# Patient Record
Sex: Male | Born: 1935 | Race: White | Hispanic: No | Marital: Married | Smoking: Former smoker
Health system: Southern US, Community
[De-identification: ages and names within clinical notes are randomized; demographics above are authoritative.]

## PROBLEM LIST (undated history)

## (undated) DIAGNOSIS — K219 Gastro-esophageal reflux disease without esophagitis: Secondary | ICD-10-CM

## (undated) DIAGNOSIS — H3530 Unspecified macular degeneration: Secondary | ICD-10-CM

## (undated) DIAGNOSIS — C801 Malignant (primary) neoplasm, unspecified: Secondary | ICD-10-CM

## (undated) DIAGNOSIS — G9389 Other specified disorders of brain: Secondary | ICD-10-CM

## (undated) DIAGNOSIS — J189 Pneumonia, unspecified organism: Secondary | ICD-10-CM

## (undated) DIAGNOSIS — I2510 Atherosclerotic heart disease of native coronary artery without angina pectoris: Secondary | ICD-10-CM

## (undated) DIAGNOSIS — C3492 Malignant neoplasm of unspecified part of left bronchus or lung: Secondary | ICD-10-CM

## (undated) DIAGNOSIS — I10 Essential (primary) hypertension: Secondary | ICD-10-CM

## (undated) DIAGNOSIS — S069X9A Unspecified intracranial injury with loss of consciousness of unspecified duration, initial encounter: Secondary | ICD-10-CM

## (undated) DIAGNOSIS — I219 Acute myocardial infarction, unspecified: Secondary | ICD-10-CM

## (undated) DIAGNOSIS — E119 Type 2 diabetes mellitus without complications: Secondary | ICD-10-CM

## (undated) DIAGNOSIS — N400 Enlarged prostate without lower urinary tract symptoms: Secondary | ICD-10-CM

## (undated) DIAGNOSIS — R222 Localized swelling, mass and lump, trunk: Secondary | ICD-10-CM

## (undated) DIAGNOSIS — E785 Hyperlipidemia, unspecified: Secondary | ICD-10-CM

## (undated) DIAGNOSIS — M1990 Unspecified osteoarthritis, unspecified site: Secondary | ICD-10-CM

## (undated) HISTORY — DX: Malignant neoplasm of unspecified part of left bronchus or lung: C34.92

## (undated) HISTORY — PX: CORONARY ARTERY BYPASS GRAFT: SHX141

## (undated) HISTORY — DX: Gastro-esophageal reflux disease without esophagitis: K21.9

## (undated) HISTORY — DX: Other specified disorders of brain: G93.89

## (undated) HISTORY — PX: COLONOSCOPY W/ POLYPECTOMY: SHX1380

## (undated) HISTORY — DX: Benign prostatic hyperplasia without lower urinary tract symptoms: N40.0

## (undated) HISTORY — DX: Type 2 diabetes mellitus without complications: E11.9

## (undated) HISTORY — DX: Acute myocardial infarction, unspecified: I21.9

## (undated) HISTORY — DX: Essential (primary) hypertension: I10

## (undated) HISTORY — DX: Hyperlipidemia, unspecified: E78.5

## (undated) HISTORY — PX: CARDIAC CATHETERIZATION: SHX172

## (undated) HISTORY — DX: Localized swelling, mass and lump, trunk: R22.2

## (undated) HISTORY — DX: Atherosclerotic heart disease of native coronary artery without angina pectoris: I25.10

---

## 2007-07-23 ENCOUNTER — Ambulatory Visit: Payer: Self-pay | Admitting: Cardiothoracic Surgery

## 2007-08-06 ENCOUNTER — Ambulatory Visit: Payer: Self-pay | Admitting: Thoracic Surgery (Cardiothoracic Vascular Surgery)

## 2007-09-05 ENCOUNTER — Ambulatory Visit: Payer: Self-pay | Admitting: Thoracic Surgery (Cardiothoracic Vascular Surgery)

## 2015-05-31 DIAGNOSIS — E1142 Type 2 diabetes mellitus with diabetic polyneuropathy: Secondary | ICD-10-CM | POA: Insufficient documentation

## 2015-05-31 DIAGNOSIS — K219 Gastro-esophageal reflux disease without esophagitis: Secondary | ICD-10-CM | POA: Insufficient documentation

## 2015-05-31 DIAGNOSIS — I2510 Atherosclerotic heart disease of native coronary artery without angina pectoris: Secondary | ICD-10-CM | POA: Insufficient documentation

## 2015-05-31 DIAGNOSIS — J439 Emphysema, unspecified: Secondary | ICD-10-CM | POA: Insufficient documentation

## 2016-08-03 DIAGNOSIS — R278 Other lack of coordination: Secondary | ICD-10-CM | POA: Insufficient documentation

## 2016-08-04 DIAGNOSIS — E119 Type 2 diabetes mellitus without complications: Secondary | ICD-10-CM | POA: Insufficient documentation

## 2016-08-04 DIAGNOSIS — C7931 Secondary malignant neoplasm of brain: Secondary | ICD-10-CM | POA: Insufficient documentation

## 2016-08-04 DIAGNOSIS — I10 Essential (primary) hypertension: Secondary | ICD-10-CM | POA: Insufficient documentation

## 2016-08-04 DIAGNOSIS — E785 Hyperlipidemia, unspecified: Secondary | ICD-10-CM | POA: Insufficient documentation

## 2016-08-05 HISTORY — PX: CRANIECTOMY FOR EXCISION OF BRAIN TUMOR INFRATENTORIAL / POSTERIOR FOSSA: SUR325

## 2016-08-07 HISTORY — PX: FLEXIBLE BRONCHOSCOPY: SUR164

## 2016-08-08 DIAGNOSIS — R1312 Dysphagia, oropharyngeal phase: Secondary | ICD-10-CM | POA: Insufficient documentation

## 2016-08-08 DIAGNOSIS — R918 Other nonspecific abnormal finding of lung field: Secondary | ICD-10-CM | POA: Insufficient documentation

## 2016-09-04 DIAGNOSIS — N400 Enlarged prostate without lower urinary tract symptoms: Secondary | ICD-10-CM | POA: Insufficient documentation

## 2016-09-05 DIAGNOSIS — G9340 Encephalopathy, unspecified: Secondary | ICD-10-CM | POA: Insufficient documentation

## 2016-09-09 DIAGNOSIS — R531 Weakness: Secondary | ICD-10-CM | POA: Insufficient documentation

## 2016-09-09 DIAGNOSIS — E871 Hypo-osmolality and hyponatremia: Secondary | ICD-10-CM | POA: Insufficient documentation

## 2016-10-12 ENCOUNTER — Institutional Professional Consult (permissible substitution) (INDEPENDENT_AMBULATORY_CARE_PROVIDER_SITE_OTHER): Payer: Medicare Other | Admitting: Cardiothoracic Surgery

## 2016-10-12 ENCOUNTER — Other Ambulatory Visit: Payer: Self-pay

## 2016-10-12 ENCOUNTER — Other Ambulatory Visit: Payer: Self-pay | Admitting: *Deleted

## 2016-10-12 VITALS — BP 117/66 | HR 48 | Resp 20 | Ht 71.0 in | Wt 190.0 lb

## 2016-10-12 DIAGNOSIS — R918 Other nonspecific abnormal finding of lung field: Secondary | ICD-10-CM | POA: Diagnosis not present

## 2016-10-12 DIAGNOSIS — Z951 Presence of aortocoronary bypass graft: Secondary | ICD-10-CM

## 2016-10-12 NOTE — Progress Notes (Signed)
[address]       [place] [address]             [id number]                    Ever Aziz Kosse Medical Record #[id number] Date of Birth: 1931/04/28  Referring: Vincente Liberty, MD Primary Care: [name], MD  Chief Complaint:    Chief Complaint  Patient presents with  . Lung Mass    Surgical eval PET Scan 09/20/16 @ [facility], Chest/Abd/Pelvis CT 08/04/16 @ [facility], MRI Head 08/06/16 @ [place]    History of Present Illness:    Steve Frank 83 y.o. male is seen in the office  today for Abnormal brain scan and chest scan and PET scan done over the last several months. The patient originally presented to the emergency room in [facility] with ataxia, was found to have a mass in the left cerebellar area. Apparently this was partially resected but no definitive diagnosis was made. The patient continues to have neurologic problems with ataxia difficulty walking. Follow-up CT scan PET scan suggests mediastinal adenopathy. Attempted a needle biopsy of a left supraclavicular node was unsuccessful as there was no ultrasound correlate to biopsy. The patient is referred for consideration of bronchoscopy with ebus. With the patient's poor medical condition he has definite not it candidate for video-assisted thoracoscopy. Previous biopsy done in [facility] did not reveal a tissue diagnosis of what is suspected to be stage IV lung cancer.      Current Activity/ Functional Status:  Patient is not independent with mobility/ambulation, transfers, ADL's, IADL's.   Zubrod Score: At the time of surgery this patient's most appropriate activity status/level should be described as: _0     0    Normal activity, no symptoms _1     1    Restricted in physical strenuous activity but ambulatory, able to do out light work _2     2    Ambulatory and capable of self care, unable to do work activities, up and about               >50 % of waking hours                              _3     3     Only limited self care, in bed greater than 50% of waking hours _4     4    Completely disabled, no self care, confined to bed or chair _5     5    Moribund   Past Medical History:  Diagnosis Date  . BPH (benign prostatic hyperplasia)   . CAD (coronary artery disease)    s/p 2 cabg's  . Cerebellar mass   . Chest mass   . Diabetes ([place])   . GERD (gastroesophageal reflux disease)   . Heart attack ([place])   . HLD (hyperlipidemia)   . Hypertension     Past Surgical History:  Procedure Laterality Date  . CORONARY ARTERY BYPASS GRAFT     Dr Prescott [facility] 07/2007  . CORONARY ARTERY BYPASS GRAFT     1st one was @ [facility]years ago 12/1991  . CRANIECTOMY FOR EXCISION OF BRAIN TUMOR INFRATENTORIAL / POSTERIOR FOSSA  08/05/2016   Dr Rebecka Apley  @ [facility]/Neurosurgery  . FLEXIBLE BRONCHOSCOPY  08/07/2016   Dr Gwenevere Ghazi    No family history on  file.  Social History   Social History  . Marital status: Married    Spouse name: N/A  . Number of children: 4  . Years of education: N/A   Occupational History  . retired    Social History Main Topics  . Smoking status: Former Research scientist (life sciences)  . Smokeless tobacco: Former Systems developer     Comment: Quit 31 years ago  . Alcohol use No  . Drug use: No  . Sexual activity: Not Currently   Other Topics Concern  . Not on file   Social History Narrative  . No narrative on file    History  Smoking Status  . Former Smoker  Smokeless Tobacco  . Former Systems developer    Comment: Quit 31 years ago    History  Alcohol Use No     Allergies  Allergen Reactions  . Ciprofloxacin Nausea And Vomiting    Current Outpatient Prescriptions  Medication Sig Dispense Refill  . acetaminophen (TYLENOL) 325 MG tablet Take 650 mg by mouth every 6 (six) hours as needed (for pain/headaches.).     Marland Kitchen albuterol (PROVENTIL HFA;VENTOLIN HFA) 108 (90 Base) MCG/ACT inhaler Inhale 2 puffs into the lungs every 6 (six) hours as needed (for wheezing/shortness of breath).       Marland Kitchen amLODipine (NORVASC) 5 MG tablet Take 5 mg by mouth daily.     . carvedilol (COREG) 25 MG tablet Take 25 mg by mouth 2 (two) times daily with a meal.     . Cholecalciferol (VITAMIN D-1000 MAX ST) 1000 units tablet Take 1,000 Units by mouth daily.     . furosemide (LASIX) 20 MG tablet Take 20 mg by mouth daily.  0  . HUMALOG 100 UNIT/ML injection INJECT 2 TO 0.12MLS (2-8 UNITS) UNDER THE SKIN 3 TIMES A DAY BEFORE MEALS, AS PER SLIDING SCALE  0  . LANTUS 100 UNIT/ML injection INJECT 0.15ML (15 UNITS TOTAL) UNDER THE SKIN DAILY AT BEDTIME.  0  . mirtazapine (REMERON) 15 MG tablet Take 15 mg by mouth at bedtime.  0  . omeprazole (PRILOSEC) 20 MG capsule Take 20 mg by mouth at bedtime.     . polyethylene glycol (MIRALAX / GLYCOLAX) packet Take 17 g by mouth daily as needed (for constipation.).     Marland Kitchen atorvastatin (LIPITOR) 10 MG tablet take 1 tablet by mouth every evening    . clotrimazole-betamethasone (LOTRISONE) cream Apply 1 application topically 2 (two) times daily.    Marland Kitchen docusate sodium (COLACE) 100 MG capsule Take 100 mg by mouth 2 (two) times daily as needed (for constipation.).    Marland Kitchen gabapentin (NEURONTIN) 400 MG capsule Take 400 mg by mouth at bedtime.    . magnesium hydroxide (MILK OF MAGNESIA) 400 MG/5ML suspension Take 30 mLs by mouth daily as needed (for constipation.).    Marland Kitchen Multiple Vitamin (MULTIVITAMIN WITH MINERALS) TABS tablet Take 1 tablet by mouth daily.    Marland Kitchen neomycin-bacitracin-polymyxin (NEOSPORIN) [id number] ointment Apply 1 application topically at bedtime.    . tamsulosin (FLOMAX) 0.4 MG CAPS capsule Take 0.4 mg by mouth at bedtime.     No current facility-administered medications for this visit.     Pertinent items are noted in HPI.   Review of Systems:     Cardiac Review of Systems: Y or N  Chest Pain [    ]  Resting SOB [   ] Exertional SOB  [  ]  Orthopnea [  ]   Pedal Edema [   ]  Palpitations [  ] Syncope  [  ]   Presyncope [   ]  General Review of Systems:  [Y] = yes [  ]=no Constitional: recent weight change [  ];  Wt loss over the last 3 months [   ] anorexia [  ]; fatigue [  ]; nausea [  ]; night sweats [  ]; fever [  ]; or chills [  ];          Dental: poor dentition[  ]; Last Dentist visit:   Eye : blurred vision [  ]; diplopia [   ]; vision changes [  ];  Amaurosis fugax[  ]; Resp: cough [  ];  wheezing[  ];  hemoptysis[  ]; shortness of breath[  ]; paroxysmal nocturnal dyspnea[  ]; dyspnea on exertion[  ]; or orthopnea[  ];  GI:  gallstones[  ], vomiting[  ];  dysphagia[  ]; melena[  ];  hematochezia [  ]; heartburn[  ];   Hx of  Colonoscopy[  ]; GU: kidney stones [  ]; hematuria[  ];   dysuria [  ];  nocturia[  ];  history of     obstruction [  ]; urinary frequency [  ]             Skin: rash, swelling[  ];, hair loss[  ];  peripheral edema[  ];  or itching[  ]; Musculosketetal: myalgias[  ];  joint swelling[  ];  joint erythema[  ];  joint pain[  ];  back pain[  ];  Heme/Lymph: bruising[  ];  bleeding[  ];  anemia[  ];  Neuro: TIA[  ];  headaches[  ];  stroke[  ];  vertigo[  ];  seizures[  ];   paresthesias[  ];  difficulty walking[  ];  Psych:depression[  ]; anxiety[  ];  Endocrine: diabetes[  ];  thyroid dysfunction[  ];  Immunizations: Flu up to date [  ]; Pneumococcal up to date [  ];  Other:  Physical Exam: BP 117/66   Pulse (!) 48   Resp 20   Ht _0  (1.803 m)   Wt 190 lb (86.2 kg)   SpO2 96% Comment: RA  BMI 26.50 kg/m   PHYSICAL EXAMINATION: General appearance: alert, cooperative, appears older than stated age and slowed mentation Head: Normocephalic, without obvious abnormality, atraumatic Neck: no adenopathy, no carotid bruit, no JVD, supple, symmetrical, trachea midline and thyroid not enlarged, symmetric, no tenderness/mass/nodules Lymph nodes: Cervical, supraclavicular, and axillary nodes normal. Resp: diminished breath sounds bibasilar Back: symmetric, no curvature. ROM normal. No CVA tenderness. Cardio: regular  rate and rhythm, S1, S2 normal, no murmur, click, rub or gallop GI: soft, non-tender; bowel sounds normal; no masses,  no organomegaly Extremities: edema Bilateral lower extremity edema left greater than right patient notes this is been chronic Neurologic: Mental status: Alert, oriented, thought content appropriate Motor: Patient has some ataxia is able to walk but only with existence, came into the office in a wheelchair Coordination: Patient is able to stand but with help  Diagnostic Studies & Laboratory data:     Recent Radiology Findings:   CLINICAL DATA: Initial treatment strategy for lung carcinoma.  EXAM: NUCLEAR MEDICINE PET SKULL BASE TO THIGH  TECHNIQUE: 12.6 mCi F-18 FDG was injected intravenously. Full-ring PET imaging was performed from the skull base to thigh after the radiotracer. CT data was obtained and used for attenuation correction and anatomic localization.  FASTING BLOOD GLUCOSE: Value: 114  mg/dl  COMPARISON: PET-CT 09/08/2016  FINDINGS: NECK  Hypermetabolic lesion in the LEFT cerebellum with intense metabolic activity above physiologic brain activity. Craniotomy defect in the LEFT occipital bone  No hypermetabolic lymph nodes in the neck.  CHEST  Hypermetabolic LEFT infrahilar mass measures 2.2 cm with SUV max equal 4.0.  Hypermetabolic subcarinal and RIGHT lower paratracheal adenopathy. RIGHT lower paratracheal lymph node measures 20 mm short axis with SUV max equal 4.9.  Small hypermetabolic LEFT supraclavicular node measures 8 mm (image 88, series 3) with SUV max equal 2.3.  RIGHT upper lobe pulmonary nodule measuring 8 mm and does not have associated metabolic activity  Small bilateral pleural effusions.  ABDOMEN/PELVIS  No abnormal hypermetabolic activity within the liver, pancreas, adrenal glands, or spleen. No hypermetabolic lymph nodes in the abdomen or pelvis. Prostate grossly normal.  SKELETON  No focal hypermetabolic  activity to suggest skeletal metastasis.  IMPRESSION: 1. Hypermetabolic LEFT infrahilar mass. 2. Hypermetabolic mediastinal lymph nodes including contralateral RIGHT lower paratracheal lymph node. 3. Small hypermetabolic LEFT supraclavicular lymph node. 4. Hypermetabolic LEFT cerebellar brain metastasis.   Electronically Signed By: Suzy Bouchard M.D. On: 09/20/2016 17:01  Principal Interpreter Name: Suzy Bouchard Provider ID: [id number]  Final Report  CLINICAL DATA: Hypermetabolic LEFT infrahilar mass. Hypermetabolic mediastinal lymph nodes including contralateral RIGHT lower paratracheal lymph node. Small hypermetabolic LEFT supraclavicular lymph node. Hypermetabolic LEFT cerebellar brain metastasis.  EXAM: ULTRASOUND OF HEAD/NECK SOFT TISSUES  TECHNIQUE: Ultrasound examination of the head and neck soft tissues was performed in the area of clinical concern in anticipation of percutaneous biopsy.  COMPARISON: PET-CT 09/20/2016  FINDINGS: No definite correlate on ultrasound for the 0.8 cm left supraclavicular hypermetabolic focus seen on PET-CT. No definite pathologic adenopathy identified. Percutaneous biopsy was therefore deferred.  IMPRESSION: 1. No ultrasound correlate for left supraclavicular hypermetabolic focus on PET-CT; biopsy deferred. The findings were discussed with the patient and his spouse.   Electronically Signed By: Lucrezia Europe M.D. On: 09/30/2016 09:03  Principal Interpreter Name: Lucrezia Europe Provider ID: [email or web address] [id number] CLINICAL DATA: Followup cerebellar tumor resection  EXAM: MRI HEAD WITHOUT AND WITH CONTRAST  TECHNIQUE: Multiplanar, multiecho pulse sequences of the brain and surrounding structures were obtained without and with intravenous contrast.  CONTRAST: 18 cc MultiHance  COMPARISON: No comparison imaging available.  FINDINGS: Brain: Recent left occipital craniotomy. Resection defect present in the posterior periphery of the left  cerebellar hemisphere. Blood products along the margin of the resection but no evidence of enhancement at the margin. Round enhancing mass measuring 2.2 cm in diameter anterior to the resection in the superior left cerebellum. Vasogenic edema within the left cerebellum that could be a combination of postsurgical edema and edema related to the residual mass. Slight mass-effect upon the fourth ventricle but no ventricular obstruction. No significant extra-axial collection or unexpected extra-axial collection. Cerebral hemispheres show chronic small-vessel ischemic changes of the deep white matter but note tumor.  Vascular: Major vessels at the base of the brain show flow.  Skull and upper cervical spine: Negative  Sinuses/Orbits: Clear/normal  Other: None significant  IMPRESSION: Left occipital craniotomy with resection defect in the posterior left cerebellum. Blood products at the resection site but no marginal enhancement at the site of resection.  2.2 cm in diameter enhancing intra-axial mass lesion anterior to the resection.  Vasogenic edema in the left cerebellum probably secondary to both the surgery in the residual tumor. Mass-effect upon the fourth ventricle but no ventricular obstruction at this time.   Electronically Signed By: Nelson Chimes  M.D. On: 08/06/2016 12:29  Principal Interpreter Name: Nelson Chimes Provider ID: [id number] CLINICAL DATA: Altered mental status. Nausea and vomiting. Recent cerebellar tumor resection.  EXAM: CT HEAD WITHOUT CONTRAST  TECHNIQUE: Contiguous axial images were obtained from the base of the skull through the vertex without intravenous contrast.  COMPARISON: Brain MR dated 08/06/2016.  FINDINGS: Brain: 2.0 x 1.9 cm ring-like area of increased density in the left cerebellum on image number 10 of series 2, with central low density. This is in a region of previous residual enhancement. Low density in the adjacent portions of  the cerebellum. Diffusely enlarged ventricles and subarachnoid spaces. Patchy white matter low density in both cerebral hemispheres. No intracranial hemorrhage or mass effect. No evidence of acute infarction.  Vascular: No hyperdense vessel or unexpected calcification.  Skull: Left occipital post craniectomy changes.  Sinuses/Orbits: Unremarkable.  Other: Tiny linear anterior frontal interhemispheric lipoma.  IMPRESSION: 1. No acute abnormality. 2. Stable residual tumor in the left cerebellum with associated vasogenic and postoperative edema. 3. Mild diffuse cerebral and cerebellar atrophy. 4. Moderate chronic small vessel white matter ischemic changes in both cerebral hemispheres.   Electronically Signed By: Claudie Revering M.D. On: 09/08/2016 22:27  A. LEFT CEREBELLAR MASS, BIOPSY:  CEREBELLAR TISSUE WITH GLIOSIS, PER OUTSIDE CONSULTANT.  NEGATIVE FOR MALIGNANCY.  B. LEFT CEREBELLAR MASS, BIOPSY:  CEREBELLAR TISSUE WITH GLIOSIS, PER OUTSIDE CONSULTANT.  SPECIMEN ALSO INCLUDES ABUNDANT BLOOD AND FRAGMENTS OF BONE.  NEGATIVE FOR MALIGNANCY.  C. LEFT CEREBELLAR MASS, BIOPSY:  CEREBELLAR TISSUE WITH GLIOSIS, PER OUTSIDE CONSULTANT.  NEGATIVE FOR MALIGNANCY.      P342, 307 X2, 189    Comment The frozen section slide on specimen A revealed numerous small blue cells with focal necrosis, such that a lymphoid lesion could not be ruled out. Flow cytometry was performed on specimen B (see [facility] report) and it showed too few lymphocytes for adequate analysis indicating that the small blue cells represent cerebellar granule cells. The slides on specimens A, B and C were submitted for consultation to Dr. Letta Kocher a neuropathologist at [facility]. He interpreted the findings as cerebellar tissue with gliosis and no evidence of malignancy (see consultation report). The submitted brain biopsies may not be representative of the  cerebellar mass and further evaluation is recommended if clinically indicated.   Clinical History Pre-op diagnosis: Cerebellar tumor   OR Consultation Diagnosis LABELED "LEFT CEREBELLAR MASS", BIOPSY FOR FROZEN SECTION:  NUMEROUS SMALL BLUE CELLS WITH FOCAL NECROSIS.   LYMPHOID LESION CANNOT BE RULED OUT.   ADDITIONAL FRESH TISSUE FOR FLOW CYTOMETRY REQUESTED.   Frozen section report called to Dr. Katherine Roan in the [place] by Dr. Birdena Crandall on 08/05/16 at 6:45 pm.    Gross Description A. Received fresh for intraoperative frozen section labeled with the patient's name on the container and labeled " left cerebellar mass" consists of a 0.4 x 0.3 x 0.1 cm red-tan soft tissue fragment. 2 touch imprints are prepared. The fragment is submitted as received in A1, initially for frozen section. 1-1  B. Received fresh for flow cytometry labeled with the patient's name on the container and labeled "left cerebellar mass" consists of red-tan soft tissue fragments ranging from 0.3-0.5 cm in greatest dimension. Two of the fragments are submitted in RPMI media and sent for flow cytometry studies. The remaining fragments are submitted in B1-B2.  Also submitted labeled with the patient's name on the container and labeled "left cerebellar mass" is a 700 mL of  bloody watery fluid containing clotted material and sediment. The fluid is filtered and representative fragments are submitted in B3-B6. 6-none given  C. Received in formalin labeled with the patient's name on the container and labeled "left cerebellar mass" are 2 black irregular soft tissue fragments measuring 1.0 x 0.5 x 0.2 cm and 1.5 x 0.7 x 0.3 cm. Fragments are submitted as received in C1. 1-2  DG    A. LUNG, LEFT, BRONCHIAL WASHING:  RESPIRATORY EPITHELIAL CELLS WITH BACKGROUND CHRONIC INFLAMMATORY CELLS  AND RED BLOOD CELLS.  B. LUNG, LEFT, BRONCHIAL BRUSHING:  BRONCHIAL EPITHELIAL CELLS WITH SCATTERED INFLAMMATORY CELLS.  P305, 112, 104    Gross  Description A. Received labeled "left bronchial lavage " consists of 40 mL of red cloudy watery fluid.  ThinPrep slides and cell block are prepared.    B. Received labeled "left bronchial brush " consists of 2 previously prepared alcohol fixed smears which will be stained for evaluation.   DG       I have independently reviewed the above radiologic studies.    Recent Lab Findings: No results found for: WBC, HGB, HCT, PLT, GLUCOSE, CHOL, TRIG, HDL, LDLDIRECT, LDLCALC, ALT, AST, NA, K, CL, CREATININE, BUN, CO2, TSH, INR, GLUF, HGBA1C    Assessment / Plan:   Patient is an 83 year old male with known previous history of coronary artery disease having had bypass surgery twice in the past most recently 2008. He [facility] to [facility] and June with ataxia symptoms. Further evaluation including partial resection of a left cerebellar tumor leaves [place] without any definitive tissue diagnosis, but CT scan and PET scan highly suggestive of stage IV lung carcinoma, possibly small cell. I discussed this with the patient and his wife. He is interested in pursuing further treatment if appropriate and for this reason we will proceed with bronchoscopy with ebus and try to obtain a tissue diagnosis from his mediastinal lymph nodes. I discussed bronchoscopy with ebus with the patient including risks involved and he is willing to proceed.      I  spent 40 minutes counseling the patient face to face and 50% or more the  time was spent in counseling and coordination of care. The total time spent in the appointment was 60 minutes.  Grace Isaac MD      [address] [place] [address] Office [phone]   Beeper [phone]  10/13/2016 9:54 AM

## 2016-10-13 ENCOUNTER — Telehealth: Payer: Self-pay | Admitting: *Deleted

## 2016-10-13 ENCOUNTER — Encounter (HOSPITAL_COMMUNITY): Payer: Self-pay | Admitting: *Deleted

## 2016-10-13 DIAGNOSIS — R918 Other nonspecific abnormal finding of lung field: Secondary | ICD-10-CM

## 2016-10-13 NOTE — Anesthesia Preprocedure Evaluation (Addendum)
Anesthesia Evaluation  Patient identified by MRN, date of birth, ID band Patient awake    Reviewed: Allergy & Precautions, NPO status , Patient's Chart, lab work & pertinent test results  Airway Mallampati: III  TM Distance: <3 FB Neck ROM: Full    Dental  (+) Teeth Intact, Dental Advisory Given   Pulmonary COPD, former smoker,     + decreased breath sounds      Cardiovascular hypertension, + CAD, + Past MI and + CABG   Rhythm:Regular Rate:Normal     Neuro/Psych  Neuromuscular disease    GI/Hepatic Neg liver ROS, GERD  ,  Endo/Other  diabetes, Type 2  Renal/GU negative Renal ROS     Musculoskeletal  (+) Arthritis ,   Abdominal   Peds  Hematology negative hematology ROS (+)   Anesthesia Other Findings Day of surgery medications reviewed with the patient.  Reproductive/Obstetrics                            Anesthesia Physical Anesthesia Plan  ASA: III  Anesthesia Plan: General   Post-op Pain Management:    Induction: Intravenous  PONV Risk Score and Plan: 2 and Ondansetron and Dexamethasone  Airway Management Planned: Oral ETT  Additional Equipment:   Intra-op Plan:   Post-operative Plan: Extubation in OR  Informed Consent: I have reviewed the patients History and Physical, chart, labs and discussed the procedure including the risks, benefits and alternatives for the proposed anesthesia with the patient or authorized representative who has indicated his/her understanding and acceptance.   Dental advisory given  Plan Discussed with: CRNA  Anesthesia Plan Comments:         Anesthesia Quick Evaluation

## 2016-10-13 NOTE — Telephone Encounter (Signed)
Oncology Nurse Navigator Documentation  Oncology Nurse Navigator Flowsheets 10/13/2016  Navigator Location [facility]  Referral date to RadOnc/MedOnc 10/12/2016  Navigator Encounter Type Telephone/I received referral on Mr. Zietz.  I called to schedule for clinic. I spoke to him and he requested I speak with wife. I updated on appt.   Telephone Outgoing Call  Treatment Phase Abnormal Scans  Barriers/Navigation Needs Coordination of Care;Education  Interventions Coordination of Care;Education  Coordination of Care Appts  Education Method Verbal  Acuity Level 1  Acuity Level 1 Initial guidance, education and coordination as needed

## 2016-10-13 NOTE — Progress Notes (Signed)
Mr shamarion coots me to speak to his wife for [facility] call.  Mrs Lafond reports that patient denies chest pain, he does get short of breath with much ambulation.  Per Mrs Hands, patients CBGs run 90- 200.  Patient has labs drawn 10/12/16 at Dr Pollock's office, results are not in the computer st this time, I faxed a request to Dr Pollock's office.  I instructed Mrs Casebeer for patient to only take 7 units of Lantus at bedtime and to eat a snack with protein before bedtime. I instructed patient to check CBG to check CBG and if it is less than 70 to treat it with 1/2 cup of clear juice like apple juice or cranberry juice. I instructed patient to recheck CBG in 15 minutes and if CBG is not greater than 70, to  Call [phone] (pre- op). If it is before pre-op opens to retreat as before and recheck CBG in 15 minutes. I told patient to make note of time that liquid is taken and amount, that surgical time may have to be adjusted.

## 2016-10-14 ENCOUNTER — Ambulatory Visit (HOSPITAL_COMMUNITY): Payer: Medicare Other | Admitting: Anesthesiology

## 2016-10-14 ENCOUNTER — Encounter (HOSPITAL_COMMUNITY)
Admission: RE | Disposition: A | Discharge status: Home / Self Care | Payer: Self-pay | Source: Ambulatory Visit | Attending: Cardiothoracic Surgery

## 2016-10-14 ENCOUNTER — Ambulatory Visit (HOSPITAL_COMMUNITY)
Admission: RE | Admit: 2016-10-14 | Discharge: 2016-10-14 | Disposition: A | Discharge status: Home / Self Care | Payer: Medicare Other | Source: Ambulatory Visit | Attending: Cardiothoracic Surgery | Admitting: Cardiothoracic Surgery

## 2016-10-14 ENCOUNTER — Encounter (HOSPITAL_COMMUNITY): Payer: Self-pay | Admitting: *Deleted

## 2016-10-14 ENCOUNTER — Ambulatory Visit (HOSPITAL_COMMUNITY): Payer: Medicare Other

## 2016-10-14 DIAGNOSIS — Z87891 Personal history of nicotine dependence: Secondary | ICD-10-CM | POA: Insufficient documentation

## 2016-10-14 DIAGNOSIS — C3432 Malignant neoplasm of lower lobe, left bronchus or lung: Secondary | ICD-10-CM | POA: Diagnosis not present

## 2016-10-14 DIAGNOSIS — Z881 Allergy status to other antibiotic agents status: Secondary | ICD-10-CM | POA: Diagnosis not present

## 2016-10-14 DIAGNOSIS — I252 Old myocardial infarction: Secondary | ICD-10-CM | POA: Diagnosis not present

## 2016-10-14 DIAGNOSIS — E785 Hyperlipidemia, unspecified: Secondary | ICD-10-CM | POA: Insufficient documentation

## 2016-10-14 DIAGNOSIS — M1990 Unspecified osteoarthritis, unspecified site: Secondary | ICD-10-CM | POA: Insufficient documentation

## 2016-10-14 DIAGNOSIS — Z951 Presence of aortocoronary bypass graft: Secondary | ICD-10-CM | POA: Insufficient documentation

## 2016-10-14 DIAGNOSIS — I2510 Atherosclerotic heart disease of native coronary artery without angina pectoris: Secondary | ICD-10-CM | POA: Insufficient documentation

## 2016-10-14 DIAGNOSIS — C7931 Secondary malignant neoplasm of brain: Secondary | ICD-10-CM | POA: Insufficient documentation

## 2016-10-14 DIAGNOSIS — Z794 Long term (current) use of insulin: Secondary | ICD-10-CM | POA: Diagnosis not present

## 2016-10-14 DIAGNOSIS — Z79899 Other long term (current) drug therapy: Secondary | ICD-10-CM | POA: Diagnosis not present

## 2016-10-14 DIAGNOSIS — K219 Gastro-esophageal reflux disease without esophagitis: Secondary | ICD-10-CM | POA: Diagnosis not present

## 2016-10-14 DIAGNOSIS — E119 Type 2 diabetes mellitus without complications: Secondary | ICD-10-CM | POA: Insufficient documentation

## 2016-10-14 DIAGNOSIS — Z85828 Personal history of other malignant neoplasm of skin: Secondary | ICD-10-CM | POA: Diagnosis not present

## 2016-10-14 DIAGNOSIS — I10 Essential (primary) hypertension: Secondary | ICD-10-CM | POA: Insufficient documentation

## 2016-10-14 DIAGNOSIS — R918 Other nonspecific abnormal finding of lung field: Secondary | ICD-10-CM | POA: Diagnosis present

## 2016-10-14 DIAGNOSIS — J449 Chronic obstructive pulmonary disease, unspecified: Secondary | ICD-10-CM | POA: Insufficient documentation

## 2016-10-14 DIAGNOSIS — R590 Localized enlarged lymph nodes: Secondary | ICD-10-CM

## 2016-10-14 HISTORY — DX: Pneumonia, unspecified organism: J18.9

## 2016-10-14 HISTORY — DX: Unspecified intracranial injury with loss of consciousness of unspecified duration, initial encounter: S06.9X9A

## 2016-10-14 HISTORY — PX: VIDEO BRONCHOSCOPY WITH ENDOBRONCHIAL ULTRASOUND: SHX6177

## 2016-10-14 HISTORY — DX: Malignant (primary) neoplasm, unspecified: C80.1

## 2016-10-14 HISTORY — PX: LUNG BIOPSY: SHX5088

## 2016-10-14 HISTORY — DX: Unspecified osteoarthritis, unspecified site: M19.90

## 2016-10-14 LAB — GLUCOSE, CAPILLARY
Glucose-Capillary: 220 mg/dL — ABNORMAL HIGH (ref 65–99)
Glucose-Capillary: 178 mg/dL — ABNORMAL HIGH (ref 65–99)

## 2016-10-14 LAB — COMPREHENSIVE METABOLIC PANEL
ALT: 17 U/L (ref 17–63)
AST: 23 U/L (ref 15–41)
Albumin: 2.7 g/dL — ABNORMAL LOW (ref 3.5–5.0)
Alkaline Phosphatase: 65 U/L (ref 38–126)
Anion gap: 13 (ref 5–15)
BUN: 14 mg/dL (ref 6–20)
CO2: 20 mmol/L — ABNORMAL LOW (ref 22–32)
Calcium: 8.7 mg/dL — ABNORMAL LOW (ref 8.9–10.3)
Chloride: 105 mmol/L (ref 101–111)
Creatinine, Ser: 0.92 mg/dL (ref 0.61–1.24)
GFR calc Af Amer: >60 mL/min (ref >60)
GFR calc non Af Amer: >60 mL/min (ref >60)
Glucose, Bld: 235 mg/dL — ABNORMAL HIGH (ref 65–99)
Potassium: 4.2 mmol/L (ref 3.5–5.1)
Sodium: 138 mmol/L (ref 135–145)
Total Bilirubin: 0.7 mg/dL (ref 0.3–1.2)
Total Protein: 5.9 g/dL — ABNORMAL LOW (ref 6.5–8.1)

## 2016-10-14 LAB — PROTIME-INR
INR: 1.03
Prothrombin Time: 13.4 seconds (ref 11.4–15.2)

## 2016-10-14 LAB — CBC
HCT: 34.3 % — ABNORMAL LOW (ref 39.0–52.0)
Hemoglobin: 10.9 g/dL — ABNORMAL LOW (ref 13.0–17.0)
MCH: 30.3 pg (ref 26.0–34.0)
MCHC: 31.8 g/dL (ref 30.0–36.0)
MCV: 95.3 fL (ref 78.0–100.0)
Platelets: 205 10*3/uL (ref 150–400)
RBC: 3.6 MIL/uL — ABNORMAL LOW (ref 4.22–5.81)
RDW: 18.1 % — ABNORMAL HIGH (ref 11.5–15.5)
WBC: 8.3 10*3/uL (ref 4.0–10.5)

## 2016-10-14 LAB — APTT: aPTT: 28 seconds (ref 24–36)

## 2016-10-14 LAB — HEMOGLOBIN A1C
Hgb A1c MFr Bld: 6.8 % — ABNORMAL HIGH (ref 4.8–5.6)
Mean Plasma Glucose: 148.46 mg/dL

## 2016-10-14 SURGERY — BRONCHOSCOPY, WITH EBUS
Anesthesia: General | Site: Chest

## 2016-10-14 MED ORDER — EPINEPHRINE PF 1 MG/ML IJ SOLN
INTRAMUSCULAR | Status: AC
  Filled 2016-10-14: qty 1

## 2016-10-14 MED ORDER — ROCURONIUM BROMIDE 100 MG/10ML IV SOLN
INTRAVENOUS | Status: DC | PRN
  Administered 2016-10-14: 40 mg via INTRAVENOUS
  Administered 2016-10-14: 10 mg via INTRAVENOUS

## 2016-10-14 MED ORDER — PROPOFOL 10 MG/ML IV BOLUS
INTRAVENOUS | Status: AC
  Filled 2016-10-14: qty 20

## 2016-10-14 MED ORDER — PHENYLEPHRINE 40 MCG/ML (10ML) SYRINGE FOR IV PUSH (FOR BLOOD PRESSURE SUPPORT)
PREFILLED_SYRINGE | INTRAVENOUS | Status: DC | PRN
  Administered 2016-10-14 (×3): 80 ug via INTRAVENOUS

## 2016-10-14 MED ORDER — LIDOCAINE HCL (CARDIAC) 20 MG/ML IV SOLN
INTRAVENOUS | Status: DC | PRN
  Administered 2016-10-14: 100 mg via INTRAVENOUS

## 2016-10-14 MED ORDER — GLYCOPYRROLATE 0.2 MG/ML IJ SOLN
INTRAMUSCULAR | Status: DC | PRN
  Administered 2016-10-14 (×2): .2 mg via INTRAVENOUS

## 2016-10-14 MED ORDER — FENTANYL CITRATE (PF) 100 MCG/2ML IJ SOLN: 25.0000 ug | INTRAMUSCULAR | Status: DC | PRN

## 2016-10-14 MED ORDER — LACTATED RINGERS IV SOLN
INTRAVENOUS | Status: DC | PRN
  Administered 2016-10-14: 07:00:00 via INTRAVENOUS

## 2016-10-14 MED ORDER — FENTANYL CITRATE (PF) 100 MCG/2ML IJ SOLN
INTRAMUSCULAR | Status: DC | PRN
  Administered 2016-10-14: 100 ug via INTRAVENOUS

## 2016-10-14 MED ORDER — FENTANYL CITRATE (PF) 250 MCG/5ML IJ SOLN
INTRAMUSCULAR | Status: AC
  Filled 2016-10-14: qty 5

## 2016-10-14 MED ORDER — ONDANSETRON HCL 4 MG/2ML IJ SOLN
INTRAMUSCULAR | Status: DC | PRN
  Administered 2016-10-14: 4 mg via INTRAVENOUS

## 2016-10-14 MED ORDER — PROPOFOL 10 MG/ML IV BOLUS
INTRAVENOUS | Status: DC | PRN
  Administered 2016-10-14: 100 mg via INTRAVENOUS

## 2016-10-14 MED ORDER — 0.9 % SODIUM CHLORIDE (POUR BTL) OPTIME
TOPICAL | Status: DC | PRN
  Administered 2016-10-14: 1000 mL

## 2016-10-14 MED ORDER — DEXAMETHASONE SODIUM PHOSPHATE 10 MG/ML IJ SOLN
INTRAMUSCULAR | Status: DC | PRN
  Administered 2016-10-14: 5 mg via INTRAVENOUS

## 2016-10-14 MED ORDER — SODIUM CHLORIDE 0.9 % IR SOLN
Status: DC | PRN
  Administered 2016-10-14: 07:00:00

## 2016-10-14 MED ORDER — EPHEDRINE SULFATE 50 MG/ML IJ SOLN
INTRAMUSCULAR | Status: DC | PRN
  Administered 2016-10-14 (×2): 5 mg via INTRAVENOUS
  Administered 2016-10-14 (×3): 10 mg via INTRAVENOUS

## 2016-10-14 MED ORDER — SUGAMMADEX SODIUM 200 MG/2ML IV SOLN
INTRAVENOUS | Status: DC | PRN
  Administered 2016-10-14: 160 mg via INTRAVENOUS

## 2016-10-14 SURGICAL SUPPLY — 24 items
BRUSH CYTOL CELLEBRITY 1.5X140 (MISCELLANEOUS) ×2 IMPLANT
CANISTER SUCT 3000ML PPV (MISCELLANEOUS) ×2 IMPLANT
CONT SPEC 4OZ CLIKSEAL STRL BL (MISCELLANEOUS) ×2 IMPLANT
COVER BACK TABLE 60X90IN (DRAPES) ×2 IMPLANT
COVER DOME SNAP 22 D (MISCELLANEOUS) ×2 IMPLANT
FORCEPS BIOP RJ4 1.8 (CUTTING FORCEPS) IMPLANT
GAUZE SPONGE 4X4 12PLY STRL (GAUZE/BANDAGES/DRESSINGS) ×2 IMPLANT
GLOVE BIO SURGEON STRL SZ 6.5 (GLOVE) ×2 IMPLANT
KIT CLEAN ENDO COMPLIANCE (KITS) ×4 IMPLANT
KIT ROOM TURNOVER OR (KITS) ×2 IMPLANT
MARKER SKIN DUAL TIP RULER LAB (MISCELLANEOUS) ×2 IMPLANT
NEEDLE EBUS SONO TIP PENTAX (NEEDLE) ×4 IMPLANT
NEEDLE FILTER BLUNT 18X 1/2SAF (NEEDLE) ×1
NEEDLE FILTER BLUNT 18X1 1/2 (NEEDLE) ×1 IMPLANT
NS IRRIG 1000ML POUR BTL (IV SOLUTION) ×2 IMPLANT
OIL SILICONE PENTAX (PARTS (SERVICE/REPAIRS)) ×2 IMPLANT
PAD ARMBOARD 7.5X6 YLW CONV (MISCELLANEOUS) ×4 IMPLANT
SYR 20CC LL (SYRINGE) ×2 IMPLANT
SYR 20ML ECCENTRIC (SYRINGE) ×2 IMPLANT
SYR 3ML LL SCALE MARK (SYRINGE) ×2 IMPLANT
TOWEL OR 17X24 6PK STRL BLUE (TOWEL DISPOSABLE) ×2 IMPLANT
TRAP SPECIMEN MUCOUS 40CC (MISCELLANEOUS) ×4 IMPLANT
TUBE CONNECTING 20X1/4 (TUBING) ×2 IMPLANT
WATER STERILE IRR 1000ML POUR (IV SOLUTION) ×2 IMPLANT

## 2016-10-14 NOTE — Discharge Instructions (Signed)
Flexible Bronchoscopy, Care After °This sheet gives you information about how to care for yourself after your procedure. Your health care provider may also give you more specific instructions. If you have problems or questions, contact your health care provider. °What can I expect after the procedure? °After the procedure, it is common to have the following symptoms for 24-48 hours: °· A cough that is worse than it was before the procedure. °· A low-grade fever. °· A sore throat or hoarse voice. °· Small streaks of blood in the mucus from your lungs (sputum), if tissue samples were removed (biopsy). ° °Follow these instructions at home: °Eating and drinking °· Do not eat or drink anything (including water) for 2 hours after your procedure, or until your numbing medicine (local anesthetic) has worn off. Having a numb throat increases your risk of burning yourself or choking. °· After your numbness is gone and your cough and gag reflexes have returned, you may start eating only soft foods and slowly drinking liquids. °· The day after the procedure, return to your normal diet. °Driving °· Do not drive for 24 hours if you were given a medicine to help you relax (sedative). °· Do not drive or use heavy machinery while taking prescription pain medicine. °General instructions °· Take over-the-counter and prescription medicines only as told by your health care provider. °· Return to your normal activities as told by your health care provider. Ask your health care provider what activities are safe for you. °· Do not use any products that contain nicotine or tobacco, such as cigarettes and e-cigarettes. If you need help quitting, ask your health care provider. °· Keep all follow-up visits as told by your health care provider. This is important, especially if you had a biopsy taken. °Get help right away if: °· You have shortness of breath that gets worse. °· You become light-headed or feel like you might faint. °· You have  chest pain. °· You cough up more than a small amount of blood. °· The amount of blood you cough up increases. °Summary °· Common symptoms in the 24-48 hours following a flexible bronchoscopy include cough, low-grade fever, sore throat or hoarse voice, and blood-streaked mucus from the lungs (if you had a biopsy). °· Do not eat or drink anything (including water) for 2 hours after your procedure, or until your local anesthetic has worn off. You can return to your normal diet the day after the procedure. °· Get help right away if you develop worsening shortness of breath, have chest pain, become light-headed, or cough up more than a small amount of blood. °This information is not intended to replace advice given to you by your health care provider. Make sure you discuss any questions you have with your health care provider. °Document Released: 08/13/2004 Document Revised: 02/12/2016 Document Reviewed: 02/12/2016 °[employer]vier Interactive Patient Education © 2017 [employer] ° °

## 2016-10-14 NOTE — Anesthesia Procedure Notes (Signed)
Procedure Name: Intubation Date/Time: 10/14/2016 7:45 AM Performed by: Salli Quarry Brennin Durfee Pre-anesthesia Checklist: Patient identified, Emergency Drugs available, Suction available and Patient being monitored Patient Re-evaluated:Patient Re-evaluated prior to induction Oxygen Delivery Method: Circle System Utilized Preoxygenation: Pre-oxygenation with 100% oxygen Induction Type: IV induction Ventilation: Mask ventilation without difficulty Laryngoscope Size: Glidescope and 4 Grade View: Grade I Tube type: Oral Tube size: 9.0 mm Number of attempts: 2 Airway Equipment and Method: Stylet and Video-laryngoscopy Placement Confirmation: ETT inserted through vocal cords under direct vision,  positive ETCO2 and breath sounds checked- equal and bilateral Secured at: 23 cm Tube secured with: Tape Dental Injury: Teeth and Oropharynx as per pre-operative assessment  Difficulty Due To: Difficulty was anticipated, Difficult Airway- due to anterior larynx, Difficult Airway- due to reduced neck mobility and Difficult Airway- due to dentition Future Recommendations: Recommend- induction with short-acting agent, and alternative techniques readily available Comments: DLx1 with MAC 4, grade IV view; VLx1 with glidescope 4, grade I view, atraumatic intubation of 9.0 oral ETT

## 2016-10-14 NOTE — Transfer of Care (Signed)
Immediate Anesthesia Transfer of Care Note  Patient: Steve Frank  Procedure(s) Performed: Procedure(s): VIDEO BRONCHOSCOPY WITH ENDOBRONCHIAL ULTRASOUND (N/A) LUNG BIOPSY (N/A)  Patient Location: PACU  Anesthesia Type:General  Level of Consciousness: awake, alert  and patient cooperative  Airway & Oxygen Therapy: Patient Spontanous Breathing and Patient connected to nasal cannula oxygen  Post-op Assessment: Report given to RN and Post -op Vital signs reviewed and stable  Post vital signs: Reviewed and stable  Last Vitals:  Vitals:   10/14/16 0629  BP: (!) 142/47  Resp: 18  Temp: (!) 36.4 C  SpO2: 99%    Last Pain:  Vitals:   10/14/16 0629  TempSrc: Oral         Complications: No apparent anesthesia complications

## 2016-10-14 NOTE — Anesthesia Postprocedure Evaluation (Signed)
Anesthesia Post Note  Patient: Steve Frank  Procedure(s) Performed: Procedure(s) (LRB): VIDEO BRONCHOSCOPY WITH ENDOBRONCHIAL ULTRASOUND (N/A) LUNG BIOPSY (N/A)     Patient location during evaluation: PACU Anesthesia Type: General Level of consciousness: awake and alert Pain management: pain level controlled Vital Signs Assessment: post-procedure vital signs reviewed and stable Respiratory status: spontaneous breathing, nonlabored ventilation, respiratory function stable and patient connected to nasal cannula oxygen Cardiovascular status: blood pressure returned to baseline and stable Postop Assessment: no signs of nausea or vomiting Anesthetic complications: no    Last Vitals:  Vitals:   10/14/16 0945 10/14/16 1000  BP: (!) 116/52 (!) 118/52  Pulse: (!) 52 (!) 50  Resp: 15 20  Temp:  36.7 C  SpO2: 93% 94%    Last Pain:  Vitals:   10/14/16 0629  TempSrc: Oral                 Effie Berkshire

## 2016-10-14 NOTE — Brief Op Note (Signed)
      [address]       [place] [address]             [phone]     10/14/2016  9:01 AM  PATIENT:  Steve Frank  83 y.o. male  PRE-OPERATIVE DIAGNOSIS:  LUNG MASS  POST-OPERATIVE DIAGNOSIS:  LUNG MASS  PROCEDURE:  Procedure(s): VIDEO BRONCHOSCOPY WITH ENDOBRONCHIAL ULTRASOUND (N/A) LUNG BIOPSY (N/A)  SURGEON:  Surgeon(s) and Role:    * Grace Isaac, MD - Primary   ANESTHESIA:   general  EBL:  Total I/O In: 800 [I.V.:800] Out: 5 [Blood:5]  BLOOD ADMINISTERED:none  DRAINS: none   LOCAL MEDICATIONS USED:  NONE  SPECIMEN:  Source of Specimen:  10L and 4R nodes   DISPOSITION OF SPECIMEN:  PATHOLOGY  COUNTS:  YES  DICTATION: .Dragon Dictation  PLAN OF CARE: Discharge to home after PACU  PATIENT DISPOSITION:  PACU - hemodynamically stable.   Delay start of Pharmacological VTE agent (>24hrs) due to surgical blood loss or risk of bleeding: yes

## 2016-10-14 NOTE — H&P (Signed)
[address]       [place] [address]             [phone]                    Joron J Essner [place] Medical Record #[id number] Date of Birth: [birth year] Referring: Vincente Liberty, MD Primary Care: [name], MD  Chief Complaint:    Chief Complaint  Patient presents with  . Lung Mass    Surgical eval PET Scan 09/20/16 @ [facility], Chest/Abd/Pelvis CT 08/04/16 @ [facility], MRI Head 08/06/16 @ [place]    History of Present Illness:    JUEL BELLEROSE 83 y.o. male is seen in the office for Abnormal brain scan and chest scan and PET scan done over the last several months. The patient originally presented to the emergency room in [facility] with ataxia, was found to have a mass in the left cerebellar area. Apparently this was partially resected but no definitive diagnosis was made. The patient continues to have neurologic problems with ataxia difficulty walking. Follow-up CT scan PET scan suggests mediastinal adenopathy. Attempted a needle biopsy of a left supraclavicular node was unsuccessful as there was no ultrasound correlate to biopsy. The patient is referred for consideration of bronchoscopy with ebus. With the patient's poor medical condition he has definite not it candidate for video-assisted thoracoscopy. Previous bronchoscopy  done in [facility] did not reveal a tissue diagnosis of what is suspected to be stage IV lung cancer.      Current Activity/ Functional Status:  Patient is not independent with mobility/ambulation, transfers, ADL's, IADL's.   Zubrod Score: At the time of surgery this patient's most appropriate activity status/level should be described as: _0     0    Normal activity, no symptoms _1     1    Restricted in physical strenuous activity but ambulatory, able to do out light work _2     2    Ambulatory and capable of self care, unable to do work activities, up and about               >50 % of waking hours                              _3     3     Only limited self care, in bed greater than 50% of waking hours _4     4    Completely disabled, no self care, confined to bed or chair _5     5    Moribund   Past Medical History:  Diagnosis Date  . Arthritis   . BPH (benign prostatic hyperplasia)   . CAD (coronary artery disease)    s/p 2 cabg's  . Cancer (HCC)    Basal and squamous cell  skin cancer  . Cerebellar mass   . Chest mass   . Diabetes ([place])    Type II  . GERD (gastroesophageal reflux disease)   . Head injury with loss of consciousness [facility])    age 38 or 30-   . Heart attack ([place])   . HLD (hyperlipidemia)   . Hypertension   . Pneumonia     x 2 last 1071    Past Surgical History:  Procedure Laterality Date  . CARDIAC CATHETERIZATION    . COLONOSCOPY W/ POLYPECTOMY    . CORONARY ARTERY BYPASS GRAFT  Dr Prescott Gum [facility] 07/2007  . CORONARY ARTERY BYPASS GRAFT     1st one was @ [facility]years ago 12/1991  . CRANIECTOMY FOR EXCISION OF BRAIN TUMOR INFRATENTORIAL / POSTERIOR FOSSA  08/05/2016   Dr Rebecka Apley  @ [facility]/Neurosurgery  . FLEXIBLE BRONCHOSCOPY  08/07/2016   Dr Gwenevere Ghazi    History reviewed. No pertinent family history.  Social History   Social History  . Marital status: Married    Spouse name: N/A  . Number of children: 4  . Years of education: N/A   Occupational History  . retired    Social History Main Topics  . Smoking status: Former Smoker    Years: 35.00  . Smokeless tobacco: Former Systems developer     Comment: 10/13/16- Quit 31 years ago  . Alcohol use No  . Drug use: No  . Sexual activity: Not Currently   Other Topics Concern  . Not on file   Social History Narrative  . No narrative on file    History  Smoking Status  . Former Smoker  . Years: 35.00  Smokeless Tobacco  . Former Systems developer    Comment: 10/13/16- Quit 31 years ago    History  Alcohol Use No     Allergies  Allergen Reactions  . Ciprofloxacin Nausea And Vomiting    No current facility-administered  medications for this encounter.     Pertinent items are noted in HPI.   Review of Systems:     Cardiac Review of Systems: Y or N  Chest Pain [  n  ]  Resting SOB [  n ] Exertional SOB  Blue.Reese  ]  Orthopnea Florencio.Farrier  ]   Pedal Edema [  y ]    Palpitations Florencio.Farrier  ] Syncope  [ n ]   Presyncope [ n  ]  General Review of Systems: [Y] = yes [  ]=no Constitional: recent weight change [  ];  Wt loss over the last 3 months [   ] anorexia [  ]; fatigue Blue.Reese  ]; nausea [  ]; night sweats [  ]; fever [  ]; or chills [  ];          Dental: poor dentition[  ]; Last Dentist visit:   Eye : blurred vision [  ]; diplopia [   ]; vision changes [  ];  Amaurosis fugax[  ]; Resp: cough Blue.Reese ];  wheezing[  ];  hemoptysis[ n ]; shortness of breath[ n ]; paroxysmal nocturnal dyspnea[  ]; dyspnea on exertion[n  ]; or orthopnea[  ];  GI:  gallstones[  ], vomiting[  ];  dysphagia[  ]; melena[  ];  hematochezia [  ]; heartburn[  ];   Hx of  Colonoscopy[  ]; GU: kidney stones [  ]; hematuria[n  ];   dysuria [  ];  nocturia[ n ];  history of     obstruction [  ]; urinary frequency [ n ]             Skin: rash, swelling[  ];, hair loss[  ];  peripheral edema[  ];  or itching[  ]; Musculosketetal: myalgias[  ];  joint swelling[  ];  joint erythema[  ];  joint pain[  ];  back pain[  ];  Heme/Lymph: bruising[  ];  bleeding[  ];  anemia[  ];  Neuro: TIA[  ];  headaches[ y ];  stroke[ n ];  vertigo[  ];  seizures[  ];  paresthesias[  ];  difficulty walking[ y ];  Psych:depression[  ]; anxiety[  ];  Endocrine: diabetes[ n ];  thyroid dysfunction[ n ];  Immunizations: Flu up to date [n  ]; Pneumococcal up to date Florencio.Farrier  ];  Other:  Physical Exam: BP (!) 142/47   Temp (!) 97.5 F (36.4 C) (Oral)   Resp 18   Wt 190 lb (86.2 kg)   SpO2 99%   BMI 26.50 kg/m   PHYSICAL EXAMINATION: General appearance: alert, cooperative, appears older than stated age and slowed mentation Head: Normocephalic, without obvious abnormality, atraumatic Neck: no  adenopathy, no carotid bruit, no JVD, supple, symmetrical, trachea midline and thyroid not enlarged, symmetric, no tenderness/mass/nodules Lymph nodes: Cervical, supraclavicular, and axillary nodes normal. Resp: diminished breath sounds bibasilar Back: symmetric, no curvature. ROM normal. No CVA tenderness. Cardio: regular rate and rhythm, S1, S2 normal, no murmur, click, rub or gallop GI: soft, non-tender; bowel sounds normal; no masses,  no organomegaly Extremities: edema Bilateral lower extremity edema left greater than right patient notes this is been chronic Neurologic: Mental status: Alert, oriented, thought content appropriate Motor: Patient has some ataxia is able to walk but only with existence, came into the office in a wheelchair Coordination: Patient is able to stand but with help  Diagnostic Studies & Laboratory data:   Lab Results  Component Value Date   WBC 8.3 10/14/2016   HGB 10.9 (L) 10/14/2016   HCT 34.3 (L) 10/14/2016   PLT 205 10/14/2016       Recent Radiology Findings:   CLINICAL DATA: Initial treatment strategy for lung carcinoma.  EXAM: NUCLEAR MEDICINE PET SKULL BASE TO THIGH  TECHNIQUE: 12.6 mCi F-18 FDG was injected intravenously. Full-ring PET imaging was performed from the skull base to thigh after the radiotracer. CT data was obtained and used for attenuation correction and anatomic localization.  FASTING BLOOD GLUCOSE: Value: 114 mg/dl  COMPARISON: PET-CT 09/08/2016  FINDINGS: NECK  Hypermetabolic lesion in the LEFT cerebellum with intense metabolic activity above physiologic brain activity. Craniotomy defect in the LEFT occipital bone  No hypermetabolic lymph nodes in the neck.  CHEST  Hypermetabolic LEFT infrahilar mass measures 2.2 cm with SUV max equal 4.0.  Hypermetabolic subcarinal and RIGHT lower paratracheal adenopathy. RIGHT lower paratracheal lymph node measures 20 mm short axis with SUV max equal 4.9.  Small  hypermetabolic LEFT supraclavicular node measures 8 mm (image 88, series 3) with SUV max equal 2.3.  RIGHT upper lobe pulmonary nodule measuring 8 mm and does not have associated metabolic activity  Small bilateral pleural effusions.  ABDOMEN/PELVIS  No abnormal hypermetabolic activity within the liver, pancreas, adrenal glands, or spleen. No hypermetabolic lymph nodes in the abdomen or pelvis. Prostate grossly normal.  SKELETON  No focal hypermetabolic activity to suggest skeletal metastasis.  IMPRESSION: 1. Hypermetabolic LEFT infrahilar mass. 2. Hypermetabolic mediastinal lymph nodes including contralateral RIGHT lower paratracheal lymph node. 3. Small hypermetabolic LEFT supraclavicular lymph node. 4. Hypermetabolic LEFT cerebellar brain metastasis.   Electronically Signed By: Suzy Bouchard M.D. On: 09/20/2016 17:01  Principal Interpreter Name: Suzy Bouchard Provider ID: [id number]  Final Report  CLINICAL DATA: Hypermetabolic LEFT infrahilar mass. Hypermetabolic mediastinal lymph nodes including contralateral RIGHT lower paratracheal lymph node. Small hypermetabolic LEFT supraclavicular lymph node. Hypermetabolic LEFT cerebellar brain metastasis.  EXAM: ULTRASOUND OF HEAD/NECK SOFT TISSUES  TECHNIQUE: Ultrasound examination of the head and neck soft tissues was performed in the area of clinical concern in anticipation of percutaneous biopsy.  COMPARISON: PET-CT 09/20/2016  FINDINGS:  No definite correlate on ultrasound for the 0.8 cm left supraclavicular hypermetabolic focus seen on PET-CT. No definite pathologic adenopathy identified. Percutaneous biopsy was therefore deferred.  IMPRESSION: 1. No ultrasound correlate for left supraclavicular hypermetabolic focus on PET-CT; biopsy deferred. The findings were discussed with the patient and his spouse.   Electronically Signed By: Lucrezia Europe M.D. On: 09/30/2016 09:03  Principal Interpreter Name: Lucrezia Europe Provider ID: Rico [id number] CLINICAL DATA: Followup cerebellar tumor resection  EXAM: MRI HEAD WITHOUT AND WITH CONTRAST  TECHNIQUE: Multiplanar, multiecho pulse sequences of the brain and surrounding structures were obtained without and with intravenous contrast.  CONTRAST: 18 cc MultiHance  COMPARISON: No comparison imaging available.  FINDINGS: Brain: Recent left occipital craniotomy. Resection defect present in the posterior periphery of the left cerebellar hemisphere. Blood products along the margin of the resection but no evidence of enhancement at the margin. Round enhancing mass measuring 2.2 cm in diameter anterior to the resection in the superior left cerebellum. Vasogenic edema within the left cerebellum that could be a combination of postsurgical edema and edema related to the residual mass. Slight mass-effect upon the fourth ventricle but no ventricular obstruction. No significant extra-axial collection or unexpected extra-axial collection. Cerebral hemispheres show chronic small-vessel ischemic changes of the deep white matter but note tumor.  Vascular: Major vessels at the base of the brain show flow.  Skull and upper cervical spine: Negative  Sinuses/Orbits: Clear/normal  Other: None significant  IMPRESSION: Left occipital craniotomy with resection defect in the posterior left cerebellum. Blood products at the resection site but no marginal enhancement at the site of resection.  2.2 cm in diameter enhancing intra-axial mass lesion anterior to the resection.  Vasogenic edema in the left cerebellum probably secondary to both the surgery in the residual tumor. Mass-effect upon the fourth ventricle but no ventricular obstruction at this time.   Electronically Signed By: Nelson Chimes M.D. On: 08/06/2016 12:29  Principal Interpreter Name: Nelson Chimes Provider ID: [id number] CLINICAL DATA: Altered mental status. Nausea and vomiting. Recent cerebellar  tumor resection.  EXAM: CT HEAD WITHOUT CONTRAST  TECHNIQUE: Contiguous axial images were obtained from the base of the skull through the vertex without intravenous contrast.  COMPARISON: Brain MR dated 08/06/2016.  FINDINGS: Brain: 2.0 x 1.9 cm ring-like area of increased density in the left cerebellum on image number 10 of series 2, with central low density. This is in a region of previous residual enhancement. Low density in the adjacent portions of the cerebellum. Diffusely enlarged ventricles and subarachnoid spaces. Patchy white matter low density in both cerebral hemispheres. No intracranial hemorrhage or mass effect. No evidence of acute infarction.  Vascular: No hyperdense vessel or unexpected calcification.  Skull: Left occipital post craniectomy changes.  Sinuses/Orbits: Unremarkable.  Other: Tiny linear anterior frontal interhemispheric lipoma.  IMPRESSION: 1. No acute abnormality. 2. Stable residual tumor in the left cerebellum with associated vasogenic and postoperative edema. 3. Mild diffuse cerebral and cerebellar atrophy. 4. Moderate chronic small vessel white matter ischemic changes in both cerebral hemispheres.   Electronically Signed By: Claudie Revering M.D. On: 09/08/2016 22:27  A. LEFT CEREBELLAR MASS, BIOPSY:  CEREBELLAR TISSUE WITH GLIOSIS, PER OUTSIDE CONSULTANT.  NEGATIVE FOR MALIGNANCY.  B. LEFT CEREBELLAR MASS, BIOPSY:  CEREBELLAR TISSUE WITH GLIOSIS, PER OUTSIDE CONSULTANT.  SPECIMEN ALSO INCLUDES ABUNDANT BLOOD AND FRAGMENTS OF BONE.  NEGATIVE FOR MALIGNANCY.  C. LEFT CEREBELLAR MASS, BIOPSY:  CEREBELLAR TISSUE WITH GLIOSIS, PER OUTSIDE CONSULTANT.  NEGATIVE FOR MALIGNANCY.      P342,  307 X2, 189    Comment The frozen section slide on specimen A revealed numerous small blue cells with focal necrosis, such that a lymphoid lesion could not be ruled out. Flow cytometry was performed on specimen B (see [facility] report) and it showed too few lymphocytes for adequate analysis indicating that the small blue cells represent cerebellar granule cells. The slides on specimens A, B and C were submitted for consultation to Dr. Letta Kocher a neuropathologist at [facility]. He interpreted the findings as cerebellar tissue with gliosis and no evidence of malignancy (see consultation report). The submitted brain biopsies may not be representative of the cerebellar mass and further evaluation is recommended if clinically indicated.   Clinical History Pre-op diagnosis: Cerebellar tumor   OR Consultation Diagnosis LABELED "LEFT CEREBELLAR MASS", BIOPSY FOR FROZEN SECTION:  NUMEROUS SMALL BLUE CELLS WITH FOCAL NECROSIS.   LYMPHOID LESION CANNOT BE RULED OUT.   ADDITIONAL FRESH TISSUE FOR FLOW CYTOMETRY REQUESTED.   Frozen section report called to Dr. Katherine Roan in the [place] by Dr. Birdena Crandall on 08/05/16 at 6:45 pm.    Gross Description A. Received fresh for intraoperative frozen section labeled with the patient's name on the container and labeled " left cerebellar mass" consists of a 0.4 x 0.3 x 0.1 cm red-tan soft tissue fragment. 2 touch imprints are prepared. The fragment is submitted as received in A1, initially for frozen section. 1-1  B. Received fresh for flow cytometry labeled with the patient's name on the container and labeled "left cerebellar mass" consists of red-tan soft tissue fragments ranging from 0.3-0.5 cm in greatest dimension. Two of the fragments are submitted in RPMI media and sent for flow cytometry studies. The remaining fragments are submitted in B1-B2.  Also submitted labeled with the patient's name on the container and labeled "left cerebellar mass" is a 700 mL of bloody watery fluid containing clotted material and sediment. The fluid is filtered and representative fragments are submitted in B3-B6. 6-none given  C. Received in formalin labeled with the patient's name on  the container and labeled "left cerebellar mass" are 2 black irregular soft tissue fragments measuring 1.0 x 0.5 x 0.2 cm and 1.5 x 0.7 x 0.3 cm. Fragments are submitted as received in C1. 1-2  DG    A. LUNG, LEFT, BRONCHIAL WASHING:  RESPIRATORY EPITHELIAL CELLS WITH BACKGROUND CHRONIC INFLAMMATORY CELLS  AND RED BLOOD CELLS.  B. LUNG, LEFT, BRONCHIAL BRUSHING:  BRONCHIAL EPITHELIAL CELLS WITH SCATTERED INFLAMMATORY CELLS.  P305, 112, 104    Gross Description A. Received labeled "left bronchial lavage " consists of 40 mL of red cloudy watery fluid.  ThinPrep slides and cell block are prepared.    B. Received labeled "left bronchial brush " consists of 2 previously prepared alcohol fixed smears which will be stained for evaluation.   DG       I have independently reviewed the above radiologic studies.    Recent Lab Findings: Lab Results  Component Value Date   WBC 8.3 10/14/2016   HGB 10.9 (L) 10/14/2016   HCT 34.3 (L) 10/14/2016   PLT 205 10/14/2016      Assessment / Plan:   Patient is an 83 year old male with known previous history of coronary artery disease having had bypass surgery twice in the past most recently 2008. He presented to [facility] and June with ataxia symptoms. Further evaluation including partial resection of a left cerebellar tumor leaves [place] without any definitive  tissue diagnosis, but CT scan and PET scan highly suggestive of stage IV lung carcinoma, possibly small cell. I discussed this with the patient and his wife. He is interested in pursuing further treatment if appropriate and for this reason we will proceed with bronchoscopy with ebus and try to obtain a tissue diagnosis from  mediastinal lymph nodes. I discussed bronchoscopy with ebus with the patient including risks involved and he is willing to proceed.    The goals risks and alternatives of the planned surgical procedure Procedure(s): VIDEO BRONCHOSCOPY WITH ENDOBRONCHIAL ULTRASOUND  (N/A) LUNG BIOPSY (N/A)  have been discussed with the patient in detail. The risks of the procedure including death, infection, stroke, myocardial infarction, bleeding, blood transfusion have all been discussed specifically.  I have quoted Renetta Chalk a 3 % of perioperative mortality and a complication rate as high as 30 %. The patient's questions have been answered.KAHLIN MARK is willing  to proceed with the planned procedure. Grace Isaac MD      [address] [place] [address] Office [phone]   Beeper [id number]  10/14/2016 7:03 AM

## 2016-10-15 ENCOUNTER — Encounter (HOSPITAL_COMMUNITY): Payer: Self-pay | Admitting: Cardiothoracic Surgery

## 2016-10-17 ENCOUNTER — Encounter: Payer: Self-pay | Admitting: Internal Medicine

## 2016-10-17 NOTE — Op Note (Signed)
NAME:  Steve Frank, Steve Frank NO.:  MEDICAL RECORD NO.:  [id number]  LOCATION:                                 FACILITY:  PHYSICIAN:  Lanelle Bal, MD         DATE OF BIRTH:  DATE OF PROCEDURE:  10/14/2016 DATE OF DISCHARGE:                              OPERATIVE REPORT   PREOPERATIVE DIAGNOSES:  Mediastinal adenopathy and brain lesion, suspicious of primary carcinoma of the lung.  POSTOPERATIVE DIAGNOSIS:  Mediastinal adenopathy and brain lesion, suspicious of primary carcinoma of the lung, with preliminary smear consistent with non-small cell lung cancer.  PROCEDURE PERFORMED:  Bronchoscopy with brushings, endobronchial ultrasound with transbronchial biopsy of 4R and 10L nodes.  SURGEON:  Lanelle Bal, MD  BRIEF HISTORY:  The patient is an 83 year old male, who presented to [facility] with ataxia in June of this year.  The patient was evaluated including surgery on the cerebellar lesion, but without any definitive tissue diagnosis.  Bronchoscopy was performed without a tissue diagnosis.  Because of the patient's PET scan with evidence of mediastinal and left hilar adenopathy, he was referred to Thoracic Surgery.  CT scans, MRI of the brain, and PET scan were all reviewed and it was recommended to the patient that we proceed as quickly as possible with bronchoscopy with EBUS to attempt to obtain a tissue diagnosis. The patient agreed and signed informed consent.  DESCRIPTION OF PROCEDURE:  The patient underwent general endotracheal anesthesia without incident.  Appropriate time-out was performed, and we proceeded with bronchoscopy with a 2.8-mm fiberoptic bronchoscope.  The bronchus was examined to the subsegmental level, both the left and right tracheobronchial tree without obvious endobronchial lesions.  Brushings of the left lower lobe, which were somewhat suspicious on the PET scan, were performed.  The bronchoscope was then removed, and  EBUS scope was placed.  We initially placed this scope toward the left mainstem bronchus and through the division in the upper and lower lobes of the left lung.  In this area, enlarged nodes were noted.  Multiple passes with the transbronchial needle aspirations were obtained.  The scope was then also moved to the carina and along the right tracheal area and additional enlarged node was noted here corresponding to the hypermetabolic areas on the PET scan, labeled 4R node.  Multiple passes were made in this node and material sent for cytology and also in satellite for cell block.  The initial pass of the 10L node had no diagnostic material and subsequent passes on pulmonary smear confirmed malignancy, probably non-small cell lung cancer.  The remainder of the specimens were then submitted for permanent.  The tracheobronchial tree was suctioned of all secretions. Blood loss was very trivial.  The scope was removed.  The patient was extubated and transferred to the recovery room having tolerated the procedure without obvious complication.     Lanelle Bal, MD     EG/MEDQ  D:  10/17/2016  T:  10/17/2016  Job:  [id number]

## 2016-10-17 NOTE — Progress Notes (Signed)
Letter sent to patient reference upcoming [facility]

## 2016-10-20 ENCOUNTER — Telehealth: Payer: Self-pay | Admitting: Internal Medicine

## 2016-10-20 ENCOUNTER — Ambulatory Visit (HOSPITAL_BASED_OUTPATIENT_CLINIC_OR_DEPARTMENT_OTHER): Payer: Medicare Other | Admitting: Internal Medicine

## 2016-10-20 ENCOUNTER — Ambulatory Visit: Payer: Medicare Other | Attending: Internal Medicine | Admitting: Physical Therapy

## 2016-10-20 ENCOUNTER — Encounter: Payer: Self-pay | Admitting: Internal Medicine

## 2016-10-20 ENCOUNTER — Encounter: Payer: Self-pay | Admitting: *Deleted

## 2016-10-20 ENCOUNTER — Other Ambulatory Visit: Payer: Self-pay | Admitting: Cardiothoracic Surgery

## 2016-10-20 ENCOUNTER — Ambulatory Visit
Admission: RE | Admit: 2016-10-20 | Discharge: 2016-10-20 | Disposition: A | Discharge status: Home / Self Care | Payer: Medicare Other | Source: Ambulatory Visit | Attending: Radiation Oncology | Admitting: Radiation Oncology

## 2016-10-20 ENCOUNTER — Other Ambulatory Visit (HOSPITAL_BASED_OUTPATIENT_CLINIC_OR_DEPARTMENT_OTHER): Payer: Medicare Other

## 2016-10-20 VITALS — BP 130/45 | HR 48 | Temp 97.2°F | Resp 18 | Ht 71.0 in | Wt 185.9 lb

## 2016-10-20 DIAGNOSIS — R634 Abnormal weight loss: Secondary | ICD-10-CM | POA: Diagnosis not present

## 2016-10-20 DIAGNOSIS — Z7189 Other specified counseling: Secondary | ICD-10-CM

## 2016-10-20 DIAGNOSIS — C801 Malignant (primary) neoplasm, unspecified: Secondary | ICD-10-CM | POA: Diagnosis not present

## 2016-10-20 DIAGNOSIS — R262 Difficulty in walking, not elsewhere classified: Secondary | ICD-10-CM | POA: Diagnosis present

## 2016-10-20 DIAGNOSIS — C7931 Secondary malignant neoplasm of brain: Secondary | ICD-10-CM | POA: Diagnosis present

## 2016-10-20 DIAGNOSIS — Z801 Family history of malignant neoplasm of trachea, bronchus and lung: Secondary | ICD-10-CM

## 2016-10-20 DIAGNOSIS — R591 Generalized enlarged lymph nodes: Secondary | ICD-10-CM

## 2016-10-20 DIAGNOSIS — Z510 Encounter for antineoplastic radiation therapy: Secondary | ICD-10-CM | POA: Insufficient documentation

## 2016-10-20 DIAGNOSIS — R918 Other nonspecific abnormal finding of lung field: Secondary | ICD-10-CM | POA: Diagnosis not present

## 2016-10-20 DIAGNOSIS — M6281 Muscle weakness (generalized): Secondary | ICD-10-CM

## 2016-10-20 DIAGNOSIS — R05 Cough: Secondary | ICD-10-CM

## 2016-10-20 DIAGNOSIS — C3492 Malignant neoplasm of unspecified part of left bronchus or lung: Secondary | ICD-10-CM | POA: Insufficient documentation

## 2016-10-20 DIAGNOSIS — Z9181 History of falling: Secondary | ICD-10-CM | POA: Insufficient documentation

## 2016-10-20 DIAGNOSIS — R531 Weakness: Secondary | ICD-10-CM

## 2016-10-20 DIAGNOSIS — Z87891 Personal history of nicotine dependence: Secondary | ICD-10-CM

## 2016-10-20 DIAGNOSIS — R2681 Unsteadiness on feet: Secondary | ICD-10-CM | POA: Insufficient documentation

## 2016-10-20 DIAGNOSIS — R293 Abnormal posture: Secondary | ICD-10-CM

## 2016-10-20 DIAGNOSIS — Z5111 Encounter for antineoplastic chemotherapy: Secondary | ICD-10-CM

## 2016-10-20 HISTORY — DX: Malignant neoplasm of unspecified part of left bronchus or lung: C34.92

## 2016-10-20 LAB — CBC WITH DIFFERENTIAL/PLATELET
BASO%: 1.1 % (ref 0.0–2.0)
BASOS ABS: 0.1 10*3/uL (ref 0.0–0.1)
EOS ABS: 0.7 10*3/uL — AB (ref 0.0–0.5)
EOS%: 7.1 % — ABNORMAL HIGH (ref 0.0–7.0)
HCT: 35.3 % — ABNORMAL LOW (ref 38.4–49.9)
HGB: 11.7 g/dL — ABNORMAL LOW (ref 13.0–17.1)
LYMPH%: 13.1 % — AB (ref 14.0–49.0)
MCH: 31.3 pg (ref 27.2–33.4)
MCHC: 33 g/dL (ref 32.0–36.0)
MCV: 94.9 fL (ref 79.3–98.0)
MONO#: 0.9 10*3/uL (ref 0.1–0.9)
MONO%: 8.7 % (ref 0.0–14.0)
NEUT%: 70 % (ref 39.0–75.0)
NEUTROS ABS: 6.9 10*3/uL — AB (ref 1.5–6.5)
Platelets: 230 10*3/uL (ref 140–400)
RBC: 3.73 10*6/uL — ABNORMAL LOW (ref 4.20–5.82)
RDW: 17.5 % — ABNORMAL HIGH (ref 11.0–14.6)
WBC: 9.9 10*3/uL (ref 4.0–10.3)
lymph#: 1.3 10*3/uL (ref 0.9–3.3)

## 2016-10-20 LAB — COMPREHENSIVE METABOLIC PANEL
ALT: 14 U/L (ref 0–55)
AST: 17 U/L (ref 5–34)
Albumin: 2.9 g/dL — ABNORMAL LOW (ref 3.5–5.0)
Alkaline Phosphatase: 83 U/L (ref 40–150)
Anion Gap: 10 mEq/L (ref 3–11)
BUN: 18.3 mg/dL (ref 7.0–26.0)
CHLORIDE: 103 meq/L (ref 98–109)
CO2: 24 mEq/L (ref 22–29)
CREATININE: 1 mg/dL (ref 0.7–1.3)
Calcium: 9.2 mg/dL (ref 8.4–10.4)
EGFR: 68 mL/min/{1.73_m2} — ABNORMAL LOW (ref >90)
GLUCOSE: 232 mg/dL — AB (ref 70–140)
POTASSIUM: 4.4 meq/L (ref 3.5–5.1)
SODIUM: 137 meq/L (ref 136–145)
Total Bilirubin: 0.5 mg/dL (ref 0.20–1.20)
Total Protein: 6.5 g/dL (ref 6.4–8.3)

## 2016-10-20 NOTE — Progress Notes (Signed)
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  HISTORY OF PRESENT ILLNESS::Steve Frank is a 83 y.o. male who is seen in the office for Abnormal brain scan and chest scan and PET scan done over the last several months. The patient originally presented to the emergency room in [facility] with ataxia, was found to have a mass in the left cerebellar area. Apparently this was partially resected but no definitive diagnosis was made. The patient continues to have neurologic problems with ataxia difficulty walking. Follow-up CT scan PET scan suggests mediastinal adenopathy. Attempted a needle biopsy of a left supraclavicular node was unsuccessful as there was no ultrasound correlate to biopsy. The patient is referred for consideration of bronchoscopy with ebus. With the patient's poor medical condition he has definite not it candidate for video-assisted thoracoscopy. Previous bronchoscopy  done in [facility] did not reveal a tissue diagnosis of what is suspected to be stage IV lung cancer. On September 7 the patient proceeded to undergo bronchoscopy with brushings endobronchial ultrasound with trans bronchial biopsy of 4R and 10 lymph nodes by Dr. Servando Snare. Pathology from this procedure revealed malignant cells consistent with non-small cell carcinoma favoring squamous cell carcinoma.    No numbness in the tongue. Swelling in the legs. He cannot walk on his due to imbalance. He does report having chest pain, denies coughing blood.     The patient was referred today for presentation in the multidisciplinary conference.  Radiology studies and  pathology slides were presented there for review and discussion of treatment options.  A consensus was discussed regarding potential next steps.  PREVIOUS RADIATION THERAPY: No  PAST MEDICAL HISTORY:  Past Medical History:  Diagnosis Date  . Arthritis   . BPH (benign prostatic hyperplasia)   . CAD (coronary artery disease)    s/p 2 cabg's  . Cancer (HCC)    Basal and squamous cell  skin cancer  . Cerebellar mass   . Chest mass   . Diabetes ([place])    Type II  . GERD (gastroesophageal reflux disease)   . Head injury with loss of consciousness [facility])    age 59 or 51-   . Heart attack ([place])   . HLD (hyperlipidemia)   . Hypertension   . Pneumonia     x 2 last 1071     PAST SURGICAL HISTORY: Past Surgical History:  Procedure Laterality Date  . CARDIAC CATHETERIZATION    . COLONOSCOPY W/ POLYPECTOMY    . CORONARY ARTERY BYPASS GRAFT     Dr Prescott Gum [facility] 07/2007  . CORONARY ARTERY BYPASS GRAFT     1st one was @ [facility] 26years ago 12/1991  . CRANIECTOMY FOR EXCISION OF BRAIN TUMOR INFRATENTORIAL / POSTERIOR FOSSA  08/05/2016   Dr Rebecka Apley  @ [facility]/Neurosurgery  . FLEXIBLE BRONCHOSCOPY  08/07/2016   Dr Gwenevere Ghazi  . LUNG BIOPSY N/A 10/14/2016   Procedure: LUNG BIOPSY;  Surgeon: Grace Isaac, MD;  Location: [facility];  Service: Thoracic;  Laterality: N/A;  . VIDEO BRONCHOSCOPY WITH ENDOBRONCHIAL ULTRASOUND N/A 10/14/2016   Procedure: VIDEO BRONCHOSCOPY WITH ENDOBRONCHIAL ULTRASOUND;  Surgeon: Grace Isaac, MD;  Location: [place];  Service:  Thoracic;  Laterality: N/A;    FAMILY HISTORY: family history is not on file.  SOCIAL HISTORY:  reports that he has quit smoking. He quit after 35.00 years of use. He has quit using smokeless tobacco. He reports that he does not drink alcohol or use drugs. Patient was here today with his daughter, son and granddaughter.  ALLERGIES: Ciprofloxacin  MEDICATIONS:  Current Outpatient Prescriptions  Medication Sig Dispense Refill    . acetaminophen (TYLENOL) 325 MG tablet Take 650 mg by mouth every 6 (six) hours as needed (for pain/headaches.).     Marland Kitchen albuterol (PROVENTIL HFA;VENTOLIN HFA) 108 (90 Base) MCG/ACT inhaler Inhale 2 puffs into the lungs every 6 (six) hours as needed (for wheezing/shortness of breath).     Marland Kitchen amLODipine (NORVASC) 5 MG tablet Take 5 mg by mouth daily.     Marland Kitchen atorvastatin (LIPITOR) 10 MG tablet take 1 tablet by mouth every evening    . carvedilol (COREG) 25 MG tablet Take 25 mg by mouth 2 (two) times daily with a meal.     . Cholecalciferol (VITAMIN D-1000 MAX ST) 1000 units tablet Take 1,000 Units by mouth daily.     . clotrimazole-betamethasone (LOTRISONE) cream Apply 1 application topically 2 (two) times daily.    Marland Kitchen docusate sodium (COLACE) 100 MG capsule Take 100 mg by mouth 2 (two) times daily as needed (for constipation.).    Marland Kitchen furosemide (LASIX) 20 MG tablet Take 20 mg by mouth daily.  0  . gabapentin (NEURONTIN) 400 MG capsule Take 400 mg by mouth at bedtime.    Marland Kitchen HUMALOG 100 UNIT/ML injection INJECT 2 TO 0.12MLS (2-8 UNITS) UNDER THE SKIN 3 TIMES A DAY BEFORE MEALS, AS PER SLIDING SCALE  0  . LANTUS 100 UNIT/ML injection INJECT 0.15ML (15 UNITS TOTAL) UNDER THE SKIN DAILY AT BEDTIME.  0  . magnesium hydroxide (MILK OF MAGNESIA) 400 MG/5ML suspension Take 30 mLs by mouth daily as needed (for constipation.).    Marland Kitchen mirtazapine (REMERON) 15 MG tablet Take 15 mg by mouth at bedtime.  0  . Multiple Vitamin (MULTIVITAMIN WITH MINERALS) TABS tablet Take 1 tablet by mouth daily.    Marland Kitchen neomycin-bacitracin-polymyxin (NEOSPORIN) [phone] ointment Apply 1 application topically at bedtime.    Marland Kitchen omeprazole (PRILOSEC) 20 MG capsule Take 20 mg by mouth at bedtime.     . polyethylene glycol (MIRALAX / GLYCOLAX) packet Take 17 g by mouth daily as needed (for constipation.).     Marland Kitchen tamsulosin (FLOMAX) 0.4 MG CAPS capsule Take 0.4 mg by mouth at bedtime.     No current facility-administered medications for this  encounter.     REVIEW OF SYSTEMS:  REVIEW OF SYSTEMS: A 10+ POINT REVIEW OF SYSTEMS WAS OBTAINED including neurology, dermatology, psychiatry, cardiac, respiratory, lymph, extremities, GI, GU, musculoskeletal, constitutional, reproductive, HEENT. All pertinent positives are noted in the HPI. All others are negative. Family members report that he was very functional early June    PHYSICAL EXAM: Oncology Vitals 10/20/2016  Height 180 cm  Weight 84.324 kg  Weight (lbs) 185 lbs 14 oz  BMI (kg/m2) 25.93 kg/m2  Temp 97.2  Pulse 48  Resp 18  SpO2 96  BSA (m2) 2.05 m2   General: Alert and oriented, in no acute distress, Accompanied by her granddaughter son and wife. The patient is sitting in a wheelchair. HEENT: Head is normocephalic. Extraocular movements are intact. Oropharynx is clear. Patient has a scar in the occipital region is healed well from his neurosurgical procedure  Neck: Neck is supple, no palpable cervical or supraclavicular lymphadenopathy. Heart: Regular in rate and rhythm with no murmurs, rubs, or gallops. Chest: Clear to auscultation bilaterally, with no rhonchi, wheezes, or rales. Abdomen: Soft, nontender, nondistended, with no rigidity or guarding. Extremities: No cyanosis, pitting edema in the ankle and foot areas Lymphatics: see Neck Exam Skin: No concerning lesions. Musculoskeletal: symmetric strength and muscle tone throughout. Neurologic: Cranial nerves II through XII are grossly intact. No obvious focalities. Speech is fluent.  finger nose exam showed significant unsteadiness with his left hand. Psychiatric: Judgment and insight are intact. Affect is appropriate.    KPS = 60    LABORATORY DATA:  Lab Results  Component Value Date   WBC 8.3 10/14/2016   HGB 10.9 (L) 10/14/2016   HCT 34.3 (L) 10/14/2016   MCV 95.3 10/14/2016   PLT 205 10/14/2016   Lab Results  Component Value Date   NA 138 10/14/2016   K 4.2 10/14/2016   CL 105 10/14/2016   CO2 20 (L)  10/14/2016   Lab Results  Component Value Date   ALT 17 10/14/2016   AST 23 10/14/2016   ALKPHOS 65 10/14/2016   BILITOT 0.7 10/14/2016    PULMONARY FUNCTION TEST:   Recent Review Flowsheet Data    There is no flowsheet data to display.      RADIOGRAPHY: Dg Chest 2 View  Result Date: 10/14/2016 CLINICAL DATA:  Lung mass. Preoperative for lung biopsy. History of hypertension. EXAM: CHEST  2 VIEW COMPARISON:  Chest 09/13/2016. PET-CT 09/20/2016. CT chest 08/04/2016 FINDINGS: Postoperative changes in the mediastinum. Normal heart size and pulmonary vascularity. Limited visualization of left hilar mass, better seen on previous CT. Left hilum appears mildly prominent. Infiltrate or atelectasis in the left lung base behind the heart. Small bilateral pleural effusions. No pneumothorax. Calcified and tortuous aorta. IMPRESSION: Left hilar mass with probable postobstructive change in the left lung base is better visualized on previous CT scan. Small bilateral pleural effusions. Electronically Signed   By: Lucienne Capers M.D.   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 Patient did have attempted biopsy/resection of the left cerebellum lesion of the brain in [facility]. His biopsy only showed gliosis. Discussion with family today and their  discussion with the neurosurgeon,   it was felt that attempted recession of lesion was not possible. This would explain the uptake of the cerebellum lesion on the patients recent PET scan. Patient continues to have  Ataxia, and feels this is worsening. Since the lesion was not resected, this would explain his continued symptoms.   PLAN: Patient will be setup for consultation with the [facility] treatment team. He will proceed with a 3T MRI to evaluate the brain further.  He may be potential candidate for SRS . Patient will also have additional testing of his biopsy tissue from his bronchoscopy to see if  he is a candidate for targeted therapy.   ------------------------------------------------  Blair Promise, PhD, MD  This document serves as a record of services personally performed by Gery Pray MD. It was created on his behalf by Delton Coombes, a trained medical scribe. The creation of this record is based on the scribe's personal observations and the provider's statements to them. This document has been checked and approved by the attending provider.

## 2016-10-20 NOTE — Progress Notes (Signed)
Steve Frank Telephone:(336) [phone]   [phone] Multidisciplinary thoracic oncology clinic  CONSULT NOTE  REFERRING PHYSICIAN: Dr. Lanelle Frank  REASON FOR CONSULTATION:  83 years old white male recently diagnosed with lung cancer.  HPI Steve Frank is a 83 y.o. male was past medical history significant for multiple medical problems including coronary artery disease, hypertension, diabetes mellitus, dyslipidemia, hearing deficit, GERD, benign prostatic hypertrophy, osteoarthritis as well as history of heavy smoking but quit 31 years ago. The patient mentioned that she was working at his yard and picking weeds. He fell down and flipped several times. He was taken to the emergency department at [facility] and MRI of the brain performed at that time on 08/04/2016 showed 2.2 x 2.2 x 1.7 cm left posterior fossa just below the tentorium. There was extensive vasogenic edema in the adjacent cerebellum, suspicious for meningioma but metastatic disease was less likely. CT scan of the chest, abdomen and pelvis were performed on 08/04/2016 and it showed mediastinal and left hilar adenopathy. There was 4.0 x 6.0 cm left infrahilar mass could be confluent lymphadenopathy or central lung neoplasm. No evidence of metastatic disease in the abdomen or pelvis. On 08/05/2016 the patient underwent excisional biopsy of the cerebellar lesion but the final pathology showed normal brain tissue. The patient also underwent bronchoscopy on 08/07/2016 and the cytology was remarkable for chronic inflammatory cells. He was followed by observation and PET scan on 09/20/2016 showed hypermetabolic lesion in the left cerebellum with intense metabolic activity. There was also hypermetabolic left infrahilar mass measuring 2.0 cm with SUV max of 4.0. There is hypermetabolic subcarinal and right lower paratracheal adenopathy. There was also right lower paratracheal lymph node measuring  2.0 cm in short axis with SUV max of 4.9 and a small hypermetabolic left supraclavicular node measuring 0.8 cm with SUV max equal 2.3. There was also right upper lobe pulmonary nodule measuring 0.8 cm with no associated hypermetabolic activity. There was no concerning findings in the abdomen or pelvis or skeletal metastasis. The patient was referred to Dr. Servando Snare and on 10/14/2016 he underwent bronchoscopy with brushing, endobronchial ultrasound with transbronchial biopsy of 4R and 10L nodes.  The final cytology ([id number]) of the fine-needle aspiration of the 4R lymph node showed malignant cells consistent with non-small cell carcinoma favoring squamous cell carcinoma. Dr. Servando Snare kindly referred the patient to the multidisciplinary thoracic oncology clinic today for further evaluation and recommendation regarding treatment of his condition. When seen today the patient is feeling fine except for the weakness in the legs and recent forward secondary to balance issues. He also has cough productive of clear sputum and shortness breath with exertion but no significant chest pain or hemoptysis. He lost several pounds in the last few weeks secondary to lack of appetite. He has occasional headache but no visual changes. He denied having any nausea, vomiting, diarrhea or constipation. Family history significant for mother died at age 37 with dementia, father died from heart attack, brother had lung cancer at age 53 and sister had heart disease. The patient is married and has 4 children. He was accompanied today by his wife, Tana Conch, his son Wille Glaser and his granddaughter Maudie Mercury. 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 He has a history of smoking 2 pack per day for around 35 years but quit 31 years ago. He drinks alcohol occasionally and no history of drug abuse.  HPI  Past Medical History:  Diagnosis Date  . Arthritis   . BPH (benign prostatic hyperplasia)   .  CAD (coronary artery disease)    s/p 2 cabg's  . Cancer (HCC)    Basal and squamous cell  skin cancer  . Cerebellar mass   . Chest mass   . Diabetes ([place])    Type II  . GERD (gastroesophageal reflux disease)   . Head injury with loss of consciousness [facility])    age 89 or 46-   . Heart attack ([place])   . HLD (hyperlipidemia)   . Hypertension   . Pneumonia     x 2 last 1071    Past Surgical History:  Procedure Laterality Date  . CARDIAC CATHETERIZATION    . COLONOSCOPY W/ POLYPECTOMY    . CORONARY ARTERY BYPASS GRAFT     Dr Prescott Gum [facility] 07/2007  . CORONARY ARTERY BYPASS GRAFT     1st one was @ [facility] 26years ago 12/1991  . CRANIECTOMY FOR EXCISION OF BRAIN TUMOR INFRATENTORIAL / POSTERIOR FOSSA  08/05/2016   Dr Rebecka Apley  @ [facility]/Neurosurgery  . FLEXIBLE BRONCHOSCOPY  08/07/2016   Dr Gwenevere Ghazi  . LUNG BIOPSY N/A 10/14/2016   Procedure: LUNG BIOPSY;  Surgeon: Grace Isaac, MD;  Location: [place];  Service: Thoracic;  Laterality: N/A;  . VIDEO BRONCHOSCOPY WITH ENDOBRONCHIAL ULTRASOUND N/A 10/14/2016   Procedure: VIDEO BRONCHOSCOPY WITH ENDOBRONCHIAL ULTRASOUND;  Surgeon: Grace Isaac, MD;  Location: [place];  Service: Thoracic;  Laterality: N/A;    No family history on file.  Social History Social History  Substance Use Topics  . Smoking status: Former Smoker    Years: 35.00  . Smokeless tobacco: Former Systems developer     Comment: 10/13/16- Quit 31 years ago  . Alcohol use No    Allergies  Allergen Reactions  . Ciprofloxacin Nausea And Vomiting    Current Outpatient Prescriptions  Medication Sig Dispense Refill  . acetaminophen (TYLENOL) 325 MG tablet Take 650 mg by mouth every 6 (six) hours as needed (for pain/headaches.).     Marland Kitchen albuterol (PROVENTIL HFA;VENTOLIN HFA) 108 (90 Base) MCG/ACT inhaler Inhale 2 puffs into the lungs every 6 (six) hours as needed (for wheezing/shortness of breath).     Marland Kitchen amLODipine (NORVASC) 5 MG tablet Take 5 mg by mouth daily.     Marland Kitchen  atorvastatin (LIPITOR) 10 MG tablet take 1 tablet by mouth every evening    . carvedilol (COREG) 25 MG tablet Take 25 mg by mouth 2 (two) times daily with a meal.     . Cholecalciferol (VITAMIN D-1000 MAX ST) 1000 units tablet Take 1,000 Units by mouth daily.     . clotrimazole-betamethasone (LOTRISONE) cream Apply 1 application topically 2 (two) times daily.    Marland Kitchen docusate sodium (COLACE) 100 MG capsule Take 100 mg by mouth 2 (two) times daily as needed (for constipation.).    Marland Kitchen furosemide (LASIX) 20 MG tablet Take 20 mg by mouth daily.  0  . gabapentin (NEURONTIN) 400 MG capsule Take 400 mg by mouth at bedtime.    Marland Kitchen HUMALOG 100 UNIT/ML injection INJECT 2 TO 0.12MLS (2-8 UNITS) UNDER THE SKIN 3 TIMES A DAY BEFORE MEALS, AS PER SLIDING SCALE  0  . LANTUS 100 UNIT/ML injection INJECT 0.15ML (15 UNITS TOTAL) UNDER THE SKIN DAILY AT BEDTIME.  0  . magnesium hydroxide (MILK OF MAGNESIA) 400 MG/5ML suspension Take 30 mLs by mouth daily as needed (for constipation.).    Marland Kitchen  mirtazapine (REMERON) 15 MG tablet Take 15 mg by mouth at bedtime.  0  . Multiple Vitamin (MULTIVITAMIN WITH MINERALS) TABS tablet Take 1 tablet by mouth daily.    Marland Kitchen neomycin-bacitracin-polymyxin (NEOSPORIN) [phone] ointment Apply 1 application topically at bedtime.    Marland Kitchen omeprazole (PRILOSEC) 20 MG capsule Take 20 mg by mouth at bedtime.     . polyethylene glycol (MIRALAX / GLYCOLAX) packet Take 17 g by mouth daily as needed (for constipation.).     Marland Kitchen tamsulosin (FLOMAX) 0.4 MG CAPS capsule Take 0.4 mg by mouth at bedtime.     No current facility-administered medications for this visit.     Review of Systems  Constitutional: positive for anorexia, fatigue and weight loss Eyes: negative Ears, nose, mouth, throat, and face: negative Respiratory: positive for cough, dyspnea on exertion and sputum Cardiovascular: negative Gastrointestinal: negative Genitourinary:negative Integument/breast: negative Hematologic/lymphatic:  negative Musculoskeletal:positive for arthralgias and muscle weakness Neurological: positive for coordination problems Behavioral/Psych: negative Endocrine: negative Allergic/Immunologic: negative  Physical Exam  YFV:CBSWH, healthy, no distress, well nourished and well developed SKIN: skin color, texture, turgor are normal, no rashes or significant lesions HEAD: Normocephalic, No masses, lesions, tenderness or abnormalities EYES: normal, PERRLA, Conjunctiva are pink and non-injected EARS: External ears normal, Canals clear OROPHARYNX:no exudate, no erythema and lips, buccal mucosa, and tongue normal  NECK: supple, no adenopathy, no JVD LYMPH:  no palpable lymphadenopathy, no hepatosplenomegaly LUNGS: clear to auscultation , and palpation HEART: regular rate & rhythm, no murmurs and no gallops ABDOMEN:abdomen soft, non-tender, normal bowel sounds and no masses or organomegaly BACK: Back symmetric, no curvature., No CVA tenderness EXTREMITIES:no joint deformities, effusion, or inflammation, no edema  NEURO: alert & oriented x 3 with fluent speech  PERFORMANCE STATUS: ECOG 1-2  LABORATORY DATA: Lab Results  Component Value Date   WBC 9.9 10/20/2016   HGB 11.7 (L) 10/20/2016   HCT 35.3 (L) 10/20/2016   MCV 94.9 10/20/2016   PLT 230 10/20/2016      Chemistry      Component Value Date/Time   NA 138 10/14/2016 0623   K 4.2 10/14/2016 0623   CL 105 10/14/2016 0623   CO2 20 (L) 10/14/2016 0623   BUN 14 10/14/2016 0623   CREATININE 0.92 10/14/2016 0623      Component Value Date/Time   CALCIUM 8.7 (L) 10/14/2016 0623   ALKPHOS 65 10/14/2016 0623   AST 23 10/14/2016 0623   ALT 17 10/14/2016 0623   BILITOT 0.7 10/14/2016 0623       RADIOGRAPHIC STUDIES: Dg Chest 2 View  Result Date: 10/14/2016 CLINICAL DATA:  Lung mass. Preoperative for lung biopsy. History of hypertension. EXAM: CHEST  2 VIEW COMPARISON:  Chest 09/13/2016. PET-CT 09/20/2016. CT chest 08/04/2016 FINDINGS:  Postoperative changes in the mediastinum. Normal heart size and pulmonary vascularity. Limited visualization of left hilar mass, better seen on previous CT. Left hilum appears mildly prominent. Infiltrate or atelectasis in the left lung base behind the heart. Small bilateral pleural effusions. No pneumothorax. Calcified and tortuous aorta. IMPRESSION: Left hilar mass with probable postobstructive change in the left lung base is better visualized on previous CT scan. Small bilateral pleural effusions. Electronically Signed   By: Lucienne Capers M.D.   On: 10/14/2016 06:27    ASSESSMENT:This is a very pleasant 83 years old white male with highly suspicious stage IV (t1b, N3, M1c) non-small cell lung cancer, squamous cell carcinoma diagnosed in September 2018 presented with left infrahilar mass in addition to mediastinal and supraclavicular  lymphadenopathy as well as metastatic brain lesion.   PLAN: I had a lengthy discussion with the patient and his family today about his current disease is stage, prognosis and treatment options. I personally and independently reviewed his scan images and discuss the results and showed the images to the patient and his family. I also reviewed his previous record from [facility]. I recommended for the patient to send his tissue block for PDL 1 testing. I also recommended for the patient to see radiation oncology today for evaluation and consideration of stereotactic radiotherapy to the solitary brain metastasis.He will need repeat MRI of the brain to rule out any other metastatic disease. I discussed with the patient several options for treatment of his condition and goals of care. I gave him the option of palliative care and hospice referral versus consideration of palliative systemic chemotherapy with carboplatin and paclitaxel plus/minus Ketruda (pembrolizumab) versus treatment with Nat Math (pembrolizumab) as a single agent if his PDL expression is over 50%. The  patient is interested in treatment. He would like to wait until the PDL 1 expression results are available. During this time he would be receiving stereotactic radiotherapy to the brain lesions. I will see him back for follow-up visit in 3 weeks for reevaluation and more detailed discussion of his treatment options. The patient was advised to call immediately if he has any concerning symptoms in the interval. He was seen during the multidisciplinary thoracic oncology clinic today by medical oncology, radiation oncology, thoracic navigator, social worker and physical therapist. The patient voices understanding of current disease status and treatment options and is in agreement with the current care plan. All questions were answered. The patient knows to call the clinic with any problems, questions or concerns. We can certainly see the patient much sooner if necessary.  Thank you so much for allowing me to participate in the care of Renetta Chalk. I will continue to follow up the patient with you and assist in his care.  I spent 55 minutes counseling the patient face to face. The total time spent in the appointment was 80 minutes.  Disclaimer: This note was dictated with voice recognition software. Similar sounding words can inadvertently be transcribed and may not be corrected upon review.   Eilleen Kempf October 20, 2016, 2:21 PM

## 2016-10-20 NOTE — Therapy (Signed)
[place], [place], [address] Phone: [phone]   Fax:  [phone]  Physical Therapy Evaluation  Patient Details  Name: Steve Frank MRN: [id number] Date of Birth: [birth year] Referring Provider: Dr. Curt Bears  Encounter Date: 10/20/2016      PT End of Session - 10/20/16 1653    Visit Number 1   Number of Visits 9   Date for PT Re-Evaluation 12/07/16   PT Start Time 5009   PT Stop Time 3818  and 1630-1648 for 45 minute total   PT Time Calculation (min) 27 min   Activity Tolerance Patient tolerated treatment well   Behavior During Therapy [facility] for tasks assessed/performed      Past Medical History:  Diagnosis Date  . Arthritis   . BPH (benign prostatic hyperplasia)   . CAD (coronary artery disease)    s/p 2 cabg's  . Cancer (HCC)    Basal and squamous cell  skin cancer  . Cerebellar mass   . Chest mass   . Diabetes ([place])    Type II  . GERD (gastroesophageal reflux disease)   . Head injury with loss of consciousness [facility])    age 53 or 9-   . Heart attack ([place])   . HLD (hyperlipidemia)   . Hypertension   . Pneumonia     x 2 last 1071  . Stage IV squamous cell carcinoma of left lung ([place]) 10/20/2016    Past Surgical History:  Procedure Laterality Date  . CARDIAC CATHETERIZATION    . COLONOSCOPY W/ POLYPECTOMY    . CORONARY ARTERY BYPASS GRAFT     Dr Prescott Gum [facility] 07/2007  . CORONARY ARTERY BYPASS GRAFT     1st one was @ [facility] 26years ago 12/1991  . CRANIECTOMY FOR EXCISION OF BRAIN TUMOR INFRATENTORIAL / POSTERIOR FOSSA  08/05/2016   Dr Rebecka Apley  @ [facility]/Neurosurgery  . FLEXIBLE BRONCHOSCOPY  08/07/2016   Dr Gwenevere Ghazi  . LUNG BIOPSY N/A 10/14/2016   Procedure: LUNG BIOPSY;  Surgeon: Grace Isaac, MD;  Location: [place];  Service: Thoracic;  Laterality: N/A;  . VIDEO BRONCHOSCOPY WITH ENDOBRONCHIAL ULTRASOUND N/A 10/14/2016   Procedure: VIDEO BRONCHOSCOPY  WITH ENDOBRONCHIAL ULTRASOUND;  Surgeon: Grace Isaac, MD;  Location: [place];  Service: Thoracic;  Laterality: N/A;    There were no vitals filed for this visit.       Subjective Assessment - 10/20/16 1616    Subjective "I'm getting worse, going downhill."   Patient is accompained by: Family member  wife, son, granddaughter   Pertinent History Pt. presented to ED with ataxia.  Workup showed brain and lung mass.  Pt. had some type of neurosurgery at [facility] where a biopsy was benign for the brain mass, but patient's current medical oncologist thinks this is most likely metastatic lung cancer.  Lung cancer favors squamous cell carcinoma. Pt. expects to have chemo or immunotherapy and SRS to the brain lesion. CABG in 1993 and 2009. DM, HTN, arthritis.   Patient Stated Goals get info from all lung clinic providers   Currently in Pain? 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 Curt Bears   Onset Date/Surgical Date 08/04/16   Hand Dominance Right   Prior Therapy none     Precautions   Precautions Fall;Other (comment)   Precaution Comments metastatic cancer precautions     Restrictions   Weight Bearing Restrictions No     Balance Screen   Has the patient fallen in the past 6 months Yes   How many times? 4  or more   Has the patient had a decrease in activity level because of a fear of falling?  Yes   Is the patient reluctant to leave their home because of a fear of falling?  Yes     [place] Private residence   Living Arrangements  Spouse/significant other   Type of [place] to enter   Entrance Stairs-Number of Steps 2   [place] --  yes   Home Layout One level   [place] - 2 wheels;Grab bars - toilet;Grab bars - tub/shower;Wheelchair - manual     Prior Function   Level of Independence Independent   Vocation Retired   Leisure until this, stayed active with yardwork, car and RV care, tree work     Charity fundraiser Status Within Functional Limits for tasks assessed     Observation/Other Assessments   Observations older gentleman sitting in a wheelchair     Functional Tests   Functional tests Sit to Stand     Sit to Stand   Comments unable to stand without using hands to push off     Posture/Postural Control   Posture/Postural Control Postural limitations   Postural Limitations Forward head;Rounded Shoulders;Decreased lumbar lordosis     ROM / Strength   AROM / PROM / Strength AROM;Strength     AROM   Overall AROM Comments not assessed due to patient's poor balance; does lean forward in sitting to stretch his back     Strength   Overall Strength Comments strength not tested, but pt. c/o lower body weakness     Ambulation/Gait   Ambulation/Gait Yes   Ambulation/Gait Assistance 4: Min assist   Assistive device 1 person hand held assist  says he has a wheeled walker at home; wheelchair today   Gait Comments walks with hand hold assist today with a 2nd standby; has a walker at home but should be supervised     Balance   Balance Assessed Yes     Static Standing Balance   Static Standing - Balance Support No upper extremity supported   Static Standing - Level of Assistance 5: Stand by assistance            Objective measurements completed on examination: See above findings.                  PT Education - 10/20/16 1652    Education provided Yes   Education Details about physical therapy choice and options; energy  conservation, walking program (when safe), CURE article on staying active, "Why exercise?" flyer, posture, breathing, PT info   Person(s) Educated Patient;Spouse;Child(ren)   Methods Explanation;Handout   Comprehension Verbalized understanding               Lung Clinic Goals - 10/20/16 1703      Patient will  be able to verbalize understanding of the benefit of exercise to decrease fatigue.   Status Achieved     Patient will be able to verbalize the importance of posture.   Status Achieved     Patient will be able to demonstrate diaphragmatic breathing for improved lung function.   Status Achieved     Patient will be able to verbalize understanding of the role of physical therapy to prevent functional decline and who to contact if physical therapy is needed.   Status Achieved         Long Term Clinic Goals - 10/20/16 1703      CC Long Term Goal  #1   Title Other goals to be set if patient chooses to do his follow-up therapy with [place].   Time 4   Period Weeks   Status New             Plan - 10/20/16 1654    Clinical Impression Statement This is a very pleasant gentleman with new diagnosis of lung cancer, probable stage IV with brain metastases.  He presented initially with ataxia and was found to have a brain lesion.  He shows impaired static standing balance and gait.  He reports back pain.  Posture is forward head, rounded shoulders, flat back. He would definitely benefit from therapy to improve his balance and mobility.   History and Personal Factors relevant to plan of care: cerebellar lesion, DM, arthritis, HTN   Clinical Presentation Evolving   Clinical Presentation due to: will start chemoradiation soon   Clinical Decision Making Moderate   Rehab Potential Good   Clinical Impairments Affecting Rehab Potential metastatic lung cancer and will be starting chemotherapy and radiation soon   PT Frequency 2x / week   PT Duration 4 weeks   PT Treatment/Interventions  ADLs/Self Care Home Management;Electrical Stimulation;Moist Heat;Gait training;Stair training;Functional mobility training;Therapeutic exercise;Balance training;Neuromuscular re-education;Patient/family education   PT Next Visit Plan The patient and his family agree that he would benefit from physical therapy for his balance and mobility impairments.  Pt. also c/o lower extremity weakness, which was not assessed today.  They are not yet sure where they would like to do therapy (at home, near their home in an outpatient clinic, or come to [place]).  If he comes here, do [place] or other balance testing and LE manual muscle test.   Consulted and Agree with Plan of Care Patient;Family member/caregiver      Patient will benefit from skilled therapeutic intervention in order to improve the following deficits and impairments:  Decreased balance, Difficulty walking, Pain, Postural dysfunction  Visit Diagnosis: Unsteadiness on feet - Plan: PT plan of care cert/re-cert  History of falling - Plan: PT plan of care cert/re-cert  Difficulty in walking, not elsewhere classified - Plan: PT plan of care cert/re-cert  Abnormal posture - Plan: PT plan of care cert/re-cert      G-Codes - 08/02/92 1704    Functional Assessment Tool Used (Outpatient Only) clinical judgement   Functional Limitation Mobility: Walking and moving around   Mobility: Walking and Moving Around Current Status ([id number]) At least 60 percent but less than 80 percent impaired, limited or restricted   Mobility: Walking and Moving Around Goal Status [phone]) At least 60 percent but less than 80 percent impaired, limited or restricted   Mobility: Walking and Moving Around Discharge Status [phone]) At least 60 percent but less than 80 percent impaired, limited or restricted       Problem List Patient  Active Problem List   Diagnosis Date Noted  . Stage IV squamous cell carcinoma of left lung ([place]) 10/20/2016  . Generalized weakness 09/09/2016  .  Hyponatremia 09/09/2016  . Acute encephalopathy 09/05/2016  . BPH (benign prostatic hyperplasia) 09/04/2016  . Lung mass 08/08/2016  . Oropharyngeal dysphagia 08/08/2016  . Brain tumor ([place]) 08/04/2016  . Diabetes mellitus ([place]) 08/04/2016  . Essential hypertension 08/04/2016  . HLD (hyperlipidemia) 08/04/2016  . Loss of coordination 08/03/2016  . Atherosclerosis of native coronary artery of native heart 05/31/2015  . Diabetic polyneuropathy associated with type 2 diabetes mellitus ([place]) 05/31/2015  . Emphysema lung ([place]) 05/31/2015  . GERD (gastroesophageal reflux disease) 05/31/2015    Mahima Hottle 10/20/2016, 5:07 PM  [place], [place], [address] Phone: [phone]   Fax:  [phone]  Name: Steve Frank MRN: [id number] Date of Birth: 1932/02/07  Serafina Royals, PT 10/20/16 5:08 PM

## 2016-10-20 NOTE — Progress Notes (Signed)
[facility] Clinical Social Work  Clinical Social Work met with patient/family at [employer] appointment to offer support and assess for psychosocial needs.  Patient was accompanied by his spouse, son, and granddaughter.  He reported his only concern is to get started with treatment.  Patient's spouse main concern is support for patient at home- discussed home care services and possible veteran benefits.    Clinical Social Work briefly discussed Clinical Social Work role and [employer] support programs/services.  Clinical Social Work encouraged patient to call with any additional questions or concerns.   Maryjean Morn, MSW, LCSW, OSW-C Clinical Social Worker [facility] [phone]

## 2016-10-20 NOTE — Telephone Encounter (Signed)
Scheduled appt per 9/13 los - Gave patient AVS and calender per los.  

## 2016-10-21 ENCOUNTER — Other Ambulatory Visit: Payer: Self-pay | Admitting: *Deleted

## 2016-10-21 DIAGNOSIS — D4989 Neoplasm of unspecified behavior of other specified sites: Secondary | ICD-10-CM

## 2016-10-22 ENCOUNTER — Encounter: Payer: Self-pay | Admitting: Internal Medicine

## 2016-10-22 DIAGNOSIS — Z5111 Encounter for antineoplastic chemotherapy: Secondary | ICD-10-CM | POA: Insufficient documentation

## 2016-10-22 DIAGNOSIS — Z7189 Other specified counseling: Secondary | ICD-10-CM | POA: Insufficient documentation

## 2016-10-24 ENCOUNTER — Telehealth: Payer: Self-pay

## 2016-10-24 MED ORDER — METHYLPREDNISOLONE 4 MG PO TBPK: ORAL_TABLET | ORAL | 0 refills | Status: DC

## 2016-10-24 NOTE — Telephone Encounter (Signed)
S/w wife that medrol dose pack was prescribed. To start in AM d/t it can create sleep disturbance.

## 2016-10-24 NOTE — Addendum Note (Signed)
Addended by: Janace Hoard on: 10/24/2016 04:30 PM   Modules accepted: Orders

## 2016-10-24 NOTE — Telephone Encounter (Signed)
Wife called that pt's appetite is terrible and she is requesting an appetite stimulant. Explained Dr Julien Nordmann not in office this afternoon and to call early tomorrow afternoon if she has not heard from [place].

## 2016-10-24 NOTE — Telephone Encounter (Signed)
Okay to use Medrol Dosepak

## 2016-10-26 ENCOUNTER — Encounter: Payer: Self-pay | Admitting: Radiation Oncology

## 2016-10-28 ENCOUNTER — Other Ambulatory Visit: Payer: Medicare Other

## 2016-10-31 ENCOUNTER — Encounter (HOSPITAL_COMMUNITY): Payer: Self-pay

## 2016-10-31 ENCOUNTER — Encounter: Payer: Self-pay | Admitting: *Deleted

## 2016-10-31 NOTE — Progress Notes (Signed)
Oncology Nurse Navigator Documentation  Oncology Nurse Navigator Flowsheets 10/31/2016  Navigator Location [facility]  Navigator Encounter Type Other/per Dr. Worthy Flank last progress note, patient would like results of PDL 1 before starting treatment. I contacted [facility] with an update on results   Abnormal Finding Date 08/04/2016  Confirmed Diagnosis Date 10/14/2016  Multidisiplinary Clinic Date 10/20/2016  Treatment Phase Pre-Tx/Tx Discussion  Barriers/Navigation Needs Coordination of Care  Interventions Coordination of Care  Coordination of Care Other  Acuity Level 2  Acuity Level 2 Other  Time Spent with Patient 30

## 2016-10-31 NOTE — Progress Notes (Signed)
Oncology Nurse Navigator Documentation  Oncology Nurse Navigator Flowsheets 10/31/2016  Navigator Location [facility]  Navigator Encounter Type Other/PDL 1 results are completed, Dr. Julien Nordmann updated.   Treatment Phase Pre-Tx/Tx Discussion  Barriers/Navigation Needs Coordination of Care  Interventions Coordination of Care  Coordination of Care Other  Acuity Level 1  Acuity Level 2 Other  Time Spent with Patient 15

## 2016-11-02 ENCOUNTER — Ambulatory Visit
Admission: RE | Admit: 2016-11-02 | Discharge: 2016-11-02 | Disposition: A | Discharge status: Home / Self Care | Payer: Medicare Other | Source: Ambulatory Visit | Attending: Radiation Oncology | Admitting: Radiation Oncology

## 2016-11-02 DIAGNOSIS — D4989 Neoplasm of unspecified behavior of other specified sites: Secondary | ICD-10-CM

## 2016-11-02 MED ORDER — GADOBENATE DIMEGLUMINE 529 MG/ML IV SOLN
17.0000 mL | Freq: Once | INTRAVENOUS | Status: AC | PRN
  Administered 2016-11-02: 17 mL via INTRAVENOUS

## 2016-11-04 ENCOUNTER — Other Ambulatory Visit: Payer: Self-pay | Admitting: Radiation Oncology

## 2016-11-04 ENCOUNTER — Inpatient Hospital Stay
Admission: RE | Admit: 2016-11-04 | Discharge: 2016-11-04 | Disposition: A | Discharge status: Home / Self Care | Payer: Self-pay | Source: Ambulatory Visit | Attending: Radiation Oncology | Admitting: Radiation Oncology

## 2016-11-04 ENCOUNTER — Ambulatory Visit
Admission: RE | Admit: 2016-11-04 | Discharge: 2016-11-04 | Disposition: A | Discharge status: Home / Self Care | Payer: Self-pay | Source: Ambulatory Visit | Attending: Radiation Oncology | Admitting: Radiation Oncology

## 2016-11-04 ENCOUNTER — Encounter: Payer: Self-pay | Admitting: Radiation Oncology

## 2016-11-04 DIAGNOSIS — C801 Malignant (primary) neoplasm, unspecified: Secondary | ICD-10-CM

## 2016-11-04 NOTE — Progress Notes (Addendum)
Location/Histology of Brain Tumor: highly suspicious stage IV (t1b, N3, M1c) non-small cell lung cancer, squamous cell carcinoma diagnosed in September 2018 presented with left infrahilar mass in addition to mediastinal and supraclavicular lymphadenopathy as well as metastatic brain lesion.  Patient presented with symptoms of:  The patient mentioned that he was working at his yard and picking weeds. He fell down and flipped several times. He was taken to the emergency department at [facility] and MRI of the brain performed at that time on 08/04/2016 showed 2.2 x 2.2 x 1.7 cm left posterior fossa just below the tentorium  Past or anticipated interventions, if any, per neurosurgery: no  Past or anticipated interventions, if any, per medical oncology: option of palliative care and hospice referral versus consideration of palliative systemic chemotherapy with carboplatin and paclitaxel plus/minus Ketruda (pembrolizumab) versus treatment with Nat Math (pembrolizumab) as a single agent if his PDL expression is over 50%. The patient is interested in treatment.   Dose of Decadron, if applicable: no  Recent neurologic symptoms, if any:   Seizures: no  Headaches: no  Nausea: yes, has not been prescribed any medication to manage  Dizziness/ataxia: yes  Difficulty with hand coordination: yes, left hand not right  Focal numbness/weakness: lower extremities  Visual deficits/changes: Blurry. Diplopia occasionally when watching television. Has macular degeneration.    Confusion/Memory deficits: yes. "wife reports a decline in his short term memory." She explained that prior to surgery his mind was very sharp and he would work on puzzles, etc during the day but, no longer does this.  Painful bone metastases at present, if any: No. However, patient reports back pain that makes it difficult for him to lay flat on the treatment table.   SAFETY ISSUES:  Prior radiation?  no  Pacemaker/ICD? no  Possible current pregnancy? no  Is the patient on methotrexate? no  Additional Complaints / other details: 82 year old male. Married. Resides in [place]. Patient reports he has experienced several falls. Reports poor appetite with a loss of 35 lb since June. Reports feeling off balance. Wife concerned about "places on patient's ears." Requesting PDL1 result. Scheduled to follow up with [facility] Wednesday.

## 2016-11-07 ENCOUNTER — Encounter: Payer: Self-pay | Admitting: Radiation Oncology

## 2016-11-07 ENCOUNTER — Ambulatory Visit
Admission: RE | Admit: 2016-11-07 | Discharge: 2016-11-07 | Disposition: A | Discharge status: Home / Self Care | Payer: Medicare Other | Source: Ambulatory Visit | Attending: Radiation Oncology | Admitting: Radiation Oncology

## 2016-11-07 VITALS — BP 130/63 | HR 52 | Temp 98.0°F | Resp 18 | Ht 71.0 in | Wt 171.0 lb

## 2016-11-07 DIAGNOSIS — C7931 Secondary malignant neoplasm of brain: Secondary | ICD-10-CM

## 2016-11-07 DIAGNOSIS — C7949 Secondary malignant neoplasm of other parts of nervous system: Principal | ICD-10-CM

## 2016-11-07 DIAGNOSIS — C3492 Malignant neoplasm of unspecified part of left bronchus or lung: Secondary | ICD-10-CM

## 2016-11-07 DIAGNOSIS — Z510 Encounter for antineoplastic radiation therapy: Secondary | ICD-10-CM | POA: Diagnosis not present

## 2016-11-07 HISTORY — DX: Unspecified macular degeneration: H35.30

## 2016-11-07 NOTE — Progress Notes (Signed)
See progress note under physician encounter. 

## 2016-11-07 NOTE — Progress Notes (Signed)
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 Secondary malignant neoplasm of brain and spinal cord Steve Frank) C79.31    C79.49     HISTORY OF PRESENT ILLNESS: Steve Frank is a 83 y.o. male seen at the request of Dr. Sondra Frank for highly suspicious stage IV (t1b, N3, M1c) non-small cell lung cancer, squamous cell carcinoma diagnosed in September 2018, with left infrahilar mass in addition to mediastinal and supraclavicular lymphadenopathy as well as metastatic brain lesion. The patient reports that he fell while doing yard work in June 2018 and was taken to the emergency department at [facility] MRI Brain was performed at that time and showed 2.2 x 2.2 x 1.7 cm mass in the left posterior fossa just below the tentorium. There was extensive vasogenic edema in the adjacent cerebellum, suspicious for meningioma but metastatic disease was less likely. CT scan of the chest, abdomen, and pelvis were performed on 08/04/2016 and showed mediastinal and left hilar adenopathy. There was a 4.0 x 6.0 cm left infrahilar mass  that could be confluent lymphadenopathy or central lung neoplasm. No evidence of metastatic disease in the abdomen or pelvis.   On 08/05/2016, the patient underwent attempted excisional biopsy of the cerebellar lesion, but the final pathology showed normal brain tissue. Post-op imaging shows that the biopsy tract did not reach the tumor location.  Due to the suspicion that the brain lesion might be metastasis from lung cancer, the patient also underwent bronchoscopy on 08/07/2016, and the cytology was remarkable for chronic inflammatory cells. He  was followed by observation, and PET scan on 09/20/2016 showed hypermetabolic lesion in the left cerebellum with intense metabolic activity. There was also hypermetabolic left infrahilar mass measuring 2.0 cm with SUV max of 4.0. There is hypermetabolic subcarinal and right lower paratracheal adenopathy. There was also right lower paratracheal lymph node measuring 2.0 cm in short axis with SUV max of 4.9 and a small hypermetabolic left supraclavicular node measuring 0.8 cm with SUV max equal 2.3. There was also right upper lobe pulmonary nodule measuring 0.8 cm with no associated hypermetabolic activity. There were no concerning findings in the abdomen, pelvis, or skeleton.  The patient was referred to Dr. Servando Frank, and on 10/14/2016, he underwent bronchoscopy with endobronchial ultrasound and transbronchial biopsy of 4R and 10L nodes. The final cytology (Steve Frank) of the fine-needle aspiration of the 4R lymph node showed malignant cells consistent with non-small cell carcinoma favoring squamous cell carcinoma.  Dr. Servando Frank kindly referred the patient to the multidisciplinary thoracic oncology clinic for further evaluation and recommendation regarding treatment of his condition, where he was seen by Dr. Julien Frank and Dr. Sondra Frank. The consensus was to obtain PDL 1 testing, proceed with palliative systemic chemotherapy depending on PDL expression, and discuss stereotactic radiotherapy upon completion of repeat MRI. His PDL 1 proportion score was 0%. 3T MRI Brain done on 11/02/2016 showed that the solitary left anterior superior cerebellar mass has increased since June to now 3.7 cm. No new brain masses or intracranial abnormalities were noted. He presents today to discuss potential radiotherapy options and the role radiotherapy could play in the treatment of his disease.  On review of systems, the patient states he has experienced several falls and feels "off balance." He reports poor appetite with a 35-pound  weight loss over the past 4 months. He also reports nausea and has not been prescribed any medication to manage. Associated symptoms include dizziness/ataxia, difficulty with left hand coordination, numbness/weakness in bilateral lower extremities, blurred vision, diplopia while watching TV, and macular degeneration. In addition, his wife reports a decline in his short-term memory. She explains that prior to surgery, his mind was very sharp and he would often work on puzzles, but he no longer does this. He denies pain but states that he does have back pain that makes it difficult for him to lay flat.   PREVIOUS RADIATION THERAPY: No  PAST MEDICAL HISTORY:  Past Medical History:  Diagnosis Date  . Arthritis   . BPH (benign prostatic hyperplasia)   . CAD (coronary artery disease)    s/p 2 cabg's  . Cancer (HCC)    Basal and squamous cell  skin cancer  . Cerebellar mass   . Chest mass   . Diabetes ([place])    Type II  . GERD (gastroesophageal reflux disease)   . Head injury with loss of consciousness [facility])    age 67 or 81-   . Heart attack ([place])   . HLD (hyperlipidemia)   . Hypertension   . Macular degeneration   . Pneumonia     x 2 last 1071  . Stage IV squamous cell carcinoma of left lung ([place]) 10/20/2016      PAST SURGICAL HISTORY: Past Surgical History:  Procedure Laterality Date  . CARDIAC CATHETERIZATION    . COLONOSCOPY W/ POLYPECTOMY    . CORONARY ARTERY BYPASS GRAFT     Dr Prescott [facility] 07/2007  . CORONARY ARTERY BYPASS GRAFT     1st one was @ [facility] 26years ago 12/1991  . CRANIECTOMY FOR EXCISION OF BRAIN TUMOR INFRATENTORIAL / POSTERIOR FOSSA  08/05/2016   Dr Rebecka Apley  @ [facility]/Neurosurgery  . FLEXIBLE BRONCHOSCOPY  08/07/2016   Dr Gwenevere Ghazi  . LUNG BIOPSY N/A 10/14/2016   Procedure: LUNG BIOPSY;  Surgeon: Grace Isaac, MD;  Location: [place];  Service: Thoracic;  Laterality: N/A;  . VIDEO BRONCHOSCOPY WITH ENDOBRONCHIAL ULTRASOUND N/A 10/14/2016    Procedure: VIDEO BRONCHOSCOPY WITH ENDOBRONCHIAL ULTRASOUND;  Surgeon: Grace Isaac, MD;  Location: [place];  Service: Thoracic;  Laterality: N/A;    FAMILY HISTORY:  Family History  Problem Relation Age of Onset  . Cancer Brother        lung to head and neck involving lymph nodes  . Cancer Paternal Aunt        breast  . Cancer Paternal Uncle        leukemia  . Cancer Paternal Aunt        breast  . Cancer Paternal Uncle        lung    SOCIAL HISTORY:  Social History   Social History  . Marital status: Married    Spouse name: N/A  . Number of children: 4  . Years of education: N/A   Occupational History  . retired    Social History Main Topics  . Smoking status: Former Smoker    Packs/day: 2.00    Years: 35.00    Types: Cigarettes    Quit date: 02/07/1985  . Smokeless tobacco: Former Systems developer     Comment: 10/13/16- Quit 31 years ago  . Alcohol use No  .  Drug use: No  . Sexual activity: Not Currently   Other Topics Concern  . Not on file   Social History Narrative  . No narrative on file    ALLERGIES: Ciprofloxacin  MEDICATIONS:  Current Outpatient Prescriptions  Medication Sig Dispense Refill  . acetaminophen (TYLENOL) 325 MG tablet Take 650 mg by mouth every 6 (six) hours as needed (for pain/headaches.).     Marland Kitchen albuterol (PROVENTIL HFA;VENTOLIN HFA) 108 (90 Base) MCG/ACT inhaler Inhale 2 puffs into the lungs every 6 (six) hours as needed (for wheezing/shortness of breath).     Marland Kitchen amLODipine (NORVASC) 5 MG tablet Take 5 mg by mouth daily.     . B-D INS SYR ULTRAFINE 1CC/31G 31G X 5/16" 1 ML MISC     . carvedilol (COREG) 25 MG tablet Take 25 mg by mouth 2 (two) times daily with a meal.     . Cholecalciferol (VITAMIN D-1000 MAX ST) 1000 units tablet Take 1,000 Units by mouth daily.     . clotrimazole-betamethasone (LOTRISONE) cream Apply 1 application topically 2 (two) times daily.    Marland Kitchen gabapentin (NEURONTIN) 400 MG capsule Take 400 mg by mouth at bedtime.    Marland Kitchen  HUMALOG 100 UNIT/ML injection INJECT 2 TO 0.12MLS (2-8 UNITS) UNDER THE SKIN 3 TIMES A DAY BEFORE MEALS, AS PER SLIDING SCALE  0  . LANTUS 100 UNIT/ML injection INJECT 0.15ML (15 UNITS TOTAL) UNDER THE SKIN DAILY AT BEDTIME.  0  . mirtazapine (REMERON) 15 MG tablet Take 15 mg by mouth at bedtime.  0  . Multiple Vitamin (MULTIVITAMIN WITH MINERALS) TABS tablet Take 1 tablet by mouth daily.    Marland Kitchen omeprazole (PRILOSEC) 20 MG capsule Take 20 mg by mouth at bedtime.     . rosuvastatin (CRESTOR) 20 MG tablet Take 20 mg by mouth daily.    Marland Kitchen docusate sodium (COLACE) 100 MG capsule Take 100 mg by mouth 2 (two) times daily as needed (for constipation.).    Marland Kitchen furosemide (LASIX) 20 MG tablet Take 20 mg by mouth daily.  0  . magnesium hydroxide (MILK OF MAGNESIA) 400 MG/5ML suspension Take 30 mLs by mouth daily as needed (for constipation.).    Marland Kitchen neomycin-bacitracin-polymyxin (NEOSPORIN) [phone] ointment Apply 1 application topically at bedtime.    . polyethylene glycol (MIRALAX / GLYCOLAX) packet Take 17 g by mouth daily as needed (for constipation.).      No current facility-administered medications for this encounter.       PHYSICAL EXAM:  Wt Readings from Last 3 Encounters:  11/07/16 171 lb (77.6 kg)  10/20/16 185 lb 14.4 oz (84.3 kg)  10/14/16 190 lb (86.2 kg)   Temp Readings from Last 3 Encounters:  11/07/16 98 F (36.7 C) (Oral)  10/20/16 (!) 97.2 F (36.2 C) (Oral)  10/14/16 98 F (36.7 C)   BP Readings from Last 3 Encounters:  11/07/16 130/63  10/20/16 (!) 130/45  10/14/16 (!) 118/52   Pulse Readings from Last 3 Encounters:  11/07/16 (!) 52  10/20/16 (!) 48  10/14/16 (!) 50   Pain Assessment Pain Score: 0-No pain (See progress note)/10  In general this is a well appearing caucasian male in no acute distress. He is alert and oriented x4 and appropriate throughout the examination. Neurologically he appears intact. HEENT reveals that the patient is normocephalic, atraumatic.  EOMs are intact. PERRLA. Skin is intact without any evidence of gross lesions.Cardiopulmonary assessment is negative for acute distress and he exhibits normal effort. Lymphatic assessment is performed and  does not reveal any adenopathy in the cervical, supraclavicular, axillary, or inguinal chains. Abdomen has active bowel sounds in all quadrants and is intact. The abdomen is soft, non tender, non distended. Lower extremities are negative for pretibial pitting edema, deep calf tenderness, cyanosis or clubbing.   KPS = 70  100 - Normal; no complaints; no evidence of disease. 90   - Able to carry on normal activity; minor signs or symptoms of disease. 80   - Normal activity with effort; some signs or symptoms of disease. 60   - Cares for self; unable to carry on normal activity or to do active work. 60   - Requires occasional assistance, but is able to care for most of his personal needs. 50   - Requires considerable assistance and frequent medical care. 9   - Disabled; requires special care and assistance. 7   - Severely disabled; hospital admission is indicated although death not imminent. 95   - Very sick; hospital admission necessary; active supportive treatment necessary. 10   - Moribund; fatal processes progressing rapidly. 0     - Dead  Karnofsky DA, Abelmann Amidon, Craver LS and Burchenal JH [phone]) The use of the nitrogen mustards in the palliative treatment of carcinoma: with particular reference to bronchogenic carcinoma Cancer 1 634-56  LABORATORY DATA:  Lab Results  Component Value Date   WBC 9.9 10/20/2016   HGB 11.7 (L) 10/20/2016   HCT 35.3 (L) 10/20/2016   MCV 94.9 10/20/2016   PLT 230 10/20/2016   Lab Results  Component Value Date   NA 137 10/20/2016   K 4.4 10/20/2016   CL 105 10/14/2016   CO2 24 10/20/2016   Lab Results  Component Value Date   ALT 14 10/20/2016   AST 17 10/20/2016   ALKPHOS 83 10/20/2016   BILITOT 0.50 10/20/2016     RADIOGRAPHY: Dg Chest 2  View  Result Date: 10/14/2016 CLINICAL DATA:  Lung mass. Preoperative for lung biopsy. History of hypertension. EXAM: CHEST  2 VIEW COMPARISON:  Chest 09/13/2016. PET-CT 09/20/2016. CT chest 08/04/2016 FINDINGS: Postoperative changes in the mediastinum. Normal heart size and pulmonary vascularity. Limited visualization of left hilar mass, better seen on previous CT. Left hilum appears mildly prominent. Infiltrate or atelectasis in the left lung base behind the heart. Small bilateral pleural effusions. No pneumothorax. Calcified and tortuous aorta. IMPRESSION: Left hilar mass with probable postobstructive change in the left lung base is better visualized on previous CT scan. Small bilateral pleural effusions. Electronically Signed   By: Lucienne Capers M.D.   On: 10/14/2016 06:27   Mr Jeri Cos FY Contrast  Result Date: 11/02/2016 CLINICAL DATA:  83 year old male originally diagnosed with left cerebellar mass in June, status post attempted resection but no definitive diagnosis was made. Recent PET-CT and bronchoscopic findings suggest stage IV non-small cell lung cancer, favoring squamous cell carcinoma. Restaging and treatment planning. Creatinine was obtained on site at [facility] at [address] Results: Creatinine 0.9 mg/dL. EXAM: MRI HEAD WITHOUT AND WITH CONTRAST TECHNIQUE: Multiplanar, multiecho pulse sequences of the brain and surrounding structures were obtained without and with intravenous contrast. CONTRAST:  54m MULTIHANCE GADOBENATE DIMEGLUMINE 529 MG/ML IV SOLN COMPARISON:  PET[facility]CT 09[facility]20/2016. [facility] earlier. FINDINGS: Brain: A 37 mm diameter rounded, intensely and at in the left ventral superior cerebellar sphere has increased since June, approximately 24 mm at that time. See series 10, image 49 today. There is mildly increased  regional edema tracking into the dorsal left brainstem, and regional mass effect including on  the dorsal left pons, the left cerebellar peduncle, and the left lateral aspect of the fourth ventricle (series 2, image 24). Basilar cisterns remain patent. No ventriculomegaly. Trace extra-axial hemorrhage along the undersurface of the left tentorium associated with smooth dural thickening. No other intracranial metastatic disease identified. Left occipital craniotomy site with more irregular appearing dural thickening. No restricted diffusion or evidence of acute infarction. No midline shift. Scattered and patchy nonspecific bilateral cerebral white matter T2 and FLAIR hyperintensity appears stable since June. No acute intracranial hemorrhage. Negative pituitary and cervicomedullary junction. Vascular: Major intracranial vascular flow voids are preserved. The major dural venous sinuses are enhancing and appear to remain patent. Skull and upper cervical spine: Negative visualized cervical spine and spinal cord except for degenerative changes. Visible bone marrow signal is within normal limits. Sinuses/Orbits: Normal orbits soft tissues. Visualized paranasal sinuses and mastoids are stable and well pneumatized. Other: Visible internal auditory structures appear normal. Left suboccipital scalp postoperative soft tissue changes. No acute scalp soft tissue findings. IMPRESSION: 1. Solitary left anterior superior cerebellar mass has increased since June to now 37 mm diameter. Mildly progressed regional mass effect and edema, including involvement of the left dorsal pons and left cerebellar peduncles. 2. No new brain mass/metastasis or new intracranial abnormality identified. 3. Sequelae of left suboccipital craniotomy. Electronically Signed   By: Genevie Ann M.D.   On: 11/02/2016 15:10      IMPRESSION/PLAN: 1. 83 y.o. with Stage IV T1bN3M1c, NSCLC, squamous cell carcinoma appearing in the left hilum with disease to the brain. We discusses the pathology findings and reviews the nature of stage IV lung disease. We  discussed his options for radiotherapy and would recommend a course of stereotactic radiosurgery [facility]) to the solitary brain metastasis. We reviewed the logistics and options of this course of treatment, and would recommend he meet with neurosurgery as well as move forward with simulation. The patient is interested in moving forward with this treatment. We will contact him for neurosurgery appointments as well as simulation. We discussed the delivery and logistics of radiotherapy and one fraction of this. We also discussed the role of radiotherapy to the chest and discusses that we could consider palliative radiotherapy as well to the chest. This can be considered at a later time and up to Dr. Julien Frank if he would like [place] to move forward with treatment to the chest.  We discussed the options of starting dexamethasone following SRS, and sooner if he becomes more symptomatic. He will meet with Dr. Julien Frank as well this week to discuss options systemic therapy.     Carola Rhine, PAC  and   Tyler Pita, MD [facility] Medical Director and Director of Stereotactic Radiosurgery Direct Dial: [phone]  Fax: [phone] [email or web address]  Skype  [email or web address]  This document serves as a record of services personally performed by Tyler Pita, MD and Shona Simpson, PA-C. It was created on their behalf by Rae Lips, a trained medical scribe. The creation of this record is based on the scribe's personal observations and the providers' statements to them. This document has been checked and approved by the attending providers.

## 2016-11-09 ENCOUNTER — Other Ambulatory Visit (HOSPITAL_BASED_OUTPATIENT_CLINIC_OR_DEPARTMENT_OTHER): Payer: Medicare Other

## 2016-11-09 ENCOUNTER — Ambulatory Visit (HOSPITAL_BASED_OUTPATIENT_CLINIC_OR_DEPARTMENT_OTHER): Payer: Medicare Other | Admitting: Internal Medicine

## 2016-11-09 ENCOUNTER — Telehealth: Payer: Self-pay | Admitting: Internal Medicine

## 2016-11-09 ENCOUNTER — Encounter: Payer: Self-pay | Admitting: Internal Medicine

## 2016-11-09 VITALS — BP 99/46 | HR 46 | Temp 97.7°F | Resp 18 | Ht 71.0 in | Wt 180.3 lb

## 2016-11-09 DIAGNOSIS — Z7189 Other specified counseling: Secondary | ICD-10-CM

## 2016-11-09 DIAGNOSIS — R5383 Other fatigue: Secondary | ICD-10-CM

## 2016-11-09 DIAGNOSIS — C3492 Malignant neoplasm of unspecified part of left bronchus or lung: Secondary | ICD-10-CM

## 2016-11-09 DIAGNOSIS — C7931 Secondary malignant neoplasm of brain: Secondary | ICD-10-CM | POA: Diagnosis not present

## 2016-11-09 DIAGNOSIS — R5382 Chronic fatigue, unspecified: Secondary | ICD-10-CM

## 2016-11-09 DIAGNOSIS — Z5111 Encounter for antineoplastic chemotherapy: Secondary | ICD-10-CM

## 2016-11-09 LAB — COMPREHENSIVE METABOLIC PANEL
ALBUMIN: 2.9 g/dL — AB (ref 3.5–5.0)
ALT: 39 U/L (ref 0–55)
ANION GAP: 8 meq/L (ref 3–11)
AST: 45 U/L — AB (ref 5–34)
Alkaline Phosphatase: 113 U/L (ref 40–150)
BILIRUBIN TOTAL: 0.52 mg/dL (ref 0.20–1.20)
BUN: 17.3 mg/dL (ref 7.0–26.0)
CALCIUM: 9 mg/dL (ref 8.4–10.4)
CO2: 23 mEq/L (ref 22–29)
CREATININE: 1 mg/dL (ref 0.7–1.3)
Chloride: 106 mEq/L (ref 98–109)
EGFR: 73 mL/min/{1.73_m2} — AB (ref >90)
Glucose: 197 mg/dl — ABNORMAL HIGH (ref 70–140)
Potassium: 4.8 mEq/L (ref 3.5–5.1)
SODIUM: 137 meq/L (ref 136–145)
TOTAL PROTEIN: 6.2 g/dL — AB (ref 6.4–8.3)

## 2016-11-09 LAB — CBC WITH DIFFERENTIAL/PLATELET
BASO%: 0.6 % (ref 0.0–2.0)
Basophils Absolute: 0.1 10*3/uL (ref 0.0–0.1)
EOS ABS: 0.6 10*3/uL — AB (ref 0.0–0.5)
EOS%: 5.5 % (ref 0.0–7.0)
HCT: 38.6 % (ref 38.4–49.9)
HEMOGLOBIN: 12.6 g/dL — AB (ref 13.0–17.1)
LYMPH%: 10.9 % — ABNORMAL LOW (ref 14.0–49.0)
MCH: 31.2 pg (ref 27.2–33.4)
MCHC: 32.6 g/dL (ref 32.0–36.0)
MCV: 95.5 fL (ref 79.3–98.0)
MONO#: 0.7 10*3/uL (ref 0.1–0.9)
MONO%: 6.2 % (ref 0.0–14.0)
NEUT%: 76.8 % — ABNORMAL HIGH (ref 39.0–75.0)
NEUTROS ABS: 8.4 10*3/uL — AB (ref 1.5–6.5)
Platelets: 175 10*3/uL (ref 140–400)
RBC: 4.04 10*6/uL — AB (ref 4.20–5.82)
RDW: 15.1 % — AB (ref 11.0–14.6)
WBC: 11 10*3/uL — AB (ref 4.0–10.3)
lymph#: 1.2 10*3/uL (ref 0.9–3.3)

## 2016-11-09 MED ORDER — PROCHLORPERAZINE MALEATE 10 MG PO TABS: 10.0000 mg | ORAL_TABLET | Freq: Four times a day (QID) | ORAL | 0 refills | Status: AC | PRN

## 2016-11-09 MED ORDER — LIDOCAINE-PRILOCAINE 2.5-2.5 % EX CREA: 1.0000 "application " | TOPICAL_CREAM | CUTANEOUS | 0 refills | Status: DC | PRN

## 2016-11-09 NOTE — Telephone Encounter (Signed)
Mailed calendar of upcoming appointments to patient.

## 2016-11-09 NOTE — Progress Notes (Signed)
START ON PATHWAY REGIMEN - Non-Small Cell Lung     A cycle is every 21 days:     Paclitaxel      Carboplatin   **Always confirm dose/schedule in your pharmacy ordering system**    Patient Characteristics: Stage IV Metastatic, Squamous, PS = 0, 1, First Line, PD-L1 Expression Positive 1-49% (TPS) / Negative / Not Tested / Not a Candidate for Immunotherapy/Awaiting Test Results AJCC T Category: T1b Current Disease Status: Distant Metastases AJCC N Category: N3 AJCC M Category: M1c AJCC 8 Stage Grouping: IVB Histology: Squamous Cell Line of therapy: First Line PD-L1 Expression Status: PD-L1 Negative Performance Status: PS = 0, 1 Would you be surprised if this patient died  in the next year<= I would NOT be surprised if this patient died in the next year Intent of Therapy: Non-Curative / Palliative Intent, Discussed with Patient

## 2016-11-09 NOTE — Progress Notes (Signed)
[facility] Telephone:(336) [phone]   Fax:(336) [phone]  OFFICE PROGRESS NOTE  [name], MD [address] [place] [address]  DIAGNOSIS: Stage IV (T1b, N3, M1c) non-small cell lung cancer, squamous cell carcinoma diagnosed in September 2018 presented with left infrahilar mass in addition to mediastinal and supraclavicular lymphadenopathy as well as metastatic brain lesion.  PDL1 expression 0%.  PRIOR THERAPY: None  CURRENT THERAPY: Systemic chemotherapy with carboplatin for AUC of 5, paclitaxel 175 MG/M2 and Ketruda (pembrolizumab) 200 MG IV every 3 weeks. First dose 11/21/2016.  INTERVAL HISTORY: Steve Frank 83 y.o. male returns to the clinic today for follow-up visit accompanied by his wife. The patient has no complaints today except for fatigue and balance issues. He had a recent MRI of the brain that showed increase in the size of the brain metastases and he is expected to undergo radiotherapy to the brain lesion under the care of Dr. Tammi Klippel soon. The patient denied having any current chest pain but has shortness breath with exertion with no cough or hemoptysis. He denied having any fever or chills. He has no nausea, vomiting, diarrhea or constipation. PDL 1 test was reported to be negative. The patient is here today for evaluation and discussion of his treatment options.  MEDICAL HISTORY: Past Medical History:  Diagnosis Date  . Arthritis   . BPH (benign prostatic hyperplasia)   . CAD (coronary artery disease)    s/p 2 cabg's  . Cancer (HCC)    Basal and squamous cell  skin cancer  . Cerebellar mass   . Chest mass   . Diabetes ([place])    Type II  . GERD (gastroesophageal reflux disease)   . Head injury with loss of consciousness [facility])    age 76 or 29-   . Heart attack ([place])   . HLD (hyperlipidemia)   . Hypertension   . Macular degeneration   . Pneumonia     x 2 last 1071  . Stage IV squamous cell carcinoma of left lung ([place]) 10/20/2016      ALLERGIES:  is allergic to ciprofloxacin.  MEDICATIONS:  Current Outpatient Prescriptions  Medication Sig Dispense Refill  . acetaminophen (TYLENOL) 325 MG tablet Take 650 mg by mouth every 6 (six) hours as needed (for pain/headaches.).     Marland Kitchen albuterol (PROVENTIL HFA;VENTOLIN HFA) 108 (90 Base) MCG/ACT inhaler Inhale 2 puffs into the lungs every 6 (six) hours as needed (for wheezing/shortness of breath).     Marland Kitchen amLODipine (NORVASC) 5 MG tablet Take 5 mg by mouth daily.     . B-D INS SYR ULTRAFINE 1CC/31G 31G X 5/16" 1 ML MISC     . carvedilol (COREG) 25 MG tablet Take 25 mg by mouth 2 (two) times daily with a meal.     . Cholecalciferol (VITAMIN D-1000 MAX ST) 1000 units tablet Take 1,000 Units by mouth daily.     . clotrimazole-betamethasone (LOTRISONE) cream Apply 1 application topically 2 (two) times daily.    Marland Kitchen docusate sodium (COLACE) 100 MG capsule Take 100 mg by mouth 2 (two) times daily as needed (for constipation.).    Marland Kitchen gabapentin (NEURONTIN) 400 MG capsule Take 400 mg by mouth at bedtime.    Marland Kitchen HUMALOG 100 UNIT/ML injection INJECT 2 TO 0.12MLS (2-8 UNITS) UNDER THE SKIN 3 TIMES A DAY BEFORE MEALS, AS PER SLIDING SCALE  0  . LANTUS 100 UNIT/ML injection INJECT 0.15ML (15 UNITS TOTAL) UNDER THE SKIN DAILY AT  BEDTIME.  0  . magnesium hydroxide (MILK OF MAGNESIA) 400 MG/5ML suspension Take 30 mLs by mouth daily as needed (for constipation.).    Marland Kitchen mirtazapine (REMERON) 15 MG tablet Take 15 mg by mouth at bedtime.  0  . Multiple Vitamin (MULTIVITAMIN WITH MINERALS) TABS tablet Take 1 tablet by mouth daily.    Marland Kitchen neomycin-bacitracin-polymyxin (NEOSPORIN) [phone] ointment Apply 1 application topically at bedtime.    Marland Kitchen omeprazole (PRILOSEC) 20 MG capsule Take 20 mg by mouth at bedtime.     . polyethylene glycol (MIRALAX / GLYCOLAX) packet Take 17 g by mouth daily as needed (for constipation.).     Marland Kitchen rosuvastatin (CRESTOR) 20 MG tablet Take 20 mg by mouth daily.     No current  facility-administered medications for this visit.     SURGICAL HISTORY:  Past Surgical History:  Procedure Laterality Date  . CARDIAC CATHETERIZATION    . COLONOSCOPY W/ POLYPECTOMY    . CORONARY ARTERY BYPASS GRAFT     Dr Prescott G[facility] 07/2007  . CORONARY ARTERY BYPASS GRAFT     1st one was @ [facility] 26years ago 12/1991  . CRANIECTOMY FOR EXCISION OF BRAIN TUMOR INFRATENTORIAL / POSTERIOR FOSSA  08/05/2016   Dr Rebecka Apley  @ [facility]/Neurosurgery  . FLEXIBLE BRONCHOSCOPY  08/07/2016   Dr Gwenevere Ghazi  . LUNG BIOPSY N/A 10/14/2016   Procedure: LUNG BIOPSY;  Surgeon: Grace Isaac, MD;  Location: [facility] [place];  Service: Thoracic;  Laterality: N/A;  . VIDEO BRONCHOSCOPY WITH ENDOBRONCHIAL ULTRASOUND N/A 10/14/2016   Procedure: VIDEO BRONCHOSCOPY WITH ENDOBRONCHIAL ULTRASOUND;  Surgeon: Grace Isaac, MD;  Location: [facility];  Service: Thoracic;  Laterality: N/A;    REVIEW OF SYSTEMS:  Constitutional: positive for fatigue Eyes: negative Ears, nose, mouth, throat, and face: negative Respiratory: positive for dyspnea on exertion Cardiovascular: negative Gastrointestinal: negative Genitourinary:negative Integument/breast: negative Hematologic/lymphatic: negative Musculoskeletal:positive for muscle weakness Neurological: positive for coordination problems Behavioral/Psych: negative Endocrine: negative Allergic/Immunologic: negative   PHYSICAL EXAMINATION: General appearance: alert, cooperative, fatigued and no distress Head: Normocephalic, without obvious abnormality, atraumatic Neck: no adenopathy, no JVD, supple, symmetrical, trachea midline and thyroid not enlarged, symmetric, no tenderness/mass/nodules Lymph nodes: Cervical, supraclavicular, and axillary nodes normal. Resp: clear to auscultation bilaterally Back: symmetric, no curvature. ROM normal. No CVA tenderness. Cardio: regular rate and rhythm, S1, S2 normal, no murmur, click, rub or gallop GI: soft, non-tender; bowel  sounds normal; no masses,  no organomegaly Extremities: extremities normal, atraumatic, no cyanosis or edema Neurologic: Alert and oriented X 3, normal strength and tone. Normal symmetric reflexes. Normal coordination and gait  ECOG PERFORMANCE STATUS: 1 - Symptomatic but completely ambulatory  Blood pressure (!) 99/46, pulse (!) 46, temperature 97.7 F (36.5 C), temperature source Oral, resp. rate 18, height _0  (1.803 m), weight 180 lb 4.8 oz (81.8 kg), SpO2 98 %.  LABORATORY DATA: Lab Results  Component Value Date   WBC 11.0 (H) 11/09/2016   HGB 12.6 (L) 11/09/2016   HCT 38.6 11/09/2016   MCV 95.5 11/09/2016   PLT 175 11/09/2016      Chemistry      Component Value Date/Time   NA 137 11/09/2016 0733   K 4.8 11/09/2016 0733   CL 105 10/14/2016 0623   CO2 23 11/09/2016 0733   BUN 17.3 11/09/2016 0733   CREATININE 1.0 11/09/2016 0733      Component Value Date/Time   CALCIUM 9.0 11/09/2016 0733   ALKPHOS 113 11/09/2016 0733   AST 45 (H)  11/09/2016 0733   ALT 39 11/09/2016 0733   BILITOT 0.52 11/09/2016 0733       RADIOGRAPHIC STUDIES: Dg Chest 2 View  Result Date: 10/14/2016 CLINICAL DATA:  Lung mass. Preoperative for lung biopsy. History of hypertension. EXAM: CHEST  2 VIEW COMPARISON:  Chest 09/13/2016. PET-CT 09/20/2016. CT chest 08/04/2016 FINDINGS: Postoperative changes in the mediastinum. Normal heart size and pulmonary vascularity. Limited visualization of left hilar mass, better seen on previous CT. Left hilum appears mildly prominent. Infiltrate or atelectasis in the left lung base behind the heart. Small bilateral pleural effusions. No pneumothorax. Calcified and tortuous aorta. IMPRESSION: Left hilar mass with probable postobstructive change in the left lung base is better visualized on previous CT scan. Small bilateral pleural effusions. Electronically Signed   By: Lucienne Capers M.D.   On: 10/14/2016 06:27   Mr Jeri Cos [facility]Y Contrast  Result Date:  11/02/2016 CLINICAL DATA:  83 year old male originally diagnosed with left cerebellar mass in June, status post attempted resection but no definitive diagnosis was made. Recent PET-CT and bronchoscopic findings suggest stage IV non-small cell lung cancer, favoring squamous cell carcinoma. Restaging and treatment planning. Creatinine was obtained on site at [facility] at [address] Results: Creatinine 0.9 mg/dL. EXAM: MRI HEAD WITHOUT AND WITH CONTRAST TECHNIQUE: Multiplanar, multiecho pulse sequences of the brain and surrounding structures were obtained without and with intravenous contrast. CONTRAST:  56m MULTIHANCE GADOBENATE DIMEGLUMINE 529 MG/ML IV SOLN COMPARISON:  PET[facility]CT 09/20/2016. [facility]. FINDINGS: Brain: A 37 mm diameter rounded, intensely and at in the left ventral superior cerebellar sphere has increased since June, approximately 24 mm at that time. See series 10, image 49 today. There is mildly increased regional edema tracking into the dorsal left brainstem, and regional mass effect including on the dorsal left pons, the left cerebellar peduncle, and the left lateral aspect of the fourth ventricle (series 2, image 24). Basilar cisterns remain patent. No ventriculomegaly. Trace extra-axial hemorrhage along the undersurface of the left tentorium associated with smooth dural thickening. No other intracranial metastatic disease identified. Left occipital craniotomy site with more irregular appearing dural thickening. No restricted diffusion or evidence of acute infarction. No midline shift. Scattered and patchy nonspecific bilateral cerebral white matter T2 and FLAIR hyperintensity appears stable since June. No acute intracranial hemorrhage. Negative pituitary and cervicomedullary junction. Vascular: Major intracranial vascular flow voids are preserved. The major dural venous sinuses are enhancing and appear to remain  patent. Skull and upper cervical spine: Negative visualized cervical spine and spinal cord except for degenerative changes. Visible bone marrow signal is within normal limits. Sinuses/Orbits: Normal orbits soft tissues. Visualized paranasal sinuses and mastoids are stable and well pneumatized. Other: Visible internal auditory structures appear normal. Left suboccipital scalp postoperative soft tissue changes. No acute scalp soft tissue findings. IMPRESSION: 1. Solitary left anterior superior cerebellar mass has increased since June to now 37 mm diameter. Mildly progressed regional mass effect and edema, including involvement of the left dorsal pons and left cerebellar peduncles. 2. No new brain mass/metastasis or new intracranial abnormality identified. 3. Sequelae of left suboccipital craniotomy. Electronically Signed   By: HGenevie AnnM.D.   On: 11/02/2016 15:10    ASSESSMENT AND PLAN: This is a very pleasant 83years old white male with metastatic non-small cell lung cancer, squamous cell carcinoma with negative PDL 1 expression and enlarging solitary brain metastasis.  The patient is expected to undergo stereotactic radiotherapy under the  care of Dr. Tammi Klippel within the next 1-2 weeks. I had a lengthy discussion with the patient and his wife today about his current disease is stage, prognosis and treatment options. I explained to the patient that he has incurable condition and on the treatment will be of palliative nature. I gave the patient the option of palliative care and hospice referral versus consideration of palliative systemic chemotherapy with carboplatin for AUC of 5, paclitaxel 175 MG/M2 and Ketruda 200 MG IV every 3 weeks. I discussed with the patient adverse effect of this treatment including but not limited to alopecia, myelosuppression, nausea and vomiting, peripheral neuropathy, liver or renal dysfunction as well as the adverse effect of the immunotherapy including but not limited to skin rash,  diarrhea, inflammation of the lung, kidney, liver, thyroid or other endocrine dysfunction including diabetes mellitus type 1. The patient would like to proceed with systemic chemotherapy. He was given handouts about his treatment. He is expected to start the first cycle of this treatment on 11/21/2016. I will arrange for the patient to have a Port-A-Cath placed before his treatment. I will also arrange for the patient to have a chemotherapy education class before the first dose of his treatment. The patient would come back for follow-up visit before the first cycle of his treatment. I will call his pharmacy with prescription for Compazine 10 mg by mouth every 6 hours as needed for nausea in addition to Emla cream to be applied to the Port-A-Cath site before treatment. The patient was advised to call immediately if he has any concerning symptoms in the interval. The patient voices understanding of current disease status and treatment options and is in agreement with the current care plan. All questions were answered. The patient knows to call the clinic with any problems, questions or concerns. We can certainly see the patient much sooner if necessary.  I spent 20 minutes counseling the patient face to face. The total time spent in the appointment was 30 minutes.  Disclaimer: This note was dictated with voice recognition software. Similar sounding words can inadvertently be transcribed and may not be corrected upon review.

## 2016-11-10 ENCOUNTER — Ambulatory Visit
Admission: RE | Admit: 2016-11-10 | Discharge: 2016-11-10 | Disposition: A | Discharge status: Home / Self Care | Payer: Medicare Other | Source: Ambulatory Visit | Attending: Radiation Oncology | Admitting: Radiation Oncology

## 2016-11-10 DIAGNOSIS — C3492 Malignant neoplasm of unspecified part of left bronchus or lung: Secondary | ICD-10-CM

## 2016-11-10 DIAGNOSIS — C7931 Secondary malignant neoplasm of brain: Secondary | ICD-10-CM

## 2016-11-10 DIAGNOSIS — Z510 Encounter for antineoplastic radiation therapy: Secondary | ICD-10-CM | POA: Diagnosis not present

## 2016-11-10 MED ORDER — SODIUM CHLORIDE 0.9% FLUSH
10.0000 mL | Freq: Once | INTRAVENOUS | Status: AC
  Administered 2016-11-10: 10 mL via INTRAVENOUS

## 2016-11-10 NOTE — Progress Notes (Signed)
  Radiation Oncology         [phone] ________________________________  Name: Steve Frank MRN: [id number]  Date: 11/10/2016  DOB: Jan 08, 1932  SIMULATION AND TREATMENT PLANNING NOTE    ICD-10-CM   1. Solitary left cerebellar brain metastasis (HCC) C79.31     DIAGNOSIS:  83 yo man with a solitary 3.2 cm left cerebellar brain metastasis from   NARRATIVE:  The patient was brought to the [facility].  Identity was confirmed.  All relevant records and images related to the planned course of therapy were reviewed.  The patient freely provided informed written consent to proceed with treatment after reviewing the details related to the planned course of therapy. The consent form was witnessed and verified by the simulation staff. Intravenous access was established for contrast administration. Then, the patient was set-up in a stable reproducible supine position for radiation therapy.  A relocatable thermoplastic stereotactic head frame was fabricated for precise immobilization.  CT images were obtained.  Surface markings were placed.  The CT images were loaded into the planning software and fused with the patient's targeting MRI scan.  Then the target and avoidance structures were contoured.  Treatment planning then occurred.  The radiation prescription was entered and confirmed.  I have requested 3D planning  I have requested a DVH of the following structures: Brain stem, brain, left eye, right eye, lenses, optic chiasm, target volumes, uninvolved brain, and normal tissue.    SPECIAL TREATMENT PROCEDURE:  The planned course of therapy using radiation constitutes a special treatment procedure. Special care is required in the management of this patient for the following reasons. This treatment constitutes a Special Treatment Procedure for the following reason: High dose per fraction requiring special monitoring for increased toxicities of treatment including daily imaging.  The special  nature of the planned course of radiotherapy will require increased physician supervision and oversight to ensure patient's safety with optimal treatment outcomes.  PLAN:  The patient will receive 16 Gy in 1 fraction.  ________________________________  Sheral Apley Tammi Klippel, M.D.

## 2016-11-10 NOTE — Progress Notes (Signed)
  Radiation Oncology         [phone] ________________________________  Name: Steve Frank MRN: [id number]  Date: 11/10/2016  DOB: [birth year]  SIMULATION AND TREATMENT PLANNING NOTE    ICD-10-CM   1. Solitary left cerebellar brain metastasis (HCC) C79.31     DIAGNOSIS:  83 y.o. gentleman non-small cell carcinoma of the left lower lung with solitary brain metastasis  NARRATIVE:  The patient was brought to the [facility].  Identity was confirmed.  All relevant records and images related to the planned course of therapy were reviewed.  The patient freely provided informed written consent to proceed with treatment after reviewing the details related to the planned course of therapy. The consent form was witnessed and verified by the simulation staff.  Then, the patient was set-up in a stable reproducible  supine position for radiation therapy.  CT images were obtained.  Surface markings were placed.  The CT images were loaded into the planning software.  Then the target and avoidance structures were contoured.  Treatment planning then occurred.  The radiation prescription was entered and confirmed.  Then, I designed and supervised the construction of a total of 6 medically necessary complex treatment devices, including a BodyFix immobilization mold custom fitted to the patient along with 5 multileaf collimators conformally shaped radiation around the treatment target while shielding critical structures such as the heart and spinal cord maximally.  I have requested : 3D Simulation  I have requested a DVH of the following structures: Left lung, right lung, spinal cord, heart, esophagus, and target.  I have ordered:Nutrition Consult  SPECIAL TREATMENT PROCEDURE:  The planned course of therapy using radiation constitutes a special treatment procedure. Special care is required in the management of this patient for the following reasons.  The patient will be receiving concurrent  chemotherapy requiring careful monitoring for increased toxicities of treatment including periodic laboratory values.  The special nature of the planned course of radiotherapy will require increased physician supervision and oversight to ensure patient's safety with optimal treatment outcomes.  PLAN:  The patient will receive 66 Gy in 33 fractions.  ________________________________  Sheral Apley Tammi Klippel, M.D.  This document serves as a record of services personally performed by Tyler Pita MD. It was created on his behalf by Delton Coombes, a trained medical scribe. The creation of this record is based on the scribe's personal observations and the provider's statements to them. This document has been checked and approved by the attending provider.

## 2016-11-10 NOTE — Progress Notes (Signed)
Has armband been applied?  Yes.    Does patient have an allergy to IV contrast dye?: No.   Has patient ever received premedication for IV contrast dye?: No.   Does patient take metformin?: No.  If patient does take metformin when was the last dose: n/a  Date of lab work:     IV site: antecubital left, condition no redness  Has IV site been added to flowsheet?  Yes.    There were no vitals taken for this visit.

## 2016-11-15 ENCOUNTER — Other Ambulatory Visit: Payer: Self-pay | Admitting: Radiology

## 2016-11-16 ENCOUNTER — Other Ambulatory Visit: Payer: Self-pay | Admitting: Radiology

## 2016-11-16 DIAGNOSIS — Z510 Encounter for antineoplastic radiation therapy: Secondary | ICD-10-CM | POA: Diagnosis not present

## 2016-11-17 ENCOUNTER — Other Ambulatory Visit: Payer: Medicare Other

## 2016-11-17 ENCOUNTER — Other Ambulatory Visit: Payer: Self-pay | Admitting: General Surgery

## 2016-11-17 ENCOUNTER — Encounter: Payer: Self-pay | Admitting: Radiation Oncology

## 2016-11-17 ENCOUNTER — Ambulatory Visit
Admission: RE | Admit: 2016-11-17 | Discharge: 2016-11-17 | Disposition: A | Discharge status: Home / Self Care | Payer: Medicare Other | Source: Ambulatory Visit | Attending: Radiation Oncology | Admitting: Radiation Oncology

## 2016-11-17 ENCOUNTER — Encounter (HOSPITAL_COMMUNITY): Payer: Self-pay

## 2016-11-17 ENCOUNTER — Other Ambulatory Visit: Payer: Self-pay | Admitting: Radiation Therapy

## 2016-11-17 ENCOUNTER — Other Ambulatory Visit: Payer: Self-pay | Admitting: Internal Medicine

## 2016-11-17 ENCOUNTER — Ambulatory Visit (HOSPITAL_COMMUNITY)
Admission: RE | Admit: 2016-11-17 | Discharge: 2016-11-17 | Disposition: A | Discharge status: Home / Self Care | Payer: Medicare Other | Source: Ambulatory Visit | Attending: Internal Medicine | Admitting: Internal Medicine

## 2016-11-17 ENCOUNTER — Encounter: Payer: Self-pay | Admitting: *Deleted

## 2016-11-17 ENCOUNTER — Other Ambulatory Visit: Payer: Self-pay | Admitting: Radiology

## 2016-11-17 VITALS — BP 145/61 | HR 46 | Temp 97.7°F | Resp 18

## 2016-11-17 DIAGNOSIS — E119 Type 2 diabetes mellitus without complications: Secondary | ICD-10-CM | POA: Insufficient documentation

## 2016-11-17 DIAGNOSIS — I252 Old myocardial infarction: Secondary | ICD-10-CM | POA: Diagnosis not present

## 2016-11-17 DIAGNOSIS — C7931 Secondary malignant neoplasm of brain: Secondary | ICD-10-CM

## 2016-11-17 DIAGNOSIS — I2510 Atherosclerotic heart disease of native coronary artery without angina pectoris: Secondary | ICD-10-CM | POA: Diagnosis not present

## 2016-11-17 DIAGNOSIS — H3530 Unspecified macular degeneration: Secondary | ICD-10-CM | POA: Diagnosis not present

## 2016-11-17 DIAGNOSIS — I10 Essential (primary) hypertension: Secondary | ICD-10-CM | POA: Insufficient documentation

## 2016-11-17 DIAGNOSIS — E785 Hyperlipidemia, unspecified: Secondary | ICD-10-CM | POA: Diagnosis not present

## 2016-11-17 DIAGNOSIS — K219 Gastro-esophageal reflux disease without esophagitis: Secondary | ICD-10-CM | POA: Insufficient documentation

## 2016-11-17 DIAGNOSIS — Z951 Presence of aortocoronary bypass graft: Secondary | ICD-10-CM | POA: Insufficient documentation

## 2016-11-17 DIAGNOSIS — C3492 Malignant neoplasm of unspecified part of left bronchus or lung: Secondary | ICD-10-CM

## 2016-11-17 DIAGNOSIS — Z538 Procedure and treatment not carried out for other reasons: Secondary | ICD-10-CM | POA: Diagnosis not present

## 2016-11-17 DIAGNOSIS — C3490 Malignant neoplasm of unspecified part of unspecified bronchus or lung: Secondary | ICD-10-CM | POA: Diagnosis present

## 2016-11-17 DIAGNOSIS — Z87891 Personal history of nicotine dependence: Secondary | ICD-10-CM | POA: Insufficient documentation

## 2016-11-17 DIAGNOSIS — Z510 Encounter for antineoplastic radiation therapy: Secondary | ICD-10-CM | POA: Diagnosis not present

## 2016-11-17 LAB — CBC WITH DIFFERENTIAL/PLATELET
BASOS ABS: 0.1 10*3/uL (ref 0.0–0.1)
BASOS PCT: 1 %
EOS PCT: 9 %
Eosinophils Absolute: 0.7 10*3/uL (ref 0.0–0.7)
HCT: 40.9 % (ref 39.0–52.0)
Hemoglobin: 13.8 g/dL (ref 13.0–17.0)
LYMPHS PCT: 19 %
Lymphs Abs: 1.5 10*3/uL (ref 0.7–4.0)
MCH: 31.2 pg (ref 26.0–34.0)
MCHC: 33.7 g/dL (ref 30.0–36.0)
MCV: 92.5 fL (ref 78.0–100.0)
Monocytes Absolute: 0.7 10*3/uL (ref 0.1–1.0)
Monocytes Relative: 8 %
Neutro Abs: 5.2 10*3/uL (ref 1.7–7.7)
Neutrophils Relative %: 63 %
PLATELETS: 203 10*3/uL (ref 150–400)
RBC: 4.42 MIL/uL (ref 4.22–5.81)
RDW: 14.6 % (ref 11.5–15.5)
WBC: 8.1 10*3/uL (ref 4.0–10.5)

## 2016-11-17 LAB — PROTIME-INR
INR: 0.98
Prothrombin Time: 12.9 seconds (ref 11.4–15.2)

## 2016-11-17 LAB — GLUCOSE, CAPILLARY: GLUCOSE-CAPILLARY: 228 mg/dL — AB (ref 65–99)

## 2016-11-17 MED ORDER — CEFAZOLIN SODIUM-DEXTROSE 2-4 GM/100ML-% IV SOLN: 2.0000 g | INTRAVENOUS | Status: DC

## 2016-11-17 MED ORDER — ACETAMINOPHEN 500 MG PO TABS
1000.0000 mg | ORAL_TABLET | Freq: Once | ORAL | Status: AC
  Administered 2016-11-17: 1000 mg via ORAL
  Filled 2016-11-17: qty 2

## 2016-11-17 MED ORDER — ONDANSETRON HCL 4 MG/2ML IJ SOLN
4.0000 mg | Freq: Once | INTRAMUSCULAR | Status: AC
  Administered 2016-11-17: 4 mg via INTRAVENOUS
  Filled 2016-11-17: qty 2

## 2016-11-17 MED ORDER — SODIUM CHLORIDE 0.9 % IV SOLN
INTRAVENOUS | Status: DC
  Administered 2016-11-17: 13:00:00 via INTRAVENOUS

## 2016-11-17 NOTE — Progress Notes (Signed)
Vitals stable. Denies pain. Patient denies taking decadron at this time and wife confirms. Denies headache, dizziness, nausea, diplopia or tinnitus. Instructed patient to avoid strenuous activity for the next 24 hours and call [phone] with needs. Patient and wife verbalized understanding. Wheeled patient to [employer] for port a cath placement.   BP (!) 145/61 (BP Location: Left Arm, Patient Position: Sitting, Cuff Size: Normal)   Pulse (!) 46   Temp 97.7 F (36.5 C) (Oral)   Resp 18   SpO2 98%  Wt Readings from Last 3 Encounters:  11/09/16 180 lb 4.8 oz (81.8 kg)  11/07/16 171 lb (77.6 kg)  10/20/16 185 lb 14.4 oz (84.3 kg)

## 2016-11-17 NOTE — Progress Notes (Addendum)
Pt had no sedation, procedure not done due to power outage, pt was told to come back tomorrow for port placement at 1400 11/18/16.  Pt had a headache and nausea from his radiation today. Dr. Laurence Ferrari states to given tylenol and zofran and pt may be d/c when he feels better.  Tylenol and zofran were given at 1605

## 2016-11-17 NOTE — Progress Notes (Addendum)
  Radiation Oncology         [phone] ________________________________  Stereotactic Treatment Procedure Note  Name: Steve Frank MRN: [id number]  Date: 11/17/2016  DOB: 10/27/1931  SPECIAL TREATMENT PROCEDURE    ICD-10-CM   1. Solitary left cerebellar brain metastasis (HCC) C79.31     3D TREATMENT PLANNING AND DOSIMETRY:  The patient's radiation plan was reviewed and approved by neurosurgery and radiation oncology prior to treatment.  It showed 3-dimensional radiation distributions overlaid onto the planning CT/MRI image set.  The [facility] for the target structures as well as the organs at risk were reviewed. The documentation of the 3D plan and dosimetry are filed in the radiation oncology EMR.  NARRATIVE:  Steve Frank was brought to the TrueBeam stereotactic radiation treatment machine and placed supine on the CT couch. The head frame was applied, and the patient was set up for stereotactic radiosurgery.  Neurosurgery was present for the set-up and delivery  SIMULATION VERIFICATION:  In the couch zero-angle position, the patient underwent Exactrac imaging using the Brainlab system with orthogonal KV images.  These were carefully aligned and repeated to confirm treatment position for each of the isocenters.  The Exactrac snap film verification was repeated at each couch angle.  PROCEDURE: Steve Frank received stereotactic radiosurgery to the following targets: Left cerebellar target was treated using 3 VMAT RapidArcs to a prescription dose of 16 Gy.  ExacTrac registration was performed for each couch angle.  The 100% isodose line was prescribed.  6 MV X-rays were delivered in the flattening filter free beam mode.  STEREOTACTIC TREATMENT MANAGEMENT:  Following delivery, the patient was transported to nursing in stable condition and monitored for possible acute effects.  Vital signs were recorded BP (!) 145/61 (BP Location: Left Arm, Patient Position: Sitting, Cuff Size: Normal)    Pulse (!) 46   Temp 97.7 F (36.5 C) (Oral)   Resp 18   SpO2 98% . The patient tolerated treatment without significant acute effects, and was discharged to home in stable condition.    PLAN: Follow-up in one month.  ________________________________  Steve Frank. Tammi Klippel, M.D.    This document serves as a record of services personally performed by Tyler Pita MD. It was created on his behalf by Delton Coombes, a trained medical scribe. The creation of this record is based on the scribe's personal observations and the provider's statements to them. This document has been checked and approved by the attending provider.

## 2016-11-17 NOTE — H&P (Signed)
Chief Complaint:Stage IV (T1b, N3, M1c) non-small cell lung cancer   Referring Physician:Dr. Curt Bears  Supervising Physician: Jacqulynn Cadet  Patient Status: [facility] - Out-pt  HPI: Steve Frank is a 83 y.o. male who has been diagnosed with non-smell cell lung cancer who is followed by Dr. Julien Nordmann and Dr. Tammi Klippel.  He has mets to his brian and is currently receiving radiation for this.  He begins radiation to his chest and chemotherapy on Monday.  He presents today for [facility] placement.  He is tired, but has no other acute complaints.  He has some SOB that is chronic for him.  He also has a lose of coordination at times of his LUE secondary to the met in his brain.  Past Medical History:  Past Medical History:  Diagnosis Date  . Arthritis   . BPH (benign prostatic hyperplasia)   . CAD (coronary artery disease)    s/p 2 cabg's  . Cancer (HCC)    Basal and squamous cell  skin cancer  . Cerebellar mass   . Chest mass   . Diabetes ([place])    Type II  . GERD (gastroesophageal reflux disease)   . Head injury with loss of consciousness [facility])    age 67 or 56-   . Heart attack ([place])   . HLD (hyperlipidemia)   . Hypertension   . Macular degeneration   . Pneumonia     x 2 last 1071  . Stage IV squamous cell carcinoma of left lung ([place]) 10/20/2016    Past Surgical History:  Past Surgical History:  Procedure Laterality Date  . CARDIAC CATHETERIZATION    . COLONOSCOPY W/ POLYPECTOMY    . CORONARY ARTERY BYPASS GRAFT     Dr Prescott [facility] 07/2007  . CORONARY ARTERY BYPASS GRAFT     1st one was @ [facility] 26years ago 12/1991  . CRANIECTOMY FOR EXCISION OF BRAIN TUMOR INFRATENTORIAL / POSTERIOR FOSSA  08/05/2016   Dr Rebecka Apley  @ [facility]/Neurosurgery  . FLEXIBLE BRONCHOSCOPY  08/07/2016   Dr Gwenevere Ghazi  . LUNG BIOPSY N/A 10/14/2016   Procedure: LUNG BIOPSY;  Surgeon: Grace Isaac, MD;  Location: [facility] OR;  Service: Thoracic;  Laterality: N/A;  . VIDEO BRONCHOSCOPY WITH  ENDOBRONCHIAL ULTRASOUND N/A 10/14/2016   Procedure: VIDEO BRONCHOSCOPY WITH ENDOBRONCHIAL ULTRASOUND;  Surgeon: Grace Isaac, MD;  Location: [facility] OR;  Service: Thoracic;  Laterality: N/A;    Family History:  Family History  Problem Relation Age of Onset  . Cancer Brother        lung to head and neck involving lymph nodes  . Cancer Paternal Aunt        breast  . Cancer Paternal Uncle        leukemia  . Cancer Paternal Aunt        breast  . Cancer Paternal Uncle        lung    Social History:  reports that he quit smoking about 31 years ago. His smoking use included Cigarettes. He has a 70.00 pack-year smoking history. He has quit using smokeless tobacco. He reports that he does not drink alcohol or use drugs.  Allergies:  Allergies  Allergen Reactions  . Ciprofloxacin Nausea And Vomiting    Medications: Medications reviewed in epic  Please HPI for pertinent positives, otherwise complete 10 system ROS negative.  Mallampati Score: MD Evaluation Airway: WNL Heart: WNL Abdomen: WNL Chest/ Lungs: WNL ASA  Classification: 3 Mallampati/Airway Score: One  Physical Exam: BP (!) 178/61   Pulse (!) 46   Temp 97.7 F (36.5 C) (Oral)   Resp 18   SpO2 100%  There is no height or weight on file to calculate BMI. General: pleasant, WD, WN white male who is laying in bed in NAD HEENT: head is normocephalic, atraumatic.  Sclera are noninjected.  PERRL.  Ears and nose without any masses or lesions.  Mouth is pink and moist.   Heart: regular, rate, and rhythm.  Normal s1,s2. No obvious murmurs, gallops, or rubs noted.  Palpable radial pulses bilaterally Lungs: CTAB, no wheezes, rhonchi, or rales noted.  Respiratory effort nonlabored Abd: soft, NT, ND, +BS, no masses, hernias, or organomegaly Psych: A&Ox3 with an appropriate affect.   Labs: Results for orders placed or performed during the hospital encounter of 11/17/16 (from the past 48 hour(s))  Glucose, capillary     Status:  Abnormal   Collection Time: 11/17/16  1:05 PM  Result Value Ref Range   Glucose-Capillary 228 (H) 65 - 99 mg/dL    Imaging: No results found.  Assessment/Plan 1. Lung cancer  We will plan to place a PAC today.  Vitals reviewed.  Labs are currently pending.  Risks and benefits discussed with the patient including, but not limited to bleeding, infection, pneumothorax, or fibrin sheath development and need for additional procedures. All of the patient's questions were answered, patient is agreeable to proceed. Consent signed and in chart.  Thank you for this interesting consult.  I greatly enjoyed meeting Steve Frank and look forward to participating in their care.  A copy of this report was sent to the requesting provider on this date.  Electronically Signed: Henreitta Cea 11/17/2016, 1:50 PM   I spent a total of  30 Minutes   in face to face in clinical consultation, greater than 50% of which was counseling/coordinating care for lung cancer

## 2016-11-17 NOTE — Op Note (Signed)
  Name: Steve Frank  MRN: [id number]  Date: 11/17/2016   DOB: 07/15/1931  Stereotactic Radiosurgery Operative Note  PRE-OPERATIVE DIAGNOSIS:  Multiple Brain Metastases  POST-OPERATIVE DIAGNOSIS:  Multiple Brain Metastases  PROCEDURE:  Stereotactic Radiosurgery  SURGEON:  Kevan Ny Tyris Eliot, MD  NARRATIVE: The patient underwent a radiation treatment planning session in the radiation oncology simulation suite under the care of the radiation oncology physician and physicist.  I participated closely in the radiation treatment planning afterwards. The patient underwent planning CT which was fused to 3T high resolution MRI with 1 mm axial slices.  These images were fused on the planning system.  We contoured the gross target volumes and subsequently expanded this to yield the Planning Target Volume. I actively participated in the planning process.  I helped to define and review the target contours and also the contours of the optic pathway, eyes, brainstem and selected nearby organs at risk.  All the dose constraints for critical structures were reviewed and compared to AAPM Task Group 101.  The prescription dose conformity was reviewed.  I approved the plan electronically.    Accordingly, Steve Frank was brought to the TrueBeam stereotactic radiation treatment linac and placed in the custom immobilization mask.  The patient was aligned according to the IR fiducial markers with BrainLab Exactrac, then orthogonal x-rays were used in ExacTrac with the 6DOF robotic table and the shifts were made to align the patient  Steve Frank received stereotactic radiosurgery uneventfully.    Lesions treated:  1   Complex lesions treated:  1 (>3.5 cm, <36mm of optic path, or within the brainstem)   The detailed description of the procedure is recorded in the radiation oncology procedure note.  I was present for the duration of the procedure.  DISPOSITION:  Following delivery, the patient was transported  to nursing in stable condition and monitored for possible acute effects to be discharged to home in stable condition with follow-up in one month.  Kevan Ny Ayoub Arey, MD 11/17/2016 11:53 AM

## 2016-11-17 NOTE — Discharge Instructions (Signed)
Moderate Conscious Sedation, Adult, Care After These instructions provide you with information about caring for yourself after your procedure. Your health care provider may also give you more specific instructions. Your treatment has been planned according to current medical practices, but problems sometimes occur. Call your health care provider if you have any problems or questions after your procedure. What can I expect after the procedure? After your procedure, it is common:  To feel sleepy for several hours.  To feel clumsy and have poor balance for several hours.  To have poor judgment for several hours.  To vomit if you eat too soon.  Follow these instructions at home: For at least 24 hours after the procedure:   Do not: ? Participate in activities where you could fall or become injured. ? Drive. ? Use heavy machinery. ? Drink alcohol. ? Take sleeping pills or medicines that cause drowsiness. ? Make important decisions or sign legal documents. ? Take care of children on your own.  Rest. Eating and drinking  Follow the diet recommended by your health care provider.  If you vomit: ? Drink water, juice, or soup when you can drink without vomiting. ? Make sure you have little or no nausea before eating solid foods. General instructions  Have a responsible adult stay with you until you are awake and alert.  Take over-the-counter and prescription medicines only as told by your health care provider.  If you smoke, do not smoke without supervision.  Keep all follow-up visits as told by your health care provider. This is important. Contact a health care provider if:  You keep feeling nauseous or you keep vomiting.  You feel light-headed.  You develop a rash.  You have a fever. Get help right away if:  You have trouble breathing. This information is not intended to replace advice given to you by your health care provider. Make sure you discuss any questions you have  with your health care provider. Document Released: 11/14/2012 Document Revised: 06/29/2015 Document Reviewed: 05/16/2015 Elsevier Interactive Patient Education  2018 [place] Insertion Implanted port insertion is a procedure to put in a port and catheter. The port is a device with an injectable disk that can be accessed by your health care provider. The port is connected to a vein in the chest or neck by a small flexible tube (catheter). There are different types of ports. The implanted port may be used as a long-term IV access for:  Medicines, such as chemotherapy.  Fluids.  Liquid nutrition, such as total parenteral nutrition (TPN).  Blood samples.  Having a port means that your health care provider will not need to use the veins in your arms for these procedures. Tell a health care provider about:  Any allergies you have.  All medicines you are taking, especially blood thinners, as well as any vitamins, herbs, eye drops, creams, over-the-counter medicines, and steroids.  Any problems you or family members have had with anesthetic medicines.  Any blood disorders you have.  Any surgeries you have had.  Any medical conditions you have, including diabetes or kidney problems.  Whether you are pregnant or may be pregnant. What are the risks? Generally, this is a safe procedure. However, problems may occur, including:  Allergic reactions to medicines or dyes.  Damage to other structures or organs.  Infection.  Damage to the blood vessel, bruising, or bleeding at the puncture site.  Blood clot.  Breakdown of the skin over the port.  A collection  of air in the chest that can cause one of the lungs to collapse (pneumothorax). This is rare.  What happens before the procedure? Staying hydrated Follow instructions from your health care provider about hydration, which may include:  Up to 2 hours before the procedure - you may continue to drink clear  liquids, such as water, clear fruit juice, black coffee, and plain tea.  Eating and drinking restrictions  Follow instructions from your health care provider about eating and drinking, which may include: ? 8 hours before the procedure - stop eating heavy meals or foods such as meat, fried foods, or fatty foods. ? 6 hours before the procedure - stop eating light meals or foods, such as toast or cereal. ? 6 hours before the procedure - stop drinking milk or drinks that contain milk. ? 2 hours before the procedure - stop drinking clear liquids. Medicines  Ask your health care provider about: ? Changing or stopping your regular medicines. This is especially important if you are taking diabetes medicines or blood thinners. ? Taking medicines such as aspirin and ibuprofen. These medicines can thin your blood. Do not take these medicines before your procedure if your health care provider instructs you not to.  You may be given antibiotic medicine to help prevent infection. General instructions  Plan to have someone take you home from the hospital or clinic.  If you will be going home right after the procedure, plan to have someone with you for 24 hours.  You may have blood tests.  You may be asked to shower with a germ-killing soap. What happens during the procedure?  To lower your risk of infection: ? Your health care team will wash or sanitize their hands. ? Your skin will be washed with soap. ? Hair may be removed from the surgical area.  An IV tube will be inserted into one of your veins.  You will be given one or more of the following: ? A medicine to help you relax (sedative). ? A medicine to numb the area (local anesthetic).  Two small cuts (incisions) will be made to insert the port. ? One incision will be made in your neck to get access to the vein where the catheter will lie. ? The other incision will be made in the upper chest. This is where the port will lie.  The  procedure may be done using continuous X-ray (fluoroscopy) or other imaging tools for guidance.  The port and catheter will be placed. There may be a small, raised area where the port is.  The port will be flushed with a salt solution (saline), and blood will be drawn to make sure that it is working correctly.  The incisions will be closed.  Bandages (dressings) may be placed over the incisions. The procedure may vary among health care providers and hospitals. What happens after the procedure?  Your blood pressure, heart rate, breathing rate, and blood oxygen level will be monitored until the medicines you were given have worn off.  Do not drive for 24 hours if you were given a sedative.  You will be given a manufacturer's information card for the type of port that you have. Keep this with you.  Your port will need to be flushed and checked as told by your health care provider, usually every few weeks.  A chest X-ray will be done to: ? Check the placement of the port. ? Make sure there is no injury to your lung. Summary  Implanted  port insertion is a procedure to put in a port and catheter.  The implanted port is used as a long-term IV access.  The port will need to be flushed and checked as told by your health care provider, usually every few weeks.  Keep your manufacturer's information card with you at all times. This information is not intended to replace advice given to you by your health care provider. Make sure you discuss any questions you have with your health care provider. Document Released: 11/14/2012 Document Revised: 12/16/2015 Document Reviewed: 12/16/2015 Elsevier Interactive Patient Education  2017 [employer].

## 2016-11-17 NOTE — Progress Notes (Signed)
Pt feels better, he will be d/c at this time.

## 2016-11-18 ENCOUNTER — Other Ambulatory Visit: Payer: Self-pay | Admitting: Internal Medicine

## 2016-11-18 ENCOUNTER — Encounter (HOSPITAL_COMMUNITY): Payer: Self-pay

## 2016-11-18 ENCOUNTER — Ambulatory Visit (HOSPITAL_COMMUNITY)
Admission: RE | Admit: 2016-11-18 | Discharge: 2016-11-18 | Disposition: A | Discharge status: Home / Self Care | Payer: Medicare Other | Source: Ambulatory Visit | Attending: Internal Medicine | Admitting: Internal Medicine

## 2016-11-18 DIAGNOSIS — C3490 Malignant neoplasm of unspecified part of unspecified bronchus or lung: Secondary | ICD-10-CM | POA: Diagnosis not present

## 2016-11-18 DIAGNOSIS — C3492 Malignant neoplasm of unspecified part of left bronchus or lung: Secondary | ICD-10-CM

## 2016-11-18 HISTORY — PX: IR US GUIDE VASC ACCESS RIGHT: IMG2390

## 2016-11-18 HISTORY — PX: IR FLUORO GUIDE PORT INSERTION RIGHT: IMG5741

## 2016-11-18 LAB — GLUCOSE, CAPILLARY: GLUCOSE-CAPILLARY: 105 mg/dL — AB (ref 65–99)

## 2016-11-18 MED ORDER — LIDOCAINE HCL 1 % IJ SOLN
INTRAMUSCULAR | Status: AC | PRN
  Administered 2016-11-18: 15 mL

## 2016-11-18 MED ORDER — HEPARIN SOD (PORK) LOCK FLUSH 100 UNIT/ML IV SOLN
INTRAVENOUS | Status: AC | PRN
  Administered 2016-11-18: 500 [IU] via INTRAVENOUS

## 2016-11-18 MED ORDER — LIDOCAINE HCL 1 % IJ SOLN
INTRAMUSCULAR | Status: AC
  Filled 2016-11-18: qty 20

## 2016-11-18 MED ORDER — MIDAZOLAM HCL 2 MG/2ML IJ SOLN
INTRAMUSCULAR | Status: AC
  Filled 2016-11-18: qty 2

## 2016-11-18 MED ORDER — FENTANYL CITRATE (PF) 100 MCG/2ML IJ SOLN
INTRAMUSCULAR | Status: AC
  Filled 2016-11-18: qty 2

## 2016-11-18 MED ORDER — FENTANYL CITRATE (PF) 100 MCG/2ML IJ SOLN
INTRAMUSCULAR | Status: AC | PRN
  Administered 2016-11-18: 50 ug via INTRAVENOUS

## 2016-11-18 MED ORDER — HEPARIN SOD (PORK) LOCK FLUSH 100 UNIT/ML IV SOLN
INTRAVENOUS | Status: AC
  Filled 2016-11-18: qty 5

## 2016-11-18 MED ORDER — CEFAZOLIN SODIUM-DEXTROSE 2-4 GM/100ML-% IV SOLN
2.0000 g | INTRAVENOUS | Status: AC
  Administered 2016-11-18: 2 g via INTRAVENOUS

## 2016-11-18 MED ORDER — ACETAMINOPHEN 500 MG PO TABS
1000.0000 mg | ORAL_TABLET | Freq: Once | ORAL | Status: AC
  Administered 2016-11-18: 1000 mg via ORAL
  Filled 2016-11-18: qty 2

## 2016-11-18 MED ORDER — SODIUM CHLORIDE 0.9 % IV SOLN
INTRAVENOUS | Status: DC
  Administered 2016-11-18: 15:00:00 via INTRAVENOUS

## 2016-11-18 MED ORDER — MIDAZOLAM HCL 2 MG/2ML IJ SOLN
INTRAMUSCULAR | Status: AC | PRN
  Administered 2016-11-18: 1 mg via INTRAVENOUS

## 2016-11-18 MED ORDER — CEFAZOLIN SODIUM-DEXTROSE 2-4 GM/100ML-% IV SOLN
INTRAVENOUS | Status: AC
  Administered 2016-11-18: 2 g via INTRAVENOUS
  Filled 2016-11-18: qty 100

## 2016-11-18 NOTE — Procedures (Signed)
Interventional Radiology Procedure Note  Procedure: Placement of a right IJ approach single lumen PowerPort.  Tip is positioned at the superior cavoatrial junction and catheter is ready for immediate use.  Complications: None Recommendations:  - Ok to shower tomorrow - Do not submerge for 7 days - Routine line care   Signed,  Nakisha Chai S. Dudley Cooley, DO   

## 2016-11-18 NOTE — Discharge Instructions (Signed)
Implanted Port Home Guide °An implanted port is a type of central line that is placed under the skin. Central lines are used to provide IV access when treatment or nutrition needs to be given through a person’s veins. Implanted ports are used for long-term IV access. An implanted port may be placed because: °· You need IV medicine that would be irritating to the small veins in your hands or arms. °· You need long-term IV medicines, such as antibiotics. °· You need IV nutrition for a long period. °· You need frequent blood draws for lab tests. °· You need dialysis. ° °Implanted ports are usually placed in the chest area, but they can also be placed in the upper arm, the abdomen, or the leg. An implanted port has two main parts: °· Reservoir. The reservoir is round and will appear as a small, raised area under your skin. The reservoir is the part where a needle is inserted to give medicines or draw blood. °· Catheter. The catheter is a thin, flexible tube that extends from the reservoir. The catheter is placed into a large vein. Medicine that is inserted into the reservoir goes into the catheter and then into the vein. ° °How will I care for my incision site? °Do not get the incision site wet. Bathe or shower as directed by your health care provider. °How is my port accessed? °Special steps must be taken to access the port: °· Before the port is accessed, a numbing cream can be placed on the skin. This helps numb the skin over the port site. °· Your health care provider uses a sterile technique to access the port. °? Your health care provider must put on a mask and sterile gloves. °? The skin over your port is cleaned carefully with an antiseptic and allowed to dry. °? The port is gently pinched between sterile gloves, and a needle is inserted into the port. °· Only "non-coring" port needles should be used to access the port. Once the port is accessed, a blood return should be checked. This helps ensure that the port  is in the vein and is not clogged. °· If your port needs to remain accessed for a constant infusion, a clear (transparent) bandage will be placed over the needle site. The bandage and needle will need to be changed every week, or as directed by your health care provider. °· Keep the bandage covering the needle clean and dry. Do not get it wet. Follow your health care provider’s instructions on how to take a shower or bath while the port is accessed. °· If your port does not need to stay accessed, no bandage is needed over the port. ° °What is flushing? °Flushing helps keep the port from getting clogged. Follow your health care provider’s instructions on how and when to flush the port. Ports are usually flushed with saline solution or a medicine called heparin. The need for flushing will depend on how the port is used. °· If the port is used for intermittent medicines or blood draws, the port will need to be flushed: °? After medicines have been given. °? After blood has been drawn. °? As part of routine maintenance. °· If a constant infusion is running, the port may not need to be flushed. ° °How long will my port stay implanted? °The port can stay in for as long as your health care provider thinks it is needed. When it is time for the port to come out, surgery will be   done to remove it. The procedure is similar to the one performed when the port was put in. When should I seek immediate medical care? When you have an implanted port, you should seek immediate medical care if:  You notice a bad smell coming from the incision site.  You have swelling, redness, or drainage at the incision site.  You have more swelling or pain at the port site or the surrounding area.  You have a fever that is not controlled with medicine.  This information is not intended to replace advice given to you by your health care provider. Make sure you discuss any questions you have with your health care provider. Document  Released: 01/24/2005 Document Revised: 07/02/2015 Document Reviewed: 10/01/2012 Elsevier Interactive Patient Education  2017 [place]. Moderate Conscious Sedation, Adult, Care After These instructions provide you with information about caring for yourself after your procedure. Your health care provider may also give you more specific instructions. Your treatment has been planned according to current medical practices, but problems sometimes occur. Call your health care provider if you have any problems or questions after your procedure. What can I expect after the procedure? After your procedure, it is common:  To feel sleepy for several hours.  To feel clumsy and have poor balance for several hours.  To have poor judgment for several hours.  To vomit if you eat too soon.  Follow these instructions at home: For at least 24 hours after the procedure:   Do not: ? Participate in activities where you could fall or become injured. ? Drive. ? Use heavy machinery. ? Drink alcohol. ? Take sleeping pills or medicines that cause drowsiness. ? Make important decisions or sign legal documents. ? Take care of children on your own.  Rest. Eating and drinking  Follow the diet recommended by your health care provider.  If you vomit: ? Drink water, juice, or soup when you can drink without vomiting. ? Make sure you have little or no nausea before eating solid foods. General instructions  Have a responsible adult stay with you until you are awake and alert.  Take over-the-counter and prescription medicines only as told by your health care provider.  If you smoke, do not smoke without supervision.  Keep all follow-up visits as told by your health care provider. This is important. Contact a health care provider if:  You keep feeling nauseous or you keep vomiting.  You feel light-headed.  You develop a rash.  You have a fever. Get help right away if:  You have trouble  breathing. This information is not intended to replace advice given to you by your health care provider. Make sure you discuss any questions you have with your health care provider. Document Released: 11/14/2012 Document Revised: 06/29/2015 Document Reviewed: 05/16/2015 Elsevier Interactive Patient Education  [employer].

## 2016-11-18 NOTE — Progress Notes (Signed)
Patient ID: Steve Frank, male   DOB: 05-Sep-1931, 83 y.o.   MRN: [id number] Patient presents again today for Port-A-Cath placement for chemotherapy. He presented to the hospital yesterday for procedure but due to power outage had to be rescheduled for today. See recent H&P for additional medical history. His clinical status has not changed since exam yesterday. He does report some recurring visual changes/blurriness/double vision. He is awake, answering questions appropriately. Chest with slightly diminished breath sounds left base, right clear. Heart with bradycardic but regular rhythm.Will check EKG.  Abdomen soft, positive bowel sounds, nontender.Risks and benefits discussed with the patient/family  including, but not limited to bleeding, infection, pneumothorax, or fibrin sheath development and need for additional procedures.All of the patient's questions were answered, patient is agreeable to proceed.Consent signed and in chart.

## 2016-11-21 ENCOUNTER — Telehealth: Payer: Self-pay

## 2016-11-21 ENCOUNTER — Ambulatory Visit: Payer: Medicare Other

## 2016-11-21 ENCOUNTER — Ambulatory Visit
Admission: RE | Admit: 2016-11-21 | Discharge: 2016-11-21 | Disposition: A | Discharge status: Home / Self Care | Payer: Medicare Other | Source: Ambulatory Visit | Attending: Radiation Oncology | Admitting: Radiation Oncology

## 2016-11-21 ENCOUNTER — Ambulatory Visit: Payer: Medicare Other | Admitting: Oncology

## 2016-11-21 ENCOUNTER — Other Ambulatory Visit: Payer: Medicare Other

## 2016-11-21 DIAGNOSIS — Z510 Encounter for antineoplastic radiation therapy: Secondary | ICD-10-CM | POA: Diagnosis not present

## 2016-11-21 NOTE — Telephone Encounter (Signed)
Incoming telehealth report. Pt had taken dulcolax and had diarrhea. He then took imodium and it is slowing down. He went 4-5 times. So he is a little weak this AM. Wife is making sure pt drinks fluids today. He gets constipated b/c he cannot move around much. Wife states he will be at [facility] at 330 pm for XRT.

## 2016-11-21 NOTE — Progress Notes (Signed)
  Radiation Oncology         [phone]) [phone] ________________________________  Name: Steve Frank MRN: [id number]  Date: 11/17/2016  DOB: 1931-12-21  End of Treatment Note  Diagnosis:   83 y.o. 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   Beams/energy:   SBRT/SRT-3D // 6X-FFF Photon  Narrative: The patient tolerated radiation treatment relatively well.  He tolerated treatment without significant acute effects and was discharged to home in stable condition.     Plan: The patient has completed radiation treatment. The patient will return to radiation oncology clinic for routine followup in one month. I advised him to call or return sooner if he has any questions or concerns related to his recovery or treatment. ________________________________  Sheral Apley. Tammi Klippel, M.D.  This document serves as a record of services personally performed by Tyler Pita, MD. It was created on his behalf by Rae Lips, a trained medical scribe. The creation of this record is based on the scribe's personal observations and the provider's statements to them. This document has been checked and approved by the attending provider.

## 2016-11-22 ENCOUNTER — Ambulatory Visit
Admission: RE | Admit: 2016-11-22 | Discharge: 2016-11-22 | Disposition: A | Discharge status: Home / Self Care | Payer: Medicare Other | Source: Ambulatory Visit | Attending: Radiation Oncology | Admitting: Radiation Oncology

## 2016-11-22 DIAGNOSIS — Z510 Encounter for antineoplastic radiation therapy: Secondary | ICD-10-CM | POA: Diagnosis not present

## 2016-11-23 ENCOUNTER — Ambulatory Visit
Admission: RE | Admit: 2016-11-23 | Discharge: 2016-11-23 | Disposition: A | Discharge status: Home / Self Care | Payer: Medicare Other | Source: Ambulatory Visit | Attending: Radiation Oncology | Admitting: Radiation Oncology

## 2016-11-23 DIAGNOSIS — Z510 Encounter for antineoplastic radiation therapy: Secondary | ICD-10-CM | POA: Diagnosis not present

## 2016-11-24 ENCOUNTER — Ambulatory Visit (HOSPITAL_BASED_OUTPATIENT_CLINIC_OR_DEPARTMENT_OTHER): Payer: Medicare Other

## 2016-11-24 ENCOUNTER — Ambulatory Visit: Admission: RE | Admit: 2016-11-24 | Payer: Medicare Other | Source: Ambulatory Visit

## 2016-11-24 ENCOUNTER — Telehealth: Payer: Self-pay | Admitting: Medical Oncology

## 2016-11-24 ENCOUNTER — Telehealth: Payer: Self-pay | Admitting: Radiation Oncology

## 2016-11-24 ENCOUNTER — Ambulatory Visit (HOSPITAL_BASED_OUTPATIENT_CLINIC_OR_DEPARTMENT_OTHER): Payer: Medicare Other | Admitting: Medical

## 2016-11-24 ENCOUNTER — Other Ambulatory Visit: Payer: Self-pay | Admitting: Medical Oncology

## 2016-11-24 VITALS — BP 97/49 | HR 57 | Temp 97.8°F | Resp 18

## 2016-11-24 DIAGNOSIS — R251 Tremor, unspecified: Secondary | ICD-10-CM

## 2016-11-24 DIAGNOSIS — R112 Nausea with vomiting, unspecified: Secondary | ICD-10-CM

## 2016-11-24 DIAGNOSIS — R531 Weakness: Secondary | ICD-10-CM

## 2016-11-24 DIAGNOSIS — R197 Diarrhea, unspecified: Secondary | ICD-10-CM

## 2016-11-24 DIAGNOSIS — C3492 Malignant neoplasm of unspecified part of left bronchus or lung: Secondary | ICD-10-CM

## 2016-11-24 DIAGNOSIS — E860 Dehydration: Secondary | ICD-10-CM | POA: Diagnosis present

## 2016-11-24 DIAGNOSIS — H532 Diplopia: Secondary | ICD-10-CM

## 2016-11-24 DIAGNOSIS — R5383 Other fatigue: Secondary | ICD-10-CM

## 2016-11-24 DIAGNOSIS — R110 Nausea: Secondary | ICD-10-CM

## 2016-11-24 DIAGNOSIS — R5382 Chronic fatigue, unspecified: Secondary | ICD-10-CM

## 2016-11-24 DIAGNOSIS — R630 Anorexia: Secondary | ICD-10-CM

## 2016-11-24 DIAGNOSIS — R269 Unspecified abnormalities of gait and mobility: Secondary | ICD-10-CM

## 2016-11-24 DIAGNOSIS — C7931 Secondary malignant neoplasm of brain: Secondary | ICD-10-CM

## 2016-11-24 LAB — CBC WITH DIFFERENTIAL/PLATELET
BASO%: 0.9 % (ref 0.0–2.0)
BASOS ABS: 0.1 10*3/uL (ref 0.0–0.1)
EOS ABS: 0.5 10*3/uL (ref 0.0–0.5)
EOS%: 7.2 % — ABNORMAL HIGH (ref 0.0–7.0)
HEMATOCRIT: 38.3 % — AB (ref 38.4–49.9)
HGB: 12.6 g/dL — ABNORMAL LOW (ref 13.0–17.1)
LYMPH#: 1.1 10*3/uL (ref 0.9–3.3)
LYMPH%: 16.5 % (ref 14.0–49.0)
MCH: 30.6 pg (ref 27.2–33.4)
MCHC: 32.9 g/dL (ref 32.0–36.0)
MCV: 93 fL (ref 79.3–98.0)
MONO#: 0.6 10*3/uL (ref 0.1–0.9)
MONO%: 8.4 % (ref 0.0–14.0)
NEUT#: 4.7 10*3/uL (ref 1.5–6.5)
NEUT%: 67 % (ref 39.0–75.0)
PLATELETS: 186 10*3/uL (ref 140–400)
RBC: 4.12 10*6/uL — AB (ref 4.20–5.82)
RDW: 14.5 % (ref 11.0–14.6)
WBC: 6.9 10*3/uL (ref 4.0–10.3)

## 2016-11-24 LAB — COMPREHENSIVE METABOLIC PANEL
ALT: 22 U/L (ref 0–55)
ANION GAP: 9 meq/L (ref 3–11)
AST: 34 U/L (ref 5–34)
Albumin: 2.8 g/dL — ABNORMAL LOW (ref 3.5–5.0)
Alkaline Phosphatase: 102 U/L (ref 40–150)
BILIRUBIN TOTAL: 0.62 mg/dL (ref 0.20–1.20)
BUN: 16.9 mg/dL (ref 7.0–26.0)
CALCIUM: 9.1 mg/dL (ref 8.4–10.4)
CHLORIDE: 105 meq/L (ref 98–109)
CO2: 23 meq/L (ref 22–29)
Creatinine: 0.9 mg/dL (ref 0.7–1.3)
Glucose: 106 mg/dl (ref 70–140)
Potassium: 4.4 mEq/L (ref 3.5–5.1)
Sodium: 138 mEq/L (ref 136–145)
Total Protein: 6.4 g/dL (ref 6.4–8.3)

## 2016-11-24 LAB — TSH: TSH: 0.574 m[IU]/L (ref 0.320–4.118)

## 2016-11-24 MED ORDER — SODIUM CHLORIDE 0.9% FLUSH
10.0000 mL | Freq: Once | INTRAVENOUS | Status: AC
  Administered 2016-11-24: 10 mL via INTRAVENOUS
  Filled 2016-11-24: qty 10

## 2016-11-24 MED ORDER — PREDNISONE 1 MG PO TABS: ORAL_TABLET | ORAL | 0 refills | Status: DC

## 2016-11-24 MED ORDER — SODIUM CHLORIDE 0.9 % IV SOLN
INTRAVENOUS | Status: DC
  Administered 2016-11-24: 17:00:00 via INTRAVENOUS

## 2016-11-24 MED ORDER — HEPARIN SOD (PORK) LOCK FLUSH 100 UNIT/ML IV SOLN
500.0000 [IU] | Freq: Once | INTRAVENOUS | Status: AC
  Administered 2016-11-24: 500 [IU] via INTRAVENOUS
  Filled 2016-11-24: qty 5

## 2016-11-24 MED ORDER — ONDANSETRON HCL 4 MG/2ML IJ SOLN
8.0000 mg | Freq: Once | INTRAMUSCULAR | Status: AC
  Administered 2016-11-24: 8 mg via INTRAVENOUS

## 2016-11-24 MED ORDER — ONDANSETRON HCL 4 MG/2ML IJ SOLN
INTRAMUSCULAR | Status: AC
  Filled 2016-11-24: qty 4

## 2016-11-24 NOTE — Telephone Encounter (Signed)
Call from [place] , [place] . Per Ebony Hail his double vision is from xrt. [place] is down and they offered to r/s his xrt ,but wife cancelled. Can he see [facility] for "failure to thrive"? Per Julien Nordmann do labs and [facility] today .  I called wife and she said pt missed appt on Monday due to diarrhea.   I gave her appt today for lab and smc.

## 2016-11-24 NOTE — Telephone Encounter (Signed)
Phoned wife back. Explained the following per Ashlyn Bruning, PA-C: Diplopia is most likely related to mild inflammation from [facility] treatment. Decadron is used to treat this but because of the negative impact steroids have on his blood sugar they are not recommended. Reviewed power port care s/p insertion per note written by Corrie Mckusick, DO. Reinforced that the patient was OK to shower on or after 11/19/16 but, was not to submerge the area until after 11/25/16. Explained Ashlyn is uncertain why his left arm waves in a circle when he reaches for something but, doesn't believe this is a result/related to [facility] treatment. Explained his leg weakness and inability to stand/walk independently is multifactorial. Explained the effects of the tumor location coupled with the lack of his oral intake (source of energy) is most likely the cause of his weakness. Explained Ashlyn is uncertain why "everything taste bad" since he hasn't started chemotherapy and radiation doesn't cause taste changes. Explained that it doesn't appear the missed chemotherapy on Monday has been reschedule yet. Explained to the wife that this RN has discussed her concerns with Shauna Hugh, RN for Dr. Julien Nordmann. Expressed that Diane plans to talk with Dr. Julien Nordmann and call Ms. Fariss with next steps. Ms. Geurts verbalized understanding.

## 2016-11-24 NOTE — Patient Instructions (Addendum)
Imodium one after each loose bowel movement for up to 8 doses per day   Dehydration, Adult Dehydration is a condition in which there is not enough fluid or water in the body. This happens when you lose more fluids than you take in. Important organs, such as the kidneys, brain, and heart, cannot function without a proper amount of fluids. Any loss of fluids from the body can lead to dehydration. Dehydration can range from mild to severe. This condition should be treated right away to prevent it from becoming severe. What are the causes? This condition may be caused by:  Vomiting.  Diarrhea.  Excessive sweating, such as from heat exposure or exercise.  Not drinking enough fluid, especially: ? When ill. ? While doing activity that requires a lot of energy.  Excessive urination.  Fever.  Infection.  Certain medicines, such as medicines that cause the body to lose excess fluid (diuretics).  Inability to access safe drinking water.  Reduced physical ability to get adequate water and food.  What increases the risk? This condition is more likely to develop in people:  Who have a poorly controlled long-term (chronic) illness, such as diabetes, heart disease, or kidney disease.  Who are age 67 or older.  Who are disabled.  Who live in a place with high altitude.  Who play endurance sports.  What are the signs or symptoms? Symptoms of mild dehydration may include:  Thirst.  Dry lips.  Slightly dry mouth.  Dry, warm skin.  Dizziness. Symptoms of moderate dehydration may include:  Very dry mouth.  Muscle cramps.  Dark urine. Urine may be the color of tea.  Decreased urine production.  Decreased tear production.  Heartbeat that is irregular or faster than normal (palpitations).  Headache.  Light-headedness, especially when you stand up from a sitting position.  Fainting (syncope). Symptoms of severe dehydration may include:  Changes in skin, such  as: ? Cold and clammy skin. ? Blotchy (mottled) or pale skin. ? Skin that does not quickly return to normal after being lightly pinched and released (poor skin turgor).  Changes in body fluids, such as: ? Extreme thirst. ? No tear production. ? Inability to sweat when body temperature is high, such as in hot weather. ? Very little urine production.  Changes in vital signs, such as: ? Weak pulse. ? Pulse that is more than 100 beats a minute when sitting still. ? Rapid breathing. ? Low blood pressure.  Other changes, such as: ? Sunken eyes. ? Cold hands and feet. ? Confusion. ? Lack of energy (lethargy). ? Difficulty waking up from sleep. ? Short-term weight loss. ? Unconsciousness. How is this diagnosed? This condition is diagnosed based on your symptoms and a physical exam. Blood and urine tests may be done to help confirm the diagnosis. How is this treated? Treatment for this condition depends on the severity. Mild or moderate dehydration can often be treated at home. Treatment should be started right away. Do not wait until dehydration becomes severe. Severe dehydration is an emergency and it needs to be treated in a hospital. Treatment for mild dehydration may include:  Drinking more fluids.  Replacing salts and minerals in your blood (electrolytes) that you may have lost. Treatment for moderate dehydration may include:  Drinking an oral rehydration solution (ORS). This is a drink that helps you replace fluids and electrolytes (rehydrate). It can be found at pharmacies and retail stores. Treatment for severe dehydration may include:  Receiving fluids through an IV  tube.  Receiving an electrolyte solution through a feeding tube that is passed through your nose and into your stomach (nasogastric tube, or NG tube).  Correcting any abnormalities in electrolytes.  Treating the underlying cause of dehydration. Follow these instructions at home:  If directed by your health  care provider, drink an ORS: ? Make an ORS by following instructions on the package. ? Start by drinking small amounts, about  cup (120 mL) every 5-10 minutes. ? Slowly increase how much you drink until you have taken the amount recommended by your health care provider.  Drink enough clear fluid to keep your urine clear or pale yellow. If you were told to drink an ORS, finish the ORS first, then start slowly drinking other clear fluids. Drink fluids such as: ? Water. Do not drink only water. Doing that can lead to having too little salt (sodium) in the body (hyponatremia). ? Ice chips. ? Fruit juice that you have added water to (diluted fruit juice). ? Low-calorie sports drinks.  Avoid: ? Alcohol. ? Drinks that contain a lot of sugar. These include high-calorie sports drinks, fruit juice that is not diluted, and soda. ? Caffeine. ? Foods that are greasy or contain a lot of fat or sugar.  Take over-the-counter and prescription medicines only as told by your health care provider.  Do not take sodium tablets. This can lead to having too much sodium in the body (hypernatremia).  Eat foods that contain a healthy balance of electrolytes, such as bananas, oranges, potatoes, tomatoes, and spinach.  Keep all follow-up visits as told by your health care provider. This is important. Contact a health care provider if:  You have abdominal pain that: ? Gets worse. ? Stays in one area (localizes).  You have a rash.  You have a stiff neck.  You are more irritable than usual.  You are sleepier or more difficult to wake up than usual.  You feel weak or dizzy.  You feel very thirsty.  You have urinated only a small amount of very dark urine over 6-8 hours. Get help right away if:  You have symptoms of severe dehydration.  You cannot drink fluids without vomiting.  Your symptoms get worse with treatment.  You have a fever.  You have a severe headache.  You have vomiting or  diarrhea that: ? Gets worse. ? Does not go away.  You have blood or green matter (bile) in your vomit.  You have blood in your stool. This may cause stool to look black and tarry.  You have not urinated in 6-8 hours.  You faint.  Your heart rate while sitting still is over 100 beats a minute.  You have trouble breathing. This information is not intended to replace advice given to you by your health care provider. Make sure you discuss any questions you have with your health care provider. Document Released: 01/24/2005 Document Revised: 08/21/2015 Document Reviewed: 03/20/2015 Elsevier Interactive Patient Education  [employer].

## 2016-11-24 NOTE — Telephone Encounter (Signed)
"  Double vision , not eating . Left arm waves in a circle when he raises it".XRT today at 1100-call transferred to [facility] and cc note.

## 2016-11-24 NOTE — Telephone Encounter (Signed)
Received voicemail message from patient's wife requesting a return call. Phoned her back. She request to see Dr. Tammi Klippel today to discuss "several questions they have." Explained Dr. Tammi Klippel isn't in the office today and inquire about questions that need answering. She questions why her husband has had diplopia since [facility] treatment. She questions if the dressing from last Thursday when her husband's port was placed can be changed. She questions why his left arm waves in a circle every time he reaches for something. She reports he doesn't want to eat or drink due to "as bad taste in his mouth" and want someone to prescribe him something other than decadron. She questions when his chemotherapy that was cancelled Monday due to diarrhea is rescheduled. She questions why his legs are weak and he is unable to stand or walk independently. She understands this RN will discuss these questions with Steve Caldron, PA-C and Diane, RN for Dr. Julien Nordmann then, phone her back.

## 2016-11-24 NOTE — Progress Notes (Signed)
Tolerated IV fluids well as well as po fluids.  Sandi Mealy, PA made aware of post IV fluid VS. Pt discharged home in the care of his wife. F/u appts with Dr. Julien Nordmann to be made per scheduling

## 2016-11-25 ENCOUNTER — Telehealth: Payer: Self-pay | Admitting: Radiation Oncology

## 2016-11-25 ENCOUNTER — Ambulatory Visit: Payer: Medicare Other

## 2016-11-25 NOTE — Telephone Encounter (Signed)
Patient did not present for radiation therapy. Phoned wife, Steve Frank, to inquire. She states, "Dr. Julien Nordmann told us not to come back for a week." Discussed findings with Dr. Tammi Klippel. Per Dr. Tammi Klippel encouraged lung radiation resume on Monday as previously scheduled. Wife confirms they will be present. Provided wife with contact information for [facility] desk nurse to obtain updated chemo schedule.

## 2016-11-25 NOTE — Progress Notes (Signed)
[facility] Progress Note   Steve Frank [id number] Jul 02, 1931 83 y.o.  Steve Frank is managed by Dr. Eilleen Kempf  Actively treated with chemotherapy: no  Assessment: Plan:    Nausea without vomiting - Plan: ondansetron (ZOFRAN) injection 8 mg  Dehydration - Plan: 0.9 %  sodium chloride infusion, sodium chloride flush (NS) 0.9 % injection 10 mL, heparin lock flush 100 unit/mL  Anorexia - Plan: predniSONE (DELTASONE) 1 MG tablet  Diplopia - Plan: predniSONE (DELTASONE) 1 MG tablet   Nausea without vomiting: The patient is dosed with Zofran 8 mg IV today.  Anorexia and diplopia: patient was given a prescription for prednisone 2 mg by mouth twice a day 7 days followed by 2 mg once daily 7 days.  Dehydration: Patient was given 1 L of IV normal saline today.  Stage IV (T1b, N3, M1c) non-small cell lung cancer, squamous cell carcinoma: Dr. Julien Nordmann has discussed delaying his chemotherapy by one week.  Please see After Visit Summary for patient specific instructions.  Future Appointments Date Time Provider [place]  11/28/2016 11:25 AM [facility] LINAC 1 [facility] None  11/29/2016 11:00 AM [facility] LINAC 1 [facility] None  11/30/2016 11:00 AM [facility] LINAC 1 [facility] None  12/01/2016 11:00 AM [facility] LINAC 1 [facility] None  12/02/2016 11:00 AM [facility] LINAC 1 [facility] None  12/05/2016 10:45 AM [facility] LINAC 1 [facility] None  12/06/2016 10:45 AM [facility] LINAC 1 [facility] None  12/07/2016 10:45 AM [facility] LINAC 1 [facility] None  12/08/2016 10:45 AM [facility] LINAC 1 [facility] None  12/09/2016 10:45 AM [facility] LINAC 1 [facility] None  12/12/2016 10:00 AM [facility] LAB 1 [facility] None  12/12/2016 10:30 AM [facility] FLUSH NURSE [facility] None  12/12/2016 10:45 AM [facility] LINAC 1 [facility] None  12/12/2016 11:45 AM [facility] A3 [facility] None  12/13/2016 10:45 AM [facility] LINAC  1 [facility] None  12/14/2016 10:45 AM [facility] LINAC 1 [facility] None  12/15/2016 10:45 AM [facility] LINAC 1 [facility] None  12/16/2016 10:45 AM [facility] LINAC 1 [facility] None  12/19/2016 10:45 AM [facility] LINAC 1 [facility] None  12/20/2016 10:45 AM [facility] LINAC 1 [facility] None  12/21/2016 10:45 AM [facility] LINAC 1 [facility] None  12/22/2016 10:45 AM [facility] LINAC 1 [facility] None  12/23/2016 10:45 AM [facility] LINAC 1 [facility] None  12/26/2016 10:45 AM [facility] LINAC 1 [facility] None  12/27/2016 10:45 AM [facility] LINAC 1 [facility] None  109/25/202018 10:45 AM [facility] LINAC 1 [facility] None  01/02/2017 9:30 AM [facility] LINAC 1 [facility] None  01/02/2017 10:00 AM [facility] LAB ONLY [facility] None  01/02/2017 10:30 AM [facility] FLUSH NURSE [facility] None  01/02/2017 11:30 AM [facility] B5 [facility] None  01/03/2017 10:45 AM [facility] LINAC 1 [facility] None  01/04/2017 10:45 AM [facility] LINAC 1 [facility] None  01/05/2017 10:45 AM [facility] LINAC 1 [facility] None  01/06/2017 10:45 AM [facility] LINAC 1 [facility] None  01/09/2017 10:45 AM [facility] LINAC 1 [facility] None  01/10/2017 10:45 AM [facility] LINAC 1 [facility] None  01/23/2017 10:00 AM [facility] LAB 4 [facility] None  01/23/2017 10:30 AM [facility] FLUSH NURSE [facility] None  01/23/2017 11:30 AM [facility] A3 [facility] None  01/24/2017 1:00 PM Bruning, Ashlyn, PA-C [facility] None  02/16/2017 11:00 AM [facility] MR 3 GI-315MRI GI[facility] [address]    No orders of the defined types were placed in this encounter.      Subjective:   Patient ID:  Steve Frank is a 83 y.o. (DOB 10/15/1931) male.  Chief Complaint:  Chief Complaint  Patient presents with  .  Fatigue    HPI Steve Frank is an 83 year old male with a history of a stage IV (T1b, N3, M1c) non-small cell lung cancer, squamous cell carcinoma diagnosed in September 2018 who  presented with a left infrahilar mass in addition to mediastinal, supraclavicular lymphadenopathy and a metastatic brain lesion. The patient had 1 fraction of whole brain radiation and has completed 3 fractions of radiation therapy to his chest/far. He presents to the office today with a report of double vision, difficulty walking, instability, involuntary movements of the left hand, diarrhea after using boost, and nausea. His wife reports that he is eating little but does drink around three quarters of a cup of broth, 1-1/2 glasses of buttermilk, and a small glass margins juice daily.  Medications: I have reviewed the patient's current medications.  Allergies:  Allergies  Allergen Reactions  . Ciprofloxacin Nausea And Vomiting    Past Medical History:  Diagnosis Date  . Arthritis   . BPH (benign prostatic hyperplasia)   . CAD (coronary artery disease)    s/p 2 cabg's  . Cancer (HCC)    Basal and squamous cell  skin cancer  . Cerebellar mass   . Chest mass   . Diabetes ([place])    Type II  . GERD (gastroesophageal reflux disease)   . Head injury with loss of consciousness [facility])    age 53 or 14-   . Heart attack ([place])   . HLD (hyperlipidemia)   . Hypertension   . Macular degeneration   . Pneumonia     x 2 last 1071  . Stage IV squamous cell carcinoma of left lung ([place]) 10/20/2016    Past Surgical History:  Procedure Laterality Date  . CARDIAC CATHETERIZATION    . COLONOSCOPY W/ POLYPECTOMY    . CORONARY ARTERY BYPASS GRAFT     Dr Prescott Gum [facility] 07/2007  . CORONARY ARTERY BYPASS GRAFT     1st one was @ [facility] 26years ago 12/1991  . CRANIECTOMY FOR EXCISION OF BRAIN TUMOR INFRATENTORIAL / POSTERIOR FOSSA  08/05/2016   Dr Rebecka Apley  @ [facility]/Neurosurgery  . FLEXIBLE BRONCHOSCOPY  08/07/2016   Dr Gwenevere Ghazi  . IR FLUORO GUIDE PORT INSERTION RIGHT  11/18/2016  . IR US GUIDE VASC ACCESS RIGHT  11/18/2016  . LUNG BIOPSY N/A 10/14/2016   Procedure: LUNG BIOPSY;  Surgeon:  Grace Isaac, MD;  Location: [place];  Service: Thoracic;  Laterality: N/A;  . VIDEO BRONCHOSCOPY WITH ENDOBRONCHIAL ULTRASOUND N/A 10/14/2016   Procedure: VIDEO BRONCHOSCOPY WITH ENDOBRONCHIAL ULTRASOUND;  Surgeon: Grace Isaac, MD;  Location: [facility] OR;  Service: Thoracic;  Laterality: N/A;    Family History  Problem Relation Age of Onset  . Cancer Brother        lung to head and neck involving lymph nodes  . Cancer Paternal Aunt        breast  . Cancer Paternal Uncle        leukemia  . Cancer Paternal Aunt        breast  . Cancer Paternal Uncle        lung    Social History   Social History  . Marital status: Married    Spouse name: N/A  . Number of children: 4  . Years of education: N/A   Occupational History  . retired    Social History Main Topics  . Smoking status: Former Smoker    Packs/day: 2.00    Years: 35.00  Types: Cigarettes    Quit date: 02/07/1985  . Smokeless tobacco: Former Systems developer     Comment: 10/13/16- Quit 31 years ago  . Alcohol use No  . Drug use: No  . Sexual activity: Not Currently   Other Topics Concern  . Not on file   Social History Narrative  . No narrative on file    Past Medical History, Surgical history, Social history, and Family history were reviewed and updated as appropriate.   Please see review of systems for further details on the patient's review from today.   Review of Systems:  Review of Systems  Constitutional: Positive for activity change, appetite change and fatigue. Negative for chills, diaphoresis and fever.  Eyes: Positive for visual disturbance (diplopia).  Respiratory: Negative for cough, chest tightness and shortness of breath.   Cardiovascular: Positive for leg swelling (stable long-standing bilateral arcs and edema). Negative for chest pain and palpitations.  Gastrointestinal: Positive for diarrhea and nausea. Negative for constipation and vomiting.  Neurological: Positive for dizziness and weakness.     Objective:   Physical Exam:  BP (!) 97/49 (BP Location: Right Arm, Patient Position: Standing)   Pulse (!) 57   Temp 97.8 F (36.6 C) (Oral)   Resp 18   SpO2 98%  ECOG: 2 Orthostatic Blood Pressure: Blood pressure:   sitting 134/48, standing 91/48 Pulse:   sitting 45, standing 52    Physical Exam  Constitutional: No distress.  HENT:  Head: Normocephalic and atraumatic.  Mouth/Throat: Oropharynx is clear and moist. No oropharyngeal exudate.  Cardiovascular: Normal rate, regular rhythm and normal heart sounds.  Exam reveals no gallop and no friction rub.   No murmur heard. Pulmonary/Chest: Effort normal and breath sounds normal. No respiratory distress. He has no wheezes. He has no rales.  Abdominal: Soft. Bowel sounds are normal. He exhibits no distension. There is no tenderness. There is no rebound and no guarding.  Musculoskeletal: He exhibits edema (bilateral lower extremity edema to the knees.).  Skin: Skin is warm and dry. He is not diaphoretic.    Lab Review:     Component Value Date/Time   NA 138 11/24/2016 1345   K 4.4 11/24/2016 1345   CL 105 10/14/2016 0623   CO2 23 11/24/2016 1345   GLUCOSE 106 11/24/2016 1345   BUN 16.9 11/24/2016 1345   CREATININE 0.9 11/24/2016 1345   CALCIUM 9.1 11/24/2016 1345   PROT 6.4 11/24/2016 1345   ALBUMIN 2.8 (L) 11/24/2016 1345   AST 34 11/24/2016 1345   ALT 22 11/24/2016 1345   ALKPHOS 102 11/24/2016 1345   BILITOT 0.62 11/24/2016 1345   GFRNONAA >60 10/14/2016 0623   GFRAA >60 10/14/2016 0623       Component Value Date/Time   WBC 6.9 11/24/2016 1345   WBC 8.1 11/17/2016 1306   RBC 4.12 (L) 11/24/2016 1345   RBC 4.42 11/17/2016 1306   HGB 12.6 (L) 11/24/2016 1345   HCT 38.3 (L) 11/24/2016 1345   PLT 186 11/24/2016 1345   MCV 93.0 11/24/2016 1345   MCH 30.6 11/24/2016 1345   MCH 31.2 11/17/2016 1306   MCHC 32.9 11/24/2016 1345   MCHC 33.7 11/17/2016 1306   RDW 14.5 11/24/2016 1345   LYMPHSABS 1.1  11/24/2016 1345   MONOABS 0.6 11/24/2016 1345   EOSABS 0.5 11/24/2016 1345   BASOSABS 0.1 11/24/2016 1345   -------------------------------  Imaging from last 24 hours (if applicable):  Radiology interpretation: Mr Jeri Cos HY Contrast  Result Date: 11/02/2016 CLINICAL  DATA:  83 year old male originally diagnosed with left cerebellar mass in June, status post attempted resection but no definitive diagnosis was made. Recent PET-CT and bronchoscopic findings suggest stage IV non-small cell lung cancer, favoring squamous cell carcinoma. Restaging and treatment planning. Creatinine was obtained on site at [place] at [address] Results: Creatinine 0.9 mg/dL. EXAM: MRI HEAD WITHOUT AND WITH CONTRAST TECHNIQUE: Multiplanar, multiecho pulse sequences of the brain and surrounding structures were obtained without and with intravenous contrast. CONTRAST:  32m MULTIHANCE GADOBENATE DIMEGLUMINE 529 MG/ML IV SOLN COMPARISON:  PET[facility]CT 09/20/2016. [facility] FINDINGS: Brain: A 37 mm diameter rounded, intensely and at in the left ventral superior cerebellar sphere has increased since June, approximately 24 mm at that time. See series 10, image 49 today. There is mildly increased regional edema tracking into the dorsal left brainstem, and regional mass effect including on the dorsal left pons, the left cerebellar peduncle, and the left lateral aspect of the fourth ventricle (series 2, image 24). Basilar cisterns remain patent. No ventriculomegaly. Trace extra-axial hemorrhage along the undersurface of the left tentorium associated with smooth dural thickening. No other intracranial metastatic disease identified. Left occipital craniotomy site with more irregular appearing dural thickening. No restricted diffusion or evidence of acute infarction. No midline shift. Scattered and patchy nonspecific bilateral cerebral white matter T2 and  FLAIR hyperintensity appears stable since June. No acute intracranial hemorrhage. Negative pituitary and cervicomedullary junction. Vascular: Major intracranial vascular flow voids are preserved. The major dural venous sinuses are enhancing and appear to remain patent. Skull and upper cervical spine: Negative visualized cervical spine and spinal cord except for degenerative changes. Visible bone marrow signal is within normal limits. Sinuses/Orbits: Normal orbits soft tissues. Visualized paranasal sinuses and mastoids are stable and well pneumatized. Other: Visible internal auditory structures appear normal. Left suboccipital scalp postoperative soft tissue changes. No acute scalp soft tissue findings. IMPRESSION: 1. Solitary left anterior superior cerebellar mass has increased since June to now 37 mm diameter. Mildly progressed regional mass effect and edema, including involvement of the left dorsal pons and left cerebellar peduncles. 2. No new brain mass/metastasis or new intracranial abnormality identified. 3. Sequelae of left suboccipital craniotomy. Electronically Signed   By: HGenevie AnnM.D.   On: 11/02/2016 15:10   Ir [place] Vasc Access Right  Result Date: 11/18/2016 INDICATION: 83year old male with a history of non-small cell carcinoma EXAM: IMPLANTED PORT A CATH PLACEMENT WITH ULTRASOUND AND FLUOROSCOPIC GUIDANCE MEDICATIONS: 2.0 g Ancef; The antibiotic was administered within an appropriate time interval prior to skin puncture. ANESTHESIA/SEDATION: Moderate (conscious) sedation was employed during this procedure. A total of Versed 1.0 mg and Fentanyl 50 mcg was administered intravenously. Moderate Sedation Time: 20 minutes. The patient's level of consciousness and vital signs were monitored continuously by radiology nursing throughout the procedure under my direct supervision. FLUOROSCOPY TIME:  Zero minutes, 18 seconds COMPLICATIONS: None PROCEDURE: The procedure, risks, benefits, and alternatives  were explained to the patient. Questions regarding the procedure were encouraged and answered. The patient understands and consents to the procedure. Ultrasound survey was performed with images stored and sent to PACs. The right neck and chest was prepped with chlorhexidine, and draped in the usual sterile fashion using maximum barrier technique (cap and mask, sterile gown, sterile gloves, large sterile sheet, hand hygiene and cutaneous antiseptic). Antibiotic prophylaxis was provided with 2.0g Ancef administered IV one hour prior to skin incision. Local anesthesia was attained by  infiltration with 1% lidocaine without epinephrine. Ultrasound demonstrated patency of the right internal jugular vein, and this was documented with an image. Under real-time ultrasound guidance, this vein was accessed with a 21 gauge micropuncture needle and image documentation was performed. A small dermatotomy was made at the access site with an 11 scalpel. A 0.018" wire was advanced into the SVC and used to estimate the length of the internal catheter. The access needle exchanged for a 42F micropuncture vascular sheath. The 0.018" wire was then removed and a 0.035" wire advanced into the IVC. An appropriate location for the subcutaneous reservoir was selected below the clavicle and an incision was made through the skin and underlying soft tissues. The subcutaneous tissues were then dissected using a combination of blunt and sharp surgical technique and a pocket was formed. A single lumen power injectable portacatheter was then tunneled through the subcutaneous tissues from the pocket to the dermatotomy and the port reservoir placed within the subcutaneous pocket. The venous access site was then serially dilated and a peel away vascular sheath placed over the wire. The wire was removed and the port catheter advanced into position under fluoroscopic guidance. The catheter tip is positioned in the cavoatrial junction. This was documented  with a spot image. The portacatheter was then tested and found to flush and aspirate well. The port was flushed with saline followed by 100 units/mL heparinized saline. The pocket was then closed in two layers using first subdermal inverted interrupted absorbable sutures followed by a running subcuticular suture. The epidermis was then sealed with Dermabond. The dermatotomy at the venous access site was also seal with Dermabond. Patient tolerated the procedure well and remained hemodynamically stable throughout. No complications encountered and no significant blood loss encountered IMPRESSION: Status post right IJ port catheter placement. Catheter ready for use. Signed, Dulcy Fanny. Earleen Newport, DO [facility] Radiology Electronically Signed   By: Corrie Mckusick D.O.   On: 11/18/2016 16:31   Ir Fluoro Guide Port Insertion Right  Result Date: 11/18/2016 INDICATION: 83 year old male with a history of non-small cell carcinoma EXAM: IMPLANTED PORT A CATH PLACEMENT WITH ULTRASOUND AND FLUOROSCOPIC GUIDANCE MEDICATIONS: 2.0 g Ancef; The antibiotic was administered within an appropriate time interval prior to skin puncture. ANESTHESIA/SEDATION: Moderate (conscious) sedation was employed during this procedure. A total of Versed 1.0 mg and Fentanyl 50 mcg was administered intravenously. Moderate Sedation Time: 20 minutes. The patient's level of consciousness and vital signs were monitored continuously by radiology nursing throughout the procedure under my direct supervision. FLUOROSCOPY TIME:  Zero minutes, 18 seconds COMPLICATIONS: None PROCEDURE: The procedure, risks, benefits, and alternatives were explained to the patient. Questions regarding the procedure were encouraged and answered. The patient understands and consents to the procedure. Ultrasound survey was performed with images stored and sent to PACs. The right neck and chest was prepped with chlorhexidine, and draped in  the usual sterile fashion using maximum barrier technique (cap and mask, sterile gown, sterile gloves, large sterile sheet, hand hygiene and cutaneous antiseptic). Antibiotic prophylaxis was provided with 2.0g Ancef administered IV one hour prior to skin incision. Local anesthesia was attained by infiltration with 1% lidocaine without epinephrine. Ultrasound demonstrated patency of the right internal jugular vein, and this was documented with an image. Under real-time ultrasound guidance, this vein was accessed with a 21 gauge micropuncture needle and image documentation was performed. A small dermatotomy was made at the access site with an 11 scalpel. A 0.018" wire was advanced into  the SVC and used to estimate the length of the internal catheter. The access needle exchanged for a 48F micropuncture vascular sheath. The 0.018" wire was then removed and a 0.035" wire advanced into the IVC. An appropriate location for the subcutaneous reservoir was selected below the clavicle and an incision was made through the skin and underlying soft tissues. The subcutaneous tissues were then dissected using a combination of blunt and sharp surgical technique and a pocket was formed. A single lumen power injectable portacatheter was then tunneled through the subcutaneous tissues from the pocket to the dermatotomy and the port reservoir placed within the subcutaneous pocket. The venous access site was then serially dilated and a peel away vascular sheath placed over the wire. The wire was removed and the port catheter advanced into position under fluoroscopic guidance. The catheter tip is positioned in the cavoatrial junction. This was documented with a spot image. The portacatheter was then tested and found to flush and aspirate well. The port was flushed with saline followed by 100 units/mL heparinized saline. The pocket was then closed in two layers using first subdermal inverted interrupted absorbable sutures followed by a  running subcuticular suture. The epidermis was then sealed with Dermabond. The dermatotomy at the venous access site was also seal with Dermabond. Patient tolerated the procedure well and remained hemodynamically stable throughout. No complications encountered and no significant blood loss encountered IMPRESSION: Status post right IJ port catheter placement. Catheter ready for use. Signed, Dulcy Fanny. Earleen Newport, DO [facility] Radiology Electronically Signed   By: Corrie Mckusick D.O.   On: 11/18/2016 16:31        This patient was seen with Dr. Julien Nordmann with my treatment plan reviewed with him. He expressed agreement with my medical management of this patient.  ADDENDUM: Hematology/Oncology Attending: I had a face-to-face encounter with the patient. I recommended his care plan. This is a very pleasant 83 years old white male with recently diagnosed metastatic non-small cell lung cancer, squamous cell carcinoma. The patient underwent stereotactic radiotherapy to solitary brain metastasis and he is currently undergoing palliative radiotherapy to the left infrahilar mass. He presented to the clinic today complaining of increasing fatigue and weakness as well as instability of his gait and tremor of his left hand. He also had several episodes of nausea and vomiting as well as diarrhea. He was supposed to start systemic chemotherapy with carboplatin, paclitaxel and Ketruda (pembrolizumab) earlier this week but this is currently on hold because of his radiotherapy. We will arrange for the patient to receive IV hydration with Zofran for the nausea today. I also encouraged the patient to continue his treatment with a tapered dose of Decadron. He will start Decadron 2 mg by mouth twice a day for 1 week followed by 2 mg by mouth daily the following week. We will delay the start of his chemotherapy until next week. For the diarrhea, he will continue on Imodium on as-needed  basis. I will arrange for the patient to come back for follow-up visit in 2 weeks for reevaluation and management any adverse effect of his treatment. He was advised to call immediately if he has any concerning symptoms in the interval.  Disclaimer: This note was dictated with voice recognition software. Similar sounding words can inadvertently be transcribed and may be missed upon review. Eilleen Kempf, MD 11/26/16

## 2016-11-26 ENCOUNTER — Encounter: Payer: Self-pay | Admitting: Medical

## 2016-11-28 ENCOUNTER — Ambulatory Visit
Admission: RE | Admit: 2016-11-28 | Discharge: 2016-11-28 | Disposition: A | Discharge status: Home / Self Care | Payer: Medicare Other | Source: Ambulatory Visit | Attending: Radiation Oncology | Admitting: Radiation Oncology

## 2016-11-28 DIAGNOSIS — Z510 Encounter for antineoplastic radiation therapy: Secondary | ICD-10-CM | POA: Diagnosis not present

## 2016-11-28 NOTE — Telephone Encounter (Signed)
Noted. Will see the patient in the Symptom Management Clinic as scheduled.

## 2016-11-29 ENCOUNTER — Ambulatory Visit
Admission: RE | Admit: 2016-11-29 | Discharge: 2016-11-29 | Disposition: A | Discharge status: Home / Self Care | Payer: Medicare Other | Source: Ambulatory Visit | Attending: Radiation Oncology | Admitting: Radiation Oncology

## 2016-11-29 ENCOUNTER — Encounter: Payer: Self-pay | Admitting: *Deleted

## 2016-11-29 ENCOUNTER — Telehealth: Payer: Self-pay

## 2016-11-29 DIAGNOSIS — Z510 Encounter for antineoplastic radiation therapy: Secondary | ICD-10-CM | POA: Diagnosis not present

## 2016-11-29 NOTE — Telephone Encounter (Signed)
Spoke with patient concerning upcoming appointment in Nov. Per MD. Requested date. Per 10/23 sch message.

## 2016-11-29 NOTE — Progress Notes (Signed)
Oncology Nurse Navigator Documentation  Oncology Nurse Navigator Flowsheets 11/29/2016  Navigator Location [facility]  Navigator Encounter Type Other/I followed up on Mr. Macphail's schedule.  He needs a follow up appt with Dr. Julien Nordmann on 12/08/16.  I notified scheduling to call and schedule patient to be seen with Dr. Julien Nordmann or Mikey Bussing   Treatment Phase Treatment  Barriers/Navigation Needs Coordination of Care  Interventions Coordination of Care  Coordination of Care Other  Acuity Level 2  Acuity Level 2 Other  Time Spent with Patient 30

## 2016-11-30 ENCOUNTER — Ambulatory Visit
Admission: RE | Admit: 2016-11-30 | Discharge: 2016-11-30 | Disposition: A | Discharge status: Home / Self Care | Payer: Medicare Other | Source: Ambulatory Visit | Attending: Radiation Oncology | Admitting: Radiation Oncology

## 2016-11-30 DIAGNOSIS — Z510 Encounter for antineoplastic radiation therapy: Secondary | ICD-10-CM | POA: Diagnosis not present

## 2016-12-01 ENCOUNTER — Ambulatory Visit
Admission: RE | Admit: 2016-12-01 | Discharge: 2016-12-01 | Disposition: A | Discharge status: Home / Self Care | Payer: Medicare Other | Source: Ambulatory Visit | Attending: Radiation Oncology | Admitting: Radiation Oncology

## 2016-12-01 DIAGNOSIS — Z510 Encounter for antineoplastic radiation therapy: Secondary | ICD-10-CM | POA: Diagnosis not present

## 2016-12-02 ENCOUNTER — Ambulatory Visit
Admission: RE | Admit: 2016-12-02 | Discharge: 2016-12-02 | Disposition: A | Discharge status: Home / Self Care | Payer: Medicare Other | Source: Ambulatory Visit | Attending: Radiation Oncology | Admitting: Radiation Oncology

## 2016-12-02 DIAGNOSIS — Z510 Encounter for antineoplastic radiation therapy: Secondary | ICD-10-CM | POA: Diagnosis not present

## 2016-12-05 ENCOUNTER — Ambulatory Visit
Admission: RE | Admit: 2016-12-05 | Discharge: 2016-12-05 | Disposition: A | Discharge status: Home / Self Care | Payer: Medicare Other | Source: Ambulatory Visit | Attending: Radiation Oncology | Admitting: Radiation Oncology

## 2016-12-05 DIAGNOSIS — Z510 Encounter for antineoplastic radiation therapy: Secondary | ICD-10-CM | POA: Diagnosis not present

## 2016-12-06 ENCOUNTER — Ambulatory Visit
Admission: RE | Admit: 2016-12-06 | Discharge: 2016-12-06 | Disposition: A | Discharge status: Home / Self Care | Payer: Medicare Other | Source: Ambulatory Visit | Attending: Radiation Oncology | Admitting: Radiation Oncology

## 2016-12-06 DIAGNOSIS — Z510 Encounter for antineoplastic radiation therapy: Secondary | ICD-10-CM | POA: Diagnosis not present

## 2016-12-07 ENCOUNTER — Ambulatory Visit
Admission: RE | Admit: 2016-12-07 | Discharge: 2016-12-07 | Disposition: A | Discharge status: Home / Self Care | Payer: Medicare Other | Source: Ambulatory Visit | Attending: Radiation Oncology | Admitting: Radiation Oncology

## 2016-12-07 DIAGNOSIS — Z510 Encounter for antineoplastic radiation therapy: Secondary | ICD-10-CM | POA: Diagnosis not present

## 2016-12-08 ENCOUNTER — Ambulatory Visit
Admission: RE | Admit: 2016-12-08 | Discharge: 2016-12-08 | Disposition: A | Discharge status: Home / Self Care | Payer: Medicare Other | Source: Ambulatory Visit | Attending: Radiation Oncology | Admitting: Radiation Oncology

## 2016-12-08 DIAGNOSIS — Z510 Encounter for antineoplastic radiation therapy: Secondary | ICD-10-CM | POA: Diagnosis not present

## 2016-12-09 ENCOUNTER — Ambulatory Visit
Admission: RE | Admit: 2016-12-09 | Discharge: 2016-12-09 | Disposition: A | Discharge status: Home / Self Care | Payer: Medicare Other | Source: Ambulatory Visit | Attending: Radiation Oncology | Admitting: Radiation Oncology

## 2016-12-09 DIAGNOSIS — Z510 Encounter for antineoplastic radiation therapy: Secondary | ICD-10-CM | POA: Diagnosis not present

## 2016-12-12 ENCOUNTER — Ambulatory Visit (HOSPITAL_BASED_OUTPATIENT_CLINIC_OR_DEPARTMENT_OTHER): Payer: Medicare Other | Admitting: Oncology

## 2016-12-12 ENCOUNTER — Ambulatory Visit
Admission: RE | Admit: 2016-12-12 | Discharge: 2016-12-12 | Disposition: A | Discharge status: Home / Self Care | Payer: Medicare Other | Source: Ambulatory Visit | Attending: Radiation Oncology | Admitting: Radiation Oncology

## 2016-12-12 ENCOUNTER — Ambulatory Visit (HOSPITAL_BASED_OUTPATIENT_CLINIC_OR_DEPARTMENT_OTHER): Payer: Medicare Other

## 2016-12-12 ENCOUNTER — Ambulatory Visit: Payer: Medicare Other

## 2016-12-12 ENCOUNTER — Other Ambulatory Visit (HOSPITAL_BASED_OUTPATIENT_CLINIC_OR_DEPARTMENT_OTHER): Payer: Medicare Other

## 2016-12-12 VITALS — BP 131/76 | HR 62 | Temp 97.0°F | Resp 18

## 2016-12-12 DIAGNOSIS — Z95828 Presence of other vascular implants and grafts: Secondary | ICD-10-CM | POA: Insufficient documentation

## 2016-12-12 DIAGNOSIS — C3492 Malignant neoplasm of unspecified part of left bronchus or lung: Secondary | ICD-10-CM

## 2016-12-12 DIAGNOSIS — Z510 Encounter for antineoplastic radiation therapy: Secondary | ICD-10-CM | POA: Diagnosis not present

## 2016-12-12 DIAGNOSIS — Z5111 Encounter for antineoplastic chemotherapy: Secondary | ICD-10-CM | POA: Diagnosis present

## 2016-12-12 DIAGNOSIS — R5382 Chronic fatigue, unspecified: Secondary | ICD-10-CM

## 2016-12-12 DIAGNOSIS — C7931 Secondary malignant neoplasm of brain: Secondary | ICD-10-CM

## 2016-12-12 DIAGNOSIS — E119 Type 2 diabetes mellitus without complications: Secondary | ICD-10-CM | POA: Diagnosis not present

## 2016-12-12 DIAGNOSIS — I10 Essential (primary) hypertension: Secondary | ICD-10-CM

## 2016-12-12 LAB — COMPREHENSIVE METABOLIC PANEL
ALBUMIN: 2.7 g/dL — AB (ref 3.5–5.0)
ALT: 19 U/L (ref 0–55)
AST: 25 U/L (ref 5–34)
Alkaline Phosphatase: 77 U/L (ref 40–150)
Anion Gap: 7 mEq/L (ref 3–11)
BILIRUBIN TOTAL: 0.46 mg/dL (ref 0.20–1.20)
BUN: 20.6 mg/dL (ref 7.0–26.0)
CO2: 24 mEq/L (ref 22–29)
Calcium: 8.9 mg/dL (ref 8.4–10.4)
Chloride: 107 mEq/L (ref 98–109)
Creatinine: 0.9 mg/dL (ref 0.7–1.3)
EGFR: >60 mL/min/{1.73_m2} (ref >60)
GLUCOSE: 148 mg/dL — AB (ref 70–140)
POTASSIUM: 4.4 meq/L (ref 3.5–5.1)
SODIUM: 139 meq/L (ref 136–145)
TOTAL PROTEIN: 6 g/dL — AB (ref 6.4–8.3)

## 2016-12-12 LAB — CBC WITH DIFFERENTIAL/PLATELET
BASO%: 0.8 % (ref 0.0–2.0)
Basophils Absolute: 0 10*3/uL (ref 0.0–0.1)
EOS ABS: 0.4 10*3/uL (ref 0.0–0.5)
EOS%: 7.4 % — ABNORMAL HIGH (ref 0.0–7.0)
HEMATOCRIT: 36.7 % — AB (ref 38.4–49.9)
HEMOGLOBIN: 12.1 g/dL — AB (ref 13.0–17.1)
LYMPH#: 0.4 10*3/uL — AB (ref 0.9–3.3)
LYMPH%: 7.4 % — ABNORMAL LOW (ref 14.0–49.0)
MCH: 30.8 pg (ref 27.2–33.4)
MCHC: 33 g/dL (ref 32.0–36.0)
MCV: 93.2 fL (ref 79.3–98.0)
MONO#: 0.5 10*3/uL (ref 0.1–0.9)
MONO%: 8.2 % (ref 0.0–14.0)
NEUT%: 76.2 % — ABNORMAL HIGH (ref 39.0–75.0)
NEUTROS ABS: 4.6 10*3/uL (ref 1.5–6.5)
Platelets: 123 10*3/uL — ABNORMAL LOW (ref 140–400)
RBC: 3.94 10*6/uL — ABNORMAL LOW (ref 4.20–5.82)
RDW: 14.8 % — AB (ref 11.0–14.6)
WBC: 6 10*3/uL (ref 4.0–10.3)

## 2016-12-12 LAB — TSH: TSH: 0.839 m(IU)/L (ref 0.320–4.118)

## 2016-12-12 MED ORDER — DIPHENHYDRAMINE HCL 50 MG/ML IJ SOLN
INTRAMUSCULAR | Status: AC
  Filled 2016-12-12: qty 1

## 2016-12-12 MED ORDER — SODIUM CHLORIDE 0.9 % IV SOLN
175.0000 mg/m2 | Freq: Once | INTRAVENOUS | Status: AC
  Administered 2016-12-12: 354 mg via INTRAVENOUS
  Filled 2016-12-12: qty 59

## 2016-12-12 MED ORDER — HEPARIN SOD (PORK) LOCK FLUSH 100 UNIT/ML IV SOLN
500.0000 [IU] | Freq: Once | INTRAVENOUS | Status: AC
  Administered 2016-12-12: 500 [IU]
  Filled 2016-12-12: qty 5

## 2016-12-12 MED ORDER — PALONOSETRON HCL INJECTION 0.25 MG/5ML
0.2500 mg | Freq: Once | INTRAVENOUS | Status: AC
  Administered 2016-12-12: 0.25 mg via INTRAVENOUS

## 2016-12-12 MED ORDER — PALONOSETRON HCL INJECTION 0.25 MG/5ML
INTRAVENOUS | Status: AC
Start: 2016-12-12 — End: 2016-12-12
  Filled 2016-12-12: qty 5

## 2016-12-12 MED ORDER — PEGFILGRASTIM 6 MG/0.6ML ~~LOC~~ PSKT
6.0000 mg | PREFILLED_SYRINGE | Freq: Once | SUBCUTANEOUS | Status: DC
  Filled 2016-12-12: qty 0.6

## 2016-12-12 MED ORDER — SODIUM CHLORIDE 0.9% FLUSH
10.0000 mL | Freq: Once | INTRAVENOUS | Status: AC
  Administered 2016-12-12: 10 mL
  Filled 2016-12-12: qty 10

## 2016-12-12 MED ORDER — SODIUM CHLORIDE 0.9 % IV SOLN: Freq: Once | INTRAVENOUS | Status: DC

## 2016-12-12 MED ORDER — SODIUM CHLORIDE 0.9 % IV SOLN
449.0000 mg | Freq: Once | INTRAVENOUS | Status: AC
  Administered 2016-12-12: 450 mg via INTRAVENOUS
  Filled 2016-12-12: qty 45

## 2016-12-12 MED ORDER — SODIUM CHLORIDE 0.9% FLUSH
10.0000 mL | INTRAVENOUS | Status: DC | PRN
  Administered 2016-12-12: 10 mL
  Filled 2016-12-12: qty 10

## 2016-12-12 MED ORDER — FAMOTIDINE IN NACL 20-0.9 MG/50ML-% IV SOLN
INTRAVENOUS | Status: AC
  Filled 2016-12-12: qty 50

## 2016-12-12 MED ORDER — HEPARIN SOD (PORK) LOCK FLUSH 100 UNIT/ML IV SOLN
500.0000 [IU] | Freq: Once | INTRAVENOUS | Status: AC | PRN
  Administered 2016-12-12: 500 [IU]
  Filled 2016-12-12: qty 5

## 2016-12-12 MED ORDER — SODIUM CHLORIDE 0.9 % IV SOLN
20.0000 mg | Freq: Once | INTRAVENOUS | Status: AC
  Administered 2016-12-12: 20 mg via INTRAVENOUS
  Filled 2016-12-12: qty 2

## 2016-12-12 MED ORDER — SODIUM CHLORIDE 0.9 % IV SOLN: 200.0000 mg | Freq: Once | INTRAVENOUS | Status: DC

## 2016-12-12 MED ORDER — SODIUM CHLORIDE 0.9 % IV SOLN
Freq: Once | INTRAVENOUS | Status: AC
  Administered 2016-12-12: 12:00:00 via INTRAVENOUS

## 2016-12-12 MED ORDER — DIPHENHYDRAMINE HCL 50 MG/ML IJ SOLN
50.0000 mg | Freq: Once | INTRAMUSCULAR | Status: AC
  Administered 2016-12-12: 50 mg via INTRAVENOUS

## 2016-12-12 MED ORDER — FAMOTIDINE IN NACL 20-0.9 MG/50ML-% IV SOLN
20.0000 mg | Freq: Once | INTRAVENOUS | Status: AC
  Administered 2016-12-12: 20 mg via INTRAVENOUS

## 2016-12-12 NOTE — Progress Notes (Signed)
[place] OFFICE PROGRESS NOTE  [name], MD [address] [place] [address]  DIAGNOSIS: Stage IV (T1b, N3, M1c) non-small cell lung cancer, squamous cell carcinoma diagnosed in September 2018 presented with left infrahilar mass in addition to mediastinal and supraclavicular lymphadenopathy as well as metastatic brain lesion.  PDL1 expression 0%.  PRIOR THERAPY: SRS to the left cerebellar brain metastasis on 11/10/2016  CURRENT THERAPY:  Systemic chemotherapy with carboplatin for AUC of 5, paclitaxel 175 MG/M2 and Keytruda (pembrolizumab) 200 MG IV every 3 weeks. First dose 12/12/2016.  INTERVAL HISTORY: Steve Frank 83 y.o. male returns for a routine follow-up visit accompanied by his wife. He is seen in the infusion area today.  The patient is feeling fine today with no specific complaints.  He is currently undergoing a course of radiation to his lung mass.  He denies any fevers or chills.  Denies chest pain, shortness breath, cough, hemoptysis.  He denies nausea, vomiting, constipation, diarrhea.  The patient is here for evaluation prior to cycle 1 of his treatment.  MEDICAL HISTORY: Past Medical History:  Diagnosis Date  . Arthritis   . BPH (benign prostatic hyperplasia)   . CAD (coronary artery disease)    s/p 2 cabg's  . Cancer (HCC)    Basal and squamous cell  skin cancer  . Cerebellar mass   . Chest mass   . Diabetes ([place])    Type II  . GERD (gastroesophageal reflux disease)   . Head injury with loss of consciousness [facility])    age 60 or 55-   . Heart attack ([place])   . HLD (hyperlipidemia)   . Hypertension   . Macular degeneration   . Pneumonia     x 2 last 1071  . Stage IV squamous cell carcinoma of left lung ([place]) 10/20/2016    ALLERGIES:  is allergic to ciprofloxacin.  MEDICATIONS:  Current Outpatient Medications  Medication Sig Dispense Refill  . acetaminophen (TYLENOL) 325 MG tablet Take 650 mg by mouth every 6 (six) hours as  needed (for pain/headaches.).     Marland Kitchen albuterol (PROVENTIL HFA;VENTOLIN HFA) 108 (90 Base) MCG/ACT inhaler Inhale 2 puffs into the lungs every 6 (six) hours as needed (for wheezing/shortness of breath).     Marland Kitchen amLODipine (NORVASC) 5 MG tablet Take 5 mg by mouth daily.     . B-D INS SYR ULTRAFINE 1CC/31G 31G X 5/16" 1 ML MISC     . carvedilol (COREG) 25 MG tablet Take 25 mg by mouth 2 (two) times daily with a meal.     . Cholecalciferol (VITAMIN D-1000 MAX ST) 1000 units tablet Take 1,000 Units by mouth daily.     . clotrimazole-betamethasone (LOTRISONE) cream Apply 1 application topically 2 (two) times daily.    Marland Kitchen docusate sodium (COLACE) 100 MG capsule Take 100 mg by mouth 2 (two) times daily as needed (for constipation.).    Marland Kitchen gabapentin (NEURONTIN) 400 MG capsule Take 400 mg by mouth at bedtime.    Marland Kitchen HUMALOG 100 UNIT/ML injection INJECT 2 TO 0.12MLS (2-8 UNITS) UNDER THE SKIN 3 TIMES A DAY BEFORE MEALS, AS PER SLIDING SCALE  0  . LANTUS 100 UNIT/ML injection INJECT 0.15ML (15 UNITS TOTAL) UNDER THE SKIN DAILY AT BEDTIME.  0  . lidocaine-prilocaine (EMLA) cream Apply 1 application topically as needed. 30 g 0  . magnesium hydroxide (MILK OF MAGNESIA) 400 MG/5ML suspension Take 30 mLs by mouth daily as needed (for constipation.).    Marland Kitchen  mirtazapine (REMERON) 15 MG tablet Take 15 mg by mouth at bedtime.  0  . Multiple Vitamin (MULTIVITAMIN WITH MINERALS) TABS tablet Take 1 tablet by mouth daily.    Marland Kitchen neomycin-bacitracin-polymyxin (NEOSPORIN) [phone] ointment Apply 1 application topically at bedtime.    Marland Kitchen omeprazole (PRILOSEC) 20 MG capsule Take 20 mg by mouth at bedtime.     . polyethylene glycol (MIRALAX / GLYCOLAX) packet Take 17 g by mouth daily as needed (for constipation.).     Marland Kitchen predniSONE (DELTASONE) 1 MG tablet 2 tabs PO BID x 7 days, then 2 tabs PO QD x 7 days 42 tablet 0  . prochlorperazine (COMPAZINE) 10 MG tablet Take 1 tablet (10 mg total) by mouth every 6 (six) hours as needed for  nausea or vomiting. 30 tablet 0  . rosuvastatin (CRESTOR) 20 MG tablet Take 20 mg by mouth daily.     No current facility-administered medications for this visit.     SURGICAL HISTORY:  Past Surgical History:  Procedure Laterality Date  . CARDIAC CATHETERIZATION    . COLONOSCOPY W/ POLYPECTOMY    . CORONARY ARTERY BYPASS GRAFT     Dr Prescott Gum [facility] 07/2007  . CORONARY ARTERY BYPASS GRAFT     1st one was @ [facility] 26years ago 12/1991  . CRANIECTOMY FOR EXCISION OF BRAIN TUMOR INFRATENTORIAL / POSTERIOR FOSSA  08/05/2016   Dr Rebecka Apley  @ [facility]/Neurosurgery  . FLEXIBLE BRONCHOSCOPY  08/07/2016   Dr Gwenevere Ghazi  . IR FLUORO GUIDE PORT INSERTION RIGHT  11/18/2016  . IR US GUIDE VASC ACCESS RIGHT  11/18/2016    REVIEW OF SYSTEMS:   Review of Systems  Constitutional: Negative for appetite change, chills, fatigue, fever and unexpected weight change.  HENT:   Negative for mouth sores, nosebleeds, sore throat and trouble swallowing.   Eyes: Negative for eye problems and icterus.  Respiratory: Negative for cough, hemoptysis, shortness of breath and wheezing.   Cardiovascular: Negative for chest pain and leg swelling.  Gastrointestinal: Negative for abdominal pain, constipation, diarrhea, nausea and vomiting.  Genitourinary: Negative for bladder incontinence, difficulty urinating, dysuria, frequency and hematuria.   Musculoskeletal: Negative for back pain, gait problem, neck pain and neck stiffness.  Skin: Negative for itching and rash.  Neurological: Negative for dizziness, extremity weakness, gait problem, headaches, light-headedness and seizures.  Hematological: Negative for adenopathy. Does not bruise/bleed easily.  Psychiatric/Behavioral: Negative for confusion, depression and sleep disturbance. The patient is not nervous/anxious.     PHYSICAL EXAMINATION:  Temperature 97.0, pulse 62, respirations 18, blood pressure 131/76, O2 sat 95% on room air  ECOG PERFORMANCE STATUS:  1 - Symptomatic but completely ambulatory  Physical Exam  Constitutional: Oriented to person, place, and time and well-developed, well-nourished, and in no distress. No distress.  HENT:  Head: Normocephalic and atraumatic.  Mouth/Throat: Oropharynx is clear and moist. No oropharyngeal exudate.  Eyes: Conjunctivae are normal. Right eye exhibits no discharge. Left eye exhibits no discharge. No scleral icterus.  Neck: Normal range of motion. Neck supple.  Cardiovascular: Normal rate, regular rhythm, normal heart sounds and intact distal pulses.   Pulmonary/Chest: Effort normal and breath sounds normal. No respiratory distress. No wheezes. No rales.  Abdominal: Soft. Bowel sounds are normal. Exhibits no distension and no mass. There is no tenderness.  Musculoskeletal: Normal range of motion. Exhibits no edema.  Lymphadenopathy:    No cervical adenopathy.  Neurological: Alert and oriented to person, place, and time. Exhibits normal muscle tone. Gait normal. Coordination normal.  Skin: Skin is warm and dry. No rash noted. Not diaphoretic. No erythema. No pallor.  Psychiatric: Mood, memory and judgment normal.  Vitals reviewed.  LABORATORY DATA: Lab Results  Component Value Date   WBC 6.0 12/12/2016   HGB 12.1 (L) 12/12/2016   HCT 36.7 (L) 12/12/2016   MCV 93.2 12/12/2016   PLT 123 (L) 12/12/2016      Chemistry      Component Value Date/Time   NA 139 12/12/2016 0952   K 4.4 12/12/2016 0952   CL 105 10/14/2016 0623   CO2 24 12/12/2016 0952   BUN 20.6 12/12/2016 0952   CREATININE 0.9 12/12/2016 0952      Component Value Date/Time   CALCIUM 8.9 12/12/2016 0952   ALKPHOS 77 12/12/2016 0952   AST 25 12/12/2016 0952   ALT 19 12/12/2016 0952   BILITOT 0.46 12/12/2016 0952       RADIOGRAPHIC STUDIES:  Ir US Guide Vasc Access Right  Result Date: 11/18/2016 INDICATION: 83 year old male with a history of non-small cell carcinoma EXAM: IMPLANTED PORT A CATH PLACEMENT WITH  ULTRASOUND AND FLUOROSCOPIC GUIDANCE MEDICATIONS: 2.0 g Ancef; The antibiotic was administered within an appropriate time interval prior to skin puncture. ANESTHESIA/SEDATION: Moderate (conscious) sedation was employed during this procedure. A total of Versed 1.0 mg and Fentanyl 50 mcg was administered intravenously. Moderate Sedation Time: 20 minutes. The patient's level of consciousness and vital signs were monitored continuously by radiology nursing throughout the procedure under my direct supervision. FLUOROSCOPY TIME:  Zero minutes, 18 seconds COMPLICATIONS: None PROCEDURE: The procedure, risks, benefits, and alternatives were explained to the patient. Questions regarding the procedure were encouraged and answered. The patient understands and consents to the procedure. Ultrasound survey was performed with images stored and sent to PACs. The right neck and chest was prepped with chlorhexidine, and draped in the usual sterile fashion using maximum barrier technique (cap and mask, sterile gown, sterile gloves, large sterile sheet, hand hygiene and cutaneous antiseptic). Antibiotic prophylaxis was provided with 2.0g Ancef administered IV one hour prior to skin incision. Local anesthesia was attained by infiltration with 1% lidocaine without epinephrine. Ultrasound demonstrated patency of the right internal jugular vein, and this was documented with an image. Under real-time ultrasound guidance, this vein was accessed with a 21 gauge micropuncture needle and image documentation was performed. A small dermatotomy was made at the access site with an 11 scalpel. A 0.018" wire was advanced into the SVC and used to estimate the length of the internal catheter. The access needle exchanged for a 54F micropuncture vascular sheath. The 0.018" wire was then removed and a 0.035" wire advanced into the IVC. An appropriate location for the subcutaneous reservoir was selected below the clavicle and an incision was made through the  skin and underlying soft tissues. The subcutaneous tissues were then dissected using a combination of blunt and sharp surgical technique and a pocket was formed. A single lumen power injectable portacatheter was then tunneled through the subcutaneous tissues from the pocket to the dermatotomy and the port reservoir placed within the subcutaneous pocket. The venous access site was then serially dilated and a peel away vascular sheath placed over the wire. The wire was removed and the port catheter advanced into position under fluoroscopic guidance. The catheter tip is positioned in the cavoatrial junction. This was documented with a spot image. The portacatheter was then tested and found to flush and aspirate well. The port was flushed with saline followed by 100 units/mL heparinized saline.  The pocket was then closed in two layers using first subdermal inverted interrupted absorbable sutures followed by a running subcuticular suture. The epidermis was then sealed with Dermabond. The dermatotomy at the venous access site was also seal with Dermabond. Patient tolerated the procedure well and remained hemodynamically stable throughout. No complications encountered and no significant blood loss encountered IMPRESSION: Status post right IJ port catheter placement. Catheter ready for use. Signed, Dulcy Fanny. Earleen Newport, DO [facility] Radiology Electronically Signed   By: Corrie Mckusick D.O.   On: 11/18/2016 16:31   Ir Fluoro Guide Port Insertion Right  Result Date: 11/18/2016 INDICATION: 83 year old male with a history of non-small cell carcinoma EXAM: IMPLANTED PORT A CATH PLACEMENT WITH ULTRASOUND AND FLUOROSCOPIC GUIDANCE MEDICATIONS: 2.0 g Ancef; The antibiotic was administered within an appropriate time interval prior to skin puncture. ANESTHESIA/SEDATION: Moderate (conscious) sedation was employed during this procedure. A total of Versed 1.0 mg and Fentanyl 50 mcg was  administered intravenously. Moderate Sedation Time: 20 minutes. The patient's level of consciousness and vital signs were monitored continuously by radiology nursing throughout the procedure under my direct supervision. FLUOROSCOPY TIME:  Zero minutes, 18 seconds COMPLICATIONS: None PROCEDURE: The procedure, risks, benefits, and alternatives were explained to the patient. Questions regarding the procedure were encouraged and answered. The patient understands and consents to the procedure. Ultrasound survey was performed with images stored and sent to PACs. The right neck and chest was prepped with chlorhexidine, and draped in the usual sterile fashion using maximum barrier technique (cap and mask, sterile gown, sterile gloves, large sterile sheet, hand hygiene and cutaneous antiseptic). Antibiotic prophylaxis was provided with 2.0g Ancef administered IV one hour prior to skin incision. Local anesthesia was attained by infiltration with 1% lidocaine without epinephrine. Ultrasound demonstrated patency of the right internal jugular vein, and this was documented with an image. Under real-time ultrasound guidance, this vein was accessed with a 21 gauge micropuncture needle and image documentation was performed. A small dermatotomy was made at the access site with an 11 scalpel. A 0.018" wire was advanced into the SVC and used to estimate the length of the internal catheter. The access needle exchanged for a 55F micropuncture vascular sheath. The 0.018" wire was then removed and a 0.035" wire advanced into the IVC. An appropriate location for the subcutaneous reservoir was selected below the clavicle and an incision was made through the skin and underlying soft tissues. The subcutaneous tissues were then dissected using a combination of blunt and sharp surgical technique and a pocket was formed. A single lumen power injectable portacatheter was then tunneled through the subcutaneous tissues from the pocket to the  dermatotomy and the port reservoir placed within the subcutaneous pocket. The venous access site was then serially dilated and a peel away vascular sheath placed over the wire. The wire was removed and the port catheter advanced into position under fluoroscopic guidance. The catheter tip is positioned in the cavoatrial junction. This was documented with a spot image. The portacatheter was then tested and found to flush and aspirate well. The port was flushed with saline followed by 100 units/mL heparinized saline. The pocket was then closed in two layers using first subdermal inverted interrupted absorbable sutures followed by a running subcuticular suture. The epidermis was then sealed with Dermabond. The dermatotomy at the venous access site was also seal with Dermabond. Patient tolerated the procedure well and remained hemodynamically stable throughout. No complications encountered and no significant blood loss encountered IMPRESSION:  Status post right IJ port catheter placement. Catheter ready for use. Signed, Dulcy Fanny. Earleen Newport, DO [facility] Radiology Electronically Signed   By: Corrie Mckusick D.O.   On: 11/18/2016 16:31     ASSESSMENT/PLAN:  Stage IV squamous cell carcinoma of left lung [facility]) This is a very pleasant 83 year old white male with metastatic non-small cell lung cancer, squamous cell carcinoma with negative PDL 1 expression and enlarging solitary brain metastasis.  The patient received stereotactic radiotherapy to his brain lesion.  He is currently receiving radiation to his lung mass.  The patient proceed with cycle 1 palliative systemic chemotherapy with carboplatin for an AUC of 5 and paclitaxel 175 mg/meter squared.  We will hold the [facility] with this cycle of chemotherapy since he has about 1 month left of his radiation.  We will consider giving him the [facility] with his second cycle of chemotherapy.  The patient will have weekly labs  while he is receiving his chemotherapy.  He will keep his follow-up appointment for labs and evaluation side effects next week.  The patient was advised to call immediately if he has any concerning symptoms in the interval. The patient voices understanding of current disease status and treatment options and is in agreement with the current care plan. All questions were answered. The patient knows to call the clinic with any problems, questions or concerns. We can certainly see the patient much sooner if necessary.  No orders of the defined types were placed in this encounter.   Mikey Bussing, DNP, AGPCNP-BC, AOCNP 12/13/16

## 2016-12-12 NOTE — Progress Notes (Signed)
Per Dr. Julien Nordmann hold [name] and proceed with Taxol and Carboplatin today. Pt aware and verbalizes understanding.  Discharge and Neulasta instructions printed and verbally reviewed. Pt verbalizes understanding.

## 2016-12-12 NOTE — Patient Instructions (Signed)
[facility] Discharge Instructions for Patients Receiving Chemotherapy  Today you received the following chemotherapy agents: Keytruda, Taxol and Carboplatin   To help prevent nausea and vomiting after your treatment, we encourage you to take your nausea medication as directed.    If you develop nausea and vomiting that is not controlled by your nausea medication, call the clinic.   BELOW ARE SYMPTOMS THAT SHOULD BE REPORTED IMMEDIATELY:  *FEVER GREATER THAN 100.5 F  *CHILLS WITH OR WITHOUT FEVER  NAUSEA AND VOMITING THAT IS NOT CONTROLLED WITH YOUR NAUSEA MEDICATION  *UNUSUAL SHORTNESS OF BREATH  *UNUSUAL BRUISING OR BLEEDING  TENDERNESS IN MOUTH AND THROAT WITH OR WITHOUT PRESENCE OF ULCERS  *URINARY PROBLEMS  *BOWEL PROBLEMS  UNUSUAL RASH Items with * indicate a potential emergency and should be followed up as soon as possible.  Feel free to call the clinic should you have any questions or concerns. The clinic phone number is [phone].  Please show the [facility] at check-in to the Emergency Department and triage nurse.    Pembrolizumab injection What is this medicine? PEMBROLIZUMAB (pem broe liz ue mab) is a monoclonal antibody. It is used to treat melanoma, head and neck cancer, Hodgkin lymphoma, non-small cell lung cancer, urothelial cancer, stomach cancer, and cancers that have a certain genetic condition. This medicine may be used for other purposes; ask your health care provider or pharmacist if you have questions. COMMON BRAND NAME(S): Keytruda What should I tell my health care provider before I take this medicine? They need to know if you have any of these conditions: -diabetes -immune system problems -inflammatory bowel disease -liver disease -lung or breathing disease -lupus -organ transplant -an unusual or allergic reaction to pembrolizumab, other medicines, foods, dyes, or preservatives -pregnant or trying to get  pregnant -breast-feeding How should I use this medicine? This medicine is for infusion into a vein. It is given by a health care professional in a hospital or clinic setting. A special MedGuide will be given to you before each treatment. Be sure to read this information carefully each time. Talk to your pediatrician regarding the use of this medicine in children. While this drug may be prescribed for selected conditions, precautions do apply. Overdosage: If you think you have taken too much of this medicine contact a poison control center or emergency room at once. NOTE: This medicine is only for you. Do not share this medicine with others. What if I miss a dose? It is important not to miss your dose. Call your doctor or health care professional if you are unable to keep an appointment. What may interact with this medicine? Interactions have not been studied. Give your health care provider a list of all the medicines, herbs, non-prescription drugs, or dietary supplements you use. Also tell them if you smoke, drink alcohol, or use illegal drugs. Some items may interact with your medicine. This list may not describe all possible interactions. Give your health care provider a list of all the medicines, herbs, non-prescription drugs, or dietary supplements you use. Also tell them if you smoke, drink alcohol, or use illegal drugs. Some items may interact with your medicine. What should I watch for while using this medicine? Your condition will be monitored carefully while you are receiving this medicine. You may need blood work done while you are taking this medicine. Do not become pregnant while taking this medicine or for 4 months after stopping it. Women should inform their doctor if they wish  to become pregnant or think they might be pregnant. There is a potential for serious side effects to an unborn child. Talk to your health care professional or pharmacist for more information. Do not breast-feed  an infant while taking this medicine or for 4 months after the last dose. What side effects may I notice from receiving this medicine? Side effects that you should report to your doctor or health care professional as soon as possible: -allergic reactions like skin rash, itching or hives, swelling of the face, lips, or tongue -bloody or black, tarry -breathing problems -changes in vision -chest pain -chills -constipation -cough -dizziness or feeling faint or lightheaded -fast or irregular heartbeat -fever -flushing -hair loss -low blood counts - this medicine may decrease the number of white blood cells, red blood cells and platelets. You may be at increased risk for infections and bleeding. -muscle pain -muscle weakness -persistent headache -signs and symptoms of high blood sugar such as dizziness; dry mouth; dry skin; fruity breath; nausea; stomach pain; increased hunger or thirst; increased urination -signs and symptoms of kidney injury like trouble passing urine or change in the amount of urine -signs and symptoms of liver injury like dark urine, light-colored stools, loss of appetite, nausea, right upper belly pain, yellowing of the eyes or skin -stomach pain -sweating -weight loss Side effects that usually do not require medical attention (report to your doctor or health care professional if they continue or are bothersome): -decreased appetite -diarrhea -tiredness This list may not describe all possible side effects. Call your doctor for medical advice about side effects. You may report side effects to FDA at 1-800-FDA-1088. Where should I keep my medicine? This drug is given in a hospital or clinic and will not be stored at home. NOTE: This sheet is a summary. It may not cover all possible information. If you have questions about this medicine, talk to your doctor, pharmacist, or health care provider.  2018 Elsevier/Gold Standard (2015-11-03 12:29:36)     Paclitaxel  injection What is this medicine? PACLITAXEL (PAK li TAX el) is a chemotherapy drug. It targets fast dividing cells, like cancer cells, and causes these cells to die. This medicine is used to treat ovarian cancer, breast cancer, and other cancers. This medicine may be used for other purposes; ask your health care provider or pharmacist if you have questions. COMMON BRAND NAME(S): Onxol, Taxol What should I tell my health care provider before I take this medicine? They need to know if you have any of these conditions: -blood disorders -irregular heartbeat -infection (especially a virus infection such as chickenpox, cold sores, or herpes) -liver disease -previous or ongoing radiation therapy -an unusual or allergic reaction to paclitaxel, alcohol, polyoxyethylated castor oil, other chemotherapy agents, other medicines, foods, dyes, or preservatives -pregnant or trying to get pregnant -breast-feeding How should I use this medicine? This drug is given as an infusion into a vein. It is administered in a hospital or clinic by a specially trained health care professional. Talk to your pediatrician regarding the use of this medicine in children. Special care may be needed. Overdosage: If you think you have taken too much of this medicine contact a poison control center or emergency room at once. NOTE: This medicine is only for you. Do not share this medicine with others. What if I miss a dose? It is important not to miss your dose. Call your doctor or health care professional if you are unable to keep an appointment. What may interact with  this medicine? Do not take this medicine with any of the following medications: -disulfiram -metronidazole This medicine may also interact with the following medications: -cyclosporine -diazepam -ketoconazole -medicines to increase blood counts like filgrastim, pegfilgrastim, sargramostim -other chemotherapy drugs like cisplatin, doxorubicin, epirubicin,  etoposide, teniposide, vincristine -quinidine -testosterone -vaccines -verapamil Talk to your doctor or health care professional before taking any of these medicines: -acetaminophen -aspirin -ibuprofen -ketoprofen -naproxen This list may not describe all possible interactions. Give your health care provider a list of all the medicines, herbs, non-prescription drugs, or dietary supplements you use. Also tell them if you smoke, drink alcohol, or use illegal drugs. Some items may interact with your medicine. What should I watch for while using this medicine? Your condition will be monitored carefully while you are receiving this medicine. You will need important blood work done while you are taking this medicine. This medicine can cause serious allergic reactions. To reduce your risk you will need to take other medicine(s) before treatment with this medicine. If you experience allergic reactions like skin rash, itching or hives, swelling of the face, lips, or tongue, tell your doctor or health care professional right away. In some cases, you may be given additional medicines to help with side effects. Follow all directions for their use. This drug may make you feel generally unwell. This is not uncommon, as chemotherapy can affect healthy cells as well as cancer cells. Report any side effects. Continue your course of treatment even though you feel ill unless your doctor tells you to stop. Call your doctor or health care professional for advice if you get a fever, chills or sore throat, or other symptoms of a cold or flu. Do not treat yourself. This drug decreases your body's ability to fight infections. Try to avoid being around people who are sick. This medicine may increase your risk to bruise or bleed. Call your doctor or health care professional if you notice any unusual bleeding. Be careful brushing and flossing your teeth or using a toothpick because you may get an infection or bleed more  easily. If you have any dental work done, tell your dentist you are receiving this medicine. Avoid taking products that contain aspirin, acetaminophen, ibuprofen, naproxen, or ketoprofen unless instructed by your doctor. These medicines may hide a fever. Do not become pregnant while taking this medicine. Women should inform their doctor if they wish to become pregnant or think they might be pregnant. There is a potential for serious side effects to an unborn child. Talk to your health care professional or pharmacist for more information. Do not breast-feed an infant while taking this medicine. Men are advised not to father a child while receiving this medicine. This product may contain alcohol. Ask your pharmacist or healthcare provider if this medicine contains alcohol. Be sure to tell all healthcare providers you are taking this medicine. Certain medicines, like metronidazole and disulfiram, can cause an unpleasant reaction when taken with alcohol. The reaction includes flushing, headache, nausea, vomiting, sweating, and increased thirst. The reaction can last from 30 minutes to several hours. What side effects may I notice from receiving this medicine? Side effects that you should report to your doctor or health care professional as soon as possible: -allergic reactions like skin rash, itching or hives, swelling of the face, lips, or tongue -low blood counts - This drug may decrease the number of white blood cells, red blood cells and platelets. You may be at increased risk for infections and bleeding. -signs of  infection - fever or chills, cough, sore throat, pain or difficulty passing urine -signs of decreased platelets or bleeding - bruising, pinpoint red spots on the skin, black, tarry stools, nosebleeds -signs of decreased red blood cells - unusually weak or tired, fainting spells, lightheadedness -breathing problems -chest pain -high or low blood pressure -mouth sores -nausea and  vomiting -pain, swelling, redness or irritation at the injection site -pain, tingling, numbness in the hands or feet -slow or irregular heartbeat -swelling of the ankle, feet, hands Side effects that usually do not require medical attention (report to your doctor or health care professional if they continue or are bothersome): -bone pain -complete hair loss including hair on your head, underarms, pubic hair, eyebrows, and eyelashes -changes in the color of fingernails -diarrhea -loosening of the fingernails -loss of appetite -muscle or joint pain -red flush to skin -sweating This list may not describe all possible side effects. Call your doctor for medical advice about side effects. You may report side effects to FDA at 1-800-FDA-1088. Where should I keep my medicine? This drug is given in a hospital or clinic and will not be stored at home. NOTE: This sheet is a summary. It may not cover all possible information. If you have questions about this medicine, talk to your doctor, pharmacist, or health care provider.  2018 Elsevier/Gold Standard (2014-11-25 19:58:00)   Carboplatin injection What is this medicine? CARBOPLATIN (KAR boe pla tin) is a chemotherapy drug. It targets fast dividing cells, like cancer cells, and causes these cells to die. This medicine is used to treat ovarian cancer and many other cancers. This medicine may be used for other purposes; ask your health care provider or pharmacist if you have questions. COMMON BRAND NAME(S): Paraplatin What should I tell my health care provider before I take this medicine? They need to know if you have any of these conditions: -blood disorders -hearing problems -kidney disease -recent or ongoing radiation therapy -an unusual or allergic reaction to carboplatin, cisplatin, other chemotherapy, other medicines, foods, dyes, or preservatives -pregnant or trying to get pregnant -breast-feeding How should I use this medicine? This  drug is usually given as an infusion into a vein. It is administered in a hospital or clinic by a specially trained health care professional. Talk to your pediatrician regarding the use of this medicine in children. Special care may be needed. Overdosage: If you think you have taken too much of this medicine contact a poison control center or emergency room at once. NOTE: This medicine is only for you. Do not share this medicine with others. What if I miss a dose? It is important not to miss a dose. Call your doctor or health care professional if you are unable to keep an appointment. What may interact with this medicine? -medicines for seizures -medicines to increase blood counts like filgrastim, pegfilgrastim, sargramostim -some antibiotics like amikacin, gentamicin, neomycin, streptomycin, tobramycin -vaccines Talk to your doctor or health care professional before taking any of these medicines: -acetaminophen -aspirin -ibuprofen -ketoprofen -naproxen This list may not describe all possible interactions. Give your health care provider a list of all the medicines, herbs, non-prescription drugs, or dietary supplements you use. Also tell them if you smoke, drink alcohol, or use illegal drugs. Some items may interact with your medicine. What should I watch for while using this medicine? Your condition will be monitored carefully while you are receiving this medicine. You will need important blood work done while you are taking this medicine. This drug  may make you feel generally unwell. This is not uncommon, as chemotherapy can affect healthy cells as well as cancer cells. Report any side effects. Continue your course of treatment even though you feel ill unless your doctor tells you to stop. In some cases, you may be given additional medicines to help with side effects. Follow all directions for their use. Call your doctor or health care professional for advice if you get a fever, chills or sore  throat, or other symptoms of a cold or flu. Do not treat yourself. This drug decreases your body's ability to fight infections. Try to avoid being around people who are sick. This medicine may increase your risk to bruise or bleed. Call your doctor or health care professional if you notice any unusual bleeding. Be careful brushing and flossing your teeth or using a toothpick because you may get an infection or bleed more easily. If you have any dental work done, tell your dentist you are receiving this medicine. Avoid taking products that contain aspirin, acetaminophen, ibuprofen, naproxen, or ketoprofen unless instructed by your doctor. These medicines may hide a fever. Do not become pregnant while taking this medicine. Women should inform their doctor if they wish to become pregnant or think they might be pregnant. There is a potential for serious side effects to an unborn child. Talk to your health care professional or pharmacist for more information. Do not breast-feed an infant while taking this medicine. What side effects may I notice from receiving this medicine? Side effects that you should report to your doctor or health care professional as soon as possible: -allergic reactions like skin rash, itching or hives, swelling of the face, lips, or tongue -signs of infection - fever or chills, cough, sore throat, pain or difficulty passing urine -signs of decreased platelets or bleeding - bruising, pinpoint red spots on the skin, black, tarry stools, nosebleeds -signs of decreased red blood cells - unusually weak or tired, fainting spells, lightheadedness -breathing problems -changes in hearing -changes in vision -chest pain -high blood pressure -low blood counts - This drug may decrease the number of white blood cells, red blood cells and platelets. You may be at increased risk for infections and bleeding. -nausea and vomiting -pain, swelling, redness or irritation at the injection site -pain,  tingling, numbness in the hands or feet -problems with balance, talking, walking -trouble passing urine or change in the amount of urine Side effects that usually do not require medical attention (report to your doctor or health care professional if they continue or are bothersome): -hair loss -loss of appetite -metallic taste in the mouth or changes in taste This list may not describe all possible side effects. Call your doctor for medical advice about side effects. You may report side effects to FDA at 1-800-FDA-1088. Where should I keep my medicine? This drug is given in a hospital or clinic and will not be stored at home. NOTE: This sheet is a summary. It may not cover all possible information. If you have questions about this medicine, talk to your doctor, pharmacist, or health care provider.  2018 [employer]/Gold Standard (2007-05-01 14:38:05)    Pegfilgrastim injection What is this medicine? PEGFILGRASTIM (PEG fil gra stim) is a long-acting granulocyte colony-stimulating factor that stimulates the growth of neutrophils, a type of white blood cell important in the body's fight against infection. It is used to reduce the incidence of fever and infection in patients with certain types of cancer who are receiving chemotherapy that affects  the bone marrow, and to increase survival after being exposed to high doses of radiation. This medicine may be used for other purposes; ask your health care provider or pharmacist if you have questions. COMMON BRAND NAME(S): Neulasta What should I tell my health care provider before I take this medicine? They need to know if you have any of these conditions: -kidney disease -latex allergy -ongoing radiation therapy -sickle cell disease -skin reactions to acrylic adhesives (On-Body Injector only) -an unusual or allergic reaction to pegfilgrastim, filgrastim, other medicines, foods, dyes, or preservatives -pregnant or trying to get  pregnant -breast-feeding How should I use this medicine? This medicine is for injection under the skin. If you get this medicine at home, you will be taught how to prepare and give the pre-filled syringe or how to use the On-body Injector. Refer to the patient Instructions for Use for detailed instructions. Use exactly as directed. Tell your healthcare provider immediately if you suspect that the On-body Injector may not have performed as intended or if you suspect the use of the On-body Injector resulted in a missed or partial dose. It is important that you put your used needles and syringes in a special sharps container. Do not put them in a trash can. If you do not have a sharps container, call your pharmacist or healthcare provider to get one. Talk to your pediatrician regarding the use of this medicine in children. While this drug may be prescribed for selected conditions, precautions do apply. Overdosage: If you think you have taken too much of this medicine contact a poison control center or emergency room at once. NOTE: This medicine is only for you. Do not share this medicine with others. What if I miss a dose? It is important not to miss your dose. Call your doctor or health care professional if you miss your dose. If you miss a dose due to an On-body Injector failure or leakage, a new dose should be administered as soon as possible using a single prefilled syringe for manual use. What may interact with this medicine? Interactions have not been studied. Give your health care provider a list of all the medicines, herbs, non-prescription drugs, or dietary supplements you use. Also tell them if you smoke, drink alcohol, or use illegal drugs. Some items may interact with your medicine. This list may not describe all possible interactions. Give your health care provider a list of all the medicines, herbs, non-prescription drugs, or dietary supplements you use. Also tell them if you smoke, drink  alcohol, or use illegal drugs. Some items may interact with your medicine. What should I watch for while using this medicine? You may need blood work done while you are taking this medicine. If you are going to need a MRI, CT scan, or other procedure, tell your doctor that you are using this medicine (On-Body Injector only). What side effects may I notice from receiving this medicine? Side effects that you should report to your doctor or health care professional as soon as possible: -allergic reactions like skin rash, itching or hives, swelling of the face, lips, or tongue -dizziness -fever -pain, redness, or irritation at site where injected -pinpoint red spots on the skin -red or dark-brown urine -shortness of breath or breathing problems -stomach or side pain, or pain at the shoulder -swelling -tiredness -trouble passing urine or change in the amount of urine Side effects that usually do not require medical attention (report to your doctor or health care professional if they continue  or are bothersome): -bone pain -muscle pain This list may not describe all possible side effects. Call your doctor for medical advice about side effects. You may report side effects to FDA at 1-800-FDA-1088. Where should I keep my medicine? Keep out of the reach of children. Store pre-filled syringes in a refrigerator between 2 and 8 degrees C (36 and 46 degrees F). Do not freeze. Keep in carton to protect from light. Throw away this medicine if it is left out of the refrigerator for more than 48 hours. Throw away any unused medicine after the expiration date. NOTE: This sheet is a summary. It may not cover all possible information. If you have questions about this medicine, talk to your doctor, pharmacist, or health care provider.  2018 [employer]/Gold Standard (2016-01-21 12:58:03)

## 2016-12-13 ENCOUNTER — Ambulatory Visit
Admission: RE | Admit: 2016-12-13 | Discharge: 2016-12-13 | Disposition: A | Discharge status: Home / Self Care | Payer: Medicare Other | Source: Ambulatory Visit | Attending: Radiation Oncology | Admitting: Radiation Oncology

## 2016-12-13 ENCOUNTER — Encounter: Payer: Self-pay | Admitting: Oncology

## 2016-12-13 ENCOUNTER — Telehealth: Payer: Self-pay | Admitting: Oncology

## 2016-12-13 DIAGNOSIS — Z510 Encounter for antineoplastic radiation therapy: Secondary | ICD-10-CM | POA: Diagnosis not present

## 2016-12-13 NOTE — Assessment & Plan Note (Signed)
This is a very pleasant 83 year old white male with metastatic non-small cell lung cancer, squamous cell carcinoma with negative PDL 1 expression and enlarging solitary brain metastasis.  The patient received stereotactic radiotherapy to his brain lesion.  He is currently receiving radiation to his lung mass.  The patient proceed with cycle 1 palliative systemic chemotherapy with carboplatin for an AUC of 5 and paclitaxel 175 mg/meter squared.  We will hold the [facility] with this cycle of chemotherapy since he has about 1 month left of his radiation.  We will consider giving him the [facility] with his second cycle of chemotherapy.  The patient will have weekly labs while he is receiving his chemotherapy.  He will keep his follow-up appointment for labs and evaluation side effects next week.  The patient was advised to call immediately if he has any concerning symptoms in the interval. The patient voices understanding of current disease status and treatment options and is in agreement with the current care plan. All questions were answered. The patient knows to call the clinic with any problems, questions or concerns. We can certainly see the patient much sooner if necessary.

## 2016-12-13 NOTE — Telephone Encounter (Signed)
Left message for patient regarding updates to schedule per 11/5 sch message.

## 2016-12-13 NOTE — Telephone Encounter (Signed)
No 11/5 los.

## 2016-12-14 ENCOUNTER — Ambulatory Visit: Payer: Medicare Other

## 2016-12-14 ENCOUNTER — Other Ambulatory Visit: Payer: Self-pay | Admitting: Internal Medicine

## 2016-12-14 ENCOUNTER — Ambulatory Visit
Admission: RE | Admit: 2016-12-14 | Discharge: 2016-12-14 | Disposition: A | Discharge status: Home / Self Care | Payer: Medicare Other | Source: Ambulatory Visit | Attending: Radiation Oncology | Admitting: Radiation Oncology

## 2016-12-14 ENCOUNTER — Telehealth: Payer: Self-pay | Admitting: Medical Oncology

## 2016-12-14 DIAGNOSIS — Z510 Encounter for antineoplastic radiation therapy: Secondary | ICD-10-CM | POA: Diagnosis not present

## 2016-12-14 NOTE — Telephone Encounter (Signed)
Saw pt in lobby after xrt.  Pt siting in a wheelchair and feels okay HE and wife were instructed  to call for temp > 100.41f or any signs of infection. He did not get Onpro 12/12/16 because it was unclear if he could get device while undergoing chemo. Per Julien Nordmann he is not getting any injection today.

## 2016-12-15 ENCOUNTER — Other Ambulatory Visit: Payer: Self-pay | Admitting: Oncology

## 2016-12-15 ENCOUNTER — Ambulatory Visit
Admission: RE | Admit: 2016-12-15 | Discharge: 2016-12-15 | Disposition: A | Discharge status: Home / Self Care | Payer: Medicare Other | Source: Ambulatory Visit | Attending: Radiation Oncology | Admitting: Radiation Oncology

## 2016-12-15 DIAGNOSIS — Z510 Encounter for antineoplastic radiation therapy: Secondary | ICD-10-CM | POA: Diagnosis not present

## 2016-12-16 ENCOUNTER — Ambulatory Visit
Admission: RE | Admit: 2016-12-16 | Discharge: 2016-12-16 | Disposition: A | Discharge status: Home / Self Care | Payer: Medicare Other | Source: Ambulatory Visit | Attending: Radiation Oncology | Admitting: Radiation Oncology

## 2016-12-16 ENCOUNTER — Telehealth: Payer: Self-pay | Admitting: Radiation Oncology

## 2016-12-16 ENCOUNTER — Telehealth: Payer: Self-pay | Admitting: Oncology

## 2016-12-16 DIAGNOSIS — Z510 Encounter for antineoplastic radiation therapy: Secondary | ICD-10-CM | POA: Diagnosis not present

## 2016-12-16 NOTE — Progress Notes (Signed)
Received patient and his wife in the clinic following radiation treatment today. Patient requesting "something for cough." Patient reports a dry hacking cough that kept him up last night. Explained this is normal and related to the effects of radiation therapy. Patient denies fever. Patient denies hemoptysis. Per [employer], PA-C instructed the patient and his wife to pick up OTC Robitussin DM. In addition, encouraged him or his wife to inform this RN if no relief is accomplished with the OTC medication such that a prescription can be obtained. Both verbalized understanding of all reviewed.

## 2016-12-16 NOTE — Telephone Encounter (Signed)
Received voicemail message from patient's wife, Steve Frank, at 48 requesting a return call. Steve Frank states, "my husband is having bleeding" but did not say where from or how much on the message. Phoned back to inquire. No answer. Left message with contact information of [phone] before 1700 and [phone] after 1700.

## 2016-12-16 NOTE — Telephone Encounter (Signed)
No 11/5 los.

## 2016-12-19 ENCOUNTER — Ambulatory Visit
Admission: RE | Admit: 2016-12-19 | Discharge: 2016-12-19 | Disposition: A | Discharge status: Home / Self Care | Payer: Medicare Other | Source: Ambulatory Visit | Attending: Radiation Oncology | Admitting: Radiation Oncology

## 2016-12-20 ENCOUNTER — Other Ambulatory Visit (HOSPITAL_BASED_OUTPATIENT_CLINIC_OR_DEPARTMENT_OTHER): Payer: Medicare Other

## 2016-12-20 ENCOUNTER — Ambulatory Visit
Admission: RE | Admit: 2016-12-20 | Discharge: 2016-12-20 | Disposition: A | Discharge status: Home / Self Care | Payer: Medicare Other | Source: Ambulatory Visit | Attending: Radiation Oncology | Admitting: Radiation Oncology

## 2016-12-20 ENCOUNTER — Telehealth: Payer: Self-pay | Admitting: Internal Medicine

## 2016-12-20 ENCOUNTER — Telehealth: Payer: Self-pay | Admitting: Radiation Oncology

## 2016-12-20 ENCOUNTER — Telehealth: Payer: Self-pay | Admitting: Oncology

## 2016-12-20 ENCOUNTER — Encounter: Payer: Self-pay | Admitting: Oncology

## 2016-12-20 ENCOUNTER — Ambulatory Visit (HOSPITAL_BASED_OUTPATIENT_CLINIC_OR_DEPARTMENT_OTHER): Payer: Medicare Other | Admitting: Oncology

## 2016-12-20 VITALS — BP 101/53 | HR 59 | Temp 97.5°F | Resp 19 | Ht 71.0 in | Wt 177.3 lb

## 2016-12-20 DIAGNOSIS — C7931 Secondary malignant neoplasm of brain: Secondary | ICD-10-CM | POA: Diagnosis not present

## 2016-12-20 DIAGNOSIS — D696 Thrombocytopenia, unspecified: Secondary | ICD-10-CM

## 2016-12-20 DIAGNOSIS — D701 Agranulocytosis secondary to cancer chemotherapy: Secondary | ICD-10-CM

## 2016-12-20 DIAGNOSIS — K209 Esophagitis, unspecified without bleeding: Secondary | ICD-10-CM

## 2016-12-20 DIAGNOSIS — L309 Dermatitis, unspecified: Secondary | ICD-10-CM | POA: Diagnosis not present

## 2016-12-20 DIAGNOSIS — C3492 Malignant neoplasm of unspecified part of left bronchus or lung: Secondary | ICD-10-CM | POA: Diagnosis present

## 2016-12-20 DIAGNOSIS — K5900 Constipation, unspecified: Secondary | ICD-10-CM | POA: Diagnosis not present

## 2016-12-20 DIAGNOSIS — Z95828 Presence of other vascular implants and grafts: Secondary | ICD-10-CM

## 2016-12-20 DIAGNOSIS — T451X5A Adverse effect of antineoplastic and immunosuppressive drugs, initial encounter: Secondary | ICD-10-CM

## 2016-12-20 DIAGNOSIS — D709 Neutropenia, unspecified: Secondary | ICD-10-CM | POA: Insufficient documentation

## 2016-12-20 LAB — CBC WITH DIFFERENTIAL/PLATELET
BASO%: 1.4 % (ref 0.0–2.0)
Basophils Absolute: 0 10*3/uL (ref 0.0–0.1)
EOS ABS: 0.2 10*3/uL (ref 0.0–0.5)
EOS%: 23.2 % — ABNORMAL HIGH (ref 0.0–7.0)
HEMATOCRIT: 34.8 % — AB (ref 38.4–49.9)
HGB: 11.6 g/dL — ABNORMAL LOW (ref 13.0–17.1)
LYMPH%: 11.6 % — AB (ref 14.0–49.0)
MCH: 30.6 pg (ref 27.2–33.4)
MCHC: 33.3 g/dL (ref 32.0–36.0)
MCV: 91.8 fL (ref 79.3–98.0)
MONO#: 0.1 10*3/uL (ref 0.1–0.9)
MONO%: 14.5 % — ABNORMAL HIGH (ref 0.0–14.0)
NEUT%: 49.3 % (ref 39.0–75.0)
NEUTROS ABS: 0.3 10*3/uL — AB (ref 1.5–6.5)
PLATELETS: 82 10*3/uL — AB (ref 140–400)
RBC: 3.79 10*6/uL — AB (ref 4.20–5.82)
RDW: 14.2 % (ref 11.0–14.6)
WBC: 0.7 10*3/uL — AB (ref 4.0–10.3)
lymph#: 0.1 10*3/uL — ABNORMAL LOW (ref 0.9–3.3)
nRBC: 0 % (ref 0–0)

## 2016-12-20 LAB — COMPREHENSIVE METABOLIC PANEL
ALBUMIN: 2.6 g/dL — AB (ref 3.5–5.0)
ALK PHOS: 60 U/L (ref 40–150)
ALT: 19 U/L (ref 0–55)
AST: 19 U/L (ref 5–34)
Anion Gap: 7 mEq/L (ref 3–11)
BUN: 26.3 mg/dL — AB (ref 7.0–26.0)
CALCIUM: 8.6 mg/dL (ref 8.4–10.4)
CO2: 22 mEq/L (ref 22–29)
CREATININE: 0.9 mg/dL (ref 0.7–1.3)
Chloride: 106 mEq/L (ref 98–109)
EGFR: >60 mL/min/{1.73_m2} (ref >60)
GLUCOSE: 158 mg/dL — AB (ref 70–140)
Potassium: 4.4 mEq/L (ref 3.5–5.1)
SODIUM: 136 meq/L (ref 136–145)
Total Bilirubin: 0.4 mg/dL (ref 0.20–1.20)
Total Protein: 5.9 g/dL — ABNORMAL LOW (ref 6.4–8.3)

## 2016-12-20 MED ORDER — SUCRALFATE 1 GM/10ML PO SUSP: 1.0000 g | Freq: Three times a day (TID) | ORAL | 2 refills | Status: DC

## 2016-12-20 MED ORDER — TRIAMCINOLONE ACETONIDE 0.025 % EX OINT: 1.0000 "application " | TOPICAL_OINTMENT | Freq: Two times a day (BID) | CUTANEOUS | 1 refills | Status: AC

## 2016-12-20 MED ORDER — TBO-FILGRASTIM 480 MCG/0.8ML ~~LOC~~ SOSY
480.0000 ug | PREFILLED_SYRINGE | Freq: Once | SUBCUTANEOUS | Status: AC
  Administered 2016-12-20: 480 ug via SUBCUTANEOUS
  Filled 2016-12-20: qty 0.8

## 2016-12-20 NOTE — Progress Notes (Signed)
[place] OFFICE PROGRESS NOTE  [name], MD [address] [place] [address]  DIAGNOSIS: Stage IV (T1b, N3, M1c) non-small cell lung cancer, squamous cell carcinoma diagnosed in September 2018 presented with left infrahilar mass in addition to mediastinal and supraclavicular lymphadenopathy as well as metastatic brain lesion.  PDL1 expression 0%.  PRIOR THERAPY: SRS to the left cerebellar brain metastasis on 11/10/2016  CURRENT THERAPY: Systemic chemotherapy with carboplatin for AUC of 5, paclitaxel 175 MG/M2 and Keytruda (pembrolizumab) 200 MG IV every 3 weeks. First dose 12/12/2016.  Status post 1 cycle.  [name] was held with the first cycle due to ongoing radiation.  INTERVAL HISTORY: JAMEISON HAJI 83 y.o. male returns for routine follow-up visit accompanied by his wife.  The patient tolerated his first cycle of chemotherapy well overall with the exception of development of a rash to his bilateral arms and constipation.  Rash has been present for about a week.  He is not putting anything on the rash and it is itching. He has not had any fevers or chills.  He denies chest pain shortness of breath and hemoptysis.  He has a nonproductive cough.  Denies nausea, vomiting, diarrhea.  He has been having constipation is not taken anything for this.  He reports some pain with swallowing due to radiation.  The patient has requested a referral for physical therapy due to generalized weakness.  Patient's wife states that she has a physical therapy company she would like a referral to, but does not know the name.  The patient is here for evaluation and repeat blood work.  MEDICAL HISTORY: Past Medical History:  Diagnosis Date  . Arthritis   . BPH (benign prostatic hyperplasia)   . CAD (coronary artery disease)    s/p 2 cabg's  . Cancer (HCC)    Basal and squamous cell  skin cancer  . Cerebellar mass   . Chest mass   . Diabetes ([place])    Type II  . GERD  (gastroesophageal reflux disease)   . Head injury with loss of consciousness [facility])    age 87 or 34-   . Heart attack ([place])   . HLD (hyperlipidemia)   . Hypertension   . Macular degeneration   . Pneumonia     x 2 last 1071  . Stage IV squamous cell carcinoma of left lung ([place]) 10/20/2016    ALLERGIES:  is allergic to ciprofloxacin.  MEDICATIONS:  Current Outpatient Medications  Medication Sig Dispense Refill  . acetaminophen (TYLENOL) 325 MG tablet Take 650 mg by mouth every 6 (six) hours as needed (for pain/headaches.).     Marland Kitchen albuterol (PROVENTIL HFA;VENTOLIN HFA) 108 (90 Base) MCG/ACT inhaler Inhale 2 puffs into the lungs every 6 (six) hours as needed (for wheezing/shortness of breath).     Marland Kitchen amLODipine (NORVASC) 5 MG tablet Take 5 mg by mouth daily.     . B-D INS SYR ULTRAFINE 1CC/31G 31G X 5/16" 1 ML MISC     . carvedilol (COREG) 25 MG tablet Take 25 mg by mouth 2 (two) times daily with a meal.     . Cholecalciferol (VITAMIN D-1000 MAX ST) 1000 units tablet Take 1,000 Units by mouth daily.     . clotrimazole-betamethasone (LOTRISONE) cream Apply 1 application topically 2 (two) times daily.    Marland Kitchen docusate sodium (COLACE) 100 MG capsule Take 100 mg by mouth 2 (two) times daily as needed (for constipation.).    Marland Kitchen gabapentin (NEURONTIN) 400 MG  capsule Take 400 mg by mouth at bedtime.    Marland Kitchen HUMALOG 100 UNIT/ML injection INJECT 2 TO 0.12MLS (2-8 UNITS) UNDER THE SKIN 3 TIMES A DAY BEFORE MEALS, AS PER SLIDING SCALE  0  . LANTUS 100 UNIT/ML injection INJECT 0.15ML (15 UNITS TOTAL) UNDER THE SKIN DAILY AT BEDTIME.  0  . lidocaine-prilocaine (EMLA) cream Apply 1 application topically as needed. 30 g 0  . magnesium hydroxide (MILK OF MAGNESIA) 400 MG/5ML suspension Take 30 mLs by mouth daily as needed (for constipation.).    Marland Kitchen mirtazapine (REMERON) 15 MG tablet Take 15 mg by mouth at bedtime.  0  . Multiple Vitamin (MULTIVITAMIN WITH MINERALS) TABS tablet Take 1 tablet by mouth daily.    Marland Kitchen  neomycin-bacitracin-polymyxin (NEOSPORIN) [phone] ointment Apply 1 application topically at bedtime.    Marland Kitchen omeprazole (PRILOSEC) 20 MG capsule Take 20 mg by mouth at bedtime.     . polyethylene glycol (MIRALAX / GLYCOLAX) packet Take 17 g by mouth daily as needed (for constipation.).     Marland Kitchen predniSONE (DELTASONE) 1 MG tablet 2 tabs PO BID x 7 days, then 2 tabs PO QD x 7 days 42 tablet 0  . prochlorperazine (COMPAZINE) 10 MG tablet Take 1 tablet (10 mg total) by mouth every 6 (six) hours as needed for nausea or vomiting. 30 tablet 0  . rosuvastatin (CRESTOR) 20 MG tablet Take 20 mg by mouth daily.    . sucralfate (CARAFATE) 1 GM/10ML suspension Take 10 mLs (1 g total) 4 (four) times daily -  with meals and at bedtime by mouth. 420 mL 2  . triamcinolone (KENALOG) 0.025 % ointment Apply 1 application 2 (two) times daily topically. 30 g 1   No current facility-administered medications for this visit.     SURGICAL HISTORY:  Past Surgical History:  Procedure Laterality Date  . CARDIAC CATHETERIZATION    . COLONOSCOPY W/ POLYPECTOMY    . CORONARY ARTERY BYPASS GRAFT     Dr Prescott Gum [facility] 07/2007  . CORONARY ARTERY BYPASS GRAFT     1st one was @ [facility] 26years ago 12/1991  . CRANIECTOMY FOR EXCISION OF BRAIN TUMOR INFRATENTORIAL / POSTERIOR FOSSA  08/05/2016   Dr Rebecka Apley  @ [facility]/Neurosurgery  . FLEXIBLE BRONCHOSCOPY  08/07/2016   Dr Gwenevere Ghazi  . IR FLUORO GUIDE PORT INSERTION RIGHT  11/18/2016  . IR US GUIDE VASC ACCESS RIGHT  11/18/2016    REVIEW OF SYSTEMS:   Review of Systems  Constitutional: Negative for appetite change, chills, fatigue, fever and unexpected weight change.  HENT:   Negative for mouth sores, nosebleeds, sore throat and trouble swallowing.  Reports pain with swallowing.  Eyes: Negative for eye problems and icterus.  Respiratory: Negative for cough, hemoptysis, shortness of breath and wheezing.   Cardiovascular: Negative for chest pain and leg swelling.    Gastrointestinal: Negative for abdominal pain, diarrhea, nausea and vomiting. Positive for constipation. Genitourinary: Negative for bladder incontinence, difficulty urinating, dysuria, frequency and hematuria.   Musculoskeletal: Negative for back pain, gait problem, neck pain and neck stiffness.  Skin: Positive for itching and rash. Neurological: Negative for dizziness, extremity weakness, gait problem, headaches, light-headedness and seizures.  Hematological: Negative for adenopathy. Does not bruise/bleed easily.  Psychiatric/Behavioral: Negative for confusion, depression and sleep disturbance. The patient is not nervous/anxious.     PHYSICAL EXAMINATION:  Blood pressure (!) 101/53, pulse (!) 59, temperature (!) 97.5 F (36.4 C), temperature source Oral, resp. rate 19, height _0  (1.803 m),  weight 177 lb 4.8 oz (80.4 kg), SpO2 95 %.  ECOG PERFORMANCE STATUS: 1 - Symptomatic but completely ambulatory  Physical Exam  Constitutional: Oriented to person, place, and time and well-developed, well-nourished, and in no distress. No distress.  HENT:  Head: Normocephalic and atraumatic.  Mouth/Throat: Oropharynx is clear and moist. No oropharyngeal exudate.  Eyes: Conjunctivae are normal. Right eye exhibits no discharge. Left eye exhibits no discharge. No scleral icterus.  Neck: Normal range of motion. Neck supple.  Cardiovascular: Normal rate, regular rhythm, normal heart sounds and intact distal pulses.   Pulmonary/Chest: Effort normal and breath sounds normal. No respiratory distress. No wheezes. No rales.  Abdominal: Soft. Bowel sounds are normal. Exhibits no distension and no mass. There is no tenderness.  Musculoskeletal: Normal range of motion. Exhibits no edema.  Lymphadenopathy:    No cervical adenopathy.  Neurological: Alert and oriented to person, place, and time. Exhibits normal muscle tone. Gait normal. Coordination normal.  Skin: Skin is warm and dry.  Rash noted as depicted  below.  Psychiatric: Mood, memory and judgment normal.  Vitals reviewed.  Right arm     Left arm    LABORATORY DATA: Lab Results  Component Value Date   WBC 0.7 (LL) 12/20/2016   HGB 11.6 (L) 12/20/2016   HCT 34.8 (L) 12/20/2016   MCV 91.8 12/20/2016   PLT 82 (L) 12/20/2016  ANC 0.3    Chemistry      Component Value Date/Time   NA 136 12/20/2016 0931   K 4.4 12/20/2016 0931   CL 105 10/14/2016 0623   CO2 22 12/20/2016 0931   BUN 26.3 (H) 12/20/2016 0931   CREATININE 0.9 12/20/2016 0931      Component Value Date/Time   CALCIUM 8.6 12/20/2016 0931   ALKPHOS 60 12/20/2016 0931   AST 19 12/20/2016 0931   ALT 19 12/20/2016 0931   BILITOT 0.40 12/20/2016 0931       RADIOGRAPHIC STUDIES:  No results found.   ASSESSMENT/PLAN:  Stage IV squamous cell carcinoma of left lung ([place]) This is a very pleasant 83 year old white male with metastatic non-small cell lung cancer, squamous cell carcinoma with negative PDL 1 expression and enlarging solitary brain metastasis.  The patient received stereotactic radiotherapy to his brain lesion.  He is currently receiving radiation to his lung mass.  The patient is status post 1 cycle of palliative systemic chemotherapy with carboplatin for an AUC of 5 and paclitaxel 175 mg/meter squared.  [name] was held due to ongoing radiation therapy.    Lab results were reviewed with the patient and his wife.  Explained to him that he is neutropenic we discussed neutropenic precautions.  Patient is to monitor his temperature and call us for a fever of 100.5 or higher.  Granix 480 mcg daily x3 was initiated in the office today.  We also discussed bleeding precautions due to the mild thrombocytopenia.  He was instructed to call us for any bleeding.  He will continue weekly labs.  The patient will return in about 2 weeks for evaluation prior to cycle 2 of his treatment.  For his rash to the bilateral arms, he was given a prescription for  triamcinolone cream to be applied twice a day as needed for itching and rash. The radiation induced esophagitis, a prescription for Carafate was sent to his local pharmacy. For constipation, he was instructed to use Senokot-S 1-2 tablets daily.  The patient's wife will plan to call us back with the name  of the company she would like a referral to for physical therapy.  We will consider giving him the [facility] with his second cycle of chemotherapy.    The patient was advised to call immediately if he has any concerning symptoms in the interval. The patient voices understanding of current disease status and treatment options and is in agreement with the current care plan. All questions were answered. The patient knows to call the clinic with any problems, questions or concerns. We can certainly see the patient much sooner if necessary.  No orders of the defined types were placed in this encounter.   Mikey Bussing, DNP, AGPCNP-BC, AOCNP 12/20/16

## 2016-12-20 NOTE — Telephone Encounter (Signed)
Attempting to connect with patient or his wife to inquire of status. No answer. Left voicemail message on home and mobile phone with my contact information. Awaiting return call.

## 2016-12-20 NOTE — Telephone Encounter (Signed)
Per 11/13 los - keep appts as scheduled - no additional appts added - gave patient calender

## 2016-12-20 NOTE — Telephone Encounter (Signed)
Left message for patient regarding appts added per 11/13 sch msg.

## 2016-12-20 NOTE — Patient Instructions (Signed)
Neutropenia Neutropenia is a condition that occurs when you have a lower-than-normal level of a type of white blood cell (neutrophil) in your body. Neutrophils are made in the spongy center of large bones (bone marrow) and they fight infections. Neutrophils are your body's main defense against bacterial and fungal infections. The fewer neutrophils you have and the longer your body remains without them, the greater your risk of getting a severe infection. What are the causes? This condition can occur if your body uses up or destroys neutrophils faster than your bone marrow can make them. This problem may happen because of:  Bacterial or fungal infection.  Allergic disorders.  Reactions to some medicines.  Autoimmune disease.  An enlarged spleen.  This condition can also occur if your bone marrow does not produce enough neutrophils. This problem may be caused by:  Cancer.  Cancer treatments, such as radiation or chemotherapy.  Viral infections.  Medicines, such as phenytoin.  Vitamin B12 deficiency.  Diseases of the bone marrow.  Environmental toxins, such as insecticides.  What are the signs or symptoms? This condition does not usually cause symptoms. If symptoms are present, they are usually caused by an underlying infection. Symptoms of an infection may include:  Fever.  Chills.  Swollen glands.  Oral or anal ulcers.  Cough and shortness of breath.  Rash.  Skin infection.  Fatigue.  How is this diagnosed? Your health care provider may suspect neutropenia if you have:  A condition that may cause neutropenia.  Symptoms of infection, especially fever.  Frequent and unusual infections.  You will have a medical history and physical exam. Tests will also be done, such as:  A complete blood count (CBC).  A procedure to collect a sample of bone marrow for examination (bone marrow biopsy).  A chest X-ray.  A urine culture.  A blood culture.  How is this  treated? Treatment depends on the underlying cause and severity of your condition. Mild neutropenia may not require treatment. Treatment may include medicines, such as:  Antibiotic medicine given through an IV tube.  Antiviral medicines.  Antifungal medicines.  A medicine to increase neutrophil production (colony-stimulating factor). You may get this drug through an IV tube or by injection.  Steroids given through an IV tube.  If an underlying condition is causing neutropenia, you may need treatment for that condition. If medicines you are taking are causing neutropenia, your health care provider may have you stop taking those medicines. Follow these instructions at home: Medicines  Take over-the-counter and prescription medicines only as told by your health care provider.  Get a seasonal flu shot (influenza vaccine). Lifestyle  Do not eat unpasteurized foods.Do not eat unwashed raw fruits or vegetables.  Avoid exposure to groups of people or children.  Avoid being around people who are sick.  Avoid being around dirt or dust, such as in construction areas or gardens.  Do not provide direct care for pets. Avoid animal droppings. Do not clean litter boxes and bird cages. Hygiene   Bathe daily.  Clean the area between the genitals and the anus (perineal area) after you urinate or have a bowel movement. If you are male, wipe from front to back.  Brush your teeth with a soft toothbrush before and after meals.  Do not use a razor that has a blade. Use an electric razor to remove hair.  Wash your hands often. Make sure others who come in contact with you also wash their hands. If soap and water  are not available, use hand sanitizer. General instructions  Do not have sex unless your health care provider has approved.  Take actions to avoid cuts and burns. For example: ? Be cautious when you use knives. Always cut away from yourself. ? Keep knives in protective sheaths or  guards when not in use. ? Use oven mitts when you cook with a hot stove, oven, or grill. ? Stand a safe distance away from open fires.  Avoid people who received a vaccine in the past 30 days if that vaccine contained a live version of the germ (live vaccine). You should not get a live vaccine. Common live vaccines are varicella, measles, mumps, and rubella.  Do not share food utensils.  Do not use tampons, enemas, or rectal suppositories unless your health care provider has approved.  Keep all appointments as told by your health care provider. This is important. Contact a health care provider if:  You have a fever.  You have chills or you start to shake.  You have: ? A sore throat. ? A warm, red, or tender area on your skin. ? A cough. ? Frequent or painful urination. ? Vaginal discharge or itching.  You develop: ? Sores in your mouth or anus. ? Swollen lymph nodes. ? Red streaks on the skin. ? A rash.  You feel: ? Nauseous or you vomit. ? Very fatigued. ? Short of breath. This information is not intended to replace advice given to you by your health care provider. Make sure you discuss any questions you have with your health care provider. Document Released: 07/16/2001 Document Revised: 07/02/2015 Document Reviewed: 08/06/2014 Elsevier Interactive Patient Education  2018 [employer]  

## 2016-12-20 NOTE — Assessment & Plan Note (Addendum)
This is a very pleasant 83 year old white male with metastatic non-small cell lung cancer, squamous cell carcinoma with negative PDL 1 expression and enlarging solitary brain metastasis.  The patient received stereotactic radiotherapy to his brain lesion.  He is currently receiving radiation to his lung mass.  The patient is status post 1 cycle of palliative systemic chemotherapy with carboplatin for an AUC of 5 and paclitaxel 175 mg/meter squared.  [name] was held due to ongoing radiation therapy.    Lab results were reviewed with the patient and his wife.  Explained to him that he is neutropenic we discussed neutropenic precautions.  Patient is to monitor his temperature and call us for a fever of 100.5 or higher.  Granix 480 mcg daily x3 was initiated in the office today.  We also discussed bleeding precautions due to the mild thrombocytopenia.  He was instructed to call us for any bleeding.  He will continue weekly labs.  The patient will return in about 2 weeks for evaluation prior to cycle 2 of his treatment.  For his rash to the bilateral arms, he was given a prescription for triamcinolone cream to be applied twice a day as needed for itching and rash. The radiation induced esophagitis, a prescription for Carafate was sent to his local pharmacy. For constipation, he was instructed to use Senokot-S 1-2 tablets daily.  The patient's wife will plan to call us back with the name of the company she would like a referral to for physical therapy.  We will consider giving him the [facility] with his second cycle of chemotherapy.    The patient was advised to call immediately if he has any concerning symptoms in the interval. The patient voices understanding of current disease status and treatment options and is in agreement with the current care plan. All questions were answered. The patient knows to call the clinic with any problems, questions or concerns. We can certainly see the patient much  sooner if necessary.

## 2016-12-21 ENCOUNTER — Emergency Department (HOSPITAL_COMMUNITY): Payer: Medicare Other

## 2016-12-21 ENCOUNTER — Ambulatory Visit (HOSPITAL_BASED_OUTPATIENT_CLINIC_OR_DEPARTMENT_OTHER): Payer: Medicare Other

## 2016-12-21 ENCOUNTER — Encounter (HOSPITAL_COMMUNITY): Payer: Self-pay | Admitting: Emergency Medicine

## 2016-12-21 ENCOUNTER — Ambulatory Visit
Admission: RE | Admit: 2016-12-21 | Discharge: 2016-12-21 | Disposition: A | Discharge status: Home / Self Care | Payer: Medicare Other | Source: Ambulatory Visit | Attending: Radiation Oncology | Admitting: Radiation Oncology

## 2016-12-21 ENCOUNTER — Other Ambulatory Visit: Payer: Self-pay

## 2016-12-21 ENCOUNTER — Inpatient Hospital Stay (HOSPITAL_COMMUNITY)
Admission: EM | Admit: 2016-12-21 | Discharge: 2016-12-26 | DRG: 193 | Disposition: A | Discharge status: Home Health | Payer: Medicare Other | Attending: Internal Medicine | Admitting: Internal Medicine

## 2016-12-21 VITALS — BP 107/51 | HR 56 | Temp 97.8°F | Resp 18

## 2016-12-21 DIAGNOSIS — C3492 Malignant neoplasm of unspecified part of left bronchus or lung: Secondary | ICD-10-CM | POA: Diagnosis not present

## 2016-12-21 DIAGNOSIS — J90 Pleural effusion, not elsewhere classified: Secondary | ICD-10-CM | POA: Diagnosis present

## 2016-12-21 DIAGNOSIS — J181 Lobar pneumonia, unspecified organism: Principal | ICD-10-CM

## 2016-12-21 DIAGNOSIS — Z95828 Presence of other vascular implants and grafts: Secondary | ICD-10-CM

## 2016-12-21 DIAGNOSIS — E785 Hyperlipidemia, unspecified: Secondary | ICD-10-CM | POA: Diagnosis present

## 2016-12-21 DIAGNOSIS — Z9889 Other specified postprocedural states: Secondary | ICD-10-CM

## 2016-12-21 DIAGNOSIS — J440 Chronic obstructive pulmonary disease with acute lower respiratory infection: Secondary | ICD-10-CM | POA: Diagnosis present

## 2016-12-21 DIAGNOSIS — I252 Old myocardial infarction: Secondary | ICD-10-CM

## 2016-12-21 DIAGNOSIS — C7931 Secondary malignant neoplasm of brain: Secondary | ICD-10-CM

## 2016-12-21 DIAGNOSIS — J439 Emphysema, unspecified: Secondary | ICD-10-CM | POA: Diagnosis present

## 2016-12-21 DIAGNOSIS — H3530 Unspecified macular degeneration: Secondary | ICD-10-CM | POA: Diagnosis present

## 2016-12-21 DIAGNOSIS — E1142 Type 2 diabetes mellitus with diabetic polyneuropathy: Secondary | ICD-10-CM | POA: Diagnosis present

## 2016-12-21 DIAGNOSIS — Z806 Family history of leukemia: Secondary | ICD-10-CM

## 2016-12-21 DIAGNOSIS — I10 Essential (primary) hypertension: Secondary | ICD-10-CM | POA: Diagnosis present

## 2016-12-21 DIAGNOSIS — M1990 Unspecified osteoarthritis, unspecified site: Secondary | ICD-10-CM | POA: Diagnosis present

## 2016-12-21 DIAGNOSIS — R0902 Hypoxemia: Secondary | ICD-10-CM

## 2016-12-21 DIAGNOSIS — Z8701 Personal history of pneumonia (recurrent): Secondary | ICD-10-CM

## 2016-12-21 DIAGNOSIS — Z807 Family history of other malignant neoplasms of lymphoid, hematopoietic and related tissues: Secondary | ICD-10-CM

## 2016-12-21 DIAGNOSIS — D61810 Antineoplastic chemotherapy induced pancytopenia: Secondary | ICD-10-CM | POA: Diagnosis present

## 2016-12-21 DIAGNOSIS — Z85828 Personal history of other malignant neoplasm of skin: Secondary | ICD-10-CM

## 2016-12-21 DIAGNOSIS — K219 Gastro-esophageal reflux disease without esophagitis: Secondary | ICD-10-CM | POA: Diagnosis present

## 2016-12-21 DIAGNOSIS — J910 Malignant pleural effusion: Secondary | ICD-10-CM | POA: Diagnosis present

## 2016-12-21 DIAGNOSIS — D701 Agranulocytosis secondary to cancer chemotherapy: Secondary | ICD-10-CM

## 2016-12-21 DIAGNOSIS — Z801 Family history of malignant neoplasm of trachea, bronchus and lung: Secondary | ICD-10-CM

## 2016-12-21 DIAGNOSIS — Z881 Allergy status to other antibiotic agents status: Secondary | ICD-10-CM

## 2016-12-21 DIAGNOSIS — K5900 Constipation, unspecified: Secondary | ICD-10-CM | POA: Diagnosis present

## 2016-12-21 DIAGNOSIS — Z87891 Personal history of nicotine dependence: Secondary | ICD-10-CM

## 2016-12-21 DIAGNOSIS — R531 Weakness: Secondary | ICD-10-CM

## 2016-12-21 DIAGNOSIS — Z951 Presence of aortocoronary bypass graft: Secondary | ICD-10-CM

## 2016-12-21 DIAGNOSIS — Z79899 Other long term (current) drug therapy: Secondary | ICD-10-CM

## 2016-12-21 DIAGNOSIS — R5081 Fever presenting with conditions classified elsewhere: Secondary | ICD-10-CM

## 2016-12-21 DIAGNOSIS — D61818 Other pancytopenia: Secondary | ICD-10-CM | POA: Diagnosis present

## 2016-12-21 DIAGNOSIS — I2510 Atherosclerotic heart disease of native coronary artery without angina pectoris: Secondary | ICD-10-CM | POA: Diagnosis present

## 2016-12-21 DIAGNOSIS — Z794 Long term (current) use of insulin: Secondary | ICD-10-CM

## 2016-12-21 DIAGNOSIS — T451X5A Adverse effect of antineoplastic and immunosuppressive drugs, initial encounter: Secondary | ICD-10-CM

## 2016-12-21 DIAGNOSIS — J189 Pneumonia, unspecified organism: Secondary | ICD-10-CM

## 2016-12-21 DIAGNOSIS — D709 Neutropenia, unspecified: Secondary | ICD-10-CM | POA: Diagnosis present

## 2016-12-21 DIAGNOSIS — Z923 Personal history of irradiation: Secondary | ICD-10-CM

## 2016-12-21 DIAGNOSIS — Z803 Family history of malignant neoplasm of breast: Secondary | ICD-10-CM

## 2016-12-21 DIAGNOSIS — R7881 Bacteremia: Secondary | ICD-10-CM | POA: Diagnosis present

## 2016-12-21 LAB — CBC WITH DIFFERENTIAL/PLATELET
BASOS PCT: 0 %
Basophils Absolute: 0 10*3/uL (ref 0.0–0.1)
EOS ABS: 0 10*3/uL (ref 0.0–0.7)
Eosinophils Relative: 3 %
HCT: 34.6 % — ABNORMAL LOW (ref 39.0–52.0)
HEMOGLOBIN: 11.8 g/dL — AB (ref 13.0–17.0)
LYMPHS PCT: 23 %
Lymphs Abs: 0.2 10*3/uL — ABNORMAL LOW (ref 0.7–4.0)
MCH: 30.9 pg (ref 26.0–34.0)
MCHC: 34.1 g/dL (ref 30.0–36.0)
MCV: 90.6 fL (ref 78.0–100.0)
MONOS PCT: 49 %
Monocytes Absolute: 0.4 10*3/uL (ref 0.1–1.0)
NEUTROS ABS: 0.2 10*3/uL — AB (ref 1.7–7.7)
Neutrophils Relative %: 25 %
Platelets: 85 10*3/uL — ABNORMAL LOW (ref 150–400)
RBC: 3.82 MIL/uL — ABNORMAL LOW (ref 4.22–5.81)
RDW: 14.1 % (ref 11.5–15.5)
WBC: 0.8 10*3/uL — CL (ref 4.0–10.5)

## 2016-12-21 LAB — COMPREHENSIVE METABOLIC PANEL
ALBUMIN: 2.9 g/dL — AB (ref 3.5–5.0)
ALT: 19 U/L (ref 17–63)
AST: 24 U/L (ref 15–41)
Alkaline Phosphatase: 62 U/L (ref 38–126)
Anion gap: 9 (ref 5–15)
BILIRUBIN TOTAL: 0.7 mg/dL (ref 0.3–1.2)
BUN: 24 mg/dL — AB (ref 6–20)
CO2: 23 mmol/L (ref 22–32)
Calcium: 8.7 mg/dL — ABNORMAL LOW (ref 8.9–10.3)
Chloride: 102 mmol/L (ref 101–111)
Creatinine, Ser: 1.15 mg/dL (ref 0.61–1.24)
GFR calc Af Amer: >60 mL/min (ref >60)
GFR calc non Af Amer: 57 mL/min — ABNORMAL LOW (ref >60)
GLUCOSE: 183 mg/dL — AB (ref 65–99)
POTASSIUM: 4 mmol/L (ref 3.5–5.1)
Sodium: 134 mmol/L — ABNORMAL LOW (ref 135–145)
TOTAL PROTEIN: 6.2 g/dL — AB (ref 6.5–8.1)

## 2016-12-21 LAB — PROCALCITONIN: Procalcitonin: 0.21 ng/mL

## 2016-12-21 LAB — PROTIME-INR
INR: 1.05
Prothrombin Time: 13.6 seconds (ref 11.4–15.2)

## 2016-12-21 LAB — I-STAT CG4 LACTIC ACID, ED: Lactic Acid, Venous: 1.49 mmol/L (ref 0.5–1.9)

## 2016-12-21 MED ORDER — SODIUM CHLORIDE 0.9 % IV BOLUS (SEPSIS)
500.0000 mL | Freq: Once | INTRAVENOUS | Status: AC
  Administered 2016-12-22: 500 mL via INTRAVENOUS

## 2016-12-21 MED ORDER — SODIUM CHLORIDE 0.9 % IV BOLUS (SEPSIS)
1000.0000 mL | Freq: Once | INTRAVENOUS | Status: AC
  Administered 2016-12-21: 1000 mL via INTRAVENOUS

## 2016-12-21 MED ORDER — VANCOMYCIN HCL IN DEXTROSE 1-5 GM/200ML-% IV SOLN
1000.0000 mg | Freq: Once | INTRAVENOUS | Status: AC
  Administered 2016-12-21: 1000 mg via INTRAVENOUS
  Filled 2016-12-21: qty 200

## 2016-12-21 MED ORDER — PIPERACILLIN-TAZOBACTAM 3.375 G IVPB 30 MIN
3.3750 g | Freq: Once | INTRAVENOUS | Status: AC
  Administered 2016-12-21: 3.375 g via INTRAVENOUS
  Filled 2016-12-21: qty 50

## 2016-12-21 MED ORDER — TBO-FILGRASTIM 480 MCG/0.8ML ~~LOC~~ SOSY
480.0000 ug | PREFILLED_SYRINGE | Freq: Once | SUBCUTANEOUS | Status: AC
  Administered 2016-12-21: 480 ug via SUBCUTANEOUS
  Filled 2016-12-21: qty 0.8

## 2016-12-21 NOTE — Patient Instructions (Signed)
Tbo-Filgrastim injection What is this medicine? TBO-FILGRASTIM (T B O fil GRA stim) is a granulocyte colony-stimulating factor that stimulates the growth of neutrophils, a type of white blood cell important in the body's fight against infection. It is used to reduce the incidence of fever and infection in patients with certain types of cancer who are receiving chemotherapy that affects the bone marrow. This medicine may be used for other purposes; ask your health care provider or pharmacist if you have questions. COMMON BRAND NAME(S): Granix What should I tell my health care provider before I take this medicine? They need to know if you have any of these conditions: -bone scan or tests planned -kidney disease -sickle cell anemia -an unusual or allergic reaction to tbo-filgrastim, filgrastim, pegfilgrastim, other medicines, foods, dyes, or preservatives -pregnant or trying to get pregnant -breast-feeding How should I use this medicine? This medicine is for injection under the skin. If you get this medicine at home, you will be taught how to prepare and give this medicine. Refer to the Instructions for Use that come with your medication packaging. Use exactly as directed. Take your medicine at regular intervals. Do not take your medicine more often than directed. It is important that you put your used needles and syringes in a special sharps container. Do not put them in a trash can. If you do not have a sharps container, call your pharmacist or healthcare provider to get one. Talk to your pediatrician regarding the use of this medicine in children. Special care may be needed. Overdosage: If you think you have taken too much of this medicine contact a poison control center or emergency room at once. NOTE: This medicine is only for you. Do not share this medicine with others. What if I miss a dose? It is important not to miss your dose. Call your doctor or health care professional if you miss a  dose. What may interact with this medicine? This medicine may interact with the following medications: -medicines that may cause a release of neutrophils, such as lithium This list may not describe all possible interactions. Give your health care provider a list of all the medicines, herbs, non-prescription drugs, or dietary supplements you use. Also tell them if you smoke, drink alcohol, or use illegal drugs. Some items may interact with your medicine. What should I watch for while using this medicine? You may need blood work done while you are taking this medicine. What side effects may I notice from receiving this medicine? Side effects that you should report to your doctor or health care professional as soon as possible: -allergic reactions like skin rash, itching or hives, swelling of the face, lips, or tongue -blood in the urine -dark urine -dizziness -fast heartbeat -feeling faint -shortness of breath or breathing problems -signs and symptoms of infection like fever or chills; cough; or sore throat -signs and symptoms of kidney injury like trouble passing urine or change in the amount of urine -stomach or side pain, or pain at the shoulder -sweating -swelling of the legs, ankles, or abdomen -tiredness Side effects that usually do not require medical attention (report to your doctor or health care professional if they continue or are bothersome): -bone pain -headache -muscle pain -vomiting This list may not describe all possible side effects. Call your doctor for medical advice about side effects. You may report side effects to FDA at 1-800-FDA-1088. Where should I keep my medicine? Keep out of the reach of children. Store in a refrigerator between   2 and 8 degrees C (36 and 46 degrees F). Keep in carton to protect from light. Throw away this medicine if it is left out of the refrigerator for more than 5 consecutive days. Throw away any unused medicine after the expiration  date. NOTE: This sheet is a summary. It may not cover all possible information. If you have questions about this medicine, talk to your doctor, pharmacist, or health care provider.  2018 Elsevier/Gold Standard (2015-03-16 19:07:04)  

## 2016-12-21 NOTE — ED Triage Notes (Signed)
Pt brought in by wife  Pt is a cancer pt and started running a temperature today  Wife states he has been c/o sore throat and has decreased oral intake since yesterday  Pt is pale and weak in triage

## 2016-12-21 NOTE — ED Provider Notes (Signed)
[facility] DEPT Provider Note   CSN: [id number] Arrival date & time: 12/21/16  2211     History   Chief Complaint Chief Complaint  Patient presents with  . Fever  . hypotensive    HPI Steve Frank is a 83 y.o. male.  HPI 83 year old white male with metastatic non-small cell lung cancer, squamous cell carcinoma with  brain metastasis s/p stereotactic radiotherapy to his brain lesion, radiation to lung mass, and  1 cycle of palliative systemic chemotherapy, last 10 days ago. Also history of CAD, HTN, HLD, DM. Over past 2 days has been feeling weak and fatigued with cough and minimal sputum. Fever 100.17F PTA. No dyspnea, chest pain, dysuria, frequency of urine, abdominal pain, headaches, confusion, n/v/d. His wife states he needed shots to bring up his WBCs recently.    Past Medical History:  Diagnosis Date  . Arthritis   . BPH (benign prostatic hyperplasia)   . CAD (coronary artery disease)    s/p 2 cabg's  . Cancer (HCC)    Basal and squamous cell  skin cancer  . Cerebellar mass   . Chest mass   . Diabetes ([place])    Type II  . GERD (gastroesophageal reflux disease)   . Head injury with loss of consciousness [facility])    age 24 or 8-   . Heart attack ([place])   . HLD (hyperlipidemia)   . Hypertension   . Macular degeneration   . Pneumonia     x 2 last 1071  . Stage IV squamous cell carcinoma of left lung ([place]) 10/20/2016    Patient Active Problem List   Diagnosis Date Noted  . Neutropenia ([place]) 12/20/2016  . Thrombocytopenia ([place]) 12/20/2016  . Dermatitis 12/20/2016  . Esophagitis 12/20/2016  . Constipation 12/20/2016  . Port-A-Cath in place 12/12/2016  . Goals of care, counseling/discussion 10/22/2016  . Encounter for antineoplastic chemotherapy 10/22/2016  . Stage IV squamous cell carcinoma of left lung ([place]) 10/20/2016  . Generalized weakness 09/09/2016  . Hyponatremia 09/09/2016  . Acute encephalopathy 09/05/2016  . BPH (benign  prostatic hyperplasia) 09/04/2016  . Lung mass 08/08/2016  . Oropharyngeal dysphagia 08/08/2016  . Solitary left cerebellar brain metastasis ([place]) 08/04/2016  . Diabetes mellitus ([place]) 08/04/2016  . Essential hypertension 08/04/2016  . HLD (hyperlipidemia) 08/04/2016  . Loss of coordination 08/03/2016  . Atherosclerosis of native coronary artery of native heart 05/31/2015  . Diabetic polyneuropathy associated with type 2 diabetes mellitus ([place]) 05/31/2015  . Emphysema lung ([place]) 05/31/2015  . GERD (gastroesophageal reflux disease) 05/31/2015    Past Surgical History:  Procedure Laterality Date  . CARDIAC CATHETERIZATION    . COLONOSCOPY W/ POLYPECTOMY    . CORONARY ARTERY BYPASS GRAFT     Dr Prescott Gum [facility] 07/2007  . CORONARY ARTERY BYPASS GRAFT     1st one was @ [facility] 26years ago 12/1991  . CRANIECTOMY FOR EXCISION OF BRAIN TUMOR INFRATENTORIAL / POSTERIOR FOSSA  08/05/2016   Dr Rebecka Apley  @ [facility]/Neurosurgery  . FLEXIBLE BRONCHOSCOPY  08/07/2016   Dr Gwenevere Ghazi  . IR FLUORO GUIDE PORT INSERTION RIGHT  11/18/2016  . IR US GUIDE VASC ACCESS RIGHT  11/18/2016       Home Medications    Prior to Admission medications   Medication Sig Start Date End Date Taking? Authorizing Provider  acetaminophen (TYLENOL) 325 MG tablet Take 650 mg by mouth every 6 (six) hours as needed (for pain/headaches.).  08/11/16  Yes [provider]  albuterol (PROVENTIL HFA;VENTOLIN HFA) 108 (90 Base) MCG/ACT inhaler Inhale 2 puffs into the lungs every 6 (six) hours as needed (for wheezing/shortness of breath).  08/23/16  Yes [provider]  amLODipine (NORVASC) 5 MG tablet Take 5 mg by mouth daily.  08/24/16 08/24/17 Yes [provider]  carvedilol (COREG) 25 MG tablet Take 25 mg by mouth 2 (two) times daily with a meal.  08/03/16  Yes [provider]  Cholecalciferol (VITAMIN D-1000 MAX ST) 1000 units tablet Take 1,000 Units by mouth daily.    Yes [provider]  clotrimazole-betamethasone (LOTRISONE) cream Apply 1 application topically 2 (two) times daily.   Yes [provider]  docusate sodium (COLACE) 100 MG capsule Take 100 mg by mouth 2 (two) times daily as needed (for constipation.).   Yes [provider]  gabapentin (NEURONTIN) 400 MG capsule Take 400 mg by mouth at bedtime.   Yes [provider]  guaiFENesin-dextromethorphan (ROBITUSSIN DM) 100-10 MG/5ML syrup Take 15 mLs every 4 (four) hours as needed by mouth for cough.   Yes [provider]  HUMALOG 100 UNIT/ML injection INJECT 2 TO 0.12MLS (2-8 UNITS) UNDER THE SKIN 3 TIMES A DAY BEFORE MEALS, AS PER SLIDING SCALE 08/27/16  Yes [provider]  LANTUS 100 UNIT/ML injection INJECT 0.15ML (15 UNITS TOTAL) UNDER THE SKIN DAILY AT BEDTIME. 08/23/16  Yes [provider]  lidocaine-prilocaine (EMLA) cream Apply 1 application topically as needed. 11/09/16  Yes Curt Bears, MD  magnesium hydroxide (MILK OF MAGNESIA) 400 MG/5ML suspension Take 30 mLs by mouth daily as needed (for constipation.).   Yes [provider]  mirtazapine (REMERON) 15 MG tablet Take 15 mg by mouth at bedtime. 08/23/16  Yes [provider]  Multiple Vitamin (MULTIVITAMIN WITH MINERALS) TABS tablet Take 1 tablet by mouth daily.   Yes [provider]  neomycin-bacitracin-polymyxin (NEOSPORIN) [phone] ointment Apply 1 application topically at bedtime.   Yes [provider]  omeprazole (PRILOSEC) 20 MG capsule Take 20 mg by mouth at bedtime.  07/25/16  Yes [provider]  polyethylene glycol (MIRALAX / GLYCOLAX) packet Take 17 g by mouth daily as needed (for constipation.).    Yes [provider]  prochlorperazine (COMPAZINE) 10 MG tablet Take 1 tablet (10 mg total) by mouth every 6 (six) hours as needed for nausea or vomiting. 11/09/16  Yes Curt Bears, MD  rosuvastatin (CRESTOR) 20 MG tablet Take 20 mg by  mouth daily.   Yes [provider]  sucralfate (CARAFATE) 1 GM/10ML suspension Take 10 mLs (1 g total) 4 (four) times daily -  with meals and at bedtime by mouth. 12/20/16  Yes Curcio, Roselie Awkward, NP  B-D INS SYR ULTRAFINE 1CC/31G 31G X 5/16" 1 ML MISC  10/12/16   [provider]  triamcinolone (KENALOG) 0.025 % ointment Apply 1 application 2 (two) times daily topically. 12/20/16   Maryanna Shape, NP    Family History Family History  Problem Relation Age of Onset  . Cancer Brother        lung to head and neck involving lymph nodes  . Cancer Paternal Aunt        breast  . Cancer Paternal Uncle        leukemia  . Cancer Paternal Aunt        breast  . Cancer Paternal Uncle        lung    Social History Social History   Tobacco Use  .  Smoking status: Former Smoker    Packs/day: 2.00    Years: 35.00    Pack years: 70.00    Types: Cigarettes    Last attempt to quit: 02/07/1985    Years since quitting: 31.8  . Smokeless tobacco: Former Systems developer  . Tobacco comment: 10/13/16- Quit 31 years ago  Substance Use Topics  . Alcohol use: No  . Drug use: No     Allergies   Ciprofloxacin   Review of Systems Review of Systems  Constitutional: Positive for fatigue and fever.  Respiratory: Positive for cough.   Cardiovascular: Negative for chest pain.  Gastrointestinal: Negative for abdominal pain and vomiting.  Genitourinary: Negative for dysuria.  Neurological: Negative for headaches.  All other systems reviewed and are negative.    Physical Exam Updated Vital Signs BP (!) 108/55   Pulse 74   Temp (!) 100.6 F (38.1 C) (Rectal)   Resp 19   SpO2 95%   Physical Exam Physical Exam  Nursing note and vitals reviewed. Constitutional: Well developed, well nourished, non-toxic, and in no acute distress Head: Normocephalic and atraumatic.  Mouth/Throat: Oropharynx is clear and moist.  Neck: Normal range of motion. Neck supple.  Cardiovascular: Normal rate and  regular rhythm.   Pulmonary/Chest: Effort normal and breath sounds coarse bilaterally on anterior ausculation.  Abdominal: Soft. There is no tenderness. There is no rebound and no guarding.  Musculoskeletal: Normal range of motion.  Neurological: Alert, no facial droop, fluent speech, moves all extremities symmetrically Skin: Skin is cool to touch and dry.  Psychiatric: Cooperative   ED Treatments / Results  Labs (all labs ordered are listed, but only abnormal results are displayed) Labs Reviewed  COMPREHENSIVE METABOLIC PANEL - Abnormal; Notable for the following components:      Result Value   Sodium 134 (*)    Glucose, Bld 183 (*)    BUN 24 (*)    Calcium 8.7 (*)    Total Protein 6.2 (*)    Albumin 2.9 (*)    GFR calc non Af Amer 57 (*)    All other components within normal limits  CBC WITH DIFFERENTIAL/PLATELET - Abnormal; Notable for the following components:   WBC 0.8 (*)    RBC 3.82 (*)    Hemoglobin 11.8 (*)    HCT 34.6 (*)    Platelets 85 (*)    Neutro Abs 0.2 (*)    Lymphs Abs 0.2 (*)    All other components within normal limits  CULTURE, BLOOD (ROUTINE X 2)  CULTURE, BLOOD (ROUTINE X 2)  URINE CULTURE  PROTIME-INR  PROCALCITONIN  URINALYSIS, ROUTINE W REFLEX MICROSCOPIC  I-STAT CG4 LACTIC ACID, ED  I-STAT CG4 LACTIC ACID, ED  I-STAT CG4 LACTIC ACID, ED    EKG  EKG Interpretation None       Radiology Dg Chest Portable 1 View  Result Date: 12/21/2016 CLINICAL DATA:  Hypotension fever and sepsis EXAM: PORTABLE CHEST 1 VIEW COMPARISON:  10/14/2016, PET-CT 09/20/2016 FINDINGS: Right-sided central venous port tip overlies the SVC. Post sternotomy changes. Moderate left pleural effusion. Dense consolidation at the lingula and left base. Enlarged cardiomediastinal silhouette. Aortic atherosclerosis. No pneumothorax. IMPRESSION: Moderate left pleural effusion with consolidation at the lingula and left lung base. Diffuse hazy opacity on the left may relate to  layering effusion. Cardiomegaly Electronically Signed   By: Donavan Foil M.D.   On: 12/21/2016 23:21    Procedures Procedures (including critical care time) CRITICAL CARE Performed by: Forde Dandy  Total critical care time: 35 minutes  Critical care time was exclusive of separately billable procedures and treating other patients.  Critical care was necessary to treat or prevent imminent or life-threatening deterioration.  Critical care was time spent personally by me on the following activities: development of treatment plan with patient and/or surrogate as well as nursing, discussions with consultants, evaluation of patient's response to treatment, examination of patient, obtaining history from patient or surrogate, ordering and performing treatments and interventions, ordering and review of laboratory studies, ordering and review of radiographic studies, pulse oximetry and re-evaluation of patient's condition.  Medications Ordered in ED Medications  sodium chloride 0.9 % bolus 1,000 mL (0 mLs Intravenous Stopped 12/22/16 0000)    And  sodium chloride 0.9 % bolus 1,000 mL (0 mLs Intravenous Stopped 12/21/16 2359)    And  sodium chloride 0.9 % bolus 500 mL (not administered)  vancomycin (VANCOCIN) IVPB 1000 mg/200 mL premix (1,000 mg Intravenous New Bag/Given 12/21/16 2322)  piperacillin-tazobactam (ZOSYN) IVPB 3.375 g (0 g Intravenous Stopped 12/21/16 2321)     Initial Impression / Assessment and Plan / ED Course  I have reviewed the triage vital signs and the nursing notes.  Pertinent labs & imaging results that were available during my care of the patient were reviewed by me and considered in my medical decision making (see chart for details).     83 year old on chemotherapy who presents with fever.  In triage was noted to have multiple documented hypotensive blood pressures, 50s-60s over 40s.  Code sepsis was activated.  On my evaluation, he is alert, mentating normally,  and not toxic in appearance.  Subsequent blood pressures have been normal even prior to fluid resuscitation.  Sepsis workup was still pursued given that he does have a rectal temperature of 100.6.  He is also neutropenic with an [place] of 200.  He is empirically covered with vancomycin and Zosyn and started on IV fluids per sepsis protocol.  Workup does suggest potential pneumonia with left-sided infiltrate and effusion.  He is not hypoxic or in respiratory distress. Lactic acid normal. UA pending.   Plan for admission for pneumonia in setting of fever and neutropenia. Discussed with Dr. Shanon Brow, who will admit.   Final Clinical Impressions(s) / ED Diagnoses   Final diagnoses:  Chemotherapy-induced neutropenia ([place])  Lobar pneumonia [facility])    ED Discharge Orders    None       Forde Dandy, MD 12/22/16 [phone]

## 2016-12-21 NOTE — Progress Notes (Signed)
A consult was received from an ED physician for zosyn and vancomycin per pharmacy dosing.  The patient's profile has been reviewed for ht/wt/allergies/indication/available labs.   A one time order has been placed for zosyn 3.375 gm and vancomycin 1Gm.  Further antibiotics/pharmacy consults should be ordered by admitting physician if indicated.                       Thank you, Dorrene German 12/21/2016  10:55 PM

## 2016-12-21 NOTE — ED Notes (Signed)
Notified Dr Oleta Mouse of pt's blood pressure

## 2016-12-22 ENCOUNTER — Other Ambulatory Visit: Payer: Self-pay | Admitting: Radiology

## 2016-12-22 ENCOUNTER — Ambulatory Visit: Payer: Medicare Other

## 2016-12-22 ENCOUNTER — Other Ambulatory Visit: Payer: Self-pay

## 2016-12-22 ENCOUNTER — Inpatient Hospital Stay (HOSPITAL_COMMUNITY): Payer: Medicare Other

## 2016-12-22 DIAGNOSIS — H3530 Unspecified macular degeneration: Secondary | ICD-10-CM | POA: Diagnosis present

## 2016-12-22 DIAGNOSIS — I5030 Unspecified diastolic (congestive) heart failure: Secondary | ICD-10-CM | POA: Diagnosis not present

## 2016-12-22 DIAGNOSIS — D61818 Other pancytopenia: Secondary | ICD-10-CM | POA: Diagnosis not present

## 2016-12-22 DIAGNOSIS — D61810 Antineoplastic chemotherapy induced pancytopenia: Secondary | ICD-10-CM | POA: Diagnosis present

## 2016-12-22 DIAGNOSIS — J181 Lobar pneumonia, unspecified organism: Secondary | ICD-10-CM | POA: Diagnosis present

## 2016-12-22 DIAGNOSIS — T451X5A Adverse effect of antineoplastic and immunosuppressive drugs, initial encounter: Secondary | ICD-10-CM | POA: Diagnosis present

## 2016-12-22 DIAGNOSIS — Z95828 Presence of other vascular implants and grafts: Secondary | ICD-10-CM | POA: Diagnosis not present

## 2016-12-22 DIAGNOSIS — D709 Neutropenia, unspecified: Secondary | ICD-10-CM | POA: Diagnosis present

## 2016-12-22 DIAGNOSIS — Z881 Allergy status to other antibiotic agents status: Secondary | ICD-10-CM | POA: Diagnosis not present

## 2016-12-22 DIAGNOSIS — J440 Chronic obstructive pulmonary disease with acute lower respiratory infection: Secondary | ICD-10-CM | POA: Diagnosis present

## 2016-12-22 DIAGNOSIS — J189 Pneumonia, unspecified organism: Secondary | ICD-10-CM

## 2016-12-22 DIAGNOSIS — Z8701 Personal history of pneumonia (recurrent): Secondary | ICD-10-CM | POA: Diagnosis not present

## 2016-12-22 DIAGNOSIS — Z87891 Personal history of nicotine dependence: Secondary | ICD-10-CM | POA: Diagnosis not present

## 2016-12-22 DIAGNOSIS — D696 Thrombocytopenia, unspecified: Secondary | ICD-10-CM

## 2016-12-22 DIAGNOSIS — C7931 Secondary malignant neoplasm of brain: Secondary | ICD-10-CM | POA: Diagnosis present

## 2016-12-22 DIAGNOSIS — C3492 Malignant neoplasm of unspecified part of left bronchus or lung: Secondary | ICD-10-CM

## 2016-12-22 DIAGNOSIS — Z9889 Other specified postprocedural states: Secondary | ICD-10-CM | POA: Diagnosis not present

## 2016-12-22 DIAGNOSIS — E1142 Type 2 diabetes mellitus with diabetic polyneuropathy: Secondary | ICD-10-CM | POA: Diagnosis present

## 2016-12-22 DIAGNOSIS — Z951 Presence of aortocoronary bypass graft: Secondary | ICD-10-CM | POA: Diagnosis not present

## 2016-12-22 DIAGNOSIS — J910 Malignant pleural effusion: Secondary | ICD-10-CM | POA: Diagnosis present

## 2016-12-22 DIAGNOSIS — K5900 Constipation, unspecified: Secondary | ICD-10-CM | POA: Diagnosis present

## 2016-12-22 DIAGNOSIS — Z85828 Personal history of other malignant neoplasm of skin: Secondary | ICD-10-CM | POA: Diagnosis not present

## 2016-12-22 DIAGNOSIS — D649 Anemia, unspecified: Secondary | ICD-10-CM | POA: Diagnosis not present

## 2016-12-22 DIAGNOSIS — E785 Hyperlipidemia, unspecified: Secondary | ICD-10-CM | POA: Diagnosis present

## 2016-12-22 DIAGNOSIS — R7881 Bacteremia: Secondary | ICD-10-CM | POA: Diagnosis not present

## 2016-12-22 DIAGNOSIS — I10 Essential (primary) hypertension: Secondary | ICD-10-CM

## 2016-12-22 DIAGNOSIS — R5081 Fever presenting with conditions classified elsewhere: Secondary | ICD-10-CM | POA: Diagnosis present

## 2016-12-22 DIAGNOSIS — I2510 Atherosclerotic heart disease of native coronary artery without angina pectoris: Secondary | ICD-10-CM | POA: Diagnosis present

## 2016-12-22 DIAGNOSIS — K219 Gastro-esophageal reflux disease without esophagitis: Secondary | ICD-10-CM

## 2016-12-22 DIAGNOSIS — I252 Old myocardial infarction: Secondary | ICD-10-CM | POA: Diagnosis not present

## 2016-12-22 DIAGNOSIS — J90 Pleural effusion, not elsewhere classified: Secondary | ICD-10-CM | POA: Diagnosis not present

## 2016-12-22 DIAGNOSIS — D701 Agranulocytosis secondary to cancer chemotherapy: Secondary | ICD-10-CM

## 2016-12-22 DIAGNOSIS — Z794 Long term (current) use of insulin: Secondary | ICD-10-CM | POA: Diagnosis not present

## 2016-12-22 DIAGNOSIS — M1990 Unspecified osteoarthritis, unspecified site: Secondary | ICD-10-CM | POA: Diagnosis present

## 2016-12-22 DIAGNOSIS — Z79899 Other long term (current) drug therapy: Secondary | ICD-10-CM | POA: Diagnosis not present

## 2016-12-22 DIAGNOSIS — J439 Emphysema, unspecified: Secondary | ICD-10-CM | POA: Diagnosis not present

## 2016-12-22 LAB — CBC WITH DIFFERENTIAL/PLATELET
BASOS ABS: 0 10*3/uL (ref 0.0–0.1)
BASOS PCT: 1 %
EOS ABS: 0 10*3/uL (ref 0.0–0.7)
Eosinophils Relative: 4 %
HCT: 30.1 % — ABNORMAL LOW (ref 39.0–52.0)
HEMOGLOBIN: 10.1 g/dL — AB (ref 13.0–17.0)
LYMPHS PCT: 18 %
Lymphs Abs: 0.2 10*3/uL — ABNORMAL LOW (ref 0.7–4.0)
MCH: 30.5 pg (ref 26.0–34.0)
MCHC: 33.6 g/dL (ref 30.0–36.0)
MCV: 90.9 fL (ref 78.0–100.0)
Monocytes Absolute: 0.6 10*3/uL (ref 0.1–1.0)
Monocytes Relative: 54 %
NEUTROS PCT: 23 %
Neutro Abs: 0.3 10*3/uL — ABNORMAL LOW (ref 1.7–7.7)
Platelets: 76 10*3/uL — ABNORMAL LOW (ref 150–400)
RBC: 3.31 MIL/uL — ABNORMAL LOW (ref 4.22–5.81)
RDW: 14.3 % (ref 11.5–15.5)
WBC: 1.1 10*3/uL — CL (ref 4.0–10.5)

## 2016-12-22 LAB — URINALYSIS, ROUTINE W REFLEX MICROSCOPIC
BILIRUBIN URINE: NEGATIVE
GLUCOSE, UA: NEGATIVE mg/dL
HGB URINE DIPSTICK: NEGATIVE
KETONES UR: 5 mg/dL — AB
LEUKOCYTES UA: NEGATIVE
Nitrite: NEGATIVE
PH: 5 (ref 5.0–8.0)
Protein, ur: 100 mg/dL — AB
SPECIFIC GRAVITY, URINE: 1.017 (ref 1.005–1.030)

## 2016-12-22 LAB — BASIC METABOLIC PANEL
ANION GAP: 6 (ref 5–15)
BUN: 22 mg/dL — ABNORMAL HIGH (ref 6–20)
CALCIUM: 7.6 mg/dL — AB (ref 8.9–10.3)
CO2: 21 mmol/L — AB (ref 22–32)
Chloride: 108 mmol/L (ref 101–111)
Creatinine, Ser: 0.86 mg/dL (ref 0.61–1.24)
Glucose, Bld: 139 mg/dL — ABNORMAL HIGH (ref 65–99)
Potassium: 3.6 mmol/L (ref 3.5–5.1)
Sodium: 135 mmol/L (ref 135–145)

## 2016-12-22 LAB — GLUCOSE, CAPILLARY
Glucose-Capillary: 173 mg/dL — ABNORMAL HIGH (ref 65–99)
Glucose-Capillary: 86 mg/dL (ref 65–99)
Glucose-Capillary: 107 mg/dL — ABNORMAL HIGH (ref 65–99)

## 2016-12-22 LAB — BODY FLUID CELL COUNT WITH DIFFERENTIAL
EOS FL: 2 %
LYMPHS FL: 66 %
Monocyte-Macrophage-Serous Fluid: 31 % — ABNORMAL LOW (ref 50–90)
NEUTROPHIL FLUID: 1 % (ref 0–25)
Total Nucleated Cell Count, Fluid: 67 cu mm (ref 0–1000)

## 2016-12-22 LAB — GLUCOSE, PLEURAL OR PERITONEAL FLUID: Glucose, Fluid: 154 mg/dL

## 2016-12-22 LAB — EXPECTORATED SPUTUM ASSESSMENT W REFEX TO RESP CULTURE

## 2016-12-22 LAB — I-STAT BETA HCG BLOOD, ED (MC, WL, AP ONLY): I-stat hCG, quantitative: <5 m[IU]/mL (ref <5)

## 2016-12-22 LAB — EXPECTORATED SPUTUM ASSESSMENT W GRAM STAIN, RFLX TO RESP C

## 2016-12-22 LAB — STREP PNEUMONIAE URINARY ANTIGEN: STREP PNEUMO URINARY ANTIGEN: NEGATIVE

## 2016-12-22 LAB — PROTEIN, PLEURAL OR PERITONEAL FLUID: Total protein, fluid: 3.4 g/dL

## 2016-12-22 LAB — LACTATE DEHYDROGENASE, PLEURAL OR PERITONEAL FLUID: LD FL: 117 U/L — AB (ref 3–23)

## 2016-12-22 MED ORDER — TBO-FILGRASTIM 480 MCG/0.8ML ~~LOC~~ SOSY
480.0000 ug | PREFILLED_SYRINGE | Freq: Every morning | SUBCUTANEOUS | Status: DC
  Administered 2016-12-23: 480 ug via SUBCUTANEOUS
  Filled 2016-12-22: qty 0.8

## 2016-12-22 MED ORDER — LIDOCAINE HCL 1 % IJ SOLN
INTRAMUSCULAR | Status: AC
  Filled 2016-12-22: qty 10

## 2016-12-22 MED ORDER — SODIUM CHLORIDE 0.9 % IV SOLN
INTRAVENOUS | Status: DC
  Administered 2016-12-22 – 2016-12-24 (×2): via INTRAVENOUS

## 2016-12-22 MED ORDER — VANCOMYCIN HCL 10 G IV SOLR
1250.0000 mg | INTRAVENOUS | Status: DC
  Administered 2016-12-22 – 2016-12-24 (×3): 1250 mg via INTRAVENOUS
  Filled 2016-12-22 (×5): qty 1250

## 2016-12-22 MED ORDER — INSULIN GLARGINE 100 UNIT/ML ~~LOC~~ SOLN
15.0000 [IU] | Freq: Every day | SUBCUTANEOUS | Status: DC
  Administered 2016-12-22 – 2016-12-24 (×3): 15 [IU] via SUBCUTANEOUS
  Filled 2016-12-22 (×5): qty 0.15

## 2016-12-22 MED ORDER — SUCRALFATE 1 GM/10ML PO SUSP
1.0000 g | Freq: Three times a day (TID) | ORAL | Status: DC
  Administered 2016-12-22 – 2016-12-26 (×17): 1 g via ORAL
  Filled 2016-12-22 (×16): qty 10

## 2016-12-22 MED ORDER — PANTOPRAZOLE SODIUM 40 MG PO TBEC
40.0000 mg | DELAYED_RELEASE_TABLET | Freq: Every day | ORAL | Status: DC
  Administered 2016-12-22 – 2016-12-25 (×4): 40 mg via ORAL
  Filled 2016-12-22 (×4): qty 1

## 2016-12-22 MED ORDER — BACITRACIN-NEOMYCIN-POLYMYXIN 400-5-5000 EX OINT
1.0000 "application " | TOPICAL_OINTMENT | Freq: Every day | CUTANEOUS | Status: DC
  Administered 2016-12-22 – 2016-12-25 (×3): 1 via TOPICAL
  Filled 2016-12-22 (×3): qty 1

## 2016-12-22 MED ORDER — SODIUM CHLORIDE 0.9 % IV SOLN: 250.0000 mL | INTRAVENOUS | Status: DC | PRN

## 2016-12-22 MED ORDER — CEFEPIME HCL 2 G IJ SOLR
2.0000 g | Freq: Three times a day (TID) | INTRAMUSCULAR | Status: DC
  Administered 2016-12-22 – 2016-12-25 (×10): 2 g via INTRAVENOUS
  Filled 2016-12-22 (×12): qty 2

## 2016-12-22 MED ORDER — ROSUVASTATIN CALCIUM 20 MG PO TABS
20.0000 mg | ORAL_TABLET | Freq: Every day | ORAL | Status: DC
  Administered 2016-12-22 – 2016-12-26 (×5): 20 mg via ORAL
  Filled 2016-12-22 (×5): qty 1

## 2016-12-22 MED ORDER — HYDROCODONE-HOMATROPINE 5-1.5 MG/5ML PO SYRP: 5.0000 mL | ORAL_SOLUTION | Freq: Four times a day (QID) | ORAL | Status: DC | PRN

## 2016-12-22 MED ORDER — MIRTAZAPINE 15 MG PO TABS
15.0000 mg | ORAL_TABLET | Freq: Every day | ORAL | Status: DC
  Administered 2016-12-22 – 2016-12-25 (×4): 15 mg via ORAL
  Filled 2016-12-22 (×4): qty 1

## 2016-12-22 MED ORDER — INSULIN ASPART 100 UNIT/ML ~~LOC~~ SOLN
0.0000 [IU] | Freq: Three times a day (TID) | SUBCUTANEOUS | Status: DC
  Administered 2016-12-22 – 2016-12-24 (×2): 3 [IU] via SUBCUTANEOUS
  Administered 2016-12-25: 2 [IU] via SUBCUTANEOUS
  Administered 2016-12-25 – 2016-12-26 (×2): 3 [IU] via SUBCUTANEOUS

## 2016-12-22 MED ORDER — SODIUM CHLORIDE 0.9% FLUSH: 3.0000 mL | INTRAVENOUS | Status: DC | PRN

## 2016-12-22 MED ORDER — SODIUM CHLORIDE 0.9% FLUSH
3.0000 mL | Freq: Two times a day (BID) | INTRAVENOUS | Status: DC
  Administered 2016-12-22 – 2016-12-25 (×4): 3 mL via INTRAVENOUS

## 2016-12-22 MED ORDER — IPRATROPIUM-ALBUTEROL 0.5-2.5 (3) MG/3ML IN SOLN
3.0000 mL | Freq: Four times a day (QID) | RESPIRATORY_TRACT | Status: DC
  Administered 2016-12-22: 3 mL via RESPIRATORY_TRACT

## 2016-12-22 MED ORDER — GABAPENTIN 400 MG PO CAPS
400.0000 mg | ORAL_CAPSULE | Freq: Every day | ORAL | Status: DC
  Administered 2016-12-22 – 2016-12-25 (×4): 400 mg via ORAL
  Filled 2016-12-22 (×4): qty 1

## 2016-12-22 MED ORDER — TRIAMCINOLONE ACETONIDE 0.025 % EX CREA
1.0000 "application " | TOPICAL_CREAM | Freq: Two times a day (BID) | CUTANEOUS | Status: DC
  Administered 2016-12-22 – 2016-12-25 (×8): 1 via TOPICAL
  Filled 2016-12-22 (×2): qty 15

## 2016-12-22 MED ORDER — INSULIN ASPART 100 UNIT/ML ~~LOC~~ SOLN: 0.0000 [IU] | Freq: Every day | SUBCUTANEOUS | Status: DC

## 2016-12-22 MED ORDER — PROCHLORPERAZINE MALEATE 10 MG PO TABS: 10.0000 mg | ORAL_TABLET | Freq: Four times a day (QID) | ORAL | Status: DC | PRN

## 2016-12-22 MED ORDER — GUAIFENESIN ER 600 MG PO TB12
1200.0000 mg | ORAL_TABLET | Freq: Two times a day (BID) | ORAL | Status: DC
  Administered 2016-12-22 – 2016-12-26 (×8): 1200 mg via ORAL
  Filled 2016-12-22 (×8): qty 2

## 2016-12-22 MED ORDER — ALBUTEROL SULFATE (2.5 MG/3ML) 0.083% IN NEBU: 3.0000 mL | INHALATION_SOLUTION | Freq: Four times a day (QID) | RESPIRATORY_TRACT | Status: DC | PRN

## 2016-12-22 NOTE — Progress Notes (Signed)
Pharmacy Antibiotic Note  Steve Frank is a 83 y.o. male with fever admitted on 12/21/2016 with pneumonia.  Pharmacy has been consulted for vancomycin dosing. WBCs=0.8  Plan: Vancomycin 1 Gm x1 then 1250 mg IV q24h for est AUC=506 Goal AUC 400-500 Rx adjusted Cefepime to 2 Gm IV q8h for neutropenic fever     Temp (24hrs), Avg:98.7 F (37.1 C), Min:97.8 F (36.6 C), Max:100.6 F (38.1 C)  Recent Labs  Lab 12/20/16 0931 12/21/16 2233 12/21/16 2244  WBC 0.7* 0.8*  --   CREATININE 0.9 1.15  --   LATICACIDVEN  --   --  1.49    Estimated Creatinine Clearance: 51.8 mL/min (by C-G formula based on SCr of 1.15 mg/dL).    Allergies  Allergen Reactions  . Ciprofloxacin Nausea And Vomiting    Antimicrobials this admission: 11/14 zosyn >> x1 ED 11/15 cefepime >> 11/14 vancomycin >>   Dose adjustments this admission:   Microbiology results:  BCx:   UCx:    Sputum:    MRSA PCR:   Thank you for allowing pharmacy to be a part of this patient's care.  Dorrene German 12/22/2016 2:24 AM

## 2016-12-22 NOTE — Procedures (Signed)
Ultrasound-guided diagnostic and therapeutic left thoracentesis performed yielding 1.5 liters of yellow fluid. No immediate complications. Follow-up chest x-ray pending. A portion of the fluid was sent to the lab for preordered studies.

## 2016-12-22 NOTE — H&P (Signed)
History and Physical    Steve Frank GNF:[id number] DOB: [birth year] DOA: 12/21/2016  PCP: [name], MD  Patient coming from:  home  Chief Complaint: fever  HPI: Steve Frank is a 83 y.o. male with medical history significant of lung cancer, htn comes in with fever , coughing for several days.  Fever was noted today by wife.  He has been having problems with low wbc with his chemo.  No n/v/d.  No urinary symptoms.  Pt found to have pna and neutropenic.  Referred for admission for both.  Initial bp in triage was very low, this has been deemed to be errors.  bp is normal.  Review of Systems: As per HPI otherwise 10 point review of systems negative.   Past Medical History:  Diagnosis Date  . Arthritis   . BPH (benign prostatic hyperplasia)   . CAD (coronary artery disease)    s/p 2 cabg's  . Cancer (HCC)    Basal and squamous cell  skin cancer  . Cerebellar mass   . Chest mass   . Diabetes ([place])    Type II  . GERD (gastroesophageal reflux disease)   . Head injury with loss of consciousness [facility])    age 87 or 51-   . Heart attack ([place])   . HLD (hyperlipidemia)   . Hypertension   . Macular degeneration   . Pneumonia     x 2 last 1071  . Stage IV squamous cell carcinoma of left lung ([place]) 10/20/2016    Past Surgical History:  Procedure Laterality Date  . CARDIAC CATHETERIZATION    . COLONOSCOPY W/ POLYPECTOMY    . CORONARY ARTERY BYPASS GRAFT     Dr Prescott Gum [facility] 07/2007  . CORONARY ARTERY BYPASS GRAFT     1st one was @ [facility] 26years ago 12/1991  . CRANIECTOMY FOR EXCISION OF BRAIN TUMOR INFRATENTORIAL / POSTERIOR FOSSA  08/05/2016   Dr Rebecka Apley  @ [facility]/Neurosurgery  . FLEXIBLE BRONCHOSCOPY  08/07/2016   Dr Gwenevere Ghazi  . IR FLUORO GUIDE PORT INSERTION RIGHT  11/18/2016  . IR US GUIDE VASC ACCESS RIGHT  11/18/2016     reports that he quit smoking about 31 years ago. His smoking use included cigarettes. He has a 70.00 pack-year smoking history. He  has quit using smokeless tobacco. He reports that he does not drink alcohol or use drugs.  Allergies  Allergen Reactions  . Ciprofloxacin Nausea And Vomiting    Family History  Problem Relation Age of Onset  . Cancer Brother        lung to head and neck involving lymph nodes  . Cancer Paternal Aunt        breast  . Cancer Paternal Uncle        leukemia  . Cancer Paternal Aunt        breast  . Cancer Paternal Uncle        lung    Prior to Admission medications   Medication Sig Start Date End Date Taking? Authorizing Provider  acetaminophen (TYLENOL) 325 MG tablet Take 650 mg by mouth every 6 (six) hours as needed (for pain/headaches.).  08/11/16  Yes [provider]  albuterol (PROVENTIL HFA;VENTOLIN HFA) 108 (90 Base) MCG/ACT inhaler Inhale 2 puffs into the lungs every 6 (six) hours as needed (for wheezing/shortness of breath).  08/23/16  Yes [provider]  amLODipine (NORVASC) 5 MG tablet Take 5 mg by mouth daily.  08/24/16 08/24/17 Yes [provider]  carvedilol (COREG) 25 MG tablet Take 25 mg by mouth 2 (two) times daily with a meal.  08/03/16  Yes [provider]  Cholecalciferol (VITAMIN D-1000 MAX ST) 1000 units tablet Take 1,000 Units by mouth daily.    Yes [provider]  clotrimazole-betamethasone (LOTRISONE) cream Apply 1 application topically 2 (two) times daily.   Yes [provider]  docusate sodium (COLACE) 100 MG capsule Take 100 mg by mouth 2 (two) times daily as needed (for constipation.).   Yes [provider]  gabapentin (NEURONTIN) 400 MG capsule Take 400 mg by mouth at bedtime.   Yes [provider]  guaiFENesin-dextromethorphan (ROBITUSSIN DM) 100-10 MG/5ML syrup Take 15 mLs every 4 (four) hours as needed by mouth for cough.   Yes [provider]  HUMALOG 100 UNIT/ML injection INJECT 2 TO 0.12MLS (2-8 UNITS) UNDER THE SKIN 3 TIMES A DAY BEFORE MEALS, AS PER SLIDING SCALE 08/27/16  Yes  [provider]  LANTUS 100 UNIT/ML injection INJECT 0.15ML (15 UNITS TOTAL) UNDER THE SKIN DAILY AT BEDTIME. 08/23/16  Yes [provider]  lidocaine-prilocaine (EMLA) cream Apply 1 application topically as needed. 11/09/16  Yes Curt Bears, MD  magnesium hydroxide (MILK OF MAGNESIA) 400 MG/5ML suspension Take 30 mLs by mouth daily as needed (for constipation.).   Yes [provider]  mirtazapine (REMERON) 15 MG tablet Take 15 mg by mouth at bedtime. 08/23/16  Yes [provider]  Multiple Vitamin (MULTIVITAMIN WITH MINERALS) TABS tablet Take 1 tablet by mouth daily.   Yes [provider]  neomycin-bacitracin-polymyxin (NEOSPORIN) [phone] ointment Apply 1 application topically at bedtime.   Yes [provider]  omeprazole (PRILOSEC) 20 MG capsule Take 20 mg by mouth at bedtime.  07/25/16  Yes [provider]  polyethylene glycol (MIRALAX / GLYCOLAX) packet Take 17 g by mouth daily as needed (for constipation.).    Yes [provider]  prochlorperazine (COMPAZINE) 10 MG tablet Take 1 tablet (10 mg total) by mouth every 6 (six) hours as needed for nausea or vomiting. 11/09/16  Yes Curt Bears, MD  rosuvastatin (CRESTOR) 20 MG tablet Take 20 mg by mouth daily.   Yes [provider]  sucralfate (CARAFATE) 1 GM/10ML suspension Take 10 mLs (1 g total) 4 (four) times daily -  with meals and at bedtime by mouth. 12/20/16  Yes Curcio, Roselie Awkward, NP  B-D INS SYR ULTRAFINE 1CC/31G 31G X 5/16" 1 ML MISC  10/12/16   [provider]  triamcinolone (KENALOG) 0.025 % ointment Apply 1 application 2 (two) times daily topically. 12/20/16   Maryanna Shape, NP    Physical Exam: Vitals:   12/21/16 2330 12/21/16 2347 12/22/16 0000 12/22/16 0021  BP: (!) 99/53 (!) 108/55 (!) 103/52 (!) 105/55  Pulse: 69 74 73 73  Resp: (!) _0 Temp:      TempSrc:      SpO2: 92% 95% 98% 95%      Constitutional: NAD, calm,  comfortable Vitals:   12/21/16 2330 12/21/16 2347 12/22/16 0000 12/22/16 0021  BP: (!) 99/53 (!) 108/55 (!) 103/52 (!) 105/55  Pulse: 69 74 73 73  Resp: (!) _1 Temp:      TempSrc:      SpO2: 92% 95% 98% 95%   Eyes: PERRL, lids and conjunctivae normal ENMT: Mucous membranes are moist. Posterior pharynx clear of any exudate or lesions.Normal dentition.  Neck: normal, supple, no masses, no thyromegaly  Respiratory: clear to auscultation bilaterally, no wheezing, no crackles. Normal respiratory effort. No accessory muscle use.  Cardiovascular: Regular rate and rhythm, no murmurs / rubs / gallops. No extremity edema. 2+ pedal pulses. No carotid bruits.  Abdomen: no tenderness, no masses palpated. No hepatosplenomegaly. Bowel sounds positive.  Musculoskeletal: no clubbing / cyanosis. No joint deformity upper and lower extremities. Good ROM, no contractures. Normal muscle tone.  Skin: no rashes, lesions, ulcers. No induration Neurologic: CN 2-12 grossly intact. Sensation intact, DTR normal. Strength 5/5 in all 4.  Psychiatric: Normal judgment and insight. Alert and oriented x 3. Normal mood.    Labs on Admission: I have personally reviewed following labs and imaging studies  CBC: Recent Labs  Lab 12/20/16 0931 12/21/16 2233  WBC 0.7* 0.8*  NEUTROABS 0.3* 0.2*  HGB 11.6* 11.8*  HCT 34.8* 34.6*  MCV 91.8 90.6  PLT 82* 85*   Basic Metabolic Panel: Recent Labs  Lab 12/20/16 0931 12/21/16 2233  NA 136 134*  K 4.4 4.0  CL  --  102  CO2 22 23  GLUCOSE 158* 183*  BUN 26.3* 24*  CREATININE 0.9 1.15  CALCIUM 8.6 8.7*   GFR: Estimated Creatinine Clearance: 51.8 mL/min (by C-G formula based on SCr of 1.15 mg/dL). Liver Function Tests: Recent Labs  Lab 12/20/16 0931 12/21/16 2233  AST 19 24  ALT 19 19  ALKPHOS 60 62  BILITOT 0.40 0.7  PROT 5.9* 6.2*  ALBUMIN 2.6* 2.9*   No results for input(s): LIPASE, AMYLASE in the last 168 hours. No results for input(s):  AMMONIA in the last 168 hours. Coagulation Profile: Recent Labs  Lab 12/21/16 2233  INR 1.05   Cardiac Enzymes: No results for input(s): CKTOTAL, CKMB, CKMBINDEX, TROPONINI in the last 168 hours. BNP (last 3 results) No results for input(s): PROBNP in the last 8760 hours. HbA1C: No results for input(s): HGBA1C in the last 72 hours. CBG: No results for input(s): GLUCAP in the last 168 hours. Lipid Profile: No results for input(s): CHOL, HDL, LDLCALC, TRIG, CHOLHDL, LDLDIRECT in the last 72 hours. Thyroid Function Tests: No results for input(s): TSH, T4TOTAL, FREET4, T3FREE, THYROIDAB in the last 72 hours. Anemia Panel: No results for input(s): VITAMINB12, FOLATE, FERRITIN, TIBC, IRON, RETICCTPCT in the last 72 hours. Urine analysis: No results found for: COLORURINE, APPEARANCEUR, LABSPEC, PHURINE, GLUCOSEU, HGBUR, BILIRUBINUR, KETONESUR, PROTEINUR, UROBILINOGEN, NITRITE, LEUKOCYTESUR Sepsis Labs: !!!!!!!!!!!!!!!!!!!!!!!!!!!!!!!!!!!!!!!!!!!! _0 (procalcitonin:4,lacticidven:4) )No results found for this or any previous visit (from the past 240 hour(s)).   Radiological Exams on Admission: Dg Chest Portable 1 View  Result Date: 12/21/2016 CLINICAL DATA:  Hypotension fever and sepsis EXAM: PORTABLE CHEST 1 VIEW COMPARISON:  10/14/2016, PET-CT 09/20/2016 FINDINGS: Right-sided central venous port tip overlies the SVC. Post sternotomy changes. Moderate left pleural effusion. Dense consolidation at the lingula and left base. Enlarged cardiomediastinal silhouette. Aortic atherosclerosis. No pneumothorax. IMPRESSION: Moderate left pleural effusion with consolidation at the lingula and left lung base. Diffuse hazy opacity on the left may relate to layering effusion. Cardiomegaly Electronically Signed   By: Donavan Foil M.D.   On: 12/21/2016 23:21    Assessment/Plan 83 yo male with neutropenic fever source pna  Principal Problem:   PNA (pneumonia)- obtain blood and sputum cx.  Place  on vanc/cefepime.  Vitals stable.  Active Problems:   Neutropenic fever ([place])- will need to call his oncologist in am.   Emphysema lung ([place])- stable   Essential hypertension- clarify home meds   Stage IV squamous cell carcinoma of left  lung ([place])- active chemo pt, cont as outpt    home med list pending   DVT prophylaxis:  scds Code Status:  full Family Communication: wife Disposition Plan:  Per day team Consults called:  none Admission status:  admission   Jacki Couse A MD [facility]  If 7PM-7AM, please contact night-coverage [email or web address] Password [email or web address]  12/22/2016, 12:32 AM

## 2016-12-22 NOTE — Progress Notes (Signed)
PROGRESS NOTE    Steve Frank  ZJQ:[id number] DOB: 1931/10/17 DOA: 12/21/2016 PCP: [name], MD   Brief Narrative:  Steve Frank is a 83 y.o. male with medical history significant of lung cancer, htn comes in with fever , coughing for several days.  Fever was noted today by wife.  He has been having problems with low wbc with his chemo.  No n/v/d.  No urinary symptoms.  Pt found to have pna and neutropenic.  Referred for admission for both.  Initial bp in triage was very low, this has been deemed to be errors.  bp is normal.     Assessment & Plan:   Principal Problem:   Neutropenic fever (HCC) Active Problems:   PNA (pneumonia)   Pleural effusion   Diabetic polyneuropathy associated with type 2 diabetes mellitus (HCC)   Emphysema lung (HCC)   Essential hypertension   Generalized weakness   GERD (gastroesophageal reflux disease)   HLD (hyperlipidemia)   Stage IV squamous cell carcinoma of left lung (HCC)   Port-A-Cath in place   Pancytopenia ([place])  #1 neutropenic fever Likely secondary to pneumonia noted on chest x-ray.  Patient has been pancultured and results pending.  Urinalysis nitrite negative leukocytes -0-5 WBCs.  Urine strep pneumococcus antigen is negative.  Fever curve trending down.  Neutropenia slowly improving.  Placed on Mucinex twice daily.  Placed on scheduled duo nebs.  Continue empiric IV vancomycin and IV cefepime.  Follow.  2.  Pneumonia Patient presented with fevers, cough, recent chemotherapy chest x-ray consistent with a left lingular and left basilar consolidation/infiltrate.  Patient also noted to have a left pleural effusion.  Fever curve trending down.  Neutropenia slowly improving.  Urine strep pneumococcus antigen negative.  Patient has been pancultured results pending.  Placed on Mucinex 1200 mg twice daily.  Placed on scheduled duo nebs.  Continue empiric IV vancomycin and IV cefepime.  3.  Left pleural effusion The patient had presented  with shortness of breath, cough, fevers noted to have a left lingula and left lower lobe pneumonia per chest x-ray.  Left pleural effusion also noted.  Will get a ultrasound-guided therapeutic and diagnostic thoracentesis.  Continue empiric IV antibiotics.  Supportive care.  Follow.  4.  Pancytopenia Likely secondary to recent chemotherapy.  Repeat labs in the morning.  If [place] is less than 1 we will place on some Neupogen or Granix.  Follow.  5.  Diabetes mellitus II Hemoglobin A1c was 6.8 on 10/14/2016.  Place on sliding scale insulin and home regimen of long-acting insulin Lantus 15 units at bedtime.  IV fluids.  Supportive care.  6.  Gastroesophageal reflux disease PPI.  7.  COPD Stable.  #8 squamous cell carcinoma of the left lung stage IV Patient states his oncologist came by to visit, Dr. Lorna Few.  Continue current care. Per oncology.   DVT prophylaxis: SCDs Code Status: Full Family Communication: Updated patient.  No family at bedside. Disposition Plan: To be determined pending hospitalization.   Consultants:   Oncology notified via epic: Dr. Lorna Few  Procedures:   Chest x-ray 12/21/2016    Antimicrobials:   IV cefepime 12/22/2016  IV vancomycin 12/22/2016   Subjective: Patient states no significant improvement since admission.  No improvement with cough.  Patient states as long as he lays still shortness of breath is okay however on minimal exertion has shortness of breath.  Patient denies any chest pain.  No abdominal pain.  No nausea or vomiting.  Objective: Vitals:   12/22/16 0231 12/22/16 0556 12/22/16 1058 12/22/16 1329  BP: 127/65 130/70  110/62  Pulse: 76 74  68  Resp: 16 14  16   Temp: (!) 97.5 F (36.4 C) 98 F (36.7 C)  98.7 F (37.1 C)  TempSrc: Oral Oral  Oral  SpO2: 94% 96% 95% 98%  Weight:  82 kg (180 lb 12.4 oz)    Height:  5\' 11"  (1.803 m)      Intake/Output Summary (Last 24 hours) at 12/22/2016 1644 Last data filed  at 12/22/2016 1525 Gross per 24 hour  Intake 7430 ml  Output 480 ml  Net 6950 ml   Filed Weights   12/22/16 0556  Weight: 82 kg (180 lb 12.4 oz)    Examination:  General exam: Appears calm and comfortable. NAD Respiratory system: Some coarse breath sounds in the left base otherwise clear.  No wheezing.  No crackles.  Decreased breath sounds in the left base.  Respiratory effort normal. Cardiovascular system: S1 & S2 heard, RRR. No JVD, murmurs, rubs, gallops or clicks. No pedal edema. Gastrointestinal system: Abdomen is nondistended, soft and nontender. No organomegaly or masses felt. Normal bowel sounds heard. Central nervous system: Alert and oriented. No focal neurological deficits. Extremities: Symmetric 5 x 5 power. Skin: No rashes, lesions or ulcers Psychiatry: Judgement and insight appear fair. Mood & affect appropriate.     Data Reviewed: I have personally reviewed following labs and imaging studies  CBC: Recent Labs  Lab 12/20/16 0931 12/21/16 2233 12/22/16 0357  WBC 0.7* 0.8* 1.1*  NEUTROABS 0.3* 0.2* 0.3*  HGB 11.6* 11.8* 10.1*  HCT 34.8* 34.6* 30.1*  MCV 91.8 90.6 90.9  PLT 82* 85* 76*   Basic Metabolic Panel: Recent Labs  Lab 12/20/16 0931 12/21/16 2233 12/22/16 0357  NA 136 134* 135  K 4.4 4.0 3.6  CL  --  102 108  CO2 22 23 21*  GLUCOSE 158* 183* 139*  BUN 26.3* 24* 22*  CREATININE 0.9 1.15 0.86  CALCIUM 8.6 8.7* 7.6*   GFR: Estimated Creatinine Clearance: 69.3 mL/min (by C-G formula based on SCr of 0.86 mg/dL). Liver Function Tests: Recent Labs  Lab 12/20/16 0931 12/21/16 2233  AST 19 24  ALT 19 19  ALKPHOS 60 62  BILITOT 0.40 0.7  PROT 5.9* 6.2*  ALBUMIN 2.6* 2.9*   No results for input(s): LIPASE, AMYLASE in the last 168 hours. No results for input(s): AMMONIA in the last 168 hours. Coagulation Profile: Recent Labs  Lab 12/21/16 2233  INR 1.05   Cardiac Enzymes: No results for input(s): CKTOTAL, CKMB, CKMBINDEX, TROPONINI  in the last 168 hours. BNP (last 3 results) No results for input(s): PROBNP in the last 8760 hours. HbA1C: No results for input(s): HGBA1C in the last 72 hours. CBG: Recent Labs  Lab 12/22/16 1153  GLUCAP 173*   Lipid Profile: No results for input(s): CHOL, HDL, LDLCALC, TRIG, CHOLHDL, LDLDIRECT in the last 72 hours. Thyroid Function Tests: No results for input(s): TSH, T4TOTAL, FREET4, T3FREE, THYROIDAB in the last 72 hours. Anemia Panel: No results for input(s): VITAMINB12, FOLATE, FERRITIN, TIBC, IRON, RETICCTPCT in the last 72 hours. Sepsis Labs: Recent Labs  Lab 12/21/16 2233 12/21/16 2244  PROCALCITON 0.21  --   LATICACIDVEN  --  1.49    Recent Results (from the past 240 hour(s))  Culture, sputum-assessment     Status: None   Collection Time: 12/22/16  9:50 AM  Result Value Ref Range Status   Specimen  Description SPUTUM  Final   Special Requests NONE  Final   Sputum evaluation THIS SPECIMEN IS ACCEPTABLE FOR SPUTUM CULTURE  Final   Report Status 12/22/2016 FINAL  Final  Culture, respiratory (NON-Expectorated)     Status: None (Preliminary result)   Collection Time: 12/22/16  9:50 AM  Result Value Ref Range Status   Specimen Description SPUTUM  Final   Special Requests NONE Reflexed from [id number]  Final   Gram Stain   Final    MODERATE WBC PRESENT, PREDOMINANTLY MONONUCLEAR NO ORGANISMS SEEN Performed at [facility], [address] [place], [place] [address]    Culture PENDING  Incomplete   Report Status PENDING  Incomplete         Radiology Studies: Dg Chest Portable 1 View  Result Date: 12/21/2016 CLINICAL DATA:  Hypotension fever and sepsis EXAM: PORTABLE CHEST 1 VIEW COMPARISON:  10/14/2016, PET-CT 09/20/2016 FINDINGS: Right-sided central venous port tip overlies the SVC. Post sternotomy changes. Moderate left pleural effusion. Dense consolidation at the lingula and left base. Enlarged cardiomediastinal silhouette. Aortic atherosclerosis. No  pneumothorax. IMPRESSION: Moderate left pleural effusion with consolidation at the lingula and left lung base. Diffuse hazy opacity on the left may relate to layering effusion. Cardiomegaly Electronically Signed   By: Donavan Foil M.D.   On: 12/21/2016 23:21        Scheduled Meds: . gabapentin  400 mg Oral QHS  . insulin aspart  0-15 Units Subcutaneous TID WC  . insulin aspart  0-5 Units Subcutaneous QHS  . insulin glargine  15 Units Subcutaneous QHS  . lidocaine      . mirtazapine  15 mg Oral QHS  . neomycin-bacitracin-polymyxin  1 application Topical QHS  . pantoprazole  40 mg Oral QHS  . rosuvastatin  20 mg Oral Daily  . sodium chloride flush  3 mL Intravenous Q12H  . sucralfate  1 g Oral TID WC & HS  . triamcinolone  1 application Topical BID   Continuous Infusions: . sodium chloride    . ceFEPime (MAXIPIME) IV Stopped (12/22/16 1437)  . vancomycin Stopped (12/22/16 1525)     LOS: 0 days    Time spent: 35 minutes    Irine Seal, MD [facility] Pager [phone]  If 7PM-7AM, please contact night-coverage [email or web address] Password [email or web address] 12/22/2016, 4:44 PM

## 2016-12-22 NOTE — Progress Notes (Signed)
Subjective: The patient is seen and examined today.  He is a very pleasant 83 years old white male recently diagnosed with metastatic non-small cell lung cancer, squamous cell carcinoma with brain metastasis status post stereotactic radiotherapy to the metastatic brain lesion.  The patient is currently undergoing systemic chemotherapy with carboplatin and paclitaxel status post 1 cycle last week.  His wife noted that the patient has a fever at home and she brought him to the emergency department for evaluation.  Temperature was up to 100.6.  CBC at the time of evaluation showed low white blood count of 0.8 with absolute neutrophil count of 200.  He also has low platelets count of 85,000. The patient was a started on IV antibiotics with cefepime, vancomycin and Zosyn. Is feeling much better today with no specific complaints.  He denied having any fever or chills.  He has no nausea or vomiting.  Objective: Vital signs in last 24 hours: Temp:  [97.5 F (36.4 C)-100.6 F (38.1 C)] 98.7 F (37.1 C) (11/15 1329) Pulse Rate:  [62-76] 68 (11/15 1329) Resp:  [13-31] 16 (11/15 1329) BP: (58-149)/(40-103) 123/65 (11/15 1645) SpO2:  [92 %-99 %] 92 % (11/15 1707) Weight:  [180 lb 12.4 oz (82 kg)] 180 lb 12.4 oz (82 kg) (11/15 0556)  Intake/Output from previous day: 11/14 0701 - 11/15 0700 In: 6600 [I.V.:2500; IV Piggyback:4100] Out: 300 [Urine:300] Intake/Output this shift: No intake/output data recorded.  General appearance: alert, cooperative, fatigued and no distress Resp: clear to auscultation bilaterally Cardio: regular rate and rhythm, S1, S2 normal, no murmur, click, rub or gallop GI: soft, non-tender; bowel sounds normal; no masses,  no organomegaly Extremities: extremities normal, atraumatic, no cyanosis or edema  Lab Results:  Recent Labs    12/21/16 2233 12/22/16 0357  WBC 0.8* 1.1*  HGB 11.8* 10.1*  HCT 34.6* 30.1*  PLT 85* 76*   BMET Recent Labs    12/21/16 2233  12/22/16 0357  NA 134* 135  K 4.0 3.6  CL 102 108  CO2 23 21*  GLUCOSE 183* 139*  BUN 24* 22*  CREATININE 1.15 0.86  CALCIUM 8.7* 7.6*    Studies/Results: Dg Chest 1 View  Result Date: 12/22/2016 CLINICAL DATA:  Status post LEFT thoracentesis EXAM: CHEST 1 VIEW COMPARISON:  None. FINDINGS: Reduction in LEFT pleural effusion lung thoracentesis. Pneumothorax on LEFT. RIGHT prior port noted. IMPRESSION: Reduction pleural fluid following LEFT thoracentesis. No pneumothorax appreciated. Electronically Signed   By: Suzy Bouchard M.D.   On: 12/22/2016 17:16   Dg Chest Portable 1 View  Result Date: 12/21/2016 CLINICAL DATA:  Hypotension fever and sepsis EXAM: PORTABLE CHEST 1 VIEW COMPARISON:  10/14/2016, PET-CT 09/20/2016 FINDINGS: Right-sided central venous port tip overlies the SVC. Post sternotomy changes. Moderate left pleural effusion. Dense consolidation at the lingula and left base. Enlarged cardiomediastinal silhouette. Aortic atherosclerosis. No pneumothorax. IMPRESSION: Moderate left pleural effusion with consolidation at the lingula and left lung base. Diffuse hazy opacity on the left may relate to layering effusion. Cardiomegaly Electronically Signed   By: Donavan Foil M.D.   On: 12/21/2016 23:21   US Thoracentesis Asp Pleural Space W/img Guide  Result Date: 12/22/2016 INDICATION: Patient with history of pneumonia, squamous cell carcinoma lung, left pleural effusion, dyspnea. Request made for diagnostic and therapeutic left thoracentesis. EXAM: ULTRASOUND GUIDED DIAGNOSTIC AND THERAPEUTIC LEFT THORACENTESIS MEDICATIONS: None. COMPLICATIONS: None immediate. PROCEDURE: An ultrasound guided thoracentesis was thoroughly discussed with the patient and questions answered. The benefits, risks, alternatives and complications were also  discussed. The patient understands and wishes to proceed with the procedure. Written consent was obtained. Ultrasound was performed to localize and mark an  adequate pocket of fluid in the left chest. The area was then prepped and draped in the normal sterile fashion. 1% Lidocaine was used for local anesthesia. Under ultrasound guidance a Safe-T-Centesis catheter was introduced. Thoracentesis was performed. The catheter was removed and a dressing applied. FINDINGS: A total of approximately 1.5 liters of yellow fluid was removed. Samples were sent to the laboratory as requested by the clinical team. IMPRESSION: Successful ultrasound guided diagnostic and therapeutic left thoracentesis yielding 1.5 liters of pleural fluid. Read by: Rowe Robert, PA-C Electronically Signed   By: Jerilynn Mages.  Shick M.D.   On: 12/22/2016 16:50    Medications: I have reviewed the patient's current medications.   Assessment/Plan: This is a very pleasant 83 years old white male with metastatic non-small cell lung cancer, squamous cell carcinoma status post stereotactic radiotherapy to the brain and he is currently undergoing systemic chemotherapy with carboplatin and paclitaxel.  The patient presented to the hospital today with neutropenic fever. He did not receive Neulasta after his first cycle of the chemotherapy. I recommended for the patient to start treatment with Granix 480 mcg subcutaneously on daily basis until his absolute neutrophil count is over 1000. The patient will continue his current treatment with cefepime, Zosyn and vancomycin. For the anemia, we will continue to monitor his hemoglobin and hematocrit for now. For the thrombocytopenia, we will continue to monitor his platelet count. The patient was also found to have left pleural effusion and he scheduled for ultrasound-guided left thoracentesis. Thank you for taking good care of Mr. Wrenn, I would continue to follow up the patient with you and assist in his management on as-needed basis. Disclaimer: This note was dictated with voice recognition software. Similar sounding words can inadvertently be transcribed and may be  missed upon review.   LOS: 0 days   Eilleen Kempf 12/22/2016

## 2016-12-22 NOTE — Progress Notes (Signed)
Transported to ultrasound dept for thoracenthesis.

## 2016-12-22 NOTE — Progress Notes (Signed)
Per Shona Simpson, PA-C radiation treatment will be held today due to extremely low counts. Raquel Sarna, RT on L1 informed of this finding.

## 2016-12-23 ENCOUNTER — Ambulatory Visit
Admission: RE | Admit: 2016-12-23 | Discharge: 2016-12-23 | Disposition: A | Discharge status: Home / Self Care | Payer: Medicare Other | Source: Ambulatory Visit | Attending: Radiation Oncology | Admitting: Radiation Oncology

## 2016-12-23 ENCOUNTER — Inpatient Hospital Stay (HOSPITAL_COMMUNITY): Payer: Medicare Other

## 2016-12-23 DIAGNOSIS — E785 Hyperlipidemia, unspecified: Secondary | ICD-10-CM

## 2016-12-23 DIAGNOSIS — R7881 Bacteremia: Secondary | ICD-10-CM

## 2016-12-23 DIAGNOSIS — Z9889 Other specified postprocedural states: Secondary | ICD-10-CM

## 2016-12-23 DIAGNOSIS — E1142 Type 2 diabetes mellitus with diabetic polyneuropathy: Secondary | ICD-10-CM

## 2016-12-23 DIAGNOSIS — I5030 Unspecified diastolic (congestive) heart failure: Secondary | ICD-10-CM

## 2016-12-23 LAB — IRON AND TIBC
Iron: 25 ug/dL — ABNORMAL LOW (ref 45–182)
SATURATION RATIOS: 15 % — AB (ref 17.9–39.5)
TIBC: 167 ug/dL — ABNORMAL LOW (ref 250–450)
UIBC: 142 ug/dL

## 2016-12-23 LAB — BASIC METABOLIC PANEL
Anion gap: 4 — ABNORMAL LOW (ref 5–15)
BUN: 21 mg/dL — AB (ref 6–20)
CHLORIDE: 113 mmol/L — AB (ref 101–111)
CO2: 20 mmol/L — AB (ref 22–32)
Calcium: 7.5 mg/dL — ABNORMAL LOW (ref 8.9–10.3)
Creatinine, Ser: 0.93 mg/dL (ref 0.61–1.24)
GFR calc Af Amer: >60 mL/min (ref >60)
GFR calc non Af Amer: >60 mL/min (ref >60)
GLUCOSE: 79 mg/dL (ref 65–99)
POTASSIUM: 3.2 mmol/L — AB (ref 3.5–5.1)
Sodium: 137 mmol/L (ref 135–145)

## 2016-12-23 LAB — HEPATIC FUNCTION PANEL
ALT: 17 U/L (ref 17–63)
AST: 23 U/L (ref 15–41)
Albumin: 2.3 g/dL — ABNORMAL LOW (ref 3.5–5.0)
Alkaline Phosphatase: 52 U/L (ref 38–126)
Bilirubin, Direct: 0.1 mg/dL (ref 0.1–0.5)
Indirect Bilirubin: 0.7 mg/dL (ref 0.3–0.9)
Total Bilirubin: 0.8 mg/dL (ref 0.3–1.2)
Total Protein: 5.3 g/dL — ABNORMAL LOW (ref 6.5–8.1)

## 2016-12-23 LAB — CBC WITH DIFFERENTIAL/PLATELET
BASOS PCT: 2 %
Basophils Absolute: 0.1 10*3/uL (ref 0.0–0.1)
EOS PCT: 7 %
Eosinophils Absolute: 0.3 10*3/uL (ref 0.0–0.7)
HEMATOCRIT: 30 % — AB (ref 39.0–52.0)
HEMOGLOBIN: 10.3 g/dL — AB (ref 13.0–17.0)
LYMPHS ABS: 0.2 10*3/uL — AB (ref 0.7–4.0)
Lymphocytes Relative: 5 %
MCH: 31.3 pg (ref 26.0–34.0)
MCHC: 34.3 g/dL (ref 30.0–36.0)
MCV: 91.2 fL (ref 78.0–100.0)
MONOS PCT: 30 %
Monocytes Absolute: 1.2 10*3/uL — ABNORMAL HIGH (ref 0.1–1.0)
NEUTROS ABS: 2.1 10*3/uL (ref 1.7–7.7)
Neutrophils Relative %: 56 %
Platelets: 70 10*3/uL — ABNORMAL LOW (ref 150–400)
RBC: 3.29 MIL/uL — AB (ref 4.22–5.81)
RDW: 14.6 % (ref 11.5–15.5)
WBC: 3.9 10*3/uL — ABNORMAL LOW (ref 4.0–10.5)

## 2016-12-23 LAB — URINE CULTURE: CULTURE: NO GROWTH

## 2016-12-23 LAB — GRAM STAIN

## 2016-12-23 LAB — BLOOD CULTURE ID PANEL (REFLEXED)
Acinetobacter baumannii: NOT DETECTED
CANDIDA ALBICANS: NOT DETECTED
Candida glabrata: NOT DETECTED
Candida krusei: NOT DETECTED
Candida parapsilosis: NOT DETECTED
Candida tropicalis: NOT DETECTED
ENTEROBACTER CLOACAE COMPLEX: NOT DETECTED
Enterobacteriaceae species: NOT DETECTED
Enterococcus species: NOT DETECTED
Escherichia coli: NOT DETECTED
Haemophilus influenzae: NOT DETECTED
KLEBSIELLA PNEUMONIAE: NOT DETECTED
Klebsiella oxytoca: NOT DETECTED
Listeria monocytogenes: NOT DETECTED
METHICILLIN RESISTANCE: DETECTED — AB
NEISSERIA MENINGITIDIS: NOT DETECTED
PROTEUS SPECIES: NOT DETECTED
Pseudomonas aeruginosa: NOT DETECTED
STAPHYLOCOCCUS AUREUS BCID: NOT DETECTED
STREPTOCOCCUS PNEUMONIAE: NOT DETECTED
STREPTOCOCCUS PYOGENES: NOT DETECTED
Serratia marcescens: NOT DETECTED
Staphylococcus species: DETECTED — AB
Streptococcus agalactiae: NOT DETECTED
Streptococcus species: NOT DETECTED

## 2016-12-23 LAB — RETICULOCYTES
RBC.: 3.49 MIL/uL — ABNORMAL LOW (ref 4.22–5.81)
Retic Count, Absolute: 48.9 10*3/uL (ref 19.0–186.0)
Retic Ct Pct: 1.4 % (ref 0.4–3.1)

## 2016-12-23 LAB — PH, BODY FLUID: pH, Body Fluid: 7.7

## 2016-12-23 LAB — GLUCOSE, CAPILLARY
Glucose-Capillary: 71 mg/dL (ref 65–99)
Glucose-Capillary: 103 mg/dL — ABNORMAL HIGH (ref 65–99)
Glucose-Capillary: 103 mg/dL — ABNORMAL HIGH (ref 65–99)
Glucose-Capillary: 95 mg/dL (ref 65–99)

## 2016-12-23 LAB — ECHOCARDIOGRAM COMPLETE
Height: 71 in
Weight: 2892.44 oz

## 2016-12-23 LAB — AMYLASE, PLEURAL OR PERITONEAL FLUID: AMYLASE FL: 34 U/L

## 2016-12-23 LAB — MAGNESIUM: Magnesium: 1.4 mg/dL — ABNORMAL LOW (ref 1.7–2.4)

## 2016-12-23 LAB — FOLATE: Folate: 34 ng/mL (ref >5.9)

## 2016-12-23 LAB — FERRITIN: Ferritin: 255 ng/mL (ref 24–336)

## 2016-12-23 LAB — LACTATE DEHYDROGENASE: LDH: 176 U/L (ref 98–192)

## 2016-12-23 LAB — VITAMIN B12: VITAMIN B 12: 904 pg/mL (ref 180–914)

## 2016-12-23 MED ORDER — MAGNESIUM SULFATE 4 GM/100ML IV SOLN
4.0000 g | Freq: Once | INTRAVENOUS | Status: AC
  Administered 2016-12-23: 4 g via INTRAVENOUS
  Filled 2016-12-23: qty 100

## 2016-12-23 MED ORDER — IPRATROPIUM-ALBUTEROL 0.5-2.5 (3) MG/3ML IN SOLN
3.0000 mL | Freq: Two times a day (BID) | RESPIRATORY_TRACT | Status: DC
  Administered 2016-12-23 – 2016-12-26 (×6): 3 mL via RESPIRATORY_TRACT
  Filled 2016-12-23 (×6): qty 3

## 2016-12-23 MED ORDER — POTASSIUM CHLORIDE CRYS ER 20 MEQ PO TBCR
40.0000 meq | EXTENDED_RELEASE_TABLET | ORAL | Status: AC
  Administered 2016-12-23 (×2): 40 meq via ORAL
  Filled 2016-12-23 (×2): qty 2

## 2016-12-23 NOTE — Progress Notes (Signed)
  Echocardiogram 2D Echocardiogram has been performed.  Darlina Sicilian M 12/23/2016, 2:16 PM

## 2016-12-23 NOTE — Evaluation (Signed)
Occupational Therapy Evaluation Patient Details Name: Steve Frank MRN: [id number] DOB: 1932-01-07 Today's Date: 12/23/2016    History of Present Illness Steve Frank is a 83 y.o. male with medical history significant of lung cancer, HTN, CAD, cerebellar mass; pt adm with fever, cough   Clinical Impression   Pt was admitted for the above. Pt has recently been having falls and is ataxic. Will follow in acute setting with min guard level goals. He needs min A overall at this time, for adls at sit to stand level. Did not walk with pt this session as IV was leaking    Follow Up Recommendations  SNF;Supervision/Assistance - 24 hour(pt does not want snf)    Equipment Recommendations  (to be further assessed)    Recommendations for Other Services       Precautions / Restrictions Precautions Precautions: Fall Precaution Comments: ataxic, reports multiple falls at home Restrictions Weight Bearing Restrictions: No      Mobility Bed Mobility Overal bed mobility: Needs Assistance Bed Mobility: Supine to Sit     Supine to sit: Min guard     General bed mobility comments: oob by PT  Transfers Overall transfer level: Needs assistance Equipment used: Rolling walker (2 wheeled) Transfers: Sit to/from Stand Sit to Stand: Min assist         General transfer comment: cues for UE placement    Balance Overall balance assessment: Needs assistance;History of Falls(multiple falls,most recent last wk)   Sitting balance-Leahy Scale: Fair       Standing balance-Leahy Scale: Poor Standing balance comment: reliant on UEs and external support;                            ADL either performed or assessed with clinical judgement   ADL Overall ADL's : Needs assistance/impaired Eating/Feeding: Independent   Grooming: Set up;Sitting   Upper Body Bathing: Set up;Sitting   Lower Body Bathing: Minimal assistance;Sit to/from stand   Upper Body Dressing : Minimal  assistance;Sitting(lines)   Lower Body Dressing: Minimal assistance;Sit to/from stand                 General ADL Comments: pt was in chair.  Only performed sit to stand--IV was leaking.  Notified RN.  Pt with losses of balance with PT earlier     Vision         Perception     Praxis      Pertinent Vitals/Pain Pain Assessment: No/denies pain     Hand Dominance     Extremity/Trunk Assessment Upper Extremity Assessment Upper Extremity Assessment: Generalized weakness      Cervical / Trunk Assessment Cervical / Trunk Assessment: Kyphotic   Communication Communication Communication: No difficulties   Cognition Arousal/Alertness: Awake/alert Behavior During Therapy: WFL for tasks assessed/performed Overall Cognitive Status: Within Functional Limits for tasks assessed                                 General Comments: appears wfls, unsure about memory   General Comments       Exercises     Shoulder Instructions      Home Living Family/patient expects to be discharged to:: Private residence Living Arrangements: Spouse/significant other Available Help at Discharge: Family;Available 24 hours/day Type of Home: House Home Access: Stairs to enter [employer] of Steps: 2 Entrance Stairs-Rails: Right Home Layout: One level  Bathroom Shower/Tub: Tub/shower unit(pt has been sponge bathing)   Bathroom Toilet: Standard(grab bar next to it)     Home Equipment: Environmental consultant - 2 wheels;Wheelchair - manual;Grab bars - tub/shower;Grab bars - toilet;Walker - 4 wheels   Additional Comments: pt reports he has rails in the garage to get to the car and only goes out when he has to do so      Prior Functioning/Environment Level of Independence: Independent with assistive device(s);Independent        Comments: uses w/c for longer distances, also in the house some of the time; has not been walking much for the last 3-6 months, he does walk into  the bathroom and short distances at times (in the house) ; repeated falls, most recent being ~ 1 week ago--fell with rollator        OT Problem List: Decreased strength;Decreased activity tolerance;Impaired balance (sitting and/or standing)      OT Treatment/Interventions: Self-care/ADL training;DME and/or AE instruction;Patient/family education;Balance training    OT Goals(Current goals can be found in the care plan section) Acute Rehab OT Goals Patient Stated Goal: avoid falling OT Goal Formulation: With patient Time For Goal Achievement: 12/30/16 Potential to Achieve Goals: Good ADL Goals Pt Will Transfer to Toilet: with min guard assist;ambulating;regular height toilet;grab bars Additional ADL Goal #1: pt will not have any LOB during ADLs, sit to stand at min guard level  OT Frequency: Min 2X/week   Barriers to D/C:            Co-evaluation              AM-PAC PT "6 Clicks" Daily Activity     Outcome Measure Help from another person eating meals?: None Help from another person taking care of personal grooming?: A Little Help from another person toileting, which includes using toliet, bedpan, or urinal?: A Little Help from another person bathing (including washing, rinsing, drying)?: A Little Help from another person to put on and taking off regular upper body clothing?: A Little Help from another person to put on and taking off regular lower body clothing?: A Little 6 Click Score: 19   End of Session    Activity Tolerance: Patient tolerated treatment well Patient left: in chair;with call bell/phone within reach;with chair alarm set  OT Visit Diagnosis: Unsteadiness on feet (R26.81)                Time: 7846-9629 OT Time Calculation (min): 18 min Charges:  OT General Charges $OT Visit: 1 Visit OT Evaluation $OT Eval Low Complexity: 1 Low G-Codes:     [place], OTR/L [phone] 12/23/2016  Steve Frank 12/23/2016, 4:11 PM

## 2016-12-23 NOTE — Progress Notes (Signed)
PROGRESS NOTE    Steve Frank  IRC:[id number] DOB: 11-20-1931 DOA: 12/21/2016 PCP: [name], MD   Brief Narrative:  Steve Frank is a 83 y.o. male with medical history significant of lung cancer, htn comes in with fever , coughing for several days.  Fever was noted today by wife.  He has been having problems with low wbc with his chemo.  No n/v/d.  No urinary symptoms.  Pt found to have pna and neutropenic.  Referred for admission for both.  Initial bp in triage was very low, this has been deemed to be errors.  bp is normal.     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  Patient has been pancultured and results pending.  Initial blood cultures concerning for coagulase-negative staph.  Will repeat blood cultures.  Urinalysis nitrite negative leukocytes -0-5 WBCs.  Urine strep pneumococcus antigen is negative.  Fever curve trending down.  Neutropenia slowly improving.  Placed on Mucinex twice daily.  Placed on scheduled duo nebs.  Continue empiric IV vancomycin and IV cefepime.  Follow.  2.  Pneumonia Patient presented with fevers, cough, recent chemotherapy chest x-ray consistent with a left lingular and left basilar consolidation/infiltrate.  Patient also noted to have a left pleural effusion.  Fever curve trending down.  Neutropenia improving.  Urine strep pneumococcus antigen negative.  Patient has been pancultured results pending.  Continue Mucinex 1200 mg twice daily, scheduled  duo nebs, empiric IV vancomycin and IV cefepime.  Will add chest physiotherapy.  3.  Left pleural effusion The patient had presented with shortness of breath, cough, fevers noted to have a left lingula and left lower lobe pneumonia per chest x-ray.  Left pleural effusion also noted.  Patient underwent ultrasound-guided therapeutic and diagnostic thoracentesis.  1.5 L of fluid was removed.  Gram stain and cultures pending.  Check LDH and a protein level.  Continue empiric IV antibiotics.  Supportive care.  Follow.  4.  Pancytopenia Likely secondary to recent chemotherapy.  White count improving.  Patient was started on Granix ANC is now 2.1 and as such we will discontinue Granix.  Patient with no bleeding.  H&H stable.  Follow.   5.  Diabetes mellitus II Hemoglobin A1c was 6.8 on 10/14/2016.  CBGs have ranged from 71-103.  Continue sliding scale insulin and Lantus.  Supportive care.    6.  Gastroesophageal reflux disease Continue PPI.  7.  COPD Stable.  #8 squamous cell carcinoma of the left lung stage IV Patient states his oncologist came by to visit on 12/22/2016, Dr. Lorna Few.  Continue current care. Per oncology.  #9 ??  Bacteremia/coagulase-negative staph Will repeat blood cultures   X2.  Continue empiric IV antibiotics for now.  Follow.   DVT prophylaxis: SCDs Code Status: Full Family Communication: Updated patient.  Updated wife at bedside.  Disposition Plan: To be determined pending hospitalization.   Consultants:   Oncology notified via epic: Dr. Lorna Few  Procedures:   Chest  x-ray 12/21/2016  Ultrasound-guided diagnostic and therapeutic thoracentesis 12/22/2016  Antimicrobials:   IV cefepime 12/22/2016  IV vancomycin 12/22/2016   Subjective: Patient states he does not feel significantly better from admission.  Patient states no change in cough.  Patient states no change in shortness of breath.  Patient denies any chest pain.  Per wife she  thinks patient looks better than he did on admission.   Objective: Vitals:   12/22/16 1707 12/22/16 2115 12/23/16 0500 12/23/16 0853  BP:  110/67 104/65   Pulse:  69 65   Resp:  16 14   Temp:  97.9 F (36.6 C) 98 F (36.7 C)   TempSrc:  Oral Oral   SpO2: 92% 92% 94% 93%  Weight:      Height:        Intake/Output Summary (Last 24 hours) at 12/23/2016 1022 Last data filed at 12/23/2016 0600 Gross per 24 hour  Intake 990 ml  Output 680 ml  Net 310 ml   Filed Weights   12/22/16 0556  Weight: 82 kg (180 lb 12.4 oz)    Examination:  General exam: Appears calm and comfortable. NAD Respiratory system: Scattered coarse breath sounds left greater than right.  No wheezing.  No crackles.  Respiratory effort normal. Cardiovascular system: S1 & S2 heard, RRR with 3/6 SEM. No JVD, murmurs, rubs, gallops or clicks. No pedal edema. Gastrointestinal system: Abdomen is nondistended, soft and nontender. No organomegaly or masses felt. Normal bowel sounds heard. Central nervous system: Alert and oriented. No focal neurological deficits. Extremities: Symmetric 5 x 5 power. Skin: No rashes, lesions or ulcers Psychiatry: Judgement and insight appear fair. Mood & affect appropriate.     Data Reviewed: I have personally reviewed following labs and imaging studies  CBC: Recent Labs  Lab 12/20/16 0931 12/21/16 2233 12/22/16 0357 12/23/16 0405  WBC 0.7* 0.8* 1.1* 3.9*  NEUTROABS 0.3* 0.2* 0.3* 2.1  HGB 11.6* 11.8* 10.1* 10.3*  HCT 34.8* 34.6* 30.1* 30.0*  MCV 91.8 90.6 90.9 91.2  PLT 82* 85* 76* 70*   Basic Metabolic Panel: Recent Labs  Lab 12/20/16 0931 12/21/16 2233 12/22/16 0357 12/23/16 0405  NA 136 134* 135 137  K 4.4 4.0 3.6 3.2*  CL  --  102 108 113*  CO2 22 23 21* 20*  GLUCOSE 158* 183* 139* 79  BUN 26.3* 24* 22* 21*  CREATININE 0.9 1.15 0.86 0.93  CALCIUM 8.6 8.7* 7.6* 7.5*  MG  --   --   --  1.4*   GFR: Estimated Creatinine Clearance: 64.1 mL/min (by C-G  formula based on SCr of 0.93 mg/dL). Liver Function Tests: Recent Labs  Lab 12/20/16 0931 12/21/16 2233 12/23/16 0832  AST 19 24 23   ALT 19 19 17   ALKPHOS 60 62 52  BILITOT 0.40 0.7 0.8  PROT 5.9* 6.2* 5.3*  ALBUMIN 2.6* 2.9* 2.3*   No results for input(s): LIPASE, AMYLASE in the last 168 hours. No results for input(s): AMMONIA in the last 168 hours. Coagulation Profile: Recent Labs  Lab 12/21/16 2233  INR 1.05   Cardiac Enzymes: No results for input(s): CKTOTAL, CKMB, CKMBINDEX, TROPONINI in the last 168 hours. BNP (last 3 results) No results for input(s): PROBNP in the last 8760 hours. HbA1C: No results for input(s): HGBA1C in the last 72 hours. CBG: Recent Labs  Lab 12/22/16 1153 12/22/16 1747 12/22/16 2109 12/23/16 0749  GLUCAP 173* 86 107* 71   Lipid Profile: No results for input(s): CHOL, HDL, LDLCALC, TRIG, CHOLHDL,  LDLDIRECT in the last 72 hours. Thyroid Function Tests: No results for input(s): TSH, T4TOTAL, FREET4, T3FREE, THYROIDAB in the last 72 hours. Anemia Panel: Recent Labs    12/23/16 0832  RETICCTPCT 1.4   Sepsis Labs: Recent Labs  Lab 12/21/16 2233 12/21/16 2244  PROCALCITON 0.21  --   LATICACIDVEN  --  1.49    Recent Results (from the past 240 hour(s))  Culture, blood (Routine x 2)     Status: None (Preliminary result)   Collection Time: 12/21/16 10:33 PM  Result Value Ref Range Status   Specimen Description BLOOD RIGHT ANTECUBITAL  Final   Special Requests   Final    BOTTLES DRAWN AEROBIC AND ANAEROBIC Blood Culture adequate volume   Culture  Setup Time   Final    GRAM POSITIVE COCCI IN CLUSTERS AEROBIC BOTTLE ONLY CRITICAL RESULT CALLED TO, READ BACK BY AND VERIFIED WITH: Jodelle Green GREEN [id number] MLM Performed at [facility], [place] [address] [place], [place] [address]    Culture GRAM POSITIVE COCCI  Final   Report Status PENDING  Incomplete  Blood Culture ID Panel (Reflexed)     Status: Abnormal   Collection Time:  12/21/16 10:33 PM  Result Value Ref Range Status   Enterococcus species NOT DETECTED NOT DETECTED Final   Listeria monocytogenes NOT DETECTED NOT DETECTED Final   Staphylococcus species DETECTED (A) NOT DETECTED Final    Comment: Methicillin (oxacillin) resistant coagulase negative staphylococcus. Possible blood culture contaminant (unless isolated from more than one blood culture draw or clinical case suggests pathogenicity). No antibiotic treatment is indicated for blood  culture contaminants. CRITICAL RESULT CALLED TO, READ BACK BY AND VERIFIED WITH: PHARMD B GREEN [id number] MLM    Staphylococcus aureus NOT DETECTED NOT DETECTED Final   Methicillin resistance DETECTED (A) NOT DETECTED Final    Comment: CRITICAL RESULT CALLED TO, READ BACK BY AND VERIFIED WITH: PHARMD B GREEN [id number] MLM    Streptococcus species NOT DETECTED NOT DETECTED Final   Streptococcus agalactiae NOT DETECTED NOT DETECTED Final   Streptococcus pneumoniae NOT DETECTED NOT DETECTED Final   Streptococcus pyogenes NOT DETECTED NOT DETECTED Final   Acinetobacter baumannii NOT DETECTED NOT DETECTED Final   Enterobacteriaceae species NOT DETECTED NOT DETECTED Final   Enterobacter cloacae complex NOT DETECTED NOT DETECTED Final   Escherichia coli NOT DETECTED NOT DETECTED Final   Klebsiella oxytoca NOT DETECTED NOT DETECTED Final   Klebsiella pneumoniae NOT DETECTED NOT DETECTED Final   Proteus species NOT DETECTED NOT DETECTED Final   Serratia marcescens NOT DETECTED NOT DETECTED Final   Haemophilus influenzae NOT DETECTED NOT DETECTED Final   Neisseria meningitidis NOT DETECTED NOT DETECTED Final   Pseudomonas aeruginosa NOT DETECTED NOT DETECTED Final   Candida albicans NOT DETECTED NOT DETECTED Final   Candida glabrata NOT DETECTED NOT DETECTED Final   Candida krusei NOT DETECTED NOT DETECTED Final   Candida parapsilosis NOT DETECTED NOT DETECTED Final   Candida tropicalis NOT DETECTED NOT DETECTED Final      Comment: Performed at [facility], [place]. [address], [place], [place] [address]  Urine culture     Status: None   Collection Time: 12/22/16  6:03 AM  Result Value Ref Range Status   Specimen Description URINE, CLEAN CATCH  Final   Special Requests NONE  Final   Culture   Final    NO GROWTH Performed at [facility], [place] [address] [place], [place] [address]  Report Status 12/23/2016 FINAL  Final  Culture, sputum-assessment     Status: None   Collection Time: 12/22/16  9:50 AM  Result Value Ref Range Status   Specimen Description SPUTUM  Final   Special Requests NONE  Final   Sputum evaluation THIS SPECIMEN IS ACCEPTABLE FOR SPUTUM CULTURE  Final   Report Status 12/22/2016 FINAL  Final  Culture, respiratory (NON-Expectorated)     Status: None (Preliminary result)   Collection Time: 12/22/16  9:50 AM  Result Value Ref Range Status   Specimen Description SPUTUM  Final   Special Requests NONE Reflexed from [id number]  Final   Gram Stain   Final    MODERATE WBC PRESENT, PREDOMINANTLY MONONUCLEAR NO ORGANISMS SEEN Performed at [facility], [place] [address] [place], [place] [address]    Culture PENDING  Incomplete   Report Status PENDING  Incomplete  Gram stain     Status: None   Collection Time: 12/22/16  5:04 PM  Result Value Ref Range Status   Specimen Description FLUID LEFT PLEURAL  Final   Special Requests NONE  Final   Gram Stain   Final    RARE WBC PRESENT, PREDOMINANTLY MONONUCLEAR NO ORGANISMS SEEN Performed at [facility], [place] [address] [place],  [address]    Report Status 12/23/2016 FINAL  Final         Radiology Studies: Dg Chest 1 View  Result Date: 12/22/2016 CLINICAL DATA:  Status post LEFT thoracentesis EXAM: CHEST 1 VIEW COMPARISON:  None. FINDINGS: Reduction in LEFT pleural effusion lung thoracentesis. Pneumothorax on LEFT. RIGHT prior port noted. IMPRESSION: Reduction pleural fluid following LEFT thoracentesis. No  pneumothorax appreciated. Electronically Signed   By: Suzy Bouchard M.D.   On: 12/22/2016 17:16   Dg Chest Portable 1 View  Result Date: 12/21/2016 CLINICAL DATA:  Hypotension fever and sepsis EXAM: PORTABLE CHEST 1 VIEW COMPARISON:  10/14/2016, PET-CT 09/20/2016 FINDINGS: Right-sided central venous port tip overlies the SVC. Post sternotomy changes. Moderate left pleural effusion. Dense consolidation at the lingula and left base. Enlarged cardiomediastinal silhouette. Aortic atherosclerosis. No pneumothorax. IMPRESSION: Moderate left pleural effusion with consolidation at the lingula and left lung base. Diffuse hazy opacity on the left may relate to layering effusion. Cardiomegaly Electronically Signed   By: Donavan Foil M.D.   On: 12/21/2016 23:21   US Thoracentesis Asp Pleural Space W/img Guide  Result Date: 12/22/2016 INDICATION: Patient with history of pneumonia, squamous cell carcinoma lung, left pleural effusion, dyspnea. Request made for diagnostic and therapeutic left thoracentesis. EXAM: ULTRASOUND GUIDED DIAGNOSTIC AND THERAPEUTIC LEFT THORACENTESIS MEDICATIONS: None. COMPLICATIONS: None immediate. PROCEDURE: An ultrasound guided thoracentesis was thoroughly discussed with the patient and questions answered. The benefits, risks, alternatives and complications were also discussed. The patient understands and wishes to proceed with the procedure. Written consent was obtained. Ultrasound was performed to localize and mark an adequate pocket of fluid in the left chest. The area was then prepped and draped in the normal sterile fashion. 1% Lidocaine was used for local anesthesia. Under ultrasound guidance a Safe-T-Centesis catheter was introduced. Thoracentesis was performed. The catheter was removed and a dressing applied. FINDINGS: A total of approximately 1.5 liters of yellow fluid was removed. Samples were sent to the laboratory as requested by the clinical team. IMPRESSION: Successful  ultrasound guided diagnostic and therapeutic left thoracentesis yielding 1.5 liters of pleural fluid. Read by: Rowe Robert, PA-C Electronically Signed   By: Jerilynn Mages.  Shick M.D.   On: 12/22/2016 16:50  Scheduled Meds: . gabapentin  400 mg Oral QHS  . guaiFENesin  1,200 mg Oral BID  . insulin aspart  0-15 Units Subcutaneous TID WC  . insulin aspart  0-5 Units Subcutaneous QHS  . insulin glargine  15 Units Subcutaneous QHS  . ipratropium-albuterol  3 mL Nebulization BID  . mirtazapine  15 mg Oral QHS  . neomycin-bacitracin-polymyxin  1 application Topical QHS  . pantoprazole  40 mg Oral QHS  . potassium chloride  40 mEq Oral Q4H  . rosuvastatin  20 mg Oral Daily  . sodium chloride flush  3 mL Intravenous Q12H  . sucralfate  1 g Oral TID WC & HS  . Tbo-filgastrim (GRANIX) SQ  480 mcg Subcutaneous q morning - 10a  . triamcinolone  1 application Topical BID   Continuous Infusions: . sodium chloride    . sodium chloride 100 mL/hr at 12/22/16 1823  . ceFEPime (MAXIPIME) IV 2 g (12/22/16 2130)  . magnesium sulfate 1 - 4 g bolus IVPB 4 g (12/23/16 0943)  . vancomycin Stopped (12/22/16 1525)     LOS: 1 day    Time spent: 35 minutes    Irine Seal, MD [facility] Pager [phone]  If 7PM-7AM, please contact night-coverage [email or web address] Password [email or web address] 12/23/2016, 10:22 AM

## 2016-12-23 NOTE — Progress Notes (Signed)
Patient refuses CPT for tonight. Patient stated that he did not get any sleep last night so he wants to rest.

## 2016-12-23 NOTE — Progress Notes (Signed)
PHARMACY - PHYSICIAN COMMUNICATION CRITICAL VALUE ALERT - BLOOD CULTURE IDENTIFICATION (BCID)  Results for orders placed or performed during the hospital encounter of 12/21/16  Blood Culture ID Panel (Reflexed) (Collected: 12/21/2016 10:33 PM)  Result Value Ref Range   Enterococcus species NOT DETECTED NOT DETECTED   Listeria monocytogenes NOT DETECTED NOT DETECTED   Staphylococcus species DETECTED (A) NOT DETECTED   Staphylococcus aureus NOT DETECTED NOT DETECTED   Methicillin resistance DETECTED (A) NOT DETECTED   Streptococcus species NOT DETECTED NOT DETECTED   Streptococcus agalactiae NOT DETECTED NOT DETECTED   Streptococcus pneumoniae NOT DETECTED NOT DETECTED   Streptococcus pyogenes NOT DETECTED NOT DETECTED   Acinetobacter baumannii NOT DETECTED NOT DETECTED   Enterobacteriaceae species NOT DETECTED NOT DETECTED   Enterobacter cloacae complex NOT DETECTED NOT DETECTED   Escherichia coli NOT DETECTED NOT DETECTED   Klebsiella oxytoca NOT DETECTED NOT DETECTED   Klebsiella pneumoniae NOT DETECTED NOT DETECTED   Proteus species NOT DETECTED NOT DETECTED   Serratia marcescens NOT DETECTED NOT DETECTED   Haemophilus influenzae NOT DETECTED NOT DETECTED   Neisseria meningitidis NOT DETECTED NOT DETECTED   Pseudomonas aeruginosa NOT DETECTED NOT DETECTED   Candida albicans NOT DETECTED NOT DETECTED   Candida glabrata NOT DETECTED NOT DETECTED   Candida krusei NOT DETECTED NOT DETECTED   Candida parapsilosis NOT DETECTED NOT DETECTED   Candida tropicalis NOT DETECTED NOT DETECTED    Name of physician (or Provider) Contacted: A Kakrakandy  Changes to prescribed antibiotics required: No change.  Consider narrowing when clinically appropriate.   Dorrene German 12/23/2016  4:52 AM

## 2016-12-23 NOTE — Evaluation (Signed)
Physical Therapy Evaluation Patient Details Name: Steve Frank MRN: [id number] DOB: [birth year] Today's Date: 12/23/2016   History of Present Illness  Steve Frank is a 83 y.o. male with medical history significant of lung cancer, HTN, CAD, cerebellar mass; pt adm with fever, cough  Clinical Impression  Pt admitted with above diagnosis. Pt currently with functional limitations due to the deficits listed below (see PT Problem List). * Pt will benefit from skilled PT to increase their independence and safety with mobility to allow discharge to the venue listed below.  Pleasant and cooperative pt with hx of multiple falls; may benefit from HHPT if pt agreeable, has been to rehab in past and says he has no interest in going to rehab because it doesn't change much; will follow in acute setting     Follow Up Recommendations Home health PT;Supervision/Assistance - 24 hour(vs no f/u--pt may decline)    Equipment Recommendations  None recommended by PT    Recommendations for Other Services       Precautions / Restrictions Precautions Precautions: Fall Precaution Comments: ataxic, reports multiple falls at home      Mobility  Bed Mobility Overal bed mobility: Needs Assistance Bed Mobility: Supine to Sit     Supine to sit: Min guard     General bed mobility comments: for safety  Transfers Overall transfer level: Needs assistance Equipment used: Rolling walker (2 wheeled) Transfers: Sit to/from Stand Sit to Stand: Min assist         General transfer comment: pt pulls up on walker; cues for safety, hand placement, and to control descent  Ambulation/Gait Ambulation/Gait assistance: Min guard Ambulation Distance (Feet): 30 Feet Assistive device: Rolling walker (2 wheeled) Gait Pattern/deviations: Step-to pattern;Decreased step length - left;Decreased step length - right;Staggering left;Trunk flexed;Narrow base of support Gait velocity: decr   General Gait Details: pt with  ataxic, unsteady gait; LOB x4 requring heavy min to light mod assist to maintain midline, verbal cues to stay within frame of RW  Stairs            Wheelchair Mobility    Modified Rankin (Stroke Patients Only)       Balance Overall balance assessment: Needs assistance;History of Falls(multiple falls,most recent last wk)   Sitting balance-Leahy Scale: Fair       Standing balance-Leahy Scale: Poor Standing balance comment: reliant on UEs and external support;                              Pertinent Vitals/Pain Pain Assessment: No/denies pain    Home Living Family/patient expects to be discharged to:: Private residence Living Arrangements: Spouse/significant other Available Help at Discharge: Family;Available 24 hours/day Type of Home: House Home Access: Stairs to enter Entrance Stairs-Rails: Right Entrance Stairs-Number of Steps: 2 Home Layout: One level Home Equipment: Walker - 2 wheels;Wheelchair - manual;Grab bars - tub/shower;Grab bars - toilet;Walker - 4 wheels Additional Comments: pt reports he has rails in the garage to get to the car and only goes out when he has to do so    Prior Function Level of Independence: Independent with assistive device(s);Independent         Comments: uses w/c for longer distances, also in the house some of the time; has not been walking much for the last 3-6 months, he does walk into the bathroom and short distances at times (in the house) ; repeated falls, most recent being ~ 1 week ago--fell  with rollator     Hand Dominance        Extremity/Trunk Assessment   Upper Extremity Assessment Upper Extremity Assessment: Defer to OT evaluation    Lower Extremity Assessment Lower Extremity Assessment: Generalized weakness;LLE deficits/detail;RLE deficits/detail RLE Coordination: decreased gross motor LLE Coordination: decreased gross motor    Cervical / Trunk Assessment Cervical / Trunk Assessment: Kyphotic   Communication   Communication: No difficulties  Cognition Arousal/Alertness: Awake/alert Behavior During Therapy: WFL for tasks assessed/performed Overall Cognitive Status: Within Functional Limits for tasks assessed                                        General Comments      Exercises     Assessment/Plan    PT Assessment Patient needs continued PT services  PT Problem List Decreased strength;Decreased activity tolerance;Decreased balance;Decreased mobility;Decreased coordination;Decreased knowledge of use of DME       PT Treatment Interventions DME instruction;Functional mobility training;Therapeutic activities;Therapeutic exercise;Patient/family education;Balance training    PT Goals (Current goals can be found in the Care Plan section)  Acute Rehab PT Goals Patient Stated Goal: avoid falling PT Goal Formulation: With patient Time For Goal Achievement: 12/30/16 Potential to Achieve Goals: Fair    Frequency Min 3X/week   Barriers to discharge        Co-evaluation               AM-PAC PT "6 Clicks" Daily Activity  Outcome Measure Difficulty turning over in bed (including adjusting bedclothes, sheets and blankets)?: A Little Difficulty moving from lying on back to sitting on the side of the bed? : Unable Difficulty sitting down on and standing up from a chair with arms (e.g., wheelchair, bedside commode, etc,.)?: Unable Help needed moving to and from a bed to chair (including a wheelchair)?: A Little Help needed walking in hospital room?: A Lot Help needed climbing 3-5 steps with a railing? : A Lot 6 Click Score: 12    End of Session Equipment Utilized During Treatment: Gait belt Activity Tolerance: Patient tolerated treatment well Patient left: in chair;with call bell/phone within reach;with chair alarm set;Other (comment)(lab)   PT Visit Diagnosis: Unsteadiness on feet (R26.81);Ataxic gait (R26.0)    Time: 3291-9166 PT Time  Calculation (min) (ACUTE ONLY): 24 min   Charges:   PT Evaluation $PT Eval Moderate Complexity: 1 Mod PT Treatments $Gait Training: 8-22 mins   PT G Codes:          Theresa Wedel 01/21/17, 3:02 PM

## 2016-12-24 LAB — GLUCOSE, CAPILLARY
GLUCOSE-CAPILLARY: 91 mg/dL (ref 65–99)
Glucose-Capillary: 71 mg/dL (ref 65–99)
Glucose-Capillary: 92 mg/dL (ref 65–99)
Glucose-Capillary: 137 mg/dL — ABNORMAL HIGH (ref 65–99)

## 2016-12-24 LAB — MAGNESIUM: Magnesium: 2 mg/dL (ref 1.7–2.4)

## 2016-12-24 LAB — CBC WITH DIFFERENTIAL/PLATELET
Band Neutrophils: 22 %
Basophils Absolute: 0 10*3/uL (ref 0.0–0.1)
Basophils Relative: 0 %
Blasts: 0 %
Eosinophils Absolute: 0.1 10*3/uL (ref 0.0–0.7)
Eosinophils Relative: 1 %
HCT: 32.5 % — ABNORMAL LOW (ref 39.0–52.0)
Hemoglobin: 10.9 g/dL — ABNORMAL LOW (ref 13.0–17.0)
Lymphocytes Relative: 7 %
Lymphs Abs: 0.8 10*3/uL (ref 0.7–4.0)
MCH: 30.7 pg (ref 26.0–34.0)
MCHC: 33.5 g/dL (ref 30.0–36.0)
MCV: 91.5 fL (ref 78.0–100.0)
Metamyelocytes Relative: 2 %
Monocytes Absolute: 0.8 10*3/uL (ref 0.1–1.0)
Monocytes Relative: 7 %
Myelocytes: 1 %
Neutro Abs: 9.1 10*3/uL — ABNORMAL HIGH (ref 1.7–7.7)
Neutrophils Relative %: 60 %
Other: 0 %
Platelets: 92 10*3/uL — ABNORMAL LOW (ref 150–400)
Promyelocytes Absolute: 0 %
RBC: 3.55 MIL/uL — ABNORMAL LOW (ref 4.22–5.81)
RDW: 14.9 % (ref 11.5–15.5)
WBC: 10.8 10*3/uL — ABNORMAL HIGH (ref 4.0–10.5)
nRBC: 0 /100 WBC

## 2016-12-24 LAB — BASIC METABOLIC PANEL
Anion gap: 6 (ref 5–15)
BUN: 18 mg/dL (ref 6–20)
CALCIUM: 7.6 mg/dL — AB (ref 8.9–10.3)
CO2: 17 mmol/L — AB (ref 22–32)
CREATININE: 0.93 mg/dL (ref 0.61–1.24)
Chloride: 114 mmol/L — ABNORMAL HIGH (ref 101–111)
GFR calc Af Amer: >60 mL/min (ref >60)
GFR calc non Af Amer: >60 mL/min (ref >60)
GLUCOSE: 74 mg/dL (ref 65–99)
Potassium: 4 mmol/L (ref 3.5–5.1)
Sodium: 137 mmol/L (ref 135–145)

## 2016-12-24 LAB — CULTURE, BLOOD (ROUTINE X 2): SPECIAL REQUESTS: ADEQUATE

## 2016-12-24 LAB — CULTURE, RESPIRATORY: CULTURE: NORMAL

## 2016-12-24 MED ORDER — SODIUM CHLORIDE 0.9 % IV SOLN
INTRAVENOUS | Status: AC
  Administered 2016-12-24: 18:00:00 via INTRAVENOUS

## 2016-12-24 NOTE — Sepsis Progress Note (Signed)
Pt refused chest vest.He says its uncomfortable.

## 2016-12-24 NOTE — Progress Notes (Signed)
PROGRESS NOTE    Steve Frank  JHE:[id number] DOB: 06-Apr-1931 DOA: 12/21/2016 PCP: [name], MD   Brief Narrative:  Steve Frank is a 83 y.o. male with medical history significant of lung cancer, htn comes in with fever , coughing for several days.  Fever was noted today by wife.  He has been having problems with low wbc with his chemo.  No n/v/d.  No urinary symptoms.  Pt found to have pna and neutropenic.  Referred for admission for both.  Initial bp in triage was very low, this has been deemed to be errors.  bp is normal.     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  Patient has been pancultured and results pending.  Initial blood cultures concerning for coagulase-negative staph.  Repeat blood cultures pending. Urinalysis nitrite negative leukocytes -0-5 WBCs.  Urine strep pneumococcus antigen is negative.  Fever curve trending down.  Neutropenia slowly improving. Continue Mucinex twice daily,scheduled duo nebs.  Continue empiric IV vancomycin and IV cefepime.  Follow.  2.  Pneumonia Patient presented with fevers, cough, recent chemotherapy chest x-ray consistent with a left lingular and left basilar consolidation/infiltrate.  Patient also noted to have a left pleural effusion.  Fever curve trending down.  Neutropenia improving.  Urine strep pneumococcus antigen negative.  Patient has been pancultured results pending.  Continue Mucinex 1200 mg twice daily,  scheduled duo nebs, empiric IV vancomycin and IV cefepime.  Patient refusing chest physiotherapy.  3.  Left pleural effusion The patient had presented with shortness of breath, cough, fevers noted to have a left lingula and left lower lobe pneumonia per chest x-ray.  Left pleural effusion also noted.  Patient underwent ultrasound-guided therapeutic and diagnostic thoracentesis.  1.5 L of fluid was removed.  By light's criteria exudative pleural effusion.  Gram stain and cultures pending with no growth to date.  Cytology pending.  Continue empiric IV antibiotics.  Supportive care.  Follow.  4.  Pancytopenia Likely secondary to recent chemotherapy.  Counts improving. Patient was started on Granix ANC is now 9.1.  Granix discontinued after 1 dose.  Patient with no bleeding.  H&H stable.  Follow.   5.  Diabetes mellitus II Hemoglobin A1c was 6.8 on 10/14/2016.  CBGs have ranged from 71-95.  Continue sliding scale insulin and Lantus.  Supportive care.    6.  Gastroesophageal reflux disease Continue PPI.  7.  COPD Stable.  #8 squamous cell carcinoma of the left lung stage IV Patient states his oncologist came by to visit on 12/22/2016, Dr. Lorna Few.  Continue current care.  Outpatient follow-up.  Per oncology.  #9 ??  Bacteremia/coagulase-negative staph Repeat blood cultures pending.  Likely contamination. Continue empiric IV antibiotics for now.  Follow.   DVT prophylaxis: SCDs Code Status: Full Family Communication: Updated patient and family at bedside. Disposition Plan: Likely home with home health.  To be determined pending hospitalization.   Consultants:   Oncology notified via  epic: Dr. Lorna Few  Procedures:   Chest x-ray 12/21/2016  Ultrasound-guided diagnostic and therapeutic thoracentesis 12/22/2016  Antimicrobials:   IV cefepime 12/22/2016  IV vancomycin 12/22/2016   Subjective: Patient sitting up in bed.  States no significant difference with  shortness of breath or cough since admission although family feels he looks better.  No chest pain.    Objective: Vitals:   12/23/16 1934 12/23/16 2105 12/24/16 0441 12/24/16 0835  BP:  105/61 120/66   Pulse:  65 64   Resp:  15 16   Temp:  97.8 F (36.6 C) 98.2 F (36.8 C)   TempSrc:  Oral Oral   SpO2: 93% 92% 94% 98%  Weight:      Height:        Intake/Output Summary (Last 24 hours) at 12/24/2016 1136 Last data filed at 12/24/2016 0938 Gross per 24 hour  Intake 480 ml  Output 900 ml  Net -420 ml   Filed Weights   12/22/16 0556  Weight: 82 kg (180 lb 12.4 oz)    Examination:  General exam: NAD Respiratory system: Scattered coarse breath sounds left greater than right.  No wheezing.  No crackles.  Respiratory effort normal. Cardiovascular system: S1 & S2 heard, RRR with 3/6 SEM. No JVD, murmurs, rubs, gallops or clicks. No pedal edema. Gastrointestinal system: Abdomen is nondistended, soft and nontender. No organomegaly or masses felt. Normal bowel sounds heard. Central nervous system: Alert and oriented. No focal neurological deficits. Extremities: Symmetric 5 x 5 power. Skin: No rashes, lesions or ulcers Psychiatry: Judgement and insight appear fair. Mood & affect appropriate.     Data Reviewed: I have personally reviewed following labs and imaging studies  CBC: Recent Labs  Lab 12/20/16 0931 12/21/16 2233 12/22/16 0357 12/23/16 0405 12/24/16 0405  WBC 0.7* 0.8* 1.1* 3.9* 10.8*  NEUTROABS 0.3* 0.2* 0.3* 2.1 9.1*  HGB 11.6* 11.8* 10.1* 10.3* 10.9*  HCT 34.8* 34.6* 30.1* 30.0* 32.5*  MCV 91.8 90.6 90.9 91.2 91.5  PLT 82* 85* 76* 70* 92*   Basic Metabolic Panel: Recent Labs  Lab 12/20/16 0931 12/21/16 2233 12/22/16 0357 12/23/16 0405 12/24/16 0405  NA 136 134* 135 137 137  K 4.4 4.0 3.6 3.2* 4.0  CL  --  102 108 113* 114*  CO2 22 23 21* 20* 17*  GLUCOSE 158* 183* 139* 79 74  BUN 26.3* 24* 22* 21* 18  CREATININE 0.9 1.15 0.86 0.93 0.93  CALCIUM  8.6 8.7* 7.6* 7.5* 7.6*  MG  --   --   --  1.4* 2.0   GFR: Estimated Creatinine Clearance: 64.1 mL/min (by C-G formula based on SCr of 0.93 mg/dL). Liver Function Tests: Recent Labs  Lab 12/20/16 0931 12/21/16 2233 12/23/16 0832  AST 19 24 23   ALT 19 19 17   ALKPHOS 60 62 52  BILITOT 0.40 0.7 0.8  PROT 5.9* 6.2* 5.3*  ALBUMIN 2.6* 2.9* 2.3*   No results for input(s): LIPASE, AMYLASE in the last 168 hours. No results for input(s): AMMONIA in the last 168 hours. Coagulation Profile: Recent Labs  Lab 12/21/16 2233  INR 1.05   Cardiac Enzymes: No results for input(s): CKTOTAL, CKMB, CKMBINDEX, TROPONINI in the last 168 hours. BNP (last 3 results) No results for input(s): PROBNP in the last 8760 hours. HbA1C: No results for input(s): HGBA1C in the last 72 hours. CBG: Recent Labs  Lab 12/23/16 0749 12/23/16 1301 12/23/16 1726 12/23/16 2104 12/24/16 0744  GLUCAP 71 103* 103* 95 71  Lipid Profile: No results for input(s): CHOL, HDL, LDLCALC, TRIG, CHOLHDL, LDLDIRECT in the last 72 hours. Thyroid Function Tests: No results for input(s): TSH, T4TOTAL, FREET4, T3FREE, THYROIDAB in the last 72 hours. Anemia Panel: Recent Labs    12/23/16 0832  VITAMINB12 904  FOLATE 34.0  FERRITIN 255  TIBC 167*  IRON 25*  RETICCTPCT 1.4   Sepsis Labs: Recent Labs  Lab 12/21/16 2233 12/21/16 2244  PROCALCITON 0.21  --   LATICACIDVEN  --  1.49    Recent Results (from the past 240 hour(s))  Culture, blood (Routine x 2)     Status: Abnormal   Collection Time: 12/21/16 10:33 PM  Result Value Ref Range Status   Specimen Description BLOOD RIGHT ANTECUBITAL  Final   Special Requests   Final    BOTTLES DRAWN AEROBIC AND ANAEROBIC Blood Culture adequate volume   Culture  Setup Time   Final    GRAM POSITIVE COCCI IN CLUSTERS AEROBIC BOTTLE ONLY CRITICAL RESULT CALLED TO, READ BACK BY AND VERIFIED WITH: PHARMD B GREEN [id number] MLM    Culture (A)  Final    STAPHYLOCOCCUS  SPECIES (COAGULASE NEGATIVE) THE SIGNIFICANCE OF ISOLATING THIS ORGANISM FROM A SINGLE SET OF BLOOD CULTURES WHEN MULTIPLE SETS ARE DRAWN IS UNCERTAIN. PLEASE NOTIFY THE MICROBIOLOGY DEPARTMENT WITHIN ONE WEEK IF SPECIATION AND SENSITIVITIES ARE REQUIRED. Performed at [facility], [place] [address], [place], [place] [address]    Report Status 12/24/2016 FINAL  Final  Blood Culture ID Panel (Reflexed)     Status: Abnormal   Collection Time: 12/21/16 10:33 PM  Result Value Ref Range Status   Enterococcus species NOT DETECTED NOT DETECTED Final   Listeria monocytogenes NOT DETECTED NOT DETECTED Final   Staphylococcus species DETECTED (A) NOT DETECTED Final    Comment: Methicillin (oxacillin) resistant coagulase negative staphylococcus. Possible blood culture contaminant (unless isolated from more than one blood culture draw or clinical case suggests pathogenicity). No antibiotic treatment is indicated for blood  culture contaminants. CRITICAL RESULT CALLED TO, READ BACK BY AND VERIFIED WITH: PHARMD B GREEN [id number] MLM    Staphylococcus aureus NOT DETECTED NOT DETECTED Final   Methicillin resistance DETECTED (A) NOT DETECTED Final    Comment: CRITICAL RESULT CALLED TO, READ BACK BY AND VERIFIED WITH: PHARMD B GREEN [id number] MLM    Streptococcus species NOT DETECTED NOT DETECTED Final   Streptococcus agalactiae NOT DETECTED NOT DETECTED Final   Streptococcus pneumoniae NOT DETECTED NOT DETECTED Final   Streptococcus pyogenes NOT DETECTED NOT DETECTED Final   Acinetobacter baumannii NOT DETECTED NOT DETECTED Final   Enterobacteriaceae species NOT DETECTED NOT DETECTED Final   Enterobacter cloacae complex NOT DETECTED NOT DETECTED Final   Escherichia coli NOT DETECTED NOT DETECTED Final   Klebsiella oxytoca NOT DETECTED NOT DETECTED Final   Klebsiella pneumoniae NOT DETECTED NOT DETECTED Final   Proteus species NOT DETECTED NOT DETECTED Final   Serratia marcescens NOT DETECTED NOT  DETECTED Final   Haemophilus influenzae NOT DETECTED NOT DETECTED Final   Neisseria meningitidis NOT DETECTED NOT DETECTED Final   Pseudomonas aeruginosa NOT DETECTED NOT DETECTED Final   Candida albicans NOT DETECTED NOT DETECTED Final   Candida glabrata NOT DETECTED NOT DETECTED Final   Candida krusei NOT DETECTED NOT DETECTED Final   Candida parapsilosis NOT DETECTED NOT DETECTED Final   Candida tropicalis NOT DETECTED NOT DETECTED Final    Comment: Performed at [facility], [place]. [address] [place], [place] [address]  Culture, blood (Routine x 2)     Status: None (Preliminary result)   Collection Time: 12/21/16 11:05 PM  Result Value Ref Range Status   Specimen Description BLOOD LEFT ANTECUBITAL  Final   Special Requests   Final    BOTTLES DRAWN AEROBIC AND ANAEROBIC Blood Culture adequate volume   Culture   Final    NO GROWTH 1 DAY Performed at [facility], [address] [place], [place] [address]    Report Status PENDING  Incomplete  Urine culture     Status: None   Collection Time: 12/22/16  6:03 AM  Result Value Ref Range Status   Specimen Description URINE, CLEAN CATCH  Final   Special Requests NONE  Final   Culture   Final    NO GROWTH Performed at [facility], [address] [place], [place] [address]    Report Status 12/23/2016 FINAL  Final  Culture, sputum-assessment     Status: None   Collection Time: 12/22/16  9:50 AM  Result Value Ref Range Status   Specimen Description SPUTUM  Final   Special Requests NONE  Final   Sputum evaluation THIS SPECIMEN IS ACCEPTABLE FOR SPUTUM CULTURE  Final   Report Status 12/22/2016 FINAL  Final  Culture, respiratory (NON-Expectorated)     Status: None   Collection Time: 12/22/16  9:50 AM  Result Value Ref Range Status   Specimen Description SPUTUM  Final   Special Requests NONE Reflexed from [id number]  Final   Gram Stain   Final    MODERATE WBC PRESENT, PREDOMINANTLY MONONUCLEAR NO ORGANISMS SEEN     Culture   Final    Consistent with normal respiratory flora. Performed at [facility], [place] [address], [place], [place] [address]    Report Status 12/24/2016 FINAL  Final  Culture, body fluid-bottle     Status: None (Preliminary result)   Collection Time: 12/22/16  5:04 PM  Result Value Ref Range Status   Specimen Description FLUID LEFT PLEURAL  Final   Special Requests BOTTLES DRAWN AEROBIC AND ANAEROBIC 10CC  Final   Culture   Final    NO GROWTH < 24 HOURS Performed at [facility], [place] [address], [place], [place] [address]    Report Status PENDING  Incomplete  Gram stain     Status: None   Collection Time: 12/22/16  5:04 PM  Result Value Ref Range Status   Specimen Description FLUID LEFT PLEURAL  Final   Special Requests NONE  Final   Gram Stain   Final    RARE WBC PRESENT, PREDOMINANTLY MONONUCLEAR NO ORGANISMS SEEN Performed at [facility], [place] [address] [place], [place] [address]    Report Status 12/23/2016 FINAL  Final         Radiology Studies: Dg Chest 1 View  Result Date: 12/22/2016 CLINICAL DATA:  Status post LEFT thoracentesis EXAM: CHEST 1 VIEW COMPARISON:  None. FINDINGS: Reduction in LEFT pleural effusion lung thoracentesis. Pneumothorax on LEFT. RIGHT prior port noted. IMPRESSION: Reduction pleural fluid following LEFT thoracentesis. No pneumothorax appreciated. Electronically Signed   By: Suzy Bouchard M.D.   On: 12/22/2016 17:16   US Thoracentesis Asp Pleural Space W/img Guide  Result Date: 12/22/2016 INDICATION: Patient with history of pneumonia, squamous cell carcinoma lung, left pleural effusion, dyspnea. Request made for diagnostic and therapeutic left thoracentesis. EXAM: ULTRASOUND GUIDED DIAGNOSTIC AND THERAPEUTIC LEFT THORACENTESIS MEDICATIONS: None. COMPLICATIONS: None immediate. PROCEDURE: An ultrasound guided thoracentesis  was thoroughly discussed with the patient and questions answered. The benefits, risks, alternatives  and complications were also discussed. The patient understands and wishes to proceed with the procedure. Written consent was obtained. Ultrasound was performed to localize and mark an adequate pocket of fluid in the left chest. The area was then prepped and draped in the normal sterile fashion. 1% Lidocaine was used for local anesthesia. Under ultrasound guidance a Safe-T-Centesis catheter was introduced. Thoracentesis was performed. The catheter was removed and a dressing applied. FINDINGS: A total of approximately 1.5 liters of yellow fluid was removed. Samples were sent to the laboratory as requested by the clinical team. IMPRESSION: Successful ultrasound guided diagnostic and therapeutic left thoracentesis yielding 1.5 liters of pleural fluid. Read by: Rowe Robert, PA-C Electronically Signed   By: Jerilynn Mages.  Shick M.D.   On: 12/22/2016 16:50        Scheduled Meds: . gabapentin  400 mg Oral QHS  . guaiFENesin  1,200 mg Oral BID  . insulin aspart  0-15 Units Subcutaneous TID WC  . insulin aspart  0-5 Units Subcutaneous QHS  . insulin glargine  15 Units Subcutaneous QHS  . ipratropium-albuterol  3 mL Nebulization BID  . mirtazapine  15 mg Oral QHS  . neomycin-bacitracin-polymyxin  1 application Topical QHS  . pantoprazole  40 mg Oral QHS  . rosuvastatin  20 mg Oral Daily  . sodium chloride flush  3 mL Intravenous Q12H  . sucralfate  1 g Oral TID WC & HS  . triamcinolone  1 application Topical BID   Continuous Infusions: . sodium chloride    . sodium chloride 75 mL/hr at 12/24/16 0934  . ceFEPime (MAXIPIME) IV Stopped (12/24/16 0654)  . vancomycin Stopped (12/23/16 1600)     LOS: 2 days    Time spent: 35 minutes    Irine Seal, MD [facility] Pager [phone]  If 7PM-7AM, please contact night-coverage [email or web address] Password [email or web address] 12/24/2016, 11:36 AM

## 2016-12-24 NOTE — Progress Notes (Signed)
Pt refuses cpt

## 2016-12-25 ENCOUNTER — Ambulatory Visit
Admission: RE | Admit: 2016-12-25 | Discharge: 2016-12-25 | Disposition: A | Discharge status: Home / Self Care | Payer: Medicare Other | Source: Ambulatory Visit | Attending: Radiation Oncology | Admitting: Radiation Oncology

## 2016-12-25 LAB — BASIC METABOLIC PANEL
Anion gap: 5 (ref 5–15)
BUN: 13 mg/dL (ref 6–20)
CO2: 20 mmol/L — AB (ref 22–32)
Calcium: 7.6 mg/dL — ABNORMAL LOW (ref 8.9–10.3)
Chloride: 111 mmol/L (ref 101–111)
Creatinine, Ser: 0.89 mg/dL (ref 0.61–1.24)
GFR calc Af Amer: >60 mL/min (ref >60)
GLUCOSE: 118 mg/dL — AB (ref 65–99)
POTASSIUM: 3.4 mmol/L — AB (ref 3.5–5.1)
Sodium: 136 mmol/L (ref 135–145)

## 2016-12-25 LAB — CBC WITH DIFFERENTIAL/PLATELET
BASOS PCT: 0 %
Band Neutrophils: 56 %
Basophils Absolute: 0 10*3/uL (ref 0.0–0.1)
Blasts: 0 %
EOS PCT: 4 %
Eosinophils Absolute: 0.5 10*3/uL (ref 0.0–0.7)
HEMATOCRIT: 30.7 % — AB (ref 39.0–52.0)
Hemoglobin: 10.4 g/dL — ABNORMAL LOW (ref 13.0–17.0)
LYMPHS ABS: 0.3 10*3/uL — AB (ref 0.7–4.0)
LYMPHS PCT: 2 %
MCH: 30.8 pg (ref 26.0–34.0)
MCHC: 33.9 g/dL (ref 30.0–36.0)
MCV: 90.8 fL (ref 78.0–100.0)
MONO ABS: 0.8 10*3/uL (ref 0.1–1.0)
Metamyelocytes Relative: 0 %
Monocytes Relative: 6 %
Myelocytes: 0 %
NEUTROS PCT: 31 %
NRBC: 0 /100{WBCs}
Neutro Abs: 11.2 10*3/uL — ABNORMAL HIGH (ref 1.7–7.7)
OTHER: 0 %
PLATELETS: 106 10*3/uL — AB (ref 150–400)
PROMYELOCYTES ABS: 1 %
RBC: 3.38 MIL/uL — ABNORMAL LOW (ref 4.22–5.81)
RDW: 15.1 % (ref 11.5–15.5)
WBC MORPHOLOGY: INCREASED
WBC: 12.8 10*3/uL — ABNORMAL HIGH (ref 4.0–10.5)

## 2016-12-25 LAB — GLUCOSE, CAPILLARY
GLUCOSE-CAPILLARY: 155 mg/dL — AB (ref 65–99)
GLUCOSE-CAPILLARY: 83 mg/dL (ref 65–99)
Glucose-Capillary: 131 mg/dL — ABNORMAL HIGH (ref 65–99)
Glucose-Capillary: 89 mg/dL (ref 65–99)
Glucose-Capillary: 105 mg/dL — ABNORMAL HIGH (ref 65–99)

## 2016-12-25 LAB — CHOLESTEROL, BODY FLUID: Cholesterol, Fluid: 58 mg/dL

## 2016-12-25 MED ORDER — MAGNESIUM HYDROXIDE 400 MG/5ML PO SUSP
30.0000 mL | Freq: Every day | ORAL | Status: DC
  Administered 2016-12-25: 30 mL via ORAL
  Filled 2016-12-25: qty 30

## 2016-12-25 MED ORDER — SENNOSIDES-DOCUSATE SODIUM 8.6-50 MG PO TABS
1.0000 | ORAL_TABLET | Freq: Two times a day (BID) | ORAL | Status: DC
  Administered 2016-12-25 (×2): 1 via ORAL
  Filled 2016-12-25 (×2): qty 1

## 2016-12-25 MED ORDER — AMOXICILLIN-POT CLAVULANATE 875-125 MG PO TABS
1.0000 | ORAL_TABLET | Freq: Two times a day (BID) | ORAL | Status: DC
  Administered 2016-12-25 – 2016-12-26 (×2): 1 via ORAL
  Filled 2016-12-25 (×2): qty 1

## 2016-12-25 MED ORDER — POLYETHYLENE GLYCOL 3350 17 G PO PACK: 17.0000 g | PACK | Freq: Every day | ORAL | Status: DC | PRN

## 2016-12-25 MED ORDER — POTASSIUM CHLORIDE CRYS ER 20 MEQ PO TBCR
40.0000 meq | EXTENDED_RELEASE_TABLET | Freq: Once | ORAL | Status: AC
  Administered 2016-12-25: 40 meq via ORAL
  Filled 2016-12-25: qty 2

## 2016-12-25 NOTE — Progress Notes (Signed)
This Probation officer was with critical patients and unavailable for [facility].

## 2016-12-25 NOTE — Progress Notes (Signed)
PROGRESS NOTE    BARAA TUBBS  PYP:[id number] DOB: [birth year] DOA: 12/21/2016 PCP: [name], MD   Brief Narrative:  Steve Frank is a 83 y.o. male with medical history significant of lung cancer, htn comes in with fever , coughing for several days.  Fever was noted today by wife.  He has been having problems with low wbc with his chemo.  No n/v/d.  No urinary symptoms.  Pt found to have pna and neutropenic.  Referred for admission for both.  Initial bp in triage was very low, this has been deemed to be errors.  bp is normal.     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  Patient has been pancultured and results pending.  Currently afebrile.  Initial blood cultures concerning for coagulase-negative staph which is likely a contaminant.  Repeat blood cultures pending. Urinalysis nitrite negative leukocytes -0-5 WBCs.  Urine strep pneumococcus antigen is negative.  Fever curve trending down.  Neutropenia resolved. Continue Mucinex twice daily,scheduled duo nebs.  Discontinue IV vancomycin and IV cefepime.  Please on oral Augmentin to complete course of antibiotic treatment.  Follow.  2.  Pneumonia Patient presented with fevers, cough, recent chemotherapy chest x-ray consistent with a left lingular and left basilar consolidation/infiltrate.  Patient also noted to have a left pleural effusion.  Fever curve trending down.  Neutropenia improved.  Urine strep pneumococcus  antigen negative.  Patient has been pancultured results pending.  Continue Mucinex 1200 mg twice daily, scheduled duo nebs, change empiric IV vancomycin and IV cefepime to oral Augmentin to complete course of treatment.  Patient refusing chest physiotherapy.  3.  Left exudative pleural effusion The patient had presented with shortness of breath, cough, fevers noted to have a left lingula and left lower lobe pneumonia per chest x-ray.  Left pleural effusion also noted.  Patient underwent ultrasound-guided therapeutic and diagnostic thoracentesis.  1.5 L of fluid was removed.  By light's criteria exudative pleural effusion.  Gram stain and cultures pending with no growth to date.  Cytology pending.  Continue empiric antibiotics.  Supportive care.  Follow.  4.  Pancytopenia Likely secondary to recent chemotherapy.  Counts improving. Patient was started on Granix ANC is now 12.8.  Granix discontinued after 1 dose.  Patient with no bleeding.  H&H stable.  Follow.   5.  Diabetes mellitus II Hemoglobin A1c was 6.8 on 10/14/2016.  CBGs have ranged from 91-155.  Continue sliding scale insulin and Lantus.  Supportive care.    6.  Gastroesophageal reflux disease Continue PPI.  7.  COPD Stable.  #8 squamous cell carcinoma of the left lung stage IV Patient states his oncologist came by to visit on 12/22/2016, Dr. Lorna Few.  Continue current care.  Outpatient follow-up.  Per oncology.  #9 ??  Bacteremia/coagulase-negative staph Repeat blood cultures pending with no growth to date.  Likely contamination.  Transition from IV antibiotics to oral Augmentin. Follow.   DVT prophylaxis: SCDs  Code Status: Full Family Communication: Updated patient and family at bedside. Disposition Plan: Likely home with home health versus skilled nursing facility hopefully tomorrow.   Consultants:   Oncology notified via epic: Dr. Lorna Few  Procedures:   Chest x-ray 12/21/2016  Ultrasound-guided  diagnostic and therapeutic thoracentesis 12/22/2016  Antimicrobials:   IV cefepime 12/22/2016>>>>>> 12/25/2016  IV vancomycin 12/22/2016>>>>>> 12/25/2016  Oral Augmentin 12/25/2016   Subjective: Patient sitting up in bed. No significant  SOB per patient.  Patient denies any chest pain.  Patient stated ambulated in the hallway without any significant shortness of breath yesterday.  States no significant difference with shortness of breath or cough since admission although family feels he looks better.  No chest pain.    Objective: Vitals:   12/24/16 1240 12/24/16 2051 12/25/16 0545 12/25/16 0814  BP:  (!) 133/57 (!) 122/57   Pulse:  66 (!) 52   Resp:  18 19   Temp:  97.9 F (36.6 C) 97.8 F (36.6 C)   TempSrc:  Oral Oral   SpO2: 98% 97% 96% 95%  Weight:      Height:        Intake/Output Summary (Last 24 hours) at 12/25/2016 1324 Last data filed at 12/25/2016 1051 Gross per 24 hour  Intake 5202.5 ml  Output 700 ml  Net 4502.5 ml   Filed Weights   12/22/16 0556  Weight: 82 kg (180 lb 12.4 oz)    Examination:  General exam: NAD Respiratory system: CTAB.  No wheezing.  No crackles.  Respiratory effort normal. Cardiovascular system: S1 & S2 heard, RRR with 3/6 SEM. No JVD, murmurs, rubs, gallops or clicks. No pedal edema. Gastrointestinal system: Abdomen is nondistended, soft and nontender. No organomegaly or masses felt. Normal bowel sounds heard. Central nervous system: Alert and oriented. No focal neurological deficits. Extremities: Symmetric 5 x 5 power. Skin: No rashes, lesions or ulcers Psychiatry: Judgement and insight appear fair. Mood & affect appropriate.     Data Reviewed: I have personally reviewed following labs and imaging studies  CBC: Recent Labs  Lab 12/21/16 2233 12/22/16 0357 12/23/16 0405 12/24/16 0405 12/25/16 0500  WBC 0.8* 1.1* 3.9* 10.8* 12.8*  NEUTROABS 0.2* 0.3* 2.1 9.1* 11.2*  HGB 11.8* 10.1* 10.3* 10.9* 10.4*  HCT 34.6* 30.1*  30.0* 32.5* 30.7*  MCV 90.6 90.9 91.2 91.5 90.8  PLT 85* 76* 70* 92* 294*   Basic Metabolic Panel: Recent Labs  Lab 12/21/16 2233 12/22/16 0357 12/23/16 0405 12/24/16 0405 12/25/16 0500  NA 134* 135 137 137 136  K 4.0 3.6 3.2* 4.0 3.4*  CL 102 108 113* 114* 111  CO2 23 21* 20* 17* 20*  GLUCOSE 183* 139* 79 74 118*  BUN 24* 22* 21* 18 13  CREATININE 1.15 0.86 0.93 0.93 0.89  CALCIUM 8.7* 7.6* 7.5* 7.6* 7.6*  MG  --   --  1.4* 2.0  --    GFR: Estimated Creatinine Clearance: 67 mL/min (by C-G formula based on SCr of 0.89 mg/dL). Liver Function Tests: Recent Labs  Lab 12/20/16 0931 12/21/16 2233 12/23/16 0832  AST 19 24 23   ALT 19 19 17   ALKPHOS 60 62 52  BILITOT 0.40 0.7 0.8  PROT 5.9* 6.2* 5.3*  ALBUMIN 2.6* 2.9* 2.3*   No results for input(s): LIPASE, AMYLASE in the last 168 hours. No results for input(s): AMMONIA in the last 168 hours. Coagulation Profile: Recent Labs  Lab 12/21/16 2233  INR 1.05   Cardiac Enzymes: No results for input(s):  CKTOTAL, CKMB, CKMBINDEX, TROPONINI in the last 168 hours. BNP (last 3 results) No results for input(s): PROBNP in the last 8760 hours. HbA1C: No results for input(s): HGBA1C in the last 72 hours. CBG: Recent Labs  Lab 12/24/16 1140 12/24/16 1636 12/24/16 2051 12/25/16 0716 12/25/16 1127  GLUCAP 92 137* 91 131* 155*   Lipid Profile: No results for input(s): CHOL, HDL, LDLCALC, TRIG, CHOLHDL, LDLDIRECT in the last 72 hours. Thyroid Function Tests: No results for input(s): TSH, T4TOTAL, FREET4, T3FREE, THYROIDAB in the last 72 hours. Anemia Panel: Recent Labs    12/23/16 0832  VITAMINB12 904  FOLATE 34.0  FERRITIN 255  TIBC 167*  IRON 25*  RETICCTPCT 1.4   Sepsis Labs: Recent Labs  Lab 12/21/16 2233 12/21/16 2244  PROCALCITON 0.21  --   LATICACIDVEN  --  1.49    Recent Results (from the past 240 hour(s))  Culture, blood (Routine x 2)     Status: Abnormal   Collection Time: 12/21/16 10:33 PM    Result Value Ref Range Status   Specimen Description BLOOD RIGHT ANTECUBITAL  Final   Special Requests   Final    BOTTLES DRAWN AEROBIC AND ANAEROBIC Blood Culture adequate volume   Culture  Setup Time   Final    GRAM POSITIVE COCCI IN CLUSTERS AEROBIC BOTTLE ONLY CRITICAL RESULT CALLED TO, READ BACK BY AND VERIFIED WITH: PHARMD B GREEN [id number] MLM    Culture (A)  Final    STAPHYLOCOCCUS SPECIES (COAGULASE NEGATIVE) THE SIGNIFICANCE OF ISOLATING THIS ORGANISM FROM A SINGLE SET OF BLOOD CULTURES WHEN MULTIPLE SETS ARE DRAWN IS UNCERTAIN. PLEASE NOTIFY THE MICROBIOLOGY DEPARTMENT WITHIN ONE WEEK IF SPECIATION AND SENSITIVITIES ARE REQUIRED. Performed at [facility], [place] [address], [place], [place] [address]    Report Status 12/24/2016 FINAL  Final  Blood Culture ID Panel (Reflexed)     Status: Abnormal   Collection Time: 12/21/16 10:33 PM  Result Value Ref Range Status   Enterococcus species NOT DETECTED NOT DETECTED Final   Listeria monocytogenes NOT DETECTED NOT DETECTED Final   Staphylococcus species DETECTED (A) NOT DETECTED Final    Comment: Methicillin (oxacillin) resistant coagulase negative staphylococcus. Possible blood culture contaminant (unless isolated from more than one blood culture draw or clinical case suggests pathogenicity). No antibiotic treatment is indicated for blood  culture contaminants. CRITICAL RESULT CALLED TO, READ BACK BY AND VERIFIED WITH: PHARMD B GREEN [id number] MLM    Staphylococcus aureus NOT DETECTED NOT DETECTED Final   Methicillin resistance DETECTED (A) NOT DETECTED Final    Comment: CRITICAL RESULT CALLED TO, READ BACK BY AND VERIFIED WITH: PHARMD B GREEN [id number] MLM    Streptococcus species NOT DETECTED NOT DETECTED Final   Streptococcus agalactiae NOT DETECTED NOT DETECTED Final   Streptococcus pneumoniae NOT DETECTED NOT DETECTED Final   Streptococcus pyogenes NOT DETECTED NOT DETECTED Final   Acinetobacter baumannii NOT  DETECTED NOT DETECTED Final   Enterobacteriaceae species NOT DETECTED NOT DETECTED Final   Enterobacter cloacae complex NOT DETECTED NOT DETECTED Final   Escherichia coli NOT DETECTED NOT DETECTED Final   Klebsiella oxytoca NOT DETECTED NOT DETECTED Final   Klebsiella pneumoniae NOT DETECTED NOT DETECTED Final   Proteus species NOT DETECTED NOT DETECTED Final   Serratia marcescens NOT DETECTED NOT DETECTED Final   Haemophilus influenzae NOT DETECTED NOT DETECTED Final   Neisseria meningitidis NOT DETECTED NOT DETECTED Final   Pseudomonas aeruginosa NOT DETECTED NOT DETECTED Final   Candida  albicans NOT DETECTED NOT DETECTED Final   Candida glabrata NOT DETECTED NOT DETECTED Final   Candida krusei NOT DETECTED NOT DETECTED Final   Candida parapsilosis NOT DETECTED NOT DETECTED Final   Candida tropicalis NOT DETECTED NOT DETECTED Final    Comment: Performed at [facility], [place] [address], [place], [place] [address]  Culture, blood (Routine x 2)     Status: None (Preliminary result)   Collection Time: 12/21/16 11:05 PM  Result Value Ref Range Status   Specimen Description BLOOD LEFT ANTECUBITAL  Final   Special Requests   Final    BOTTLES DRAWN AEROBIC AND ANAEROBIC Blood Culture adequate volume   Culture   Final    NO GROWTH 3 DAYS Performed at [facility], [place] [address], [place], [place] [address]    Report Status PENDING  Incomplete  Urine culture     Status: None   Collection Time: 12/22/16  6:03 AM  Result Value Ref Range Status   Specimen Description URINE, CLEAN CATCH  Final   Special Requests NONE  Final   Culture   Final    NO GROWTH Performed at [facility], [address], [place], [place] [address]    Report Status 12/23/2016 FINAL  Final  Culture, sputum-assessment     Status: None   Collection Time: 12/22/16  9:50 AM  Result Value Ref Range Status   Specimen Description SPUTUM  Final   Special Requests NONE  Final   Sputum evaluation THIS  SPECIMEN IS ACCEPTABLE FOR SPUTUM CULTURE  Final   Report Status 12/22/2016 FINAL  Final  Culture, respiratory (NON-Expectorated)     Status: None   Collection Time: 12/22/16  9:50 AM  Result Value Ref Range Status   Specimen Description SPUTUM  Final   Special Requests NONE Reflexed from [id number]  Final   Gram Stain   Final    MODERATE WBC PRESENT, PREDOMINANTLY MONONUCLEAR NO ORGANISMS SEEN    Culture   Final    Consistent with normal respiratory flora. Performed at [facility], [place] [address], [place], [place] [address]    Report Status 12/24/2016 FINAL  Final  Culture, body fluid-bottle     Status: None (Preliminary result)   Collection Time: 12/22/16  5:04 PM  Result Value Ref Range Status   Specimen Description FLUID LEFT PLEURAL  Final   Special Requests BOTTLES DRAWN AEROBIC AND ANAEROBIC 10CC  Final   Culture   Final    NO GROWTH 3 DAYS Performed at [facility], [place] [address] [place], [place] [address]    Report Status PENDING  Incomplete  Gram stain     Status: None   Collection Time: 12/22/16  5:04 PM  Result Value Ref Range Status   Specimen Description FLUID LEFT PLEURAL  Final   Special Requests NONE  Final   Gram Stain   Final    RARE WBC PRESENT, PREDOMINANTLY MONONUCLEAR NO ORGANISMS SEEN Performed at [facility], [place] [address], [place], [place] [address]    Report Status 12/23/2016 FINAL  Final  Culture, blood (Routine X 2) w Reflex to ID Panel     Status: None (Preliminary result)   Collection Time: 12/23/16  3:03 PM  Result Value Ref Range Status   Specimen Description BLOOD LEFT HAND  Final   Special Requests   Final    BOTTLES DRAWN AEROBIC AND ANAEROBIC Blood Culture adequate volume   Culture  Final    NO GROWTH 2 DAYS Performed at [facility], [place] [address] [place], [place] [address]    Report Status PENDING  Incomplete  Culture, blood (Routine X 2) w Reflex to ID Panel     Status: None (Preliminary result)    Collection Time: 12/23/16  3:07 PM  Result Value Ref Range Status   Specimen Description BLOOD RIGHT ANTECUBITAL  Final   Special Requests   Final    BOTTLES DRAWN AEROBIC AND ANAEROBIC Blood Culture adequate volume   Culture   Final    NO GROWTH 2 DAYS Performed at [facility], [place] [address] [place], [place] [address]    Report Status PENDING  Incomplete         Radiology Studies: No results found.      Scheduled Meds: . gabapentin  400 mg Oral QHS  . guaiFENesin  1,200 mg Oral BID  . insulin aspart  0-15 Units Subcutaneous TID WC  . insulin aspart  0-5 Units Subcutaneous QHS  . insulin glargine  15 Units Subcutaneous QHS  . ipratropium-albuterol  3 mL Nebulization BID  . magnesium hydroxide  30 mL Oral Daily  . mirtazapine  15 mg Oral QHS  . neomycin-bacitracin-polymyxin  1 application Topical QHS  . pantoprazole  40 mg Oral QHS  . rosuvastatin  20 mg Oral Daily  . senna-docusate  1 tablet Oral BID  . sodium chloride flush  3 mL Intravenous Q12H  . sucralfate  1 g Oral TID WC & HS  . triamcinolone  1 application Topical BID   Continuous Infusions: . sodium chloride    . ceFEPime (MAXIPIME) IV 2 g (12/25/16 0553)     LOS: 3 days    Time spent: 35 minutes    Irine Seal, MD [facility] Pager [phone]  If 7PM-7AM, please contact night-coverage [email or web address] Password [email or web address] 12/25/2016, 1:24 PM

## 2016-12-26 ENCOUNTER — Other Ambulatory Visit: Payer: Medicare Other

## 2016-12-26 ENCOUNTER — Ambulatory Visit
Admission: RE | Admit: 2016-12-26 | Discharge: 2016-12-26 | Disposition: A | Discharge status: Home / Self Care | Payer: Medicare Other | Source: Ambulatory Visit | Attending: Radiation Oncology | Admitting: Radiation Oncology

## 2016-12-26 LAB — BASIC METABOLIC PANEL
Anion gap: 7 (ref 5–15)
BUN: 10 mg/dL (ref 6–20)
CHLORIDE: 112 mmol/L — AB (ref 101–111)
CO2: 20 mmol/L — AB (ref 22–32)
CREATININE: 0.82 mg/dL (ref 0.61–1.24)
Calcium: 8.4 mg/dL — ABNORMAL LOW (ref 8.9–10.3)
GFR calc Af Amer: >60 mL/min (ref >60)
GFR calc non Af Amer: >60 mL/min (ref >60)
GLUCOSE: 139 mg/dL — AB (ref 65–99)
POTASSIUM: 3.9 mmol/L (ref 3.5–5.1)
Sodium: 139 mmol/L (ref 135–145)

## 2016-12-26 LAB — CBC WITH DIFFERENTIAL/PLATELET
Basophils Absolute: 0 10*3/uL (ref 0.0–0.1)
Basophils Relative: 0 %
EOS ABS: 0.4 10*3/uL (ref 0.0–0.7)
EOS PCT: 5 %
HCT: 33.6 % — ABNORMAL LOW (ref 39.0–52.0)
Hemoglobin: 11.3 g/dL — ABNORMAL LOW (ref 13.0–17.0)
LYMPHS ABS: 0.7 10*3/uL (ref 0.7–4.0)
LYMPHS PCT: 8 %
MCH: 30.4 pg (ref 26.0–34.0)
MCHC: 33.6 g/dL (ref 30.0–36.0)
MCV: 90.3 fL (ref 78.0–100.0)
MONOS PCT: 9 %
Monocytes Absolute: 0.8 10*3/uL (ref 0.1–1.0)
Neutro Abs: 7.1 10*3/uL (ref 1.7–7.7)
Neutrophils Relative %: 79 %
PLATELETS: 105 10*3/uL — AB (ref 150–400)
RBC: 3.72 MIL/uL — AB (ref 4.22–5.81)
RDW: 15 % (ref 11.5–15.5)
WBC: 9 10*3/uL (ref 4.0–10.5)

## 2016-12-26 LAB — GLUCOSE, CAPILLARY
Glucose-Capillary: 119 mg/dL — ABNORMAL HIGH (ref 65–99)
Glucose-Capillary: 139 mg/dL — ABNORMAL HIGH (ref 65–99)

## 2016-12-26 MED ORDER — HYDROCODONE-HOMATROPINE 5-1.5 MG/5ML PO SYRP: 5.0000 mL | ORAL_SOLUTION | Freq: Four times a day (QID) | ORAL | 0 refills | Status: DC | PRN

## 2016-12-26 MED ORDER — GUAIFENESIN ER 600 MG PO TB12: 1200.0000 mg | ORAL_TABLET | Freq: Two times a day (BID) | ORAL | 0 refills | Status: AC

## 2016-12-26 MED ORDER — AMOXICILLIN-POT CLAVULANATE 875-125 MG PO TABS: 1.0000 | ORAL_TABLET | Freq: Two times a day (BID) | ORAL | 0 refills | Status: AC

## 2016-12-26 MED ORDER — HEPARIN SOD (PORK) LOCK FLUSH 100 UNIT/ML IV SOLN
500.0000 [IU] | INTRAVENOUS | Status: DC | PRN
  Filled 2016-12-26: qty 5

## 2016-12-26 NOTE — Care Management Note (Signed)
Case Management Note  Patient Details  Name: Steve Frank MRN: [id number] Date of Birth: [birth year]  Subjective/Objective:      83 yo admitted for Neutropenic fever. Medical history significant oflung cancer, htn    Action/Plan: This CM met with pt and wife at bedside for DC planning. Pt would like to dc home with home health services. Choice offered and [facility] chosen. [facility] rep contacted for referral. Pt has rolling walker and shower chair at home.  Expected Discharge Date:                  Expected Discharge Plan:  [place]  In-House Referral:     Discharge planning Services  CM Consult  Post Acute Care Choice:  Home Health Choice offered to:  Patient, Spouse  DME Arranged:    DME Agency:     HH Arranged:  PT, OT, Social Work [employer] Agency:  [facility]  Status of Service:  Completed, signed off  If discussed at [employer], dates discussed:    Additional Comments:  Steve Catalan, RN 12/26/2016, 11:51 AM

## 2016-12-26 NOTE — Care Management Important Message (Signed)
Important Message  Patient Details  Name: Steve Frank MRN: [id number] Date of Birth: [birth year]   Medicare Important Message Given:  Yes    Kerin Salen 12/26/2016, 11:25 AMImportant Message  Patient Details  Name: Steve Frank MRN: [id number] Date of Birth: [birth year]   Medicare Important Message Given:  Yes    Kerin Salen 12/26/2016, 11:25 AM

## 2016-12-26 NOTE — Progress Notes (Signed)
Physical Therapy Treatment Patient Details Name: Steve Frank MRN: [id number] DOB: 12-Nov-1931 Today's Date: 12/26/2016    History of Present Illness Steve Frank is a 83 y.o. male with medical history significant of lung cancer, HTN, CAD, cerebellar mass; pt adm with fever, cough    PT Comments    Ambulated 40 feet with min guard from spouse. Educated pt and spouse on transferring sit to stand safely to allow patient more functional independence and promote self management. Patient displayed no LOB with spouse guarding but required VC's to decrease gait speed and increase his BOS for safety. Patient reported his son was building a longer handrail in his house to allow him to support himself while ascending/descending 1 step into house.    Follow Up Recommendations  Home health PT;Supervision/Assistance - 24 hour     Equipment Recommendations  None recommended by PT    Recommendations for Other Services       Precautions / Restrictions Precautions Precautions: Fall Precaution Comments: ataxic, reports multiple falls at home Restrictions Weight Bearing Restrictions: No    Mobility  Bed Mobility Overal bed mobility: Needs Assistance Bed Mobility: Supine to Sit     Supine to sit: Supervision;HOB elevated     General bed mobility comments: OOB  Transfers Overall transfer level: Needs assistance Equipment used: Rolling walker (2 wheeled) Transfers: Sit to/from Stand Sit to Stand: Min guard;Supervision         General transfer comment: 25% VC's for hand placement, educated wife on safety and assisting as caregiver  Ambulation/Gait Ambulation/Gait assistance: Min guard Ambulation Distance (Feet): 40 Feet Assistive device: Rolling walker (2 wheeled) Gait Pattern/deviations: Decreased step length - left;Decreased step length - right;Staggering left;Trunk flexed;Narrow base of support;Step-through pattern;Step-to pattern;Ataxic Gait velocity: decreased Gait  velocity interpretation: Below normal speed for age/gender General Gait Details: unsteady gait, VC's for increasing BOS and reducing gait speed for safety.    Stairs Stairs: Yes   Stair Management: One rail Right;Forwards;Backwards;Step to pattern Number of Stairs: 1 General stair comments: educated spouse of safety and assistance with 1 step. Patient able to use railing on right and noted son was building an extended hand rail in the house.   Wheelchair Mobility    Modified Rankin (Stroke Patients Only)       Balance Overall balance assessment: Needs assistance;History of Falls   Sitting balance-Leahy Scale: Fair     Standing balance support: During functional activity;Bilateral upper extremity supported;Single extremity supported Standing balance-Leahy Scale: Poor                              Cognition Arousal/Alertness: Awake/alert Behavior During Therapy: WFL for tasks assessed/performed Overall Cognitive Status: Within Functional Limits for tasks assessed                                        Exercises      General Comments        Pertinent Vitals/Pain Pain Assessment: No/denies pain    Home Living                      Prior Function            PT Goals (current goals can now be found in the care plan section) Acute Rehab PT Goals Patient Stated Goal: go home    Frequency  Min 3X/week      PT Plan Current plan remains appropriate    Co-evaluation              AM-PAC PT "6 Clicks" Daily Activity  Outcome Measure  Difficulty turning over in bed (including adjusting bedclothes, sheets and blankets)?: A Little Difficulty moving from lying on back to sitting on the side of the bed? : A Lot Difficulty sitting down on and standing up from a chair with arms (e.g., wheelchair, bedside commode, etc,.)?: A Lot Help needed moving to and from a bed to chair (including a wheelchair)?: A Little Help needed  walking in hospital room?: A Lot Help needed climbing 3-5 steps with a railing? : A Lot 6 Click Score: 14    End of Session Equipment Utilized During Treatment: Gait belt Activity Tolerance: Patient tolerated treatment well Patient left: in bed;with call bell/phone within reach;with family/visitor present Nurse Communication: Mobility status PT Visit Diagnosis: Unsteadiness on feet (R26.81);Ataxic gait (R26.0)     Time: 7591-6384 PT Time Calculation (min) (ACUTE ONLY): 18 min  Charges:                       G Codes:       Almond Lint, SPTA [place] [facility] [place]  PTA [facility]  Acute  Rehab Pager      [phone]

## 2016-12-26 NOTE — Progress Notes (Signed)
Occupational Therapy Treatment Patient Details Name: Steve Frank MRN: [id number] DOB: [birth year] Today's Date: 12/26/2016    History of present illness Steve Frank is a 83 y.o. male with medical history significant of lung cancer, HTN, CAD, cerebellar mass; pt adm with fever, cough   OT comments  Pt making progress with functional goals. Pt does no twant short vtern SNF stay and pt's wife now agreeable for pt to return home stating "we'll try this, but he has to start dressing himself and doing more". Pt reports that it's easier when his wife dresses him than for him to do it. Encouraged pt to participate in more of his ADLs and self care and increase his daily activity at home  Follow Up Recommendations  Supervision/Assistance - 24 hour;Home health OT(pt would like pt to start dressing himself and use A/E)    Equipment Recommendations       Recommendations for Other Services      Precautions / Restrictions Precautions Precautions: Fall Precaution Comments: ataxic, reports multiple falls at home Restrictions Weight Bearing Restrictions: No       Mobility Bed Mobility Overal bed mobility: Needs Assistance Bed Mobility: Supine to Sit     Supine to sit: Supervision;HOB elevated     General bed mobility comments: used rails with increased time  Transfers Overall transfer level: Needs assistance Equipment used: Rolling walker (2 wheeled) Transfers: Sit to/from Stand Sit to Stand: Min assist;Min guard         General transfer comment: cues for UE placement    Balance Overall balance assessment: Needs assistance;History of Falls   Sitting balance-Leahy Scale: Fair     Standing balance support: During functional activity;Bilateral upper extremity supported;Single extremity supported Standing balance-Leahy Scale: Poor                             ADL either performed or assessed with clinical judgement   ADL Overall ADL's : Needs  assistance/impaired     Grooming: Min guard;Standing;Wash/dry hands;Wash/dry face;With caregiver independent assisting                   Toilet Transfer: Minimal assistance;Min guard;RW;Comfort height toilet;Grab bars;Ambulation;Cueing for safety;Cueing for sequencing;With caregiver independent assisting Toilet Transfer Details (indicate cue type and reason): wife present and is able to assist pt to and in bathroom Toileting- Clothing Manipulation and Hygiene: Minimal assistance;Sit to/from stand         General ADL Comments: used grab bars throughout toileting activity. Pt and wif educated on ADL A/E for LB selfcare and safety     Vision Patient Visual Report: No change from baseline     Perception     Praxis      Cognition Arousal/Alertness: Awake/alert Behavior During Therapy: WFL for tasks assessed/performed Overall Cognitive Status: Within Functional Limits for tasks assessed                                          Exercises     Shoulder Instructions       General Comments  pt pleasant, cooperative and jovial    Pertinent Vitals/ Pain       Pain Assessment: No/denies pain  Home Living  Prior Functioning/Environment              Frequency  Min 2X/week        Progress Toward Goals  OT Goals(current goals can now be found in the care plan section)  Progress towards OT goals: Progressing toward goals  Acute Rehab OT Goals Patient Stated Goal: go home  Plan Discharge plan needs to be updated    Co-evaluation                 AM-PAC PT "6 Clicks" Daily Activity     Outcome Measure   Help from another person eating meals?: None Help from another person taking care of personal grooming?: A Little Help from another person toileting, which includes using toliet, bedpan, or urinal?: A Little Help from another person bathing (including washing, rinsing,  drying)?: A Little Help from another person to put on and taking off regular upper body clothing?: A Little Help from another person to put on and taking off regular lower body clothing?: A Little 6 Click Score: 19    End of Session Equipment Utilized During Treatment: Gait belt;Rolling walker      Activity Tolerance Patient tolerated treatment well   Patient Left with call bell/phone within reach;with chair alarm set;in bed;with family/visitor present(sitting EOB)   Nurse Communication      Functional Assessment Tool Used: AM-PAC 6 Clicks Daily Activity   Time: 6203-5597 OT Time Calculation (min): 47 min  Charges: OT G-codes **NOT FOR INPATIENT CLASS** Functional Assessment Tool Used: AM-PAC 6 Clicks Daily Activity OT General Charges $OT Visit: 1 Visit OT Treatments $Self Care/Home Management : 8-22 mins $Therapeutic Activity: 23-37 mins     Britt Bottom 12/26/2016, 1:41 PM

## 2016-12-26 NOTE — Discharge Summary (Addendum)
Physician Discharge Summary  Steve LIZAK JJH:[id number] DOB: [birth year] DOA: 12/21/2016  PCP: [name], MD  Admit date: 12/21/2016 Discharge date: 12/26/2016  Time spent: 60 minutes  Recommendations for Outpatient Follow-up:  1. Patient decided to be discharged home instead of SNF. Patient will be discharged home with home health services. Follow-up with [name], MD in 1-2 weeks.  On follow-up patient will need a basic metabolic profile done to follow-up on electrolytes and renal function.  Patient also need a CBC done. 2. Follow up with Dr. Curt Bears, oncology as scheduled.   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 male with medical history significant of lung cancer, htn comes in with fever , coughing for several days.  Fever was noted today by wife.  He has been having problems with low wbc with his chemo.  No n/v/d.  No urinary symptoms.  Pt found to have pna and neutropenic.  Referred for admission for both.  Initial bp in triage was very low, this has been deemed to be errors.  bp is normal.     Hospital Course:  #1 neutropenic fever Likely secondary to pneumonia noted on chest x-ray.  Patient was pancultured with results pending with no growth to date.  Patient remained afebrile during the  hospitalization.  Patient was placed empirically on IV vancomycin and IV cefepime.  Initial blood cultures concerning for coagulase-negative staph which was likely a contaminant.  Repeat blood cultures pending with no growth to date. Urinalysis nitrite negative leukocytes -0-5 WBCs.  Urine strep pneumococcus antigen is negative.  Fever curve trended down. Neutropenia resolved.  Should improve clinically and subsequently transitioned to oral Augmentin.  Patient was discharged home on 3 more days of oral Augmentin to complete a course of antibiotic treatment.  Outpatient follow-up.    2.  Pneumonia Patient presented with fevers, cough, recent chemotherapy chest x-ray consistent with a left lingular and left basilar consolidation/infiltrate.  Patient also noted to have a left pleural effusion.  Fever curve trended down.  Neutropenia improved.  Urine strep pneumococcus antigen negative.  Patient was pancultured results pending with no growth to date by day of discharge.  Patient was placed on Mucinex, scheduled duo nebs, empiric IV vancomycin and IV cefepime.  Patient improved clinically and subsequently transitioned to oral Augmentin which he tolerated.  Patient be discharged home on 3 more days of oral Augmentin to complete a course of antibiotic treatment.   3.  Left exudative pleural effusion The patient had presented with shortness of breath, cough, fevers noted to have a left lingula and left lower lobe pneumonia per chest x-ray.  Left pleural effusion also noted.  Patient underwent ultrasound-guided therapeutic and diagnostic thoracentesis.  1.5 L of fluid was removed.  By light's criteria exudative pleural  effusion.  Gram stain and cultures pending with no growth to date.  Cytology pending will need outpatient follow-up.  Patient was maintained empirically on IV vancomycin and IV cefepime and subsequently transitioned to oral Augmentin.  Patient improved clinically with sats of 98% on room air by day of  discharge.  Patient will be discharged on 3 more days of oral antibiotics to complete a one-week course of antibiotic treatment..   4.  Pancytopenia Likely secondary to recent chemotherapy.  Counts improved. Patient was started on Granix, received 1 dose during the hospitalization.  Neutropenia improved and had resolved by day of discharge.    5.  Diabetes mellitus II Hemoglobin A1c was 6.8 on 10/14/2016.    Patient was maintained on his home regimen of Lantus as well as a sliding scale insulin. Outpatient follow-up.  6.  Gastroesophageal reflux disease She was maintained on a PPI.   7.  COPD Stable.  #8 squamous cell carcinoma of the left lung stage IV Patient stated his oncologist came by to visit on 12/22/2016, Dr. Lorna Few.  Outpatient follow-up.  Per oncology.  #9 ??  Bacteremia/coagulase-negative staph Repeat blood cultures pending with no growth to date.  Likely contamination.  Patient was placed empirically on IV antibiotics of  vancomycin and cefepime initially due to pneumonia and subsequently transitioned to oral Augmentin.  It was felt coagulase-negative staph cultures that were noted on first blood cultures were most likely a contamination.  No further workup needed.       Procedures:  Chest x-ray 12/21/2016  Ultrasound-guided diagnostic and therapeutic thoracentesis 12/22/2016      Consultations:  Oncology notified via epic: Dr. Lorna Few      Discharge Exam: Vitals:   12/26/16 0511 12/26/16 0838  BP: 124/67   Pulse: 84 73  Resp: 16 16  Temp: 97.8 F (36.6 C)   SpO2: 98% 92%    General: NAD Cardiovascular: RRR Respiratory: CTAB  Discharge Instructions   Discharge Instructions    Diet - low sodium heart healthy   Complete by:  As directed    Increase activity slowly   Complete by:  As directed      Current Discharge Medication List    START taking these medications   Details  amoxicillin-clavulanate (AUGMENTIN)  875-125 MG tablet Take 1 tablet every 12 (twelve) hours for 3 days by mouth. Qty: 6 tablet, Refills: 0    guaiFENesin (MUCINEX) 600 MG 12 hr tablet Take 2 tablets (1,200 mg total) 2 (two) times daily for 3 days by mouth. Qty: 12 tablet, Refills: 0    HYDROcodone-homatropine (HYCODAN) 5-1.5 MG/5ML syrup Take 5 mLs every 6 (six) hours as needed by mouth for cough. Qty: 120 mL, Refills: 0      CONTINUE these medications which have NOT CHANGED   Details  acetaminophen (TYLENOL) 325 MG tablet Take 650 mg by mouth every 6 (six) hours as needed (for pain/headaches.).     albuterol (PROVENTIL HFA;VENTOLIN HFA) 108 (90 Base) MCG/ACT inhaler Inhale 2 puffs into the lungs every 6 (six) hours as needed (for wheezing/shortness of breath).     amLODipine (NORVASC) 5 MG tablet Take 5 mg by mouth daily.     carvedilol (COREG) 25 MG tablet Take 25 mg by mouth 2 (two) times daily with a meal.     Cholecalciferol (VITAMIN D-1000 MAX ST) 1000 units tablet Take 1,000 Units by mouth daily.     clotrimazole-betamethasone (LOTRISONE) cream Apply 1 application topically 2 (two) times daily.  docusate sodium (COLACE) 100 MG capsule Take 100 mg by mouth 2 (two) times daily as needed (for constipation.).    gabapentin (NEURONTIN) 400 MG capsule Take 400 mg by mouth at bedtime.    guaiFENesin-dextromethorphan (ROBITUSSIN DM) 100-10 MG/5ML syrup Take 15 mLs every 4 (four) hours as needed by mouth for cough.    HUMALOG 100 UNIT/ML injection INJECT 2 TO 0.12MLS (2-8 UNITS) UNDER THE SKIN 3 TIMES A DAY BEFORE MEALS, AS PER SLIDING SCALE Refills: 0    LANTUS 100 UNIT/ML injection INJECT 0.15ML (15 UNITS TOTAL) UNDER THE SKIN DAILY AT BEDTIME. Refills: 0    lidocaine-prilocaine (EMLA) cream Apply 1 application topically as needed. Qty: 30 g, Refills: 0    magnesium hydroxide (MILK OF MAGNESIA) 400 MG/5ML suspension Take 30 mLs by mouth daily as needed (for constipation.).    mirtazapine (REMERON) 15 MG  tablet Take 15 mg by mouth at bedtime. Refills: 0    Multiple Vitamin (MULTIVITAMIN WITH MINERALS) TABS tablet Take 1 tablet by mouth daily.    neomycin-bacitracin-polymyxin (NEOSPORIN) [phone] ointment Apply 1 application topically at bedtime.    omeprazole (PRILOSEC) 20 MG capsule Take 20 mg by mouth at bedtime.     polyethylene glycol (MIRALAX / GLYCOLAX) packet Take 17 g by mouth daily as needed (for constipation.).     prochlorperazine (COMPAZINE) 10 MG tablet Take 1 tablet (10 mg total) by mouth every 6 (six) hours as needed for nausea or vomiting. Qty: 30 tablet, Refills: 0    rosuvastatin (CRESTOR) 20 MG tablet Take 20 mg by mouth daily.    sucralfate (CARAFATE) 1 GM/10ML suspension Take 10 mLs (1 g total) 4 (four) times daily -  with meals and at bedtime by mouth. Qty: 420 mL, Refills: 2    B-D INS SYR ULTRAFINE 1CC/31G 31G X 5/16" 1 ML MISC     triamcinolone (KENALOG) 0.025 % ointment Apply 1 application 2 (two) times daily topically. Qty: 30 g, Refills: 1       Allergies  Allergen Reactions  . Ciprofloxacin Nausea And Vomiting   Follow-up Information    [name], MD. Schedule an appointment as soon as possible for a visit in 2 week(s).   Specialty:  Internal Medicine Contact information: [place] [address] [phone]        Curt Bears, MD Follow up.   Specialty:  Oncology Why:  f/u as scheduled. Contact information: [address] [place] [address] [place], [place] Follow up.   Specialty:  Home Health Services Contact information: [place] [address] [phone]            The results of significant diagnostics from this hospitalization (including imaging, microbiology, ancillary and laboratory) are listed below for reference.    Significant Diagnostic Studies: Dg Chest 1 View  Result Date: 12/22/2016 CLINICAL DATA:   Status post LEFT thoracentesis EXAM: CHEST 1 VIEW COMPARISON:  None. FINDINGS: Reduction in LEFT pleural effusion lung thoracentesis. Pneumothorax on LEFT. RIGHT prior port noted. IMPRESSION: Reduction pleural fluid following LEFT thoracentesis. No pneumothorax appreciated. Electronically Signed   By: Suzy Bouchard M.D.   On: 12/22/2016 17:16   Dg Chest Portable 1 View  Result Date: 12/21/2016 CLINICAL DATA:  Hypotension fever and sepsis EXAM: PORTABLE CHEST 1 VIEW COMPARISON:  10/14/2016, PET-CT 09/20/2016 FINDINGS: Right-sided central venous port tip overlies the SVC. Post sternotomy changes. Moderate left pleural effusion. Dense  consolidation at the lingula and left base. Enlarged cardiomediastinal silhouette. Aortic atherosclerosis. No pneumothorax. IMPRESSION: Moderate left pleural effusion with consolidation at the lingula and left lung base. Diffuse hazy opacity on the left may relate to layering effusion. Cardiomegaly Electronically Signed   By: Donavan Foil M.D.   On: 12/21/2016 23:21   US Thoracentesis Asp Pleural Space W/img Guide  Result Date: 12/22/2016 INDICATION: Patient with history of pneumonia, squamous cell carcinoma lung, left pleural effusion, dyspnea. Request made for diagnostic and therapeutic left thoracentesis. EXAM: ULTRASOUND GUIDED DIAGNOSTIC AND THERAPEUTIC LEFT THORACENTESIS MEDICATIONS: None. COMPLICATIONS: None immediate. PROCEDURE: An ultrasound guided thoracentesis was thoroughly discussed with the patient and questions answered. The benefits, risks, alternatives and complications were also discussed. The patient understands and wishes to proceed with the procedure. Written consent was obtained. Ultrasound was performed to localize and mark an adequate pocket of fluid in the left chest. The area was then prepped and draped in the normal sterile fashion. 1% Lidocaine was used for local anesthesia. Under ultrasound guidance a Safe-T-Centesis catheter was introduced.  Thoracentesis was performed. The catheter was removed and a dressing applied. FINDINGS: A total of approximately 1.5 liters of yellow fluid was removed. Samples were sent to the laboratory as requested by the clinical team. IMPRESSION: Successful ultrasound guided diagnostic and therapeutic left thoracentesis yielding 1.5 liters of pleural fluid. Read by: Rowe Robert, Steve Electronically Signed   By: Jerilynn Mages.  Shick M.D.   On: 12/22/2016 16:50    Microbiology: Recent Results (from the past 240 hour(s))  Culture, blood (Routine x 2)     Status: Abnormal   Collection Time: 12/21/16 10:33 PM  Result Value Ref Range Status   Specimen Description BLOOD RIGHT ANTECUBITAL  Final   Special Requests   Final    BOTTLES DRAWN AEROBIC AND ANAEROBIC Blood Culture adequate volume   Culture  Setup Time   Final    GRAM POSITIVE COCCI IN CLUSTERS AEROBIC BOTTLE ONLY CRITICAL RESULT CALLED TO, READ BACK BY AND VERIFIED WITH: PHARMD B GREEN [id number] MLM    Culture (A)  Final    STAPHYLOCOCCUS SPECIES (COAGULASE NEGATIVE) THE SIGNIFICANCE OF ISOLATING THIS ORGANISM FROM A SINGLE SET OF BLOOD CULTURES WHEN MULTIPLE SETS ARE DRAWN IS UNCERTAIN. PLEASE NOTIFY THE MICROBIOLOGY DEPARTMENT WITHIN ONE WEEK IF SPECIATION AND SENSITIVITIES ARE REQUIRED. Performed at [facility], [place] [address] [place], [place] [address]    Report Status 12/24/2016 FINAL  Final  Blood Culture ID Panel (Reflexed)     Status: Abnormal   Collection Time: 12/21/16 10:33 PM  Result Value Ref Range Status   Enterococcus species NOT DETECTED NOT DETECTED Final   Listeria monocytogenes NOT DETECTED NOT DETECTED Final   Staphylococcus species DETECTED (A) NOT DETECTED Final    Comment: Methicillin (oxacillin) resistant coagulase negative staphylococcus. Possible blood culture contaminant (unless isolated from more than one blood culture draw or clinical case suggests pathogenicity). No antibiotic treatment is indicated for blood   culture contaminants. CRITICAL RESULT CALLED TO, READ BACK BY AND VERIFIED WITH: PHARMD B GREEN [id number] MLM    Staphylococcus aureus NOT DETECTED NOT DETECTED Final   Methicillin resistance DETECTED (A) NOT DETECTED Final    Comment: CRITICAL RESULT CALLED TO, READ BACK BY AND VERIFIED WITH: PHARMD B GREEN [id number] MLM    Streptococcus species NOT DETECTED NOT DETECTED Final   Streptococcus agalactiae NOT DETECTED NOT DETECTED Final   Streptococcus pneumoniae NOT DETECTED NOT DETECTED Final   Streptococcus pyogenes  NOT DETECTED NOT DETECTED Final   Acinetobacter baumannii NOT DETECTED NOT DETECTED Final   Enterobacteriaceae species NOT DETECTED NOT DETECTED Final   Enterobacter cloacae complex NOT DETECTED NOT DETECTED Final   Escherichia coli NOT DETECTED NOT DETECTED Final   Klebsiella oxytoca NOT DETECTED NOT DETECTED Final   Klebsiella pneumoniae NOT DETECTED NOT DETECTED Final   Proteus species NOT DETECTED NOT DETECTED Final   Serratia marcescens NOT DETECTED NOT DETECTED Final   Haemophilus influenzae NOT DETECTED NOT DETECTED Final   Neisseria meningitidis NOT DETECTED NOT DETECTED Final   Pseudomonas aeruginosa NOT DETECTED NOT DETECTED Final   Candida albicans NOT DETECTED NOT DETECTED Final   Candida glabrata NOT DETECTED NOT DETECTED Final   Candida krusei NOT DETECTED NOT DETECTED Final   Candida parapsilosis NOT DETECTED NOT DETECTED Final   Candida tropicalis NOT DETECTED NOT DETECTED Final    Comment: Performed at [facility], [place] [address] [place], [place] [address]  Culture, blood (Routine x 2)     Status: None (Preliminary result)   Collection Time: 12/21/16 11:05 PM  Result Value Ref Range Status   Specimen Description BLOOD LEFT ANTECUBITAL  Final   Special Requests   Final    BOTTLES DRAWN AEROBIC AND ANAEROBIC Blood Culture adequate volume   Culture   Final    NO GROWTH 4 DAYS Performed at [facility], [place] [address],  [place], [place] [address]    Report Status PENDING  Incomplete  Urine culture     Status: None   Collection Time: 12/22/16  6:03 AM  Result Value Ref Range Status   Specimen Description URINE, CLEAN CATCH  Final   Special Requests NONE  Final   Culture   Final    NO GROWTH Performed at [facility], [address], [place], [place] [address]    Report Status 12/23/2016 FINAL  Final  Culture, sputum-assessment     Status: None   Collection Time: 12/22/16  9:50 AM  Result Value Ref Range Status   Specimen Description SPUTUM  Final   Special Requests NONE  Final   Sputum evaluation THIS SPECIMEN IS ACCEPTABLE FOR SPUTUM CULTURE  Final   Report Status 12/22/2016 FINAL  Final  Culture, respiratory (NON-Expectorated)     Status: None   Collection Time: 12/22/16  9:50 AM  Result Value Ref Range Status   Specimen Description SPUTUM  Final   Special Requests NONE Reflexed from [id number]  Final   Gram Stain   Final    MODERATE WBC PRESENT, PREDOMINANTLY MONONUCLEAR NO ORGANISMS SEEN    Culture   Final    Consistent with normal respiratory flora. Performed at [facility], [place] [address], [place], [place] [address]    Report Status 12/24/2016 FINAL  Final  Culture, body fluid-bottle     Status: None (Preliminary result)   Collection Time: 12/22/16  5:04 PM  Result Value Ref Range Status   Specimen Description FLUID LEFT PLEURAL  Final   Special Requests BOTTLES DRAWN AEROBIC AND ANAEROBIC 10CC  Final   Culture   Final    NO GROWTH 4 DAYS Performed at [facility], [place] [address], [place], [place] [address]    Report Status PENDING  Incomplete  Gram stain     Status: None   Collection Time: 12/22/16  5:04 PM  Result Value Ref Range Status   Specimen Description FLUID LEFT PLEURAL  Final   Special Requests  NONE  Final   Gram Stain   Final    RARE WBC PRESENT, PREDOMINANTLY MONONUCLEAR NO ORGANISMS SEEN Performed at [facility], [address] [place],  [place] [address]    Report Status 12/23/2016 FINAL  Final  Culture, blood (Routine X 2) w Reflex to ID Panel     Status: None (Preliminary result)   Collection Time: 12/23/16  3:03 PM  Result Value Ref Range Status   Specimen Description BLOOD LEFT HAND  Final   Special Requests   Final    BOTTLES DRAWN AEROBIC AND ANAEROBIC Blood Culture adequate volume   Culture   Final    NO GROWTH 3 DAYS Performed at [facility], [place] [address] [place], [place] [address]    Report Status PENDING  Incomplete  Culture, blood (Routine X 2) w Reflex to ID Panel     Status: None (Preliminary result)   Collection Time: 12/23/16  3:07 PM  Result Value Ref Range Status   Specimen Description BLOOD RIGHT ANTECUBITAL  Final   Special Requests   Final    BOTTLES DRAWN AEROBIC AND ANAEROBIC Blood Culture adequate volume   Culture   Final    NO GROWTH 3 DAYS Performed at [facility], [place] [address], [place], [place] [address]    Report Status PENDING  Incomplete     Labs: Basic Metabolic Panel: Recent Labs  Lab 12/22/16 0357 12/23/16 0405 12/24/16 0405 12/25/16 0500 12/26/16 0439  NA 135 137 137 136 139  K 3.6 3.2* 4.0 3.4* 3.9  CL 108 113* 114* 111 112*  CO2 21* 20* 17* 20* 20*  GLUCOSE 139* 79 74 118* 139*  BUN 22* 21* 18 13 10   CREATININE 0.86 0.93 0.93 0.89 0.82  CALCIUM 7.6* 7.5* 7.6* 7.6* 8.4*  MG  --  1.4* 2.0  --   --    Liver Function Tests: Recent Labs  Lab 12/20/16 0931 12/21/16 2233 12/23/16 0832  AST 19 24 23   ALT 19 19 17   ALKPHOS 60 62 52  BILITOT 0.40 0.7 0.8  PROT 5.9* 6.2* 5.3*  ALBUMIN 2.6* 2.9* 2.3*   No results for input(s): LIPASE, AMYLASE in the last 168 hours. No results for input(s): AMMONIA in the last 168 hours. CBC: Recent Labs  Lab 12/22/16 0357 12/23/16 0405 12/24/16 0405 12/25/16 0500 12/26/16 0439  WBC 1.1* 3.9* 10.8* 12.8* 9.0  NEUTROABS 0.3* 2.1 9.1* 11.2* 7.1  HGB 10.1* 10.3* 10.9* 10.4* 11.3*  HCT 30.1* 30.0* 32.5* 30.7* 33.6*   MCV 90.9 91.2 91.5 90.8 90.3  PLT 76* 70* 92* 106* 105*   Cardiac Enzymes: No results for input(s): CKTOTAL, CKMB, CKMBINDEX, TROPONINI in the last 168 hours. BNP: BNP (last 3 results) No results for input(s): BNP in the last 8760 hours.  ProBNP (last 3 results) No results for input(s): PROBNP in the last 8760 hours.  CBG: Recent Labs  Lab 12/25/16 1647 12/25/16 2025 12/25/16 2104 12/26/16 0717 12/26/16 1219  GLUCAP 83 89 105* 119* 139*       Signed:  Irine Seal MD.  Triad Frank 12/26/2016, 1:46 PM

## 2016-12-26 NOTE — Progress Notes (Signed)
Discharge instructions explained to pt and his wife. Prescriptions given to wife. PT/OT in to work with pt and explain technique to wife. They deny any questions. Discharged via wheelchair.

## 2016-12-27 ENCOUNTER — Ambulatory Visit
Admission: RE | Admit: 2016-12-27 | Discharge: 2016-12-27 | Disposition: A | Discharge status: Home / Self Care | Payer: Medicare Other | Source: Ambulatory Visit | Attending: Radiation Oncology | Admitting: Radiation Oncology

## 2016-12-27 DIAGNOSIS — C7931 Secondary malignant neoplasm of brain: Secondary | ICD-10-CM | POA: Diagnosis present

## 2016-12-27 DIAGNOSIS — Z510 Encounter for antineoplastic radiation therapy: Secondary | ICD-10-CM | POA: Diagnosis not present

## 2016-12-27 LAB — CULTURE, BODY FLUID W GRAM STAIN -BOTTLE: Culture: NO GROWTH

## 2016-12-27 LAB — CULTURE, BLOOD (ROUTINE X 2)
Culture: NO GROWTH
Special Requests: ADEQUATE

## 2016-12-28 ENCOUNTER — Ambulatory Visit
Admission: RE | Admit: 2016-12-28 | Discharge: 2016-12-28 | Disposition: A | Discharge status: Home / Self Care | Payer: Medicare Other | Source: Ambulatory Visit | Attending: Radiation Oncology | Admitting: Radiation Oncology

## 2016-12-28 DIAGNOSIS — Z510 Encounter for antineoplastic radiation therapy: Secondary | ICD-10-CM | POA: Diagnosis not present

## 2016-12-28 LAB — CULTURE, BLOOD (ROUTINE X 2)
CULTURE: NO GROWTH
Culture: NO GROWTH
SPECIAL REQUESTS: ADEQUATE
SPECIAL REQUESTS: ADEQUATE

## 2016-12-30 ENCOUNTER — Encounter: Payer: Self-pay | Admitting: Pharmacist

## 2017-01-02 ENCOUNTER — Ambulatory Visit (HOSPITAL_BASED_OUTPATIENT_CLINIC_OR_DEPARTMENT_OTHER): Payer: Medicare Other | Admitting: Oncology

## 2017-01-02 ENCOUNTER — Encounter: Payer: Self-pay | Admitting: Oncology

## 2017-01-02 ENCOUNTER — Ambulatory Visit (HOSPITAL_BASED_OUTPATIENT_CLINIC_OR_DEPARTMENT_OTHER): Payer: Medicare Other

## 2017-01-02 ENCOUNTER — Ambulatory Visit: Payer: Medicare Other

## 2017-01-02 ENCOUNTER — Other Ambulatory Visit (HOSPITAL_BASED_OUTPATIENT_CLINIC_OR_DEPARTMENT_OTHER): Payer: Medicare Other

## 2017-01-02 ENCOUNTER — Ambulatory Visit
Admission: RE | Admit: 2017-01-02 | Discharge: 2017-01-02 | Disposition: A | Discharge status: Home / Self Care | Payer: Medicare Other | Source: Ambulatory Visit | Attending: Radiation Oncology | Admitting: Radiation Oncology

## 2017-01-02 ENCOUNTER — Telehealth: Payer: Self-pay | Admitting: Internal Medicine

## 2017-01-02 ENCOUNTER — Encounter: Payer: Self-pay | Admitting: *Deleted

## 2017-01-02 ENCOUNTER — Other Ambulatory Visit: Payer: Self-pay | Admitting: *Deleted

## 2017-01-02 VITALS — BP 104/54 | HR 50 | Temp 98.0°F | Resp 18 | Ht 71.0 in

## 2017-01-02 DIAGNOSIS — C7931 Secondary malignant neoplasm of brain: Secondary | ICD-10-CM

## 2017-01-02 DIAGNOSIS — Z95828 Presence of other vascular implants and grafts: Secondary | ICD-10-CM

## 2017-01-02 DIAGNOSIS — Z510 Encounter for antineoplastic radiation therapy: Secondary | ICD-10-CM | POA: Diagnosis not present

## 2017-01-02 DIAGNOSIS — Z5111 Encounter for antineoplastic chemotherapy: Secondary | ICD-10-CM

## 2017-01-02 DIAGNOSIS — R5382 Chronic fatigue, unspecified: Secondary | ICD-10-CM | POA: Diagnosis not present

## 2017-01-02 DIAGNOSIS — C3492 Malignant neoplasm of unspecified part of left bronchus or lung: Secondary | ICD-10-CM

## 2017-01-02 DIAGNOSIS — K5900 Constipation, unspecified: Secondary | ICD-10-CM

## 2017-01-02 LAB — CBC WITH DIFFERENTIAL/PLATELET
BASO%: 1.2 % (ref 0.0–2.0)
Basophils Absolute: 0.1 10*3/uL (ref 0.0–0.1)
EOS ABS: 0.8 10*3/uL — AB (ref 0.0–0.5)
EOS%: 11.1 % — ABNORMAL HIGH (ref 0.0–7.0)
HEMATOCRIT: 33.4 % — AB (ref 38.4–49.9)
HGB: 11.1 g/dL — ABNORMAL LOW (ref 13.0–17.1)
LYMPH#: 0.6 10*3/uL — AB (ref 0.9–3.3)
LYMPH%: 8.9 % — AB (ref 14.0–49.0)
MCH: 30.6 pg (ref 27.2–33.4)
MCHC: 33.2 g/dL (ref 32.0–36.0)
MCV: 92 fL (ref 79.3–98.0)
MONO#: 0.5 10*3/uL (ref 0.1–0.9)
MONO%: 7.6 % (ref 0.0–14.0)
NEUT#: 4.9 10*3/uL (ref 1.5–6.5)
NEUT%: 71.2 % (ref 39.0–75.0)
PLATELETS: 183 10*3/uL (ref 140–400)
RBC: 3.63 10*6/uL — AB (ref 4.20–5.82)
RDW: 15.8 % — AB (ref 11.0–14.6)
WBC: 6.9 10*3/uL (ref 4.0–10.3)

## 2017-01-02 LAB — COMPREHENSIVE METABOLIC PANEL
ALBUMIN: 2.6 g/dL — AB (ref 3.5–5.0)
ALK PHOS: 65 U/L (ref 40–150)
ALT: 18 U/L (ref 0–55)
ANION GAP: 7 meq/L (ref 3–11)
AST: 25 U/L (ref 5–34)
BILIRUBIN TOTAL: 0.41 mg/dL (ref 0.20–1.20)
BUN: 13.8 mg/dL (ref 7.0–26.0)
CALCIUM: 8.7 mg/dL (ref 8.4–10.4)
CO2: 25 mEq/L (ref 22–29)
CREATININE: 0.8 mg/dL (ref 0.7–1.3)
Chloride: 106 mEq/L (ref 98–109)
Glucose: 108 mg/dl (ref 70–140)
Potassium: 4.2 mEq/L (ref 3.5–5.1)
Sodium: 138 mEq/L (ref 136–145)
TOTAL PROTEIN: 5.7 g/dL — AB (ref 6.4–8.3)

## 2017-01-02 LAB — TSH: TSH: 0.946 m(IU)/L (ref 0.320–4.118)

## 2017-01-02 MED ORDER — SODIUM CHLORIDE 0.9 % IV SOLN
449.0000 mg | Freq: Once | INTRAVENOUS | Status: AC
  Administered 2017-01-02: 450 mg via INTRAVENOUS
  Filled 2017-01-02: qty 45

## 2017-01-02 MED ORDER — SODIUM CHLORIDE 0.9% FLUSH
3.0000 mL | INTRAVENOUS | Status: DC | PRN
  Filled 2017-01-02: qty 10

## 2017-01-02 MED ORDER — DIPHENHYDRAMINE HCL 50 MG/ML IJ SOLN
INTRAMUSCULAR | Status: AC
  Filled 2017-01-02: qty 1

## 2017-01-02 MED ORDER — PALONOSETRON HCL INJECTION 0.25 MG/5ML
0.2500 mg | Freq: Once | INTRAVENOUS | Status: AC
  Administered 2017-01-02: 0.25 mg via INTRAVENOUS

## 2017-01-02 MED ORDER — SODIUM CHLORIDE 0.9% FLUSH
10.0000 mL | Freq: Once | INTRAVENOUS | Status: AC
  Administered 2017-01-02: 10 mL
  Filled 2017-01-02: qty 10

## 2017-01-02 MED ORDER — SODIUM CHLORIDE 0.9% FLUSH
10.0000 mL | INTRAVENOUS | Status: DC | PRN
  Administered 2017-01-02: 10 mL
  Filled 2017-01-02: qty 10

## 2017-01-02 MED ORDER — DIPHENHYDRAMINE HCL 50 MG/ML IJ SOLN
50.0000 mg | Freq: Once | INTRAMUSCULAR | Status: AC
  Administered 2017-01-02: 50 mg via INTRAVENOUS

## 2017-01-02 MED ORDER — FAMOTIDINE IN NACL 20-0.9 MG/50ML-% IV SOLN
20.0000 mg | Freq: Once | INTRAVENOUS | Status: AC
  Administered 2017-01-02: 20 mg via INTRAVENOUS

## 2017-01-02 MED ORDER — FAMOTIDINE IN NACL 20-0.9 MG/50ML-% IV SOLN
INTRAVENOUS | Status: AC
  Filled 2017-01-02: qty 50

## 2017-01-02 MED ORDER — SODIUM CHLORIDE 0.9 % IV SOLN
Freq: Once | INTRAVENOUS | Status: AC
  Administered 2017-01-02: 12:00:00 via INTRAVENOUS

## 2017-01-02 MED ORDER — SODIUM CHLORIDE 0.9 % IV SOLN
20.0000 mg | Freq: Once | INTRAVENOUS | Status: AC
  Administered 2017-01-02: 20 mg via INTRAVENOUS
  Filled 2017-01-02: qty 2

## 2017-01-02 MED ORDER — PALONOSETRON HCL INJECTION 0.25 MG/5ML
INTRAVENOUS | Status: AC
  Filled 2017-01-02: qty 5

## 2017-01-02 MED ORDER — SODIUM CHLORIDE 0.9 % IV SOLN
175.0000 mg/m2 | Freq: Once | INTRAVENOUS | Status: AC
  Administered 2017-01-02: 354 mg via INTRAVENOUS
  Filled 2017-01-02: qty 59

## 2017-01-02 MED ORDER — HEPARIN SOD (PORK) LOCK FLUSH 100 UNIT/ML IV SOLN
500.0000 [IU] | Freq: Once | INTRAVENOUS | Status: AC | PRN
  Administered 2017-01-02: 500 [IU]
  Filled 2017-01-02: qty 5

## 2017-01-02 NOTE — Patient Instructions (Signed)
   [facility] Discharge Instructions for Patients Receiving Chemotherapy  Today you received the following chemotherapy agents Taxol and Carboplatin   To help prevent nausea and vomiting after your treatment, we encourage you to take your nausea medication as directed.    If you develop nausea and vomiting that is not controlled by your nausea medication, call the clinic.   BELOW ARE SYMPTOMS THAT SHOULD BE REPORTED IMMEDIATELY:  *FEVER GREATER THAN 100.5 F  *CHILLS WITH OR WITHOUT FEVER  NAUSEA AND VOMITING THAT IS NOT CONTROLLED WITH YOUR NAUSEA MEDICATION  *UNUSUAL SHORTNESS OF BREATH  *UNUSUAL BRUISING OR BLEEDING  TENDERNESS IN MOUTH AND THROAT WITH OR WITHOUT PRESENCE OF ULCERS  *URINARY PROBLEMS  *BOWEL PROBLEMS  UNUSUAL RASH Items with * indicate a potential emergency and should be followed up as soon as possible.  Feel free to call the clinic should you have any questions or concerns. The clinic phone number is [phone].  Please show the CHEMO ALERT CARD at check-in to the Emergency Department and triage nurse.   

## 2017-01-02 NOTE — Assessment & Plan Note (Addendum)
This is a very pleasant 83 year old white male with metastatic non-small cell lung cancer, squamous cell carcinoma with negative PDL 1 expression and enlarging solitary brain metastasis.The patient received stereotactic radiotherapy to his brain lesion. He is currently receiving radiation to his lung mass.  The patient is status post 1 cycle of palliative systemic chemotherapy with carboplatin for an AUC of 5 and paclitaxel 175 mg/meter squared.  [name] was held due to ongoing radiation therapy.   He was hospitalized after his first cycle of chemotherapy due to febrile neutropenia.  His counts have now recovered and he is feeling better and would like to proceed with his chemotherapy today.  The patient will proceed with cycle 2 of his carboplatin and Taxol as scheduled today.  Will hold the Keytruda due to ongoing radiation.  Plan to begin the [facility] once radiation is complete.  The patient will receive Neulasta with his chemotherapy.  He will continue weekly labs.  The patient will return in 3 weeks for evaluation prior to cycle 3 of his treatment.  For constipation, he was instructed to use Senokot-S 1-2 tablets daily.  The patient was advised to call immediately if he has any concerning symptoms in the interval. The patient voices understanding of current disease status and treatment options and is in agreement with the current care plan. All questions were answered. The patient knows to call the clinic with any problems, questions or concerns. We can certainly see the patient much sooner if necessary.

## 2017-01-02 NOTE — Telephone Encounter (Signed)
Patient already on schedule as requested per 11/26 los. Added additional injection for 12/19. Patient to get updated schedule at next visit.

## 2017-01-02 NOTE — Telephone Encounter (Signed)
Left message for patient regarding upcoming appointment updates per 11/23 sch message.

## 2017-01-02 NOTE — Progress Notes (Signed)
[place] OFFICE PROGRESS NOTE  Steve Leach, MD [address] [place] [address]  DIAGNOSIS: Stage IV (T1b, N3, M1c) non-small cell lung cancer, squamous cell carcinoma diagnosed in September 2018 presented with left infrahilar mass in addition to mediastinal and supraclavicular lymphadenopathy as well as metastatic brain lesion.  PDL1 expression 0%.  PRIOR THERAPY: SRS to the left cerebellar brain metastasis on 11/10/2016  CURRENT THERAPY: Systemic chemotherapy with carboplatin for AUC of 5, paclitaxel 175 MG/M2 and Keytruda (pembrolizumab) 200 MG IV every 3 weeks. First dose 12/12/2016.  Status post 1 cycle.  [name] was held with the first cycle due to ongoing radiation.  INTERVAL HISTORY: Steve Frank 83 y.o. Frank returns for routine follow-up visit accompanied by his wife.  The patient was recently hospitalized for febrile neutropenia.  He reports that he is feeling better now he is out of the hospital.  He continues on radiation.  Denies fevers and chills.  Denies chest pain, shortness of breath, hemoptysis.  He still has a nonproductive cough.  Denies nausea, vomiting, diarrhea.  He reports that he has had constipation due to not taking his stool softener on a regular basis.  He plans to take a stool softener later today when he gets home.  The patient is here for evaluation prior to cycle 2 of his chemotherapy.  MEDICAL HISTORY: Past Medical History:  Diagnosis Date  . Arthritis   . BPH (benign prostatic hyperplasia)   . CAD (coronary artery disease)    s/p 2 cabg's  . Cancer (HCC)    Basal and squamous cell  skin cancer  . Cerebellar mass   . Chest mass   . Diabetes ([place])    Type II  . GERD (gastroesophageal reflux disease)   . Head injury with loss of consciousness [facility])    age 33 or 59-   . Heart attack ([place])   . HLD (hyperlipidemia)   . Hypertension   . Macular degeneration   . Pneumonia     x 2 last 1071  . Stage IV squamous cell  carcinoma of left lung ([name]) 10/20/2016    ALLERGIES:  is allergic to ciprofloxacin.  MEDICATIONS:  Current Outpatient Medications  Medication Sig Dispense Refill  . albuterol (PROVENTIL HFA;VENTOLIN HFA) 108 (90 Base) MCG/ACT inhaler Inhale 2 puffs into the lungs every 6 (six) hours as needed (for wheezing/shortness of breath).     Marland Kitchen amLODipine (NORVASC) 5 MG tablet Take 5 mg by mouth daily.     . carvedilol (COREG) 25 MG tablet Take 25 mg by mouth 2 (two) times daily with a meal.     . Cholecalciferol (VITAMIN D-1000 MAX ST) 1000 units tablet Take 1,000 Units by mouth daily.     . clotrimazole-betamethasone (LOTRISONE) cream Apply 1 application topically 2 (two) times daily.    Marland Kitchen docusate sodium (COLACE) 100 MG capsule Take 100 mg by mouth 2 (two) times daily as needed (for constipation.).    Marland Kitchen gabapentin (NEURONTIN) 400 MG capsule Take 400 mg by mouth at bedtime.    Marland Kitchen HUMALOG 100 UNIT/ML injection INJECT 2 TO 0.12MLS (2-8 UNITS) UNDER THE SKIN 3 TIMES A DAY BEFORE MEALS, AS PER SLIDING SCALE  0  . HYDROcodone-homatropine (HYCODAN) 5-1.5 MG/5ML syrup Take 5 mLs every 6 (six) hours as needed by mouth for cough. 120 mL 0  . LANTUS 100 UNIT/ML injection INJECT 0.15ML (15 UNITS TOTAL) UNDER THE SKIN DAILY AT BEDTIME.  0  . lidocaine-prilocaine (EMLA) cream  Apply 1 application topically as needed. 30 g 0  . mirtazapine (REMERON) 15 MG tablet Take 15 mg by mouth at bedtime.  0  . neomycin-bacitracin-polymyxin (NEOSPORIN) [phone] ointment Apply 1 application topically at bedtime.    Marland Kitchen omeprazole (PRILOSEC) 20 MG capsule Take 20 mg by mouth at bedtime.     . rosuvastatin (CRESTOR) 20 MG tablet Take 20 mg by mouth daily.    . sucralfate (CARAFATE) 1 GM/10ML suspension Take 10 mLs (1 g total) 4 (four) times daily -  with meals and at bedtime by mouth. 420 mL 2  . triamcinolone (KENALOG) 0.025 % ointment Apply 1 application 2 (two) times daily topically. 30 g 1  . acetaminophen (TYLENOL) 325 MG  tablet Take 650 mg by mouth every 6 (six) hours as needed (for pain/headaches.).     Marland Kitchen B-D INS SYR ULTRAFINE 1CC/31G 31G X 5/16" 1 ML MISC     . guaiFENesin-dextromethorphan (ROBITUSSIN DM) 100-10 MG/5ML syrup Take 15 mLs every 4 (four) hours as needed by mouth for cough.    . magnesium hydroxide (MILK OF MAGNESIA) 400 MG/5ML suspension Take 30 mLs by mouth daily as needed (for constipation.).    Marland Kitchen Multiple Vitamin (MULTIVITAMIN WITH MINERALS) TABS tablet Take 1 tablet by mouth daily.    . polyethylene glycol (MIRALAX / GLYCOLAX) packet Take 17 g by mouth daily as needed (for constipation.).     Marland Kitchen prochlorperazine (COMPAZINE) 10 MG tablet Take 1 tablet (10 mg total) by mouth every 6 (six) hours as needed for nausea or vomiting. 30 tablet 0   No current facility-administered medications for this visit.    Facility-Administered Medications Ordered in Other Visits  Medication Dose Route Frequency Provider Last Rate Last Dose  . CARBOplatin (PARAPLATIN) 450 mg in sodium chloride 0.9 % 250 mL chemo infusion  450 mg Intravenous Once Curt Bears, MD      . heparin lock flush 100 unit/mL  500 Units Intracatheter Once PRN Curt Bears, MD      . PACLitaxel (TAXOL) 354 mg in sodium chloride 0.9 % 500 mL chemo infusion (> 7m/m2)  175 mg/m2 (Treatment Plan Recorded) Intravenous Once MCurt Bears MD 186 mL/hr at 01/02/17 1412 354 mg at 01/02/17 1412  . sodium chloride flush (NS) 0.9 % injection 10 mL  10 mL Intracatheter PRN MCurt Bears MD      . sodium chloride flush (NS) 0.9 % injection 3 mL  3 mL Intravenous PRN MCurt Bears MD        SURGICAL HISTORY:  Past Surgical History:  Procedure Laterality Date  . CARDIAC CATHETERIZATION    . COLONOSCOPY W/ POLYPECTOMY    . CORONARY [facility] GRAFT     [facility]  . CORONARY ARTERY BYPASS GRAFT     1st one was @ [facility] 26years ago 12/1991  . CRANIECTOMY FOR EXCISION OF BRAIN TUMOR INFRATENTORIAL / POSTERIOR  FOSSA  08/05/2016   Dr RRebecka Apley @ [facility]/Neurosurgery  . FLEXIBLE BRONCHOSCOPY  08/07/2016   Dr WGwenevere Ghazi . IR FLUORO GUIDE PORT INSERTION RIGHT  11/18/2016  . IR [place] VASC ACCESS RIGHT  11/18/2016  . LUNG BIOPSY N/A 10/14/2016   Procedure: LUNG BIOPSY;  Surgeon: GGrace Isaac MD;  [facility]  Service: Thoracic;  Laterality: N/A;  . VIDEO BRONCHOSCOPY WITH ENDOBRONCHIAL ULTRASOUND N/A 10/14/2016   Procedure: VIDEO BRONCHOSCOPY WITH ENDOBRONCHIAL ULTRASOUND;  Surgeon: GGrace Isaac MD;  [facility]  Service: Thoracic;  Laterality:  N/A;    REVIEW OF SYSTEMS:   Review of Systems  Constitutional: Negative for appetite change, chills, fatigue, fever and unexpected weight change.  HENT:   Negative for mouth sores, nosebleeds, sore throat and trouble swallowing.   Eyes: Negative for eye problems and icterus.  Respiratory: Negative for cough, hemoptysis, shortness of breath and wheezing.   Cardiovascular: Negative for chest pain and leg swelling.  Gastrointestinal: Negative for abdominal pain, constipation, diarrhea, nausea and vomiting.  Genitourinary: Negative for bladder incontinence, difficulty urinating, dysuria, frequency and hematuria.   Musculoskeletal: Negative for back pain, gait problem, neck pain and neck stiffness.  Skin: Negative for itching and rash.  Neurological: Negative for dizziness, extremity weakness, gait problem, headaches, light-headedness and seizures.  Hematological: Negative for adenopathy. Does not bruise/bleed easily.  Psychiatric/Behavioral: Negative for confusion, depression and sleep disturbance. The patient is not nervous/anxious.     PHYSICAL EXAMINATION:  Blood pressure (!) 104/54, pulse (!) 50, temperature 98 F (36.7 C), temperature source Oral, resp. rate 18, height _0  (1.803 m), SpO2 99 %.  ECOG PERFORMANCE STATUS: 1 - Symptomatic but completely ambulatory  Physical Exam  Constitutional: Oriented to person,  place, and time and well-developed, well-nourished, and in no distress. No distress.  HENT:  Head: Normocephalic and atraumatic.  Mouth/Throat: Oropharynx is clear and moist. No oropharyngeal exudate.  Eyes: Conjunctivae are normal. Right eye exhibits no discharge. Left eye exhibits no discharge. No scleral icterus.  Neck: Normal range of motion. Neck supple.  Cardiovascular: Normal rate, regular rhythm, normal heart sounds and intact distal pulses.   Pulmonary/Chest: Effort normal and breath sounds normal. No respiratory distress. No wheezes. No rales.  Abdominal: Soft. Bowel sounds are normal. Exhibits no distension and no mass. There is no tenderness.  Musculoskeletal: Normal range of motion. Exhibits no edema.  Lymphadenopathy:    No cervical adenopathy.  Neurological: Alert and oriented to person, place, and time. Exhibits normal muscle tone. Gait normal. Coordination normal.  Skin: Skin is warm and dry. No rash noted. Not diaphoretic. No erythema. No pallor.  Psychiatric: Mood, memory and judgment normal.  Vitals reviewed.  LABORATORY DATA: Lab Results  Component Value Date   WBC 6.9 01/02/2017   HGB 11.1 (L) 01/02/2017   HCT 33.4 (L) 01/02/2017   MCV 92.0 01/02/2017   PLT 183 01/02/2017      Chemistry      Component Value Date/Time   NA 138 01/02/2017 1020   K 4.2 01/02/2017 1020   CL 112 (H) 12/26/2016 0439   CO2 25 01/02/2017 1020   BUN 13.8 01/02/2017 1020   CREATININE 0.8 01/02/2017 1020      Component Value Date/Time   CALCIUM 8.7 01/02/2017 1020   ALKPHOS 65 01/02/2017 1020   AST 25 01/02/2017 1020   ALT 18 01/02/2017 1020   BILITOT 0.41 01/02/2017 1020       RADIOGRAPHIC STUDIES:  Dg Chest 1 View  Result Date: 12/22/2016 CLINICAL DATA:  Status post LEFT thoracentesis EXAM: CHEST 1 VIEW COMPARISON:  None. FINDINGS: Reduction in LEFT pleural effusion lung thoracentesis. Pneumothorax on LEFT. RIGHT prior port noted. IMPRESSION: Reduction pleural fluid  following LEFT thoracentesis. No pneumothorax appreciated. Electronically Signed   By: Suzy Bouchard M.D.   On: 12/22/2016 17:16   Dg Chest Portable 1 View  Result Date: 12/21/2016 CLINICAL DATA:  Hypotension fever and sepsis EXAM: PORTABLE CHEST 1 VIEW COMPARISON:  10/14/2016, PET-CT 09/20/2016 FINDINGS: Right-sided central venous port tip overlies the SVC. Post sternotomy changes. Moderate  left pleural effusion. Dense consolidation at the lingula and left base. Enlarged cardiomediastinal silhouette. Aortic atherosclerosis. No pneumothorax. IMPRESSION: Moderate left pleural effusion with consolidation at the lingula and left lung base. Diffuse hazy opacity on the left may relate to layering effusion. Cardiomegaly Electronically Signed   By: Donavan Foil M.D.   On: 12/21/2016 23:21   US Thoracentesis Asp Pleural Space W/img Guide  Result Date: 12/22/2016 INDICATION: Patient with history of pneumonia, squamous cell carcinoma lung, left pleural effusion, dyspnea. Request made for diagnostic and therapeutic left thoracentesis. EXAM: ULTRASOUND GUIDED DIAGNOSTIC AND THERAPEUTIC LEFT THORACENTESIS MEDICATIONS: None. COMPLICATIONS: None immediate. PROCEDURE: An ultrasound guided thoracentesis was thoroughly discussed with the patient and questions answered. The benefits, risks, alternatives and complications were also discussed. The patient understands and wishes to proceed with the procedure. Written consent was obtained. Ultrasound was performed to localize and mark an adequate pocket of fluid in the left chest. The area was then prepped and draped in the normal sterile fashion. 1% Lidocaine was used for local anesthesia. Under ultrasound guidance a Safe-T-Centesis catheter was introduced. Thoracentesis was performed. The catheter was removed and a dressing applied. FINDINGS: A total of approximately 1.5 liters of yellow fluid was removed. Samples were sent to the laboratory as requested by the clinical  team. IMPRESSION: Successful ultrasound guided diagnostic and therapeutic left thoracentesis yielding 1.5 liters of pleural fluid. Read by: Rowe Robert, PA-C Electronically Signed   By: Jerilynn Mages.  Shick M.D.   On: 12/22/2016 16:50     ASSESSMENT/PLAN:  Stage IV squamous cell carcinoma of left lung ([place]) This is a very pleasant Steve year old white Frank with metastatic non-small cell lung cancer, squamous cell carcinoma with negative PDL 1 expression and enlarging solitary brain metastasis.The patient received stereotactic radiotherapy to his brain lesion. He is currently receiving radiation to his lung mass.  The patient is status post 1 cycle of palliative systemic chemotherapy with carboplatin for an AUC of 5 and paclitaxel 175 mg/meter squared.  [name] was held due to ongoing radiation therapy.   He was hospitalized after his first cycle of chemotherapy due to febrile neutropenia.  His counts have now recovered and he is feeling better and would like to proceed with his chemotherapy today.  The patient will proceed with cycle 2 of his carboplatin and Taxol as scheduled today.  Will hold the Keytruda due to ongoing radiation.  Plan to begin the [facility] once radiation is complete.  The patient will receive Neulasta with his chemotherapy.  He will continue weekly labs.  The patient will return in 3 weeks for evaluation prior to cycle 3 of his treatment.  For constipation, he was instructed to use Senokot-S 1-2 tablets daily.  The patient was advised to call immediately if he has any concerning symptoms in the interval. The patient voices understanding of current disease status and treatment options and is in agreement with the current care plan. All questions were answered. The patient knows to call the clinic with any problems, questions or concerns. We can certainly see the patient much sooner if necessary.  No orders of the defined types were placed in this encounter.  Mikey Bussing, DNP,  AGPCNP-BC, AOCNP 01/02/17

## 2017-01-03 ENCOUNTER — Ambulatory Visit
Admission: RE | Admit: 2017-01-03 | Discharge: 2017-01-03 | Disposition: A | Discharge status: Home / Self Care | Payer: Medicare Other | Source: Ambulatory Visit | Attending: Radiation Oncology | Admitting: Radiation Oncology

## 2017-01-03 DIAGNOSIS — Z510 Encounter for antineoplastic radiation therapy: Secondary | ICD-10-CM | POA: Diagnosis not present

## 2017-01-04 ENCOUNTER — Ambulatory Visit
Admission: RE | Admit: 2017-01-04 | Discharge: 2017-01-04 | Disposition: A | Discharge status: Home / Self Care | Payer: Medicare Other | Source: Ambulatory Visit | Attending: Radiation Oncology | Admitting: Radiation Oncology

## 2017-01-04 ENCOUNTER — Ambulatory Visit (HOSPITAL_BASED_OUTPATIENT_CLINIC_OR_DEPARTMENT_OTHER): Payer: Medicare Other

## 2017-01-04 VITALS — BP 110/65 | HR 60 | Temp 98.5°F | Resp 18

## 2017-01-04 DIAGNOSIS — C3492 Malignant neoplasm of unspecified part of left bronchus or lung: Secondary | ICD-10-CM

## 2017-01-04 DIAGNOSIS — Z5189 Encounter for other specified aftercare: Secondary | ICD-10-CM | POA: Diagnosis not present

## 2017-01-04 DIAGNOSIS — C7931 Secondary malignant neoplasm of brain: Secondary | ICD-10-CM

## 2017-01-04 DIAGNOSIS — Z510 Encounter for antineoplastic radiation therapy: Secondary | ICD-10-CM | POA: Diagnosis not present

## 2017-01-04 DIAGNOSIS — Z95828 Presence of other vascular implants and grafts: Secondary | ICD-10-CM

## 2017-01-04 MED ORDER — TBO-FILGRASTIM 480 MCG/0.8ML ~~LOC~~ SOSY: 480.0000 ug | PREFILLED_SYRINGE | Freq: Once | SUBCUTANEOUS | Status: DC

## 2017-01-04 MED ORDER — PEGFILGRASTIM INJECTION 6 MG/0.6ML ~~LOC~~
6.0000 mg | PREFILLED_SYRINGE | Freq: Once | SUBCUTANEOUS | Status: AC
  Administered 2017-01-04: 6 mg via SUBCUTANEOUS
  Filled 2017-01-04: qty 0.6

## 2017-01-05 ENCOUNTER — Ambulatory Visit
Admission: RE | Admit: 2017-01-05 | Discharge: 2017-01-05 | Disposition: A | Discharge status: Home / Self Care | Payer: Medicare Other | Source: Ambulatory Visit | Attending: Radiation Oncology | Admitting: Radiation Oncology

## 2017-01-05 DIAGNOSIS — Z510 Encounter for antineoplastic radiation therapy: Secondary | ICD-10-CM | POA: Diagnosis not present

## 2017-01-06 ENCOUNTER — Ambulatory Visit
Admission: RE | Admit: 2017-01-06 | Discharge: 2017-01-06 | Disposition: A | Discharge status: Home / Self Care | Payer: Medicare Other | Source: Ambulatory Visit | Attending: Radiation Oncology | Admitting: Radiation Oncology

## 2017-01-06 DIAGNOSIS — Z510 Encounter for antineoplastic radiation therapy: Secondary | ICD-10-CM | POA: Diagnosis not present

## 2017-01-09 ENCOUNTER — Ambulatory Visit: Payer: Medicare Other

## 2017-01-09 ENCOUNTER — Other Ambulatory Visit (HOSPITAL_BASED_OUTPATIENT_CLINIC_OR_DEPARTMENT_OTHER): Payer: Medicare Other

## 2017-01-09 ENCOUNTER — Ambulatory Visit
Admission: RE | Admit: 2017-01-09 | Discharge: 2017-01-09 | Disposition: A | Discharge status: Home / Self Care | Payer: Medicare Other | Source: Ambulatory Visit | Attending: Radiation Oncology | Admitting: Radiation Oncology

## 2017-01-09 DIAGNOSIS — C3492 Malignant neoplasm of unspecified part of left bronchus or lung: Secondary | ICD-10-CM

## 2017-01-09 DIAGNOSIS — Z510 Encounter for antineoplastic radiation therapy: Secondary | ICD-10-CM | POA: Diagnosis not present

## 2017-01-09 LAB — COMPREHENSIVE METABOLIC PANEL
ALK PHOS: 63 U/L (ref 40–150)
ALT: 15 U/L (ref 0–55)
ANION GAP: 7 meq/L (ref 3–11)
AST: 18 U/L (ref 5–34)
Albumin: 3 g/dL — ABNORMAL LOW (ref 3.5–5.0)
BILIRUBIN TOTAL: 0.76 mg/dL (ref 0.20–1.20)
BUN: 23 mg/dL (ref 7.0–26.0)
CALCIUM: 9.3 mg/dL (ref 8.4–10.4)
CO2: 26 meq/L (ref 22–29)
CREATININE: 0.9 mg/dL (ref 0.7–1.3)
Chloride: 104 mEq/L (ref 98–109)
Glucose: 177 mg/dl — ABNORMAL HIGH (ref 70–140)
Potassium: 4.7 mEq/L (ref 3.5–5.1)
Sodium: 138 mEq/L (ref 136–145)
TOTAL PROTEIN: 6.2 g/dL — AB (ref 6.4–8.3)

## 2017-01-09 LAB — CBC WITH DIFFERENTIAL/PLATELET
BASO%: 4.1 % — ABNORMAL HIGH (ref 0.0–2.0)
BASOS ABS: 0.1 10*3/uL (ref 0.0–0.1)
EOS ABS: 0.5 10*3/uL (ref 0.0–0.5)
EOS%: 29.6 % — ABNORMAL HIGH (ref 0.0–7.0)
HEMATOCRIT: 32.5 % — AB (ref 38.4–49.9)
HEMOGLOBIN: 10.7 g/dL — AB (ref 13.0–17.1)
LYMPH%: 10.1 % — ABNORMAL LOW (ref 14.0–49.0)
MCH: 30.6 pg (ref 27.2–33.4)
MCHC: 32.9 g/dL (ref 32.0–36.0)
MCV: 92.9 fL (ref 79.3–98.0)
MONO#: 0.4 10*3/uL (ref 0.1–0.9)
MONO%: 20.7 % — AB (ref 0.0–14.0)
NEUT#: 0.6 10*3/uL — ABNORMAL LOW (ref 1.5–6.5)
NEUT%: 35.5 % — ABNORMAL LOW (ref 39.0–75.0)
NRBC: 0 % (ref 0–0)
PLATELETS: 99 10*3/uL — AB (ref 140–400)
RBC: 3.5 10*6/uL — ABNORMAL LOW (ref 4.20–5.82)
RDW: 16.5 % — AB (ref 11.0–14.6)
WBC: 1.7 10*3/uL — ABNORMAL LOW (ref 4.0–10.3)
lymph#: 0.2 10*3/uL — ABNORMAL LOW (ref 0.9–3.3)

## 2017-01-09 LAB — RESEARCH LABS

## 2017-01-10 ENCOUNTER — Ambulatory Visit
Admission: RE | Admit: 2017-01-10 | Discharge: 2017-01-10 | Disposition: A | Discharge status: Home / Self Care | Payer: Medicare Other | Source: Ambulatory Visit | Attending: Radiation Oncology | Admitting: Radiation Oncology

## 2017-01-10 ENCOUNTER — Encounter: Payer: Self-pay | Admitting: Radiation Oncology

## 2017-01-10 ENCOUNTER — Ambulatory Visit: Payer: Medicare Other

## 2017-01-10 DIAGNOSIS — Z510 Encounter for antineoplastic radiation therapy: Secondary | ICD-10-CM | POA: Diagnosis not present

## 2017-01-11 NOTE — Progress Notes (Addendum)
  Radiation Oncology         [phone]) [phone] ________________________________  Name: Steve Frank MRN: [id number]  Date: 01/10/2017  DOB: [birth year]   End of Treatment Note  Diagnosis:   83 y.o. gentleman non-small cell carcinoma of the left lower lung with solitary brain metastasis   Indication for treatment:  Curative, Chemo-Radiotherapy       Radiation treatment dates:    1.  Brain 11/17/16 2.  Chest 11/21/2016 - 01/10/2017  Site/dose:    1.  Left cerebellar target was treated to a prescription dose of 15 Gy.   2.  The primary tumor and involved mediastinal adenopathy were treated to 66 Gy in 33 fractions of 2 Gy.  Beams/energy:    1.  Left cerebellar target was treated using 3 VMAT RapidArcs.  ExacTrac registration was performed for each couch angle.  The 100% isodose line was prescribed.  2.  A five field 3D conformal treatment arrangement was used delivering 6 and 10 MV photons.  Daily image-guidance CT was used to align the treatment with the targeted volume  Narrative: The patient tolerated radiation treatment relatively well.  He experienced some pain and difficulty with swallowing but used Carafate to help with the pain. He also experienced some weight loss related to poor appetite but supplemented his diet with 1 Boost per day. He reported a cough with occasional clear to yellow sputum and fatigue. He used Robitussin DM prn cough with relief.  He denied hemoptysis. He reported shortness of breath on exertion, unchanged.  Plan: The patient has completed radiation treatment. The patient will return to radiation oncology clinic for routine followup in one month after CT scan. I advised him to call or return sooner if he has any questions or concerns related to his recovery or treatment.  ________________________________  Sheral Apley. Tammi Klippel, M.D.  This document serves as a record of services personally performed by Tyler Pita, MD. It was created on his behalf by Rae Lips, a trained medical scribe. The creation of this record is based on the scribe's personal observations and the provider's statements to them. This document has been checked and approved by the attending provider.

## 2017-01-16 ENCOUNTER — Other Ambulatory Visit: Payer: Medicare Other

## 2017-01-23 ENCOUNTER — Ambulatory Visit: Payer: Medicare Other

## 2017-01-23 ENCOUNTER — Ambulatory Visit (HOSPITAL_BASED_OUTPATIENT_CLINIC_OR_DEPARTMENT_OTHER): Payer: Medicare Other

## 2017-01-23 ENCOUNTER — Encounter: Payer: Self-pay | Admitting: General Practice

## 2017-01-23 ENCOUNTER — Ambulatory Visit (HOSPITAL_BASED_OUTPATIENT_CLINIC_OR_DEPARTMENT_OTHER): Payer: Medicare Other | Admitting: Internal Medicine

## 2017-01-23 ENCOUNTER — Encounter: Payer: Self-pay | Admitting: Internal Medicine

## 2017-01-23 DIAGNOSIS — C3492 Malignant neoplasm of unspecified part of left bronchus or lung: Secondary | ICD-10-CM | POA: Diagnosis present

## 2017-01-23 DIAGNOSIS — Z5111 Encounter for antineoplastic chemotherapy: Secondary | ICD-10-CM | POA: Diagnosis present

## 2017-01-23 DIAGNOSIS — Z95828 Presence of other vascular implants and grafts: Secondary | ICD-10-CM

## 2017-01-23 DIAGNOSIS — R5382 Chronic fatigue, unspecified: Secondary | ICD-10-CM

## 2017-01-23 DIAGNOSIS — C3490 Malignant neoplasm of unspecified part of unspecified bronchus or lung: Secondary | ICD-10-CM

## 2017-01-23 DIAGNOSIS — Z79899 Other long term (current) drug therapy: Secondary | ICD-10-CM | POA: Diagnosis not present

## 2017-01-23 DIAGNOSIS — C7931 Secondary malignant neoplasm of brain: Secondary | ICD-10-CM

## 2017-01-23 LAB — COMPREHENSIVE METABOLIC PANEL
ALT: 13 U/L (ref 0–55)
ANION GAP: 8 meq/L (ref 3–11)
AST: 21 U/L (ref 5–34)
Albumin: 2.8 g/dL — ABNORMAL LOW (ref 3.5–5.0)
Alkaline Phosphatase: 69 U/L (ref 40–150)
BUN: 13.6 mg/dL (ref 7.0–26.0)
CALCIUM: 8.7 mg/dL (ref 8.4–10.4)
CHLORIDE: 107 meq/L (ref 98–109)
CO2: 23 mEq/L (ref 22–29)
Creatinine: 0.8 mg/dL (ref 0.7–1.3)
EGFR: >60 mL/min/{1.73_m2} (ref >60)
Glucose: 156 mg/dl — ABNORMAL HIGH (ref 70–140)
POTASSIUM: 4.2 meq/L (ref 3.5–5.1)
Sodium: 138 mEq/L (ref 136–145)
Total Bilirubin: 0.55 mg/dL (ref 0.20–1.20)
Total Protein: 6 g/dL — ABNORMAL LOW (ref 6.4–8.3)

## 2017-01-23 LAB — CBC WITH DIFFERENTIAL/PLATELET
BASO%: 0.7 % (ref 0.0–2.0)
Basophils Absolute: 0.1 10*3/uL (ref 0.0–0.1)
EOS ABS: 0.4 10*3/uL (ref 0.0–0.5)
EOS%: 5.8 % (ref 0.0–7.0)
HCT: 29.6 % — ABNORMAL LOW (ref 38.4–49.9)
HEMOGLOBIN: 10 g/dL — AB (ref 13.0–17.1)
LYMPH%: 5.2 % — ABNORMAL LOW (ref 14.0–49.0)
MCH: 31.6 pg (ref 27.2–33.4)
MCHC: 33.7 g/dL (ref 32.0–36.0)
MCV: 93.7 fL (ref 79.3–98.0)
MONO#: 0.8 10*3/uL (ref 0.1–0.9)
MONO%: 10.9 % (ref 0.0–14.0)
NEUT%: 77.4 % — ABNORMAL HIGH (ref 39.0–75.0)
NEUTROS ABS: 5.9 10*3/uL (ref 1.5–6.5)
PLATELETS: 123 10*3/uL — AB (ref 140–400)
RBC: 3.16 10*6/uL — ABNORMAL LOW (ref 4.20–5.82)
RDW: 17.7 % — AB (ref 11.0–14.6)
WBC: 7.6 10*3/uL (ref 4.0–10.3)
lymph#: 0.4 10*3/uL — ABNORMAL LOW (ref 0.9–3.3)

## 2017-01-23 LAB — TSH: TSH: 1.033 m(IU)/L (ref 0.320–4.118)

## 2017-01-23 LAB — FUNGAL ORGANISM REFLEX

## 2017-01-23 LAB — FUNGUS CULTURE WITH STAIN

## 2017-01-23 LAB — FUNGUS CULTURE RESULT

## 2017-01-23 MED ORDER — SODIUM CHLORIDE 0.9 % IV SOLN
449.0000 mg | Freq: Once | INTRAVENOUS | Status: AC
  Administered 2017-01-23: 450 mg via INTRAVENOUS
  Filled 2017-01-23: qty 45

## 2017-01-23 MED ORDER — DEXAMETHASONE SODIUM PHOSPHATE 100 MG/10ML IJ SOLN
20.0000 mg | Freq: Once | INTRAMUSCULAR | Status: AC
  Administered 2017-01-23: 20 mg via INTRAVENOUS
  Filled 2017-01-23: qty 2

## 2017-01-23 MED ORDER — SODIUM CHLORIDE 0.9 % IV SOLN
Freq: Once | INTRAVENOUS | Status: AC
  Administered 2017-01-23: 12:00:00 via INTRAVENOUS

## 2017-01-23 MED ORDER — PACLITAXEL CHEMO INJECTION 300 MG/50ML
175.0000 mg/m2 | Freq: Once | INTRAVENOUS | Status: AC
  Administered 2017-01-23: 354 mg via INTRAVENOUS
  Filled 2017-01-23: qty 59

## 2017-01-23 MED ORDER — PEGFILGRASTIM 6 MG/0.6ML ~~LOC~~ PSKT
PREFILLED_SYRINGE | SUBCUTANEOUS | Status: AC
  Filled 2017-01-23: qty 0.6

## 2017-01-23 MED ORDER — PALONOSETRON HCL INJECTION 0.25 MG/5ML
0.2500 mg | Freq: Once | INTRAVENOUS | Status: AC
  Administered 2017-01-23: 0.25 mg via INTRAVENOUS

## 2017-01-23 MED ORDER — PALONOSETRON HCL INJECTION 0.25 MG/5ML
INTRAVENOUS | Status: AC
  Filled 2017-01-23: qty 5

## 2017-01-23 MED ORDER — SODIUM CHLORIDE 0.9% FLUSH
10.0000 mL | INTRAVENOUS | Status: DC | PRN
  Administered 2017-01-23: 10 mL
  Filled 2017-01-23: qty 10

## 2017-01-23 MED ORDER — FAMOTIDINE IN NACL 20-0.9 MG/50ML-% IV SOLN
20.0000 mg | Freq: Once | INTRAVENOUS | Status: AC
  Administered 2017-01-23: 20 mg via INTRAVENOUS

## 2017-01-23 MED ORDER — DIPHENHYDRAMINE HCL 50 MG/ML IJ SOLN
50.0000 mg | Freq: Once | INTRAMUSCULAR | Status: AC
  Administered 2017-01-23: 50 mg via INTRAVENOUS

## 2017-01-23 MED ORDER — PEGFILGRASTIM 6 MG/0.6ML ~~LOC~~ PSKT
6.0000 mg | PREFILLED_SYRINGE | Freq: Once | SUBCUTANEOUS | Status: AC
  Administered 2017-01-23: 6 mg via SUBCUTANEOUS

## 2017-01-23 MED ORDER — SODIUM CHLORIDE 0.9% FLUSH
10.0000 mL | Freq: Once | INTRAVENOUS | Status: AC
  Administered 2017-01-23: 10 mL
  Filled 2017-01-23: qty 10

## 2017-01-23 MED ORDER — HEPARIN SOD (PORK) LOCK FLUSH 100 UNIT/ML IV SOLN
500.0000 [IU] | Freq: Once | INTRAVENOUS | Status: AC | PRN
  Administered 2017-01-23: 500 [IU]
  Filled 2017-01-23: qty 5

## 2017-01-23 MED ORDER — DIPHENHYDRAMINE HCL 50 MG/ML IJ SOLN
INTRAMUSCULAR | Status: AC
  Filled 2017-01-23: qty 1

## 2017-01-23 MED ORDER — FAMOTIDINE IN NACL 20-0.9 MG/50ML-% IV SOLN
INTRAVENOUS | Status: AC
  Filled 2017-01-23: qty 50

## 2017-01-23 NOTE — Progress Notes (Signed)
[facility] [phone]   [phone]  OFFICE PROGRESS NOTE  [name], MD [address] [place] [address]  DIAGNOSIS: Stage IV (T1b, N3, M1c) non-small cell lung cancer, squamous cell carcinoma diagnosed in September 2018 presented with left infrahilar mass in addition to mediastinal and supraclavicular lymphadenopathy as well as metastatic brain lesion.  PDL1 expression 0%.  PRIOR THERAPY: Palliative radiotherapy to the brain metastasis.  CURRENT THERAPY: Systemic chemotherapy with carboplatin for AUC of 5, paclitaxel 175 MG/M2 and Ketruda (pembrolizumab) 200 MG IV every 3 weeks. First dose 11/21/2016.  Status post 2 cycles.  INTERVAL HISTORY: Steve Frank 83 y.o. male returns to the clinic today for follow-up visit accompanied by his wife.  The patient tolerated the last cycle of his systemic chemotherapy fairly well with no significant complaints except for lack of appetite.  He lost a few pounds since his last visit.  He denied having any nausea, vomiting, diarrhea or constipation.  He has no chest pain but continues to have shortness of breath with exertion with mild cough and no hemoptysis.  He has no fever or chills.  The patient is here today for evaluation before starting cycle #3 of his treatment.  MEDICAL HISTORY: Past Medical History:  Diagnosis Date  . Arthritis   . BPH (benign prostatic hyperplasia)   . CAD (coronary artery disease)    s/p 2 cabg's  . Cancer (HCC)    Basal and squamous cell  skin cancer  . Cerebellar mass   . Chest mass   . Diabetes ([place])    Type II  . GERD (gastroesophageal reflux disease)   . Head injury with loss of consciousness [facility])    age 47 or 58-   . Heart attack ([place])   . HLD (hyperlipidemia)   . Hypertension   . Macular degeneration   . Pneumonia     x 2 last 1071  . Stage IV squamous cell carcinoma of left lung ([place]) 10/20/2016    ALLERGIES:  is allergic to  ciprofloxacin.  MEDICATIONS:  Current Outpatient Medications  Medication Sig Dispense Refill  . acetaminophen (TYLENOL) 325 MG tablet Take 650 mg by mouth every 6 (six) hours as needed (for pain/headaches.).     Marland Kitchen albuterol (PROVENTIL HFA;VENTOLIN HFA) 108 (90 Base) MCG/ACT inhaler Inhale 2 puffs into the lungs every 6 (six) hours as needed (for wheezing/shortness of breath).     . B-D INS SYR ULTRAFINE 1CC/31G 31G X 5/16" 1 ML MISC     . carvedilol (COREG) 25 MG tablet Take 25 mg by mouth 2 (two) times daily with a meal.     . Cholecalciferol (VITAMIN D-1000 MAX ST) 1000 units tablet Take 1,000 Units by mouth daily.     . clotrimazole-betamethasone (LOTRISONE) cream Apply 1 application topically 2 (two) times daily.    Marland Kitchen docusate sodium (COLACE) 100 MG capsule Take 100 mg by mouth 2 (two) times daily as needed (for constipation.).    Marland Kitchen gabapentin (NEURONTIN) 400 MG capsule Take 400 mg by mouth at bedtime.    Marland Kitchen guaiFENesin-dextromethorphan (ROBITUSSIN DM) 100-10 MG/5ML syrup Take 15 mLs every 4 (four) hours as needed by mouth for cough.    Marland Kitchen HUMALOG 100 UNIT/ML injection INJECT 2 TO 0.12MLS (2-8 UNITS) UNDER THE SKIN 3 TIMES A DAY BEFORE MEALS, AS PER SLIDING SCALE  0  . LANTUS 100 UNIT/ML injection INJECT 0.15ML (15 UNITS TOTAL) UNDER THE SKIN DAILY AT BEDTIME.  0  . amLODipine (NORVASC) 5 MG tablet Take 5 mg by mouth daily.     Marland Kitchen HYDROcodone-homatropine (HYCODAN) 5-1.5 MG/5ML syrup Take 5 mLs every 6 (six) hours as needed by mouth for cough. (Patient not taking: Reported on 01/23/2017) 120 mL 0  . lidocaine-prilocaine (EMLA) cream Apply 1 application topically as needed. 30 g 0  . magnesium hydroxide (MILK OF MAGNESIA) 400 MG/5ML suspension Take 30 mLs by mouth daily as needed (for constipation.).    Marland Kitchen mirtazapine (REMERON) 15 MG tablet Take 15 mg by mouth at bedtime.  0  . Multiple Vitamin (MULTIVITAMIN WITH MINERALS) TABS tablet Take 1 tablet by mouth daily.    Marland Kitchen  neomycin-bacitracin-polymyxin (NEOSPORIN) [phone] ointment Apply 1 application topically at bedtime.    Marland Kitchen omeprazole (PRILOSEC) 20 MG capsule Take 20 mg by mouth at bedtime.     . polyethylene glycol (MIRALAX / GLYCOLAX) packet Take 17 g by mouth daily as needed (for constipation.).     Marland Kitchen prochlorperazine (COMPAZINE) 10 MG tablet Take 1 tablet (10 mg total) by mouth every 6 (six) hours as needed for nausea or vomiting. 30 tablet 0  . rosuvastatin (CRESTOR) 20 MG tablet Take 20 mg by mouth daily.    . sucralfate (CARAFATE) 1 GM/10ML suspension Take 10 mLs (1 g total) 4 (four) times daily -  with meals and at bedtime by mouth. 420 mL 2  . tamsulosin (FLOMAX) 0.4 MG CAPS capsule Take 0.4 mg by mouth daily.    Marland Kitchen triamcinolone (KENALOG) 0.025 % ointment Apply 1 application 2 (two) times daily topically. 30 g 1   No current facility-administered medications for this visit.     SURGICAL HISTORY:  Past Surgical History:  Procedure Laterality Date  . CARDIAC CATHETERIZATION    . COLONOSCOPY W/ POLYPECTOMY    . CORONARY ARTERY BYPASS GRAFT     Dr Prescott [facility] 07/2007  . CORONARY ARTERY BYPASS GRAFT     1st one was @ [facility] 26years ago 12/1991  . CRANIECTOMY FOR EXCISION OF BRAIN TUMOR INFRATENTORIAL / POSTERIOR FOSSA  08/05/2016   Dr Rebecka Apley  @ [facility]/Neurosurgery  . FLEXIBLE BRONCHOSCOPY  08/07/2016   Dr Gwenevere Ghazi  . IR FLUORO GUIDE PORT INSERTION RIGHT  11/18/2016  . IR US GUIDE VASC ACCESS RIGHT  11/18/2016  . LUNG BIOPSY N/A 10/14/2016   Procedure: LUNG BIOPSY;  Surgeon: Grace Isaac, MD;  Location: [facility];  Service: Thoracic;  Laterality: N/A;  . VIDEO BRONCHOSCOPY WITH ENDOBRONCHIAL ULTRASOUND N/A 10/14/2016   Procedure: VIDEO BRONCHOSCOPY WITH ENDOBRONCHIAL ULTRASOUND;  Surgeon: Grace Isaac, MD;  Location: [facility];  Service: Thoracic;  Laterality: N/A;    REVIEW OF SYSTEMS:  A comprehensive review of systems was negative except for: Constitutional: positive for  fatigue and weight loss Respiratory: positive for dyspnea on exertion   PHYSICAL EXAMINATION: General appearance: alert, cooperative, fatigued and no distress Head: Normocephalic, without obvious abnormality, atraumatic Neck: no adenopathy, no JVD, supple, symmetrical, trachea midline and thyroid not enlarged, symmetric, no tenderness/mass/nodules Lymph nodes: Cervical, supraclavicular, and axillary nodes normal. Resp: clear to auscultation bilaterally Back: symmetric, no curvature. ROM normal. No CVA tenderness. Cardio: regular rate and rhythm, S1, S2 normal, no murmur, click, rub or gallop GI: soft, non-tender; bowel sounds normal; no masses,  no organomegaly Extremities: extremities normal, atraumatic, no cyanosis or edema  ECOG PERFORMANCE STATUS: 1 - Symptomatic but completely ambulatory  Blood pressure (!) 116/53, pulse (!) 56, temperature 97.6 F (36.4 C), temperature source  Oral, resp. rate 17, weight 170 lb 3.2 oz (77.2 kg), SpO2 98 %.  LABORATORY DATA: Lab Results  Component Value Date   WBC 7.6 01/23/2017   HGB 10.0 (L) 01/23/2017   HCT 29.6 (L) 01/23/2017   MCV 93.7 01/23/2017   PLT 123 (L) 01/23/2017      Chemistry      Component Value Date/Time   NA 138 01/23/2017 0944   K 4.2 01/23/2017 0944   CL 112 (H) 12/26/2016 0439   CO2 23 01/23/2017 0944   BUN 13.6 01/23/2017 0944   CREATININE 0.8 01/23/2017 0944      Component Value Date/Time   CALCIUM 8.7 01/23/2017 0944   ALKPHOS 69 01/23/2017 0944   AST 21 01/23/2017 0944   ALT 13 01/23/2017 0944   BILITOT 0.55 01/23/2017 0944       RADIOGRAPHIC STUDIES: No results found.  ASSESSMENT AND PLAN: This is a very pleasant 83 years old white male with metastatic non-small cell lung cancer, squamous cell carcinoma with negative PDL 1 expression and enlarging solitary brain metastasis.  The patient completed palliative radiotherapy to the brain metastasis. He is currently undergoing systemic chemotherapy with  carboplatin, paclitaxel and Keytruda status post 2 cycles.  He has been tolerating this treatment fairly well. I recommended for the patient to proceed with cycle #3 today as a scheduled. I will see him back for follow-up visit in 4 weeks for evaluation after repeating CT scan of the chest, abdomen and pelvis for restaging of his disease. The patient was advised to call immediately if he has any concerning symptoms in the interval. The patient voices understanding of current disease status and treatment options and is in agreement with the current care plan. All questions were answered. The patient knows to call the clinic with any problems, questions or concerns. We can certainly see the patient much sooner if necessary.  Disclaimer: This note was dictated with voice recognition software. Similar sounding words can inadvertently be transcribed and may not be corrected upon review.

## 2017-01-23 NOTE — Progress Notes (Signed)
[facility] Spiritual Care Note  Met Mr and Mrs Saxton in infusion per referral from chemo nurse. Their faith is a big part of their meaning-making, coping, and sense of purpose in the world--and they are also experiencing some grief and disappointment at the lack of support from their church. Per couple, their three local children (fourth lives further away) are very supportive with emotional encouragement and logistical needs.  Mr and Mrs Borquez welcome spiritual conversation and are aware of ongoing chaplain availability. Following for support, but please also page if immediate needs arise.  Thank you.   [place], [place], [facility] Pager [phone] Voicemail [phone]

## 2017-01-23 NOTE — Patient Instructions (Signed)
   [facility] Discharge Instructions for Patients Receiving Chemotherapy  Today you received the following chemotherapy agents Taxol and Carboplatin   To help prevent nausea and vomiting after your treatment, we encourage you to take your nausea medication as directed.    If you develop nausea and vomiting that is not controlled by your nausea medication, call the clinic.   BELOW ARE SYMPTOMS THAT SHOULD BE REPORTED IMMEDIATELY:  *FEVER GREATER THAN 100.5 F  *CHILLS WITH OR WITHOUT FEVER  NAUSEA AND VOMITING THAT IS NOT CONTROLLED WITH YOUR NAUSEA MEDICATION  *UNUSUAL SHORTNESS OF BREATH  *UNUSUAL BRUISING OR BLEEDING  TENDERNESS IN MOUTH AND THROAT WITH OR WITHOUT PRESENCE OF ULCERS  *URINARY PROBLEMS  *BOWEL PROBLEMS  UNUSUAL RASH Items with * indicate a potential emergency and should be followed up as soon as possible.  Feel free to call the clinic should you have any questions or concerns. The clinic phone number is [phone].  Please show the CHEMO ALERT CARD at check-in to the Emergency Department and triage nurse.   

## 2017-01-24 ENCOUNTER — Ambulatory Visit
Admission: RE | Admit: 2017-01-24 | Discharge: 2017-01-24 | Disposition: A | Discharge status: Home / Self Care | Payer: Medicare Other | Source: Ambulatory Visit | Attending: Radiation Oncology | Admitting: Radiation Oncology

## 2017-01-24 ENCOUNTER — Encounter: Payer: Self-pay | Admitting: Urology

## 2017-01-24 ENCOUNTER — Other Ambulatory Visit: Payer: Self-pay

## 2017-01-24 VITALS — BP 139/68 | HR 63 | Temp 97.5°F | Resp 18 | Ht 71.0 in | Wt 170.0 lb

## 2017-01-24 DIAGNOSIS — C7931 Secondary malignant neoplasm of brain: Secondary | ICD-10-CM | POA: Diagnosis present

## 2017-01-24 DIAGNOSIS — Z510 Encounter for antineoplastic radiation therapy: Secondary | ICD-10-CM | POA: Insufficient documentation

## 2017-01-24 NOTE — Progress Notes (Signed)
Radiation Oncology         [phone] ________________________________  Name: DAELYN MOZER MRN: [id number]  Date: 01/24/2017  DOB: May 22, 1931  Post Treatment Note  CC: [name], MD  Grace Isaac, MD  Diagnosis:   83 y.o. gentlemanwith Stage IV non-small cell carcinoma of the left lower lung with solitary brain metastasis   Interval Since Last Radiation:  2 weeks  1.  Brain 11/17/16- Leftcerebellartarget was treated to a prescription dose of 15Gy.  2.  Chest 11/21/2016 - 01/10/2017- The primary tumor in the LLL and involved mediastinal adenopathy were treated to 66 Gy in 33 fractions of 2 Gy.  Narrative:  The patient returns today for routine follow-up.  He tolerated SRS treatment without significant acute effects and was discharged to home in stable condition.    Additionally, he tolerated chest radiation treatment relatively well (chemoradiation).  He experienced some pain and difficulty with swallowing but used Carafate to help with the pain. He also experienced some weight loss related to poor appetite but supplemented his diet with 1 Boost per day. He reported a cough with occasional clear to yellow sputum and fatigue. He used Robitussin DM prn cough with relief.  He denied hemoptysis. He reported shortness of breath on exertion, unchanged.  He has continued on systemic chemotherapy with carboplatin, paclitaxel and Keytruda (first dose was 11/21/16), currently s/p 3 cycles and tolerating well. He was recently evaluated with Dr. Julien Nordmann with plans to continue systemic therapy and repeat imaging with Chest/Abdomen/Pelvis CT in 4 weeks prior to his next scheduled follow up visit.                       On review of systems, the patient states that he is doing well overall.  His only complaint at present is decreased appetite secondary to systemic therapy and he continues to lose weight. He reports resolution of dysphagia and is able to eat and drink as per normal.  He  denies abdominal pain, N/V, diarrhea, or constipation.  He has not had recent fevers, chills or nightsweats.  He denies chest pain, increased SOB, increased productive cough or any hemoptysis. He has chronic productive cough with whitish colored sputum, unchanged recently. He denies headaches, visual or auditory changes, increased weakness/imbalance, difficulty with speech, seizure activity or tremor.  He has continued with imbalance and instability with ambulation but denies any progressive worsening.  In fact, he reports fewer falls at home associated with his imbalance due to being more conscientious and careful when ambulating around the house short distances.  ALLERGIES:  is allergic to ciprofloxacin.  Meds: Current Outpatient Medications  Medication Sig Dispense Refill  . albuterol (PROVENTIL HFA;VENTOLIN HFA) 108 (90 Base) MCG/ACT inhaler Inhale 2 puffs into the lungs every 6 (six) hours as needed (for wheezing/shortness of breath).     . B-D INS SYR ULTRAFINE 1CC/31G 31G X 5/16" 1 ML MISC     . carvedilol (COREG) 25 MG tablet Take 25 mg by mouth 2 (two) times daily with a meal.     . Cholecalciferol (VITAMIN D-1000 MAX ST) 1000 units tablet Take 1,000 Units by mouth daily.     . clotrimazole-betamethasone (LOTRISONE) cream Apply 1 application topically 2 (two) times daily.    Marland Kitchen docusate sodium (COLACE) 100 MG capsule Take 100 mg by mouth 2 (two) times daily as needed (for constipation.).    Marland Kitchen gabapentin (NEURONTIN) 400 MG capsule Take 400 mg by mouth at bedtime.    Marland Kitchen  guaiFENesin-dextromethorphan (ROBITUSSIN DM) 100-10 MG/5ML syrup Take 15 mLs every 4 (four) hours as needed by mouth for cough.    Marland Kitchen HUMALOG 100 UNIT/ML injection INJECT 2 TO 0.12MLS (2-8 UNITS) UNDER THE SKIN 3 TIMES A DAY BEFORE MEALS, AS PER SLIDING SCALE  0  . LANTUS 100 UNIT/ML injection INJECT 0.15ML (15 UNITS TOTAL) UNDER THE SKIN DAILY AT BEDTIME.  0  . lidocaine-prilocaine (EMLA) cream Apply 1 application topically as  needed. 30 g 0  . mirtazapine (REMERON) 15 MG tablet Take 15 mg by mouth at bedtime.  0  . Multiple Vitamin (MULTIVITAMIN WITH MINERALS) TABS tablet Take 1 tablet by mouth daily.    Marland Kitchen neomycin-bacitracin-polymyxin (NEOSPORIN) [phone] ointment Apply 1 application topically at bedtime.    Marland Kitchen omeprazole (PRILOSEC) 20 MG capsule Take 20 mg by mouth at bedtime.     . rosuvastatin (CRESTOR) 20 MG tablet Take 20 mg by mouth daily.    Marland Kitchen acetaminophen (TYLENOL) 325 MG tablet Take 650 mg by mouth every 6 (six) hours as needed (for pain/headaches.).     Marland Kitchen HYDROcodone-homatropine (HYCODAN) 5-1.5 MG/5ML syrup Take 5 mLs every 6 (six) hours as needed by mouth for cough. (Patient not taking: Reported on 01/23/2017) 120 mL 0  . magnesium hydroxide (MILK OF MAGNESIA) 400 MG/5ML suspension Take 30 mLs by mouth daily as needed (for constipation.).    Marland Kitchen polyethylene glycol (MIRALAX / GLYCOLAX) packet Take 17 g by mouth daily as needed (for constipation.).     Marland Kitchen prochlorperazine (COMPAZINE) 10 MG tablet Take 1 tablet (10 mg total) by mouth every 6 (six) hours as needed for nausea or vomiting. (Patient not taking: Reported on 01/24/2017) 30 tablet 0  . sucralfate (CARAFATE) 1 GM/10ML suspension Take 10 mLs (1 g total) 4 (four) times daily -  with meals and at bedtime by mouth. (Patient not taking: Reported on 01/24/2017) 420 mL 2  . tamsulosin (FLOMAX) 0.4 MG CAPS capsule Take 0.4 mg by mouth daily.    Marland Kitchen triamcinolone (KENALOG) 0.025 % ointment Apply 1 application 2 (two) times daily topically. (Patient not taking: Reported on 01/24/2017) 30 g 1   No current facility-administered medications for this encounter.     Physical Findings:  height is 5\' 11"  (1.803 m) and weight is 170 lb (77.1 kg). His oral temperature is 97.5 F (36.4 C) (abnormal). His blood pressure is 139/68 and his pulse is 63. His respiration is 18 and oxygen saturation is 99%.  Pain Assessment Pain Score: 0-No pain/10 In general this is a well  appearing caucasian male in no acute distress. He's alert and oriented x4 and appropriate throughout the examination. Cardiopulmonary assessment is negative for acute distress and he exhibits normal effort.  He appears grossly neurologically intact.  Sensation is intact to light touch and strength is 4/5 and equal bilaterally in the lower extremities.  Lab Findings: Lab Results  Component Value Date   WBC 7.6 01/23/2017   HGB 10.0 (L) 01/23/2017   HCT 29.6 (L) 01/23/2017   MCV 93.7 01/23/2017   PLT 123 (L) 01/23/2017     Radiographic Findings: No results found.  Impression/Plan: 1. 83 y.o. gentleman with Stage IVnon-small cell carcinoma of the left lower lung with solitary brain metastasis.   He was recently evaluated with Dr. Julien Nordmann with plans to continue systemic therapy and repeat imaging with Chest/Abdomen/Pelvis CT in 4 weeks prior to his next scheduled follow up visit.  We will continue to follow him regularly to monitor for any recurrent  or progressive brain disease.  We will arrange for his initial posttreatment brain MRI in 1 month and we will plan to present his case in multidisciplinary brain conference prior to his follow up visit to review results and recommendations. Pending that MRI is stable, we will plan to continue to monitor closely with surveillance MRI of the brain every 3 months.  He appears to have a good understanding of this plan and is in agreement.  He knows to call with any questions or concerns in the interim.    Nicholos Johns, PA-C

## 2017-01-24 NOTE — Addendum Note (Signed)
Encounter addended by: Malena Edman, RN on: 01/24/2017 3:36 PM  Actions taken: Charge Capture section accepted

## 2017-01-25 ENCOUNTER — Ambulatory Visit: Payer: Medicare Other

## 2017-01-25 ENCOUNTER — Telehealth: Payer: Self-pay | Admitting: Internal Medicine

## 2017-01-25 NOTE — Telephone Encounter (Signed)
Called patient to cancel injection per Jenny Reichmann

## 2017-01-25 NOTE — Telephone Encounter (Signed)
Scheduled appts per 12/17 los - spoke with patient regarding appt that was added.

## 2017-02-01 ENCOUNTER — Other Ambulatory Visit: Payer: Self-pay | Admitting: Medical Oncology

## 2017-02-01 ENCOUNTER — Telehealth: Payer: Self-pay | Admitting: Internal Medicine

## 2017-02-01 ENCOUNTER — Telehealth: Payer: Self-pay | Admitting: Medical Oncology

## 2017-02-01 DIAGNOSIS — C3492 Malignant neoplasm of unspecified part of left bronchus or lung: Secondary | ICD-10-CM

## 2017-02-01 NOTE — Telephone Encounter (Signed)
12/17 chemo -  12/20 took MOM and had a hard stool day later. On  12/22-started having watery stool, then plain water. Denies abd pain , no distension. Took imodium on Monday and he continues to have watery stool. I otld wife to give pt another imodiu, and request visit with [facility] tomorrow.

## 2017-02-01 NOTE — Telephone Encounter (Signed)
Scheduled appt per 12/26 sch msg - spoke with patient regarding added appointment.

## 2017-02-02 ENCOUNTER — Ambulatory Visit (HOSPITAL_COMMUNITY)
Admission: RE | Admit: 2017-02-02 | Discharge: 2017-02-02 | Disposition: A | Discharge status: Home / Self Care | Payer: Medicare Other | Source: Ambulatory Visit | Attending: Medical | Admitting: Medical

## 2017-02-02 ENCOUNTER — Other Ambulatory Visit (HOSPITAL_BASED_OUTPATIENT_CLINIC_OR_DEPARTMENT_OTHER): Payer: Medicare Other

## 2017-02-02 ENCOUNTER — Ambulatory Visit (HOSPITAL_BASED_OUTPATIENT_CLINIC_OR_DEPARTMENT_OTHER): Payer: Medicare Other | Admitting: Medical

## 2017-02-02 ENCOUNTER — Other Ambulatory Visit: Payer: Medicare Other

## 2017-02-02 VITALS — BP 78/36 | HR 53 | Temp 97.5°F | Resp 24 | Ht 71.0 in | Wt 169.1 lb

## 2017-02-02 DIAGNOSIS — R630 Anorexia: Secondary | ICD-10-CM | POA: Diagnosis not present

## 2017-02-02 DIAGNOSIS — R159 Full incontinence of feces: Secondary | ICD-10-CM | POA: Diagnosis not present

## 2017-02-02 DIAGNOSIS — I951 Orthostatic hypotension: Secondary | ICD-10-CM

## 2017-02-02 DIAGNOSIS — R001 Bradycardia, unspecified: Secondary | ICD-10-CM

## 2017-02-02 DIAGNOSIS — K5900 Constipation, unspecified: Secondary | ICD-10-CM | POA: Diagnosis present

## 2017-02-02 DIAGNOSIS — C7931 Secondary malignant neoplasm of brain: Secondary | ICD-10-CM | POA: Diagnosis not present

## 2017-02-02 DIAGNOSIS — R197 Diarrhea, unspecified: Secondary | ICD-10-CM | POA: Diagnosis not present

## 2017-02-02 DIAGNOSIS — M7989 Other specified soft tissue disorders: Secondary | ICD-10-CM | POA: Diagnosis not present

## 2017-02-02 DIAGNOSIS — C3492 Malignant neoplasm of unspecified part of left bronchus or lung: Secondary | ICD-10-CM | POA: Diagnosis not present

## 2017-02-02 DIAGNOSIS — R011 Cardiac murmur, unspecified: Secondary | ICD-10-CM | POA: Diagnosis not present

## 2017-02-02 LAB — CBC WITH DIFFERENTIAL/PLATELET
BASO%: 0.5 % (ref 0.0–2.0)
BASOS ABS: 0.1 10*3/uL (ref 0.0–0.1)
EOS ABS: 0.2 10*3/uL (ref 0.0–0.5)
EOS%: 1.5 % (ref 0.0–7.0)
HEMATOCRIT: 26.2 % — AB (ref 38.4–49.9)
HEMOGLOBIN: 8.6 g/dL — AB (ref 13.0–17.1)
LYMPH#: 0.4 10*3/uL — AB (ref 0.9–3.3)
LYMPH%: 3.3 % — ABNORMAL LOW (ref 14.0–49.0)
MCH: 30.9 pg (ref 27.2–33.4)
MCHC: 32.7 g/dL (ref 32.0–36.0)
MCV: 94.6 fL (ref 79.3–98.0)
MONO#: 1.1 10*3/uL — AB (ref 0.1–0.9)
MONO%: 8.3 % (ref 0.0–14.0)
NEUT#: 11.1 10*3/uL — ABNORMAL HIGH (ref 1.5–6.5)
NEUT%: 86.4 % — AB (ref 39.0–75.0)
PLATELETS: 77 10*3/uL — AB (ref 140–400)
RBC: 2.77 10*6/uL — ABNORMAL LOW (ref 4.20–5.82)
RDW: 21.3 % — AB (ref 11.0–14.6)
WBC: 12.8 10*3/uL — ABNORMAL HIGH (ref 4.0–10.3)

## 2017-02-02 LAB — COMPREHENSIVE METABOLIC PANEL
ALBUMIN: 2.9 g/dL — AB (ref 3.5–5.0)
ALK PHOS: 76 U/L (ref 40–150)
ALT: 14 U/L (ref 0–55)
ANION GAP: 10 meq/L (ref 3–11)
AST: 21 U/L (ref 5–34)
BUN: 16.4 mg/dL (ref 7.0–26.0)
CALCIUM: 8.6 mg/dL (ref 8.4–10.4)
CHLORIDE: 107 meq/L (ref 98–109)
CO2: 22 mEq/L (ref 22–29)
CREATININE: 0.9 mg/dL (ref 0.7–1.3)
EGFR: >60 mL/min/{1.73_m2} (ref >60)
Glucose: 164 mg/dl — ABNORMAL HIGH (ref 70–140)
POTASSIUM: 4.4 meq/L (ref 3.5–5.1)
Sodium: 139 mEq/L (ref 136–145)
Total Bilirubin: 0.4 mg/dL (ref 0.20–1.20)
Total Protein: 5.8 g/dL — ABNORMAL LOW (ref 6.4–8.3)

## 2017-02-03 NOTE — Progress Notes (Addendum)
Symptoms Management Clinic Progress Note   [name] [id number] [birth year] 83 y.o.  [name] is managed by Dr. Eilleen Kempf  Actively treated with chemotherapy: yes  Current Therapy: Carboplatin, paclitaxel, and Keytruda  Last Treated: 01/23/2017  Assessment: Plan:    Constipation, unspecified constipation type - Plan: DG Abd 1 View  Incontinence of feces, unspecified fecal incontinence type - Plan: DG Abd 1 View  Orthostasis   Constipation with fecal incontinence after milk of magnesia: KUB returned today showing no evidence of a bowel obstruction.  The patient was instructed to push fluids and to use a stool softener twice daily.  Instructed the patient to use Imodium if needed.  Orthostasis: Directed the patient to push fluids.  Please see After Visit Summary for patient specific instructions.  Future Appointments  Date Time Provider [place]  02/16/2017 11:00 AM GI-315 MR 3 GI-315MRI GI-315 W. WE  02/20/2017  8:00 AM [facility] LAB 1 [facility] None  02/20/2017  8:15 AM [facility] INJ NURSE [facility] None  02/20/2017  8:45 AM Curt Bears, MD [facility] None  02/20/2017  9:30 AM [facility] B5 [facility] None  02/20/2017 11:15 AM Karie Mainland, RD [facility] None  02/21/2017  3:30 PM Bruning, Ashlyn, PA-C [facility] None    Orders Placed This Encounter  Procedures  . DG Abd 1 View      Subjective:   Patient ID:  Steve Frank is a 83 y.o. (DOB 05-Apr-1931) male.  Chief Complaint:  Chief Complaint  Patient presents with  . symptom management    incontenent bowels-watery    HPI Steve Frank is an 83 year old male with a diagnosis of a stage IV (T1b, N3, M1c) non-small cell lung cancer, squamous cell carcinoma diagnosed in September 2018 when he presented with a left infrahilar mass in addition to mediastinal and supraclavicular lymphadenopathy and metastatic brain lesions.  He was previously treated with palliative  radiation to his brain metastasis and most recently was treated with carboplatin and for AUC of 5, paclitaxel 175 mg/m square and Keytruda 200 mg IV every 3 weeks.  His last treatment was given on 01/23/2017.  The patient's wife contacted our office on 02/01/2017 stating that the patient had had constipation after his chemotherapy on 01/23/2017.  On 01/26/2017 he used milk of magnesia and had a hard stool on the following day.  On 01/28/2017 he started having watery stools, then plain water.  The patient denied abdominal pain or distention.  He took Imodium on Monday but continues to have watery stools.  The patient was instructed to take another Imodium yesterday.  He presents to the office today for an evaluation.  He and his wife are concerned that he could become severely constipated and possibly obstructed if he takes additional Imodium.  His last episode of severe diarrhea was 4 days ago.  He has had some small loose stools when he is passing gas.  His appetite is decreased.  He reports of food taste bad.  He is not drink as much fluid as he has been instructed.  He reports some shortness of breath with mild exercise.  He uses a walker at home.  He continues to have neuropathy in his feet.  He denies nausea or vomiting.  Medications: I have reviewed the patient's current medications.  Allergies:  Allergies  Allergen Reactions  . Ciprofloxacin Nausea And Vomiting    Past Medical History:  Diagnosis Date  . Arthritis   . BPH (benign prostatic hyperplasia)   .  CAD (coronary artery disease)    s/p 2 cabg's  . Cancer (HCC)    Basal and squamous cell  skin cancer  . Cerebellar mass   . Chest mass   . Diabetes ([place])    Type II  . GERD (gastroesophageal reflux disease)   . Head injury with loss of consciousness [facility])    age 49 or 78-   . Heart attack ([place])   . HLD (hyperlipidemia)   . Hypertension   . Macular degeneration   . Pneumonia     x 2 last 1071  . Stage IV squamous cell carcinoma  of left lung ([place]) 10/20/2016    Past Surgical History:  Procedure Laterality Date  . CARDIAC CATHETERIZATION    . COLONOSCOPY W/ POLYPECTOMY    . CORONARY ARTERY BYPASS GRAFT     Dr Prescott Gum [facility] 07/2007  . CORONARY ARTERY BYPASS GRAFT     1st one was @ [facility] 26years ago 12/1991  . CRANIECTOMY FOR EXCISION OF BRAIN TUMOR INFRATENTORIAL / POSTERIOR FOSSA  08/05/2016   Dr Rebecka Apley  @ [facility]/Neurosurgery  . FLEXIBLE BRONCHOSCOPY  08/07/2016   Dr Gwenevere Ghazi  . IR FLUORO GUIDE PORT INSERTION RIGHT  11/18/2016  . IR US GUIDE VASC ACCESS RIGHT  11/18/2016  . LUNG BIOPSY N/A 10/14/2016   Procedure: LUNG BIOPSY;  Surgeon: Grace Isaac, MD;  Location: [place];  Service: Thoracic;  Laterality: N/A;  . VIDEO BRONCHOSCOPY WITH ENDOBRONCHIAL ULTRASOUND N/A 10/14/2016   Procedure: VIDEO BRONCHOSCOPY WITH ENDOBRONCHIAL ULTRASOUND;  Surgeon: Grace Isaac, MD;  [facility];  Service: Thoracic;  Laterality: N/A;    Family History  Problem Relation Age of Onset  . Cancer Brother        lung to head and neck involving lymph nodes  . Cancer Paternal Aunt        breast  . Cancer Paternal Uncle        leukemia  . Cancer Paternal Aunt        breast  . Cancer Paternal Uncle        lung    Social History   Socioeconomic History  . Marital status: Married    Spouse name: Not on file  . Number of children: 4  . Years of education: Not on file  . Highest education level: Not on file  Social Needs  . Financial resource strain: Not on file  . Food insecurity - worry: Not on file  . Food insecurity - inability: Not on file  . Transportation needs - medical: Not on file  . Transportation needs - non-medical: Not on file  Occupational History  . Occupation: retired  Tobacco Use  . Smoking status: Former Smoker    Packs/day: 2.00    Years: 35.00    Pack years: 70.00    Types: Cigarettes    Last attempt to quit: 02/07/1985    Years since quitting: 32.0  . Smokeless  tobacco: Former Systems developer  . Tobacco comment: 10/13/16- Quit 31 years ago  Substance and Sexual Activity  . Alcohol use: No  . Drug use: No  . Sexual activity: Not Currently  Other Topics Concern  . Not on file  Social History Narrative  . Not on file    Past Medical History, Surgical history, Social history, and Family history were reviewed and updated as appropriate.   Please see review of systems for further details on the patient's review from today.   Review of Systems:  Review of Systems  Constitutional: Positive for appetite change. Negative for chills, diaphoresis and fever.  HENT: Negative for trouble swallowing.   Respiratory: Positive for shortness of breath. Negative for cough, choking, chest tightness and wheezing.   Cardiovascular: Positive for leg swelling. Negative for chest pain and palpitations.  Gastrointestinal: Positive for constipation and diarrhea. Negative for nausea and vomiting.  Musculoskeletal: Positive for gait problem (The patient is ambulating with a wheelchair today and typically uses a walker at home).    Objective:   Physical Exam:  BP (!) 78/36 (BP Location: Right Arm, Patient Position: Standing)   Pulse (!) 53   Temp (!) 97.5 F (36.4 C) (Oral)   Resp (!) 24   Ht _0  (1.803 m)   Wt 169 lb 1.6 oz (76.7 kg)   SpO2 98%   BMI 23.58 kg/m  ECOG: 1  Orthostatic Blood Pressure: Blood pressure:   sitting 111/54, standing 78/36 Pulse:   sitting 51, standing 53    Physical Exam  Constitutional:  Patient is an elderly male who appears to be in no apparent distress.  HENT:  Head: Normocephalic and atraumatic.  Mouth/Throat: No oropharyngeal exudate.  Patient's oral mucosa appears dry  Eyes: Right eye exhibits no discharge. Left eye exhibits no discharge. No scleral icterus.  Neck: Normal range of motion. Neck supple.  Cardiovascular: Bradycardia present.  Murmur heard.  Systolic murmur is present with a grade of 2/6. Pulmonary/Chest:  Effort normal. No respiratory distress. He has no wheezes. He has no rales.  Abdominal: Soft. Bowel sounds are normal. He exhibits no distension. There is no tenderness. There is no rebound and no guarding.  Lymphadenopathy:    He has no cervical adenopathy.  Neurological: He is alert. Coordination (The patient is ambulating with the use of a wheelchair) abnormal.  Skin: Skin is warm and dry.  Increased tenting of the dorsal surface of the hands noted.    Lab Review:     Component Value Date/Time   NA 139 02/02/2017 1036   K 4.4 02/02/2017 1036   CL 112 (H) 12/26/2016 0439   CO2 22 02/02/2017 1036   GLUCOSE 164 (H) 02/02/2017 1036   BUN 16.4 02/02/2017 1036   CREATININE 0.9 02/02/2017 1036   CALCIUM 8.6 02/02/2017 1036   PROT 5.8 (L) 02/02/2017 1036   ALBUMIN 2.9 (L) 02/02/2017 1036   AST 21 02/02/2017 1036   ALT 14 02/02/2017 1036   ALKPHOS 76 02/02/2017 1036   BILITOT 0.40 02/02/2017 1036   GFRNONAA >60 12/26/2016 0439   GFRAA >60 12/26/2016 0439       Component Value Date/Time   WBC 12.8 (H) 02/02/2017 1036   WBC 9.0 12/26/2016 0439   RBC 2.77 (L) 02/02/2017 1036   RBC 3.72 (L) 12/26/2016 0439   HGB 8.6 (L) 02/02/2017 1036   HCT 26.2 (L) 02/02/2017 1036   PLT 77 (L) 02/02/2017 1036   MCV 94.6 02/02/2017 1036   MCH 30.9 02/02/2017 1036   MCH 30.4 12/26/2016 0439   MCHC 32.7 02/02/2017 1036   MCHC 33.6 12/26/2016 0439   RDW 21.3 (H) 02/02/2017 1036   LYMPHSABS 0.4 (L) 02/02/2017 1036   MONOABS 1.1 (H) 02/02/2017 1036   EOSABS 0.2 02/02/2017 1036   BASOSABS 0.1 02/02/2017 1036   -------------------------------  Imaging from last 24 hours (if applicable):  Radiology interpretation: Dg Abd 1 View  Result Date: 02/02/2017 CLINICAL DATA:  Diarrhea follow by episodes of constipation. History of lung and brain cancer. EXAM: ABDOMEN -  1 VIEW COMPARISON:  09/05/2016 FINDINGS: The bowel gas pattern is normal. No radio-opaque calculi or other significant radiographic  abnormality are seen. IMPRESSION: Negative. Electronically Signed   By: Kerby Moors M.D.   On: 02/02/2017 13:08

## 2017-02-08 ENCOUNTER — Other Ambulatory Visit: Payer: Self-pay | Admitting: Medical Oncology

## 2017-02-08 ENCOUNTER — Ambulatory Visit (HOSPITAL_BASED_OUTPATIENT_CLINIC_OR_DEPARTMENT_OTHER): Payer: Medicare Other

## 2017-02-08 ENCOUNTER — Ambulatory Visit (HOSPITAL_BASED_OUTPATIENT_CLINIC_OR_DEPARTMENT_OTHER): Payer: Medicare Other | Admitting: Medical

## 2017-02-08 ENCOUNTER — Ambulatory Visit (HOSPITAL_COMMUNITY)
Admission: RE | Admit: 2017-02-08 | Discharge: 2017-02-08 | Disposition: A | Discharge status: Home / Self Care | Payer: Medicare Other | Source: Ambulatory Visit | Attending: Medical | Admitting: Medical

## 2017-02-08 ENCOUNTER — Telehealth: Payer: Self-pay | Admitting: *Deleted

## 2017-02-08 ENCOUNTER — Other Ambulatory Visit: Payer: Self-pay | Admitting: *Deleted

## 2017-02-08 ENCOUNTER — Ambulatory Visit (HOSPITAL_COMMUNITY)
Admission: RE | Admit: 2017-02-08 | Discharge: 2017-02-08 | Disposition: A | Discharge status: Home / Self Care | Payer: Medicare Other | Source: Ambulatory Visit | Attending: Internal Medicine | Admitting: Internal Medicine

## 2017-02-08 VITALS — BP 121/54 | HR 55 | Temp 97.7°F | Resp 20 | Ht 71.0 in | Wt 164.3 lb

## 2017-02-08 DIAGNOSIS — J9811 Atelectasis: Secondary | ICD-10-CM | POA: Diagnosis not present

## 2017-02-08 DIAGNOSIS — Z951 Presence of aortocoronary bypass graft: Secondary | ICD-10-CM | POA: Insufficient documentation

## 2017-02-08 DIAGNOSIS — C3490 Malignant neoplasm of unspecified part of unspecified bronchus or lung: Secondary | ICD-10-CM

## 2017-02-08 DIAGNOSIS — C7931 Secondary malignant neoplasm of brain: Secondary | ICD-10-CM

## 2017-02-08 DIAGNOSIS — C3492 Malignant neoplasm of unspecified part of left bronchus or lung: Secondary | ICD-10-CM

## 2017-02-08 DIAGNOSIS — R0602 Shortness of breath: Secondary | ICD-10-CM

## 2017-02-08 DIAGNOSIS — J90 Pleural effusion, not elsewhere classified: Secondary | ICD-10-CM | POA: Diagnosis not present

## 2017-02-08 DIAGNOSIS — I700 Atherosclerosis of aorta: Secondary | ICD-10-CM | POA: Insufficient documentation

## 2017-02-08 LAB — CBC WITH DIFFERENTIAL/PLATELET
BASO%: 0.3 % (ref 0.0–2.0)
Basophils Absolute: 0 10*3/uL (ref 0.0–0.1)
EOS ABS: 0.2 10*3/uL (ref 0.0–0.5)
EOS%: 1.6 % (ref 0.0–7.0)
HEMATOCRIT: 27.2 % — AB (ref 38.4–49.9)
HGB: 9 g/dL — ABNORMAL LOW (ref 13.0–17.1)
LYMPH#: 0.6 10*3/uL — AB (ref 0.9–3.3)
LYMPH%: 6.3 % — AB (ref 14.0–49.0)
MCH: 32.3 pg (ref 27.2–33.4)
MCHC: 33.1 g/dL (ref 32.0–36.0)
MCV: 97.5 fL (ref 79.3–98.0)
MONO#: 0.7 10*3/uL (ref 0.1–0.9)
MONO%: 6.6 % (ref 0.0–14.0)
NEUT#: 8.4 10*3/uL — ABNORMAL HIGH (ref 1.5–6.5)
NEUT%: 85.2 % — AB (ref 39.0–75.0)
PLATELETS: 41 10*3/uL — AB (ref 140–400)
RBC: 2.79 10*6/uL — AB (ref 4.20–5.82)
RDW: 21.9 % — ABNORMAL HIGH (ref 11.0–14.6)
WBC: 9.8 10*3/uL (ref 4.0–10.3)

## 2017-02-08 LAB — COMPREHENSIVE METABOLIC PANEL
ALT: 15 U/L (ref 0–55)
ANION GAP: 8 meq/L (ref 3–11)
AST: 19 U/L (ref 5–34)
Albumin: 3.1 g/dL — ABNORMAL LOW (ref 3.5–5.0)
Alkaline Phosphatase: 72 U/L (ref 40–150)
BUN: 16.4 mg/dL (ref 7.0–26.0)
CALCIUM: 9 mg/dL (ref 8.4–10.4)
CO2: 24 meq/L (ref 22–29)
CREATININE: 0.9 mg/dL (ref 0.7–1.3)
Chloride: 107 mEq/L (ref 98–109)
Glucose: 151 mg/dl — ABNORMAL HIGH (ref 70–140)
Potassium: 4.5 mEq/L (ref 3.5–5.1)
Sodium: 138 mEq/L (ref 136–145)
Total Bilirubin: 0.53 mg/dL (ref 0.20–1.20)
Total Protein: 6.1 g/dL — ABNORMAL LOW (ref 6.4–8.3)

## 2017-02-08 MED ORDER — AZITHROMYCIN 250 MG PO TABS: ORAL_TABLET | ORAL | 0 refills | Status: DC

## 2017-02-08 NOTE — Telephone Encounter (Signed)
Received call from wife Opal requesting a call back from nurse about pt's increased shortness of breath.  Spoke with pt and was informed that pt experiences increased shortness of breath with any activities.  Has to take short frequent breaks even walking to bathroom.   Denied SOB at rest.  Denied bleeding. Instructed pt to come in now for labs, and to wait for further instructions.  Pt voiced understanding, and stated he would come in now.  Collaborative nurse Diane notified. Pt's   Phone    [phone].

## 2017-02-09 ENCOUNTER — Encounter (HOSPITAL_COMMUNITY): Payer: Medicare Other

## 2017-02-10 NOTE — Progress Notes (Signed)
Symptoms Management Clinic Progress Note   Steve Frank [id number] 27-Apr-1931 84 y.o.  Steve Frank is managed by Dr. Eilleen Kempf  Actively treated with chemotherapy: yes  Current Therapy: Carboplatin and paclitaxel  Last Treated: 01/23/2017  Assessment: Plan:    Shortness of breath  Stage IV squamous cell carcinoma of left lung (HCC)   Shortness of breath: A chest x-ray returned showing a negligible increase in a small left pleural effusion with adjacent atelectasis.  A complete blood count returned showing a hemoglobin higher at 9.0 and hematocrit higher at 27.2.  The patient was given a prescription for azithromycin given the fact that he will be out of town for the next week and is having increased shortness of breath.  This was done prophylactically in case the patient was developing an upper respiratory infection.  Stage IV squamous cell carcinoma of the left lung with brain metastasis: [name] is pending a CT of the chest abdomen and pelvis and an MRI of the brain.  He will proceed with the studies and will return on 02/20/2017 for an appointment with Dr. Julien Nordmann.  Please see After Visit Summary for patient specific instructions.  Future Appointments  Date Time Provider [place]  02/16/2017 11:00 AM GI-315 MR 3 GI-315MRI GI-315 W. WE  02/20/2017  8:00 AM [facility] LAB 1 [facility] None  02/20/2017  8:15 AM [facility] INJ NURSE [facility] None  02/20/2017  8:45 AM Curt Bears, MD [facility] None  02/20/2017  9:30 AM [facility] B5 [facility] None  02/20/2017 11:15 AM Karie Mainland, RD [facility] None  02/21/2017  9:30 AM Bruning, Ashlyn, PA-C [facility] None    No orders of the defined types were placed in this encounter.      Subjective:   Patient ID:  Steve Frank is a 84 y.o. (DOB 16-Apr-1931) male.  Chief Complaint:  Chief Complaint  Patient presents with  . Shortness of Breath    HPI CHIEF WALKUP is an 84 year old  male with a diagnosis of a stage IV (T1b, and 3, M1 C) non-small cell lung cancer, squamous cell carcinoma diagnosed in September 2018.  Patient has a left infra hilar mass in addition to mediastinal and supraclavicular lymphadenopathy and metastatic brain lesions.  He has been treated with palliative radiation to the brain.  He has most recently been treated with carboplatin, paclitaxel and Keytruda dosed every 3 weeks.  His last treatment was on 01/23/2017.  He was most recently seen in our office on 02/02/2017 after having a period of severe constipation followed by a watery stools and fecal incontinence after using milk of magnesia.  He presents to the office today with increasing shortness of breath over the last 4-5 days with activity.  He reports that he is noted more shortness of breath when simply getting up to walk to the bathroom or with bathing.  He denies chest pains, fevers, or chills.  He is still concerned regarding constipation but states that he had a small bowel movement 2 days ago.  His appetite is better.  He and his wife plan to leave for 1 week and will travel to the beach.  He is scheduled to have a CT scan of the chest abdomen pelvis and an MRI of the brain next week.    Medications: I have reviewed the patient's current medications.  Allergies:  Allergies  Allergen Reactions  . Ciprofloxacin Nausea And Vomiting    Past Medical History:  Diagnosis Date  .  Arthritis   . BPH (benign prostatic hyperplasia)   . CAD (coronary artery disease)    s/p 2 cabg's  . Cancer (HCC)    Basal and squamous cell  skin cancer  . Cerebellar mass   . Chest mass   . Diabetes ([place])    Type II  . GERD (gastroesophageal reflux disease)   . Head injury with loss of consciousness [facility])    age 84 or 66-   . Heart attack ([place])   . HLD (hyperlipidemia)   . Hypertension   . Macular degeneration   . Pneumonia     x 2 last 1071  . Stage IV squamous cell carcinoma of left lung ([place]) 10/20/2016     Past Surgical History:  Procedure Laterality Date  . CARDIAC CATHETERIZATION    . COLONOSCOPY W/ POLYPECTOMY    . CORONARY ARTERY BYPASS GRAFT     Dr Prescott Gum [facility] 07/2007  . CORONARY ARTERY BYPASS GRAFT     1st one was @ [facility] 26years ago 12/1991  . CRANIECTOMY FOR EXCISION OF BRAIN TUMOR INFRATENTORIAL / POSTERIOR FOSSA  08/05/2016   Dr Rebecka Apley  @ [facility]/Neurosurgery  . FLEXIBLE BRONCHOSCOPY  08/07/2016   Dr Gwenevere Ghazi  . IR FLUORO GUIDE PORT INSERTION RIGHT  11/18/2016  . IR US GUIDE VASC ACCESS RIGHT  11/18/2016  . LUNG BIOPSY N/A 10/14/2016   Procedure: LUNG BIOPSY;  Surgeon: Grace Isaac, MD;  Location: [place];  Service: Thoracic;  Laterality: N/A;  . VIDEO BRONCHOSCOPY WITH ENDOBRONCHIAL ULTRASOUND N/A 10/14/2016   Procedure: VIDEO BRONCHOSCOPY WITH ENDOBRONCHIAL ULTRASOUND;  Surgeon: Grace Isaac, MD;  Location: [facility] OR;  Service: Thoracic;  Laterality: N/A;    Family History  Problem Relation Age of Onset  . Cancer Brother        lung to head and neck involving lymph nodes  . Cancer Paternal Aunt        breast  . Cancer Paternal Uncle        leukemia  . Cancer Paternal Aunt        breast  . Cancer Paternal Uncle        lung    Social History   Socioeconomic History  . Marital status: Married    Spouse name: Not on file  . Number of children: 4  . Years of education: Not on file  . Highest education level: Not on file  Social Needs  . Financial resource strain: Not on file  . Food insecurity - worry: Not on file  . Food insecurity - inability: Not on file  . Transportation needs - medical: Not on file  . Transportation needs - non-medical: Not on file  Occupational History  . Occupation: retired  Tobacco Use  . Smoking status: Former Smoker    Packs/day: 2.00    Years: 35.00    Pack years: 70.00    Types: Cigarettes    Last attempt to quit: 02/07/1985    Years since quitting: 32.0  . Smokeless tobacco: Former Systems developer  . Tobacco  comment: 10/13/16- Quit 31 years ago  Substance and Sexual Activity  . Alcohol use: No  . Drug use: No  . Sexual activity: Not Currently  Other Topics Concern  . Not on file  Social History Narrative  . Not on file    Past Medical History, Surgical history, Social history, and Family history were reviewed and updated as appropriate.   Please see review of systems for further details  on the patient's review from today.   Review of Systems:  Review of Systems  Constitutional: Positive for fatigue. Negative for appetite change, chills, diaphoresis and fever.  HENT: Negative for trouble swallowing.   Respiratory: Positive for shortness of breath. Negative for cough, choking, chest tightness and wheezing.   Cardiovascular: Negative for chest pain, palpitations and leg swelling.  Musculoskeletal: Positive for gait problem (The patient must use a wheelchair or a walker for ambulation due to his gait instability.).    Objective:   Physical Exam:  BP (!) 121/54 (BP Location: Left Arm, Patient Position: Sitting)   Pulse (!) 55   Temp 97.7 F (36.5 C) (Oral)   Resp 20   Ht _0  (1.803 m)   Wt 164 lb 4.8 oz (74.5 kg)   SpO2 99%   BMI 22.92 kg/m  ECOG: 1  Physical Exam  HENT:  Head: Normocephalic and atraumatic.  Mouth/Throat: Oropharynx is clear and moist.  Eyes: Right eye exhibits no discharge. Left eye exhibits no discharge. No scleral icterus.  Neck: Neck supple.  Cardiovascular: Normal rate and regular rhythm. Exam reveals no gallop and no friction rub.  Murmur heard.  Systolic murmur is present with a grade of 2/6. Pulmonary/Chest: Effort normal. No respiratory distress. He has no wheezes. He has no rales.      Lymphadenopathy:    He has no cervical adenopathy.  Neurological: He is alert. Coordination (The patient is ambulating with the use of a wheelchair) abnormal.  Skin: Skin is warm and dry.    Lab Review:     Component Value Date/Time   NA 138 02/08/2017  1118   K 4.5 02/08/2017 1118   CL 112 (H) 12/26/2016 0439   CO2 24 02/08/2017 1118   GLUCOSE 151 (H) 02/08/2017 1118   BUN 16.4 02/08/2017 1118   CREATININE 0.9 02/08/2017 1118   CALCIUM 9.0 02/08/2017 1118   PROT 6.1 (L) 02/08/2017 1118   ALBUMIN 3.1 (L) 02/08/2017 1118   AST 19 02/08/2017 1118   ALT 15 02/08/2017 1118   ALKPHOS 72 02/08/2017 1118   BILITOT 0.53 02/08/2017 1118   GFRNONAA >60 12/26/2016 0439   GFRAA >60 12/26/2016 0439       Component Value Date/Time   WBC 9.8 02/08/2017 1118   WBC 9.0 12/26/2016 0439   RBC 2.79 (L) 02/08/2017 1118   RBC 3.72 (L) 12/26/2016 0439   HGB 9.0 (L) 02/08/2017 1118   HCT 27.2 (L) 02/08/2017 1118   PLT 41 (L) 02/08/2017 1118   MCV 97.5 02/08/2017 1118   MCH 32.3 02/08/2017 1118   MCH 30.4 12/26/2016 0439   MCHC 33.1 02/08/2017 1118   MCHC 33.6 12/26/2016 0439   RDW 21.9 (H) 02/08/2017 1118   LYMPHSABS 0.6 (L) 02/08/2017 1118   MONOABS 0.7 02/08/2017 1118   EOSABS 0.2 02/08/2017 1118   BASOSABS 0.0 02/08/2017 1118   -------------------------------  Imaging from last 24 hours (if applicable):  Radiology interpretation: Dg Chest 1 View  Result Date: 02/08/2017 CLINICAL DATA:  Stage IV squamous cell carcinoma of the left lung. EXAM: CHEST 1 VIEW COMPARISON:  12/22/2016 FINDINGS: Status post CABG. Heart size is within normal limits. There is moderate aortic atherosclerosis at the arch. Port catheter tip terminates in the distal SVC. Slight obscuration of the left hemidiaphragm and blunting of the left lower costophrenic angle consistent with a small left effusion appears minimally more prominent. No pneumothorax or pneumonic consolidations. Mild hyperinflation of the right lung likely accounting for slight  blunting of the right lateral costophrenic angle. No aggressive lytic or blastic disease identified. IMPRESSION: 1. Negligible increase in small left pleural effusion with adjacent atelectasis. 2. Status post CABG with aortic  atherosclerosis. Electronically Signed   By: Ashley Royalty M.D.   On: 02/08/2017 13:35   Dg Abd 1 View  Result Date: 02/02/2017 CLINICAL DATA:  Diarrhea follow by episodes of constipation. History of lung and brain cancer. EXAM: ABDOMEN - 1 VIEW COMPARISON:  09/05/2016 FINDINGS: The bowel gas pattern is normal. No radio-opaque calculi or other significant radiographic abnormality are seen. IMPRESSION: Negative. Electronically Signed   By: Kerby Moors M.D.   On: 02/02/2017 13:08        This case was discussed with Dr. Julien Nordmann. He expressed agreement with my management of this patient.

## 2017-02-14 ENCOUNTER — Ambulatory Visit: Payer: Self-pay | Admitting: Urology

## 2017-02-16 ENCOUNTER — Ambulatory Visit
Admission: RE | Admit: 2017-02-16 | Discharge: 2017-02-16 | Disposition: A | Discharge status: Home / Self Care | Payer: Medicare Other | Source: Ambulatory Visit | Attending: Radiation Oncology | Admitting: Radiation Oncology

## 2017-02-16 DIAGNOSIS — C7931 Secondary malignant neoplasm of brain: Secondary | ICD-10-CM

## 2017-02-16 MED ORDER — GADOBENATE DIMEGLUMINE 529 MG/ML IV SOLN
15.0000 mL | Freq: Once | INTRAVENOUS | Status: AC | PRN
  Administered 2017-02-16: 15 mL via INTRAVENOUS

## 2017-02-16 NOTE — Addendum Note (Signed)
Encounter addended by: Tyler Pita, MD on: 02/16/2017 1:22 PM  Actions taken: Sign clinical note

## 2017-02-17 ENCOUNTER — Ambulatory Visit (HOSPITAL_COMMUNITY)
Admission: RE | Admit: 2017-02-17 | Discharge: 2017-02-17 | Disposition: A | Discharge status: Home / Self Care | Payer: Medicare Other | Source: Ambulatory Visit | Attending: Internal Medicine | Admitting: Internal Medicine

## 2017-02-17 DIAGNOSIS — Y842 Radiological procedure and radiotherapy as the cause of abnormal reaction of the patient, or of later complication, without mention of misadventure at the time of the procedure: Secondary | ICD-10-CM | POA: Insufficient documentation

## 2017-02-17 DIAGNOSIS — J90 Pleural effusion, not elsewhere classified: Secondary | ICD-10-CM | POA: Diagnosis not present

## 2017-02-17 DIAGNOSIS — C3490 Malignant neoplasm of unspecified part of unspecified bronchus or lung: Secondary | ICD-10-CM | POA: Insufficient documentation

## 2017-02-17 DIAGNOSIS — M5384 Other specified dorsopathies, thoracic region: Secondary | ICD-10-CM | POA: Diagnosis not present

## 2017-02-17 DIAGNOSIS — J9811 Atelectasis: Secondary | ICD-10-CM | POA: Insufficient documentation

## 2017-02-17 DIAGNOSIS — R590 Localized enlarged lymph nodes: Secondary | ICD-10-CM | POA: Insufficient documentation

## 2017-02-17 MED ORDER — IOPAMIDOL (ISOVUE-300) INJECTION 61%
INTRAVENOUS | Status: AC
  Filled 2017-02-17: qty 100

## 2017-02-17 MED ORDER — IOPAMIDOL (ISOVUE-300) INJECTION 61%
30.0000 mL | Freq: Once | INTRAVENOUS | Status: AC | PRN
  Administered 2017-02-17: 30 mL via ORAL

## 2017-02-17 MED ORDER — IOPAMIDOL (ISOVUE-300) INJECTION 61%
INTRAVENOUS | Status: AC
  Filled 2017-02-17: qty 30

## 2017-02-17 MED ORDER — IOPAMIDOL (ISOVUE-300) INJECTION 61%
100.0000 mL | Freq: Once | INTRAVENOUS | Status: AC | PRN
  Administered 2017-02-17: 100 mL via INTRAVENOUS

## 2017-02-20 ENCOUNTER — Inpatient Hospital Stay: Payer: Medicare Other

## 2017-02-20 ENCOUNTER — Telehealth: Payer: Self-pay | Admitting: Internal Medicine

## 2017-02-20 ENCOUNTER — Inpatient Hospital Stay: Payer: Medicare Other | Admitting: Nutrition

## 2017-02-20 ENCOUNTER — Encounter: Payer: Self-pay | Admitting: Internal Medicine

## 2017-02-20 ENCOUNTER — Inpatient Hospital Stay: Payer: Medicare Other | Attending: Internal Medicine | Admitting: Internal Medicine

## 2017-02-20 VITALS — BP 103/52 | HR 54 | Temp 97.6°F | Resp 20 | Ht 71.0 in | Wt 170.6 lb

## 2017-02-20 DIAGNOSIS — Z5112 Encounter for antineoplastic immunotherapy: Secondary | ICD-10-CM | POA: Insufficient documentation

## 2017-02-20 DIAGNOSIS — Z79899 Other long term (current) drug therapy: Secondary | ICD-10-CM | POA: Diagnosis not present

## 2017-02-20 DIAGNOSIS — D6481 Anemia due to antineoplastic chemotherapy: Secondary | ICD-10-CM | POA: Diagnosis not present

## 2017-02-20 DIAGNOSIS — Z923 Personal history of irradiation: Secondary | ICD-10-CM | POA: Diagnosis not present

## 2017-02-20 DIAGNOSIS — I10 Essential (primary) hypertension: Secondary | ICD-10-CM

## 2017-02-20 DIAGNOSIS — R5383 Other fatigue: Secondary | ICD-10-CM | POA: Diagnosis not present

## 2017-02-20 DIAGNOSIS — C7931 Secondary malignant neoplasm of brain: Secondary | ICD-10-CM | POA: Diagnosis not present

## 2017-02-20 DIAGNOSIS — R531 Weakness: Secondary | ICD-10-CM | POA: Diagnosis not present

## 2017-02-20 DIAGNOSIS — Z5111 Encounter for antineoplastic chemotherapy: Secondary | ICD-10-CM

## 2017-02-20 DIAGNOSIS — R5382 Chronic fatigue, unspecified: Secondary | ICD-10-CM

## 2017-02-20 DIAGNOSIS — C3412 Malignant neoplasm of upper lobe, left bronchus or lung: Secondary | ICD-10-CM | POA: Insufficient documentation

## 2017-02-20 DIAGNOSIS — J90 Pleural effusion, not elsewhere classified: Secondary | ICD-10-CM

## 2017-02-20 DIAGNOSIS — Z95828 Presence of other vascular implants and grafts: Secondary | ICD-10-CM

## 2017-02-20 DIAGNOSIS — C3492 Malignant neoplasm of unspecified part of left bronchus or lung: Secondary | ICD-10-CM

## 2017-02-20 LAB — CBC WITH DIFFERENTIAL/PLATELET
BASOS PCT: 1 %
Basophils Absolute: 0.1 10*3/uL (ref 0.0–0.1)
EOS ABS: 0.8 10*3/uL — AB (ref 0.0–0.5)
EOS PCT: 12 %
HCT: 27.2 % — ABNORMAL LOW (ref 38.4–49.9)
Hemoglobin: 8.8 g/dL — ABNORMAL LOW (ref 13.0–17.1)
Lymphocytes Relative: 6 %
Lymphs Abs: 0.4 10*3/uL — ABNORMAL LOW (ref 0.9–3.3)
MCH: 33.6 pg — AB (ref 27.2–33.4)
MCHC: 32.4 g/dL (ref 32.0–36.0)
MCV: 103.8 fL — ABNORMAL HIGH (ref 79.3–98.0)
MONOS PCT: 12 %
Monocytes Absolute: 0.8 10*3/uL (ref 0.1–0.9)
NEUTROS PCT: 69 %
Neutro Abs: 4.7 10*3/uL (ref 1.5–6.5)
PLATELETS: 118 10*3/uL — AB (ref 140–400)
RBC: 2.62 MIL/uL — ABNORMAL LOW (ref 4.20–5.82)
RDW: 24.9 % — AB (ref 11.0–15.6)
WBC: 6.6 10*3/uL (ref 4.0–10.3)

## 2017-02-20 LAB — COMPREHENSIVE METABOLIC PANEL
ALK PHOS: 52 U/L (ref 40–150)
ALT: 16 U/L (ref 0–55)
AST: 22 U/L (ref 5–34)
Albumin: 2.9 g/dL — ABNORMAL LOW (ref 3.5–5.0)
Anion gap: 8 (ref 3–11)
BUN: 19 mg/dL (ref 7–26)
CALCIUM: 8.7 mg/dL (ref 8.4–10.4)
CO2: 24 mmol/L (ref 22–29)
CREATININE: 0.86 mg/dL (ref 0.70–1.30)
Chloride: 107 mmol/L (ref 98–109)
GFR calc Af Amer: >60 mL/min (ref >60)
Glucose, Bld: 165 mg/dL — ABNORMAL HIGH (ref 70–140)
Potassium: 4.2 mmol/L (ref 3.5–5.1)
SODIUM: 139 mmol/L (ref 136–145)
Total Bilirubin: 0.6 mg/dL (ref 0.2–1.2)
Total Protein: 5.5 g/dL — ABNORMAL LOW (ref 6.4–8.3)

## 2017-02-20 LAB — TSH: TSH: 0.977 u[IU]/mL (ref 0.320–4.118)

## 2017-02-20 MED ORDER — SODIUM CHLORIDE 0.9 % IV SOLN
Freq: Once | INTRAVENOUS | Status: AC
  Administered 2017-02-20: 09:00:00 via INTRAVENOUS

## 2017-02-20 MED ORDER — PEGFILGRASTIM 6 MG/0.6ML ~~LOC~~ PSKT
PREFILLED_SYRINGE | SUBCUTANEOUS | Status: AC
  Filled 2017-02-20: qty 0.6

## 2017-02-20 MED ORDER — DIPHENHYDRAMINE HCL 50 MG/ML IJ SOLN
INTRAMUSCULAR | Status: AC
  Filled 2017-02-20: qty 1

## 2017-02-20 MED ORDER — SODIUM CHLORIDE 0.9 % IV SOLN
20.0000 mg | Freq: Once | INTRAVENOUS | Status: AC
  Administered 2017-02-20: 20 mg via INTRAVENOUS
  Filled 2017-02-20: qty 2

## 2017-02-20 MED ORDER — SODIUM CHLORIDE 0.9 % IV SOLN
200.0000 mg | Freq: Once | INTRAVENOUS | Status: AC
  Administered 2017-02-20: 200 mg via INTRAVENOUS
  Filled 2017-02-20: qty 8

## 2017-02-20 MED ORDER — SODIUM CHLORIDE 0.9 % IV SOLN
449.0000 mg | Freq: Once | INTRAVENOUS | Status: AC
  Administered 2017-02-20: 450 mg via INTRAVENOUS
  Filled 2017-02-20: qty 45

## 2017-02-20 MED ORDER — SODIUM CHLORIDE 0.9% FLUSH
10.0000 mL | Freq: Once | INTRAVENOUS | Status: AC
  Administered 2017-02-20: 10 mL
  Filled 2017-02-20: qty 10

## 2017-02-20 MED ORDER — FAMOTIDINE IN NACL 20-0.9 MG/50ML-% IV SOLN
INTRAVENOUS | Status: AC
  Filled 2017-02-20: qty 50

## 2017-02-20 MED ORDER — HEPARIN SOD (PORK) LOCK FLUSH 100 UNIT/ML IV SOLN
500.0000 [IU] | Freq: Once | INTRAVENOUS | Status: AC | PRN
  Administered 2017-02-20: 500 [IU]
  Filled 2017-02-20: qty 5

## 2017-02-20 MED ORDER — PEGFILGRASTIM 6 MG/0.6ML ~~LOC~~ PSKT
6.0000 mg | PREFILLED_SYRINGE | Freq: Once | SUBCUTANEOUS | Status: AC
  Administered 2017-02-20: 6 mg via SUBCUTANEOUS

## 2017-02-20 MED ORDER — FAMOTIDINE IN NACL 20-0.9 MG/50ML-% IV SOLN
20.0000 mg | Freq: Once | INTRAVENOUS | Status: AC
  Administered 2017-02-20: 20 mg via INTRAVENOUS

## 2017-02-20 MED ORDER — DIPHENHYDRAMINE HCL 50 MG/ML IJ SOLN
50.0000 mg | Freq: Once | INTRAMUSCULAR | Status: AC
  Administered 2017-02-20: 50 mg via INTRAVENOUS

## 2017-02-20 MED ORDER — SODIUM CHLORIDE 0.9% FLUSH
10.0000 mL | INTRAVENOUS | Status: DC | PRN
  Administered 2017-02-20: 10 mL
  Filled 2017-02-20: qty 10

## 2017-02-20 MED ORDER — PALONOSETRON HCL INJECTION 0.25 MG/5ML
0.2500 mg | Freq: Once | INTRAVENOUS | Status: AC
  Administered 2017-02-20: 0.25 mg via INTRAVENOUS

## 2017-02-20 MED ORDER — PALONOSETRON HCL INJECTION 0.25 MG/5ML
INTRAVENOUS | Status: AC
  Filled 2017-02-20: qty 5

## 2017-02-20 MED ORDER — SODIUM CHLORIDE 0.9 % IV SOLN
175.0000 mg/m2 | Freq: Once | INTRAVENOUS | Status: AC
  Administered 2017-02-20: 354 mg via INTRAVENOUS
  Filled 2017-02-20: qty 59

## 2017-02-20 NOTE — Progress Notes (Signed)
Radiation Oncology         [phone] ________________________________  Name: Steve Frank MRN: [id number]  Date: 02/21/2017  DOB: Nov 21, 1931  Post Treatment Note  CC: [name], MD  Grace Isaac, MD  Diagnosis:   84 y.o. 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  Brain 11/17/16- Leftcerebellartarget was treated to a prescription dose of 15Gy.  2.  Chest 11/21/2016 - 01/10/2017- The primary tumor in the LLL and involved mediastinal adenopathy were treated to 66 Gy in 33 fractions of 2 Gy.  Narrative:  The patient returns today for routine follow-up and to review results from his recent post-treatment brain MRI.  He has recovered well from the effects of radiotherapy and is without complaints at this point.  He has continued on systemic chemotherapy with carboplatin, paclitaxel and Keytruda (first dose was 11/21/16), currently s/p 4 cycles and tolerating well. He was recently evaluated with Dr. Julien Nordmann and his recent systemic imaging with Chest/Abdomen/Pelvis CT on 02/17/17 shows significant improvement in his disease.  The plan is to continue systemic therapy with paclitaxel and Keytruda.                       On review of systems, the patient states that he is doing well overall.  His only complaint at present is generalized fatigue, weakness and decreased appetite which he attributes to ongoing systemic therapy. His weight has stabilized. He reports resolution of dysphagia and is able to eat and drink as per normal.  He denies abdominal pain, N/V, diarrhea, or constipation.  He has not had recent fevers, chills or nightsweats.  He denies chest pain, increased SOB, increased productive cough or any hemoptysis. He has chronic productive cough with whitish colored sputum, unchanged recently. He denies headaches, visual or auditory  changes, increased weakness/imbalance, difficulty with speech, seizure activity or tremor.  He has continued with left sided weakness, imbalance and instability with ambulation but denies any progressive worsening.  He continues working with PT to regain strength and balance. In fact, he reports fewer falls at home associated with his imbalance due to being more conscientious and careful when ambulating around the house short distances. He uses a rolling walker for safety around the house.  ALLERGIES:  is allergic to ciprofloxacin.  Meds: Current Outpatient Medications  Medication Sig Dispense Refill  . albuterol (PROVENTIL HFA;VENTOLIN HFA) 108 (90 Base) MCG/ACT inhaler Inhale 2 puffs into the lungs every 6 (six) hours as needed (for wheezing/shortness of breath).     Marland Kitchen amLODipine (NORVASC) 5 MG tablet Take 1 tablet by mouth daily.    . B-D INS SYR ULTRAFINE 1CC/31G 31G X 5/16" 1 ML MISC     . carvedilol (COREG) 25 MG tablet Take 25 mg by mouth 2 (two) times daily with a meal.     . Cholecalciferol (VITAMIN D-1000 MAX ST) 1000 units tablet Take 1,000 Units by mouth daily.     . clotrimazole-betamethasone (LOTRISONE) cream Apply 1 application topically 2 (two) times daily.    Marland Kitchen docusate sodium (COLACE) 100 MG capsule Take 100 mg by mouth 2 (two) times daily as needed (for constipation.).    Marland Kitchen gabapentin (NEURONTIN) 400 MG capsule Take 400 mg by mouth at bedtime.    Marland Kitchen guaiFENesin-dextromethorphan (ROBITUSSIN DM) 100-10 MG/5ML syrup Take 15 mLs every 4 (four)  hours as needed by mouth for cough.    Marland Kitchen HUMALOG 100 UNIT/ML injection INJECT 2 TO 0.12MLS (2-8 UNITS) UNDER THE SKIN 3 TIMES A DAY BEFORE MEALS, AS PER SLIDING SCALE  0  . LANTUS 100 UNIT/ML injection INJECT 0.15ML (15 UNITS TOTAL) UNDER THE SKIN DAILY AT BEDTIME.  0  . lidocaine-prilocaine (EMLA) cream Apply 1 application topically as needed. 30 g 0  . mirtazapine (REMERON) 15 MG tablet Take 15 mg by mouth at bedtime.  0  . Multiple Vitamin  (MULTIVITAMIN WITH MINERALS) TABS tablet Take 1 tablet by mouth daily.    Marland Kitchen neomycin-bacitracin-polymyxin (NEOSPORIN) [phone] ointment Apply 1 application topically at bedtime.    Marland Kitchen omeprazole (PRILOSEC) 20 MG capsule Take 20 mg by mouth at bedtime.     . polyethylene glycol (MIRALAX / GLYCOLAX) packet Take 17 g by mouth daily as needed (for constipation.).     Marland Kitchen rosuvastatin (CRESTOR) 20 MG tablet Take 20 mg by mouth daily.    Marland Kitchen triamcinolone (KENALOG) 0.025 % ointment Apply 1 application 2 (two) times daily topically. 30 g 1  . acetaminophen (TYLENOL) 325 MG tablet Take 650 mg by mouth every 6 (six) hours as needed (for pain/headaches.).     Marland Kitchen magnesium hydroxide (MILK OF MAGNESIA) 400 MG/5ML suspension Take 30 mLs by mouth daily as needed (for constipation.).    Marland Kitchen prochlorperazine (COMPAZINE) 10 MG tablet Take 1 tablet (10 mg total) by mouth every 6 (six) hours as needed for nausea or vomiting. (Patient not taking: Reported on 01/24/2017) 30 tablet 0   No current facility-administered medications for this encounter.     Physical Findings:  height is 5\' 11"  (1.803 m) and weight is 164 lb 3.2 oz (74.5 kg). His oral temperature is 97.6 F (36.4 C). His blood pressure is 111/58 (abnormal) and his pulse is 58 (abnormal). His respiration is 18 and oxygen saturation is 100%.  Pain Assessment Pain Score: 0-No pain/10 In general this is a well appearing caucasian male in no acute distress. He's alert and oriented x4 and appropriate throughout the examination. Cardiopulmonary assessment is negative for acute distress and he exhibits normal effort.  He appears grossly neurologically intact.  Sensation is intact to light touch and strength is 4/5 and equal bilaterally in the lower extremities.  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 EXAM: CHEST 1 VIEW COMPARISON:  12/22/2016 FINDINGS: Status post CABG. Heart size is within normal limits. There is moderate aortic atherosclerosis at the arch. Port catheter tip terminates in the distal SVC. Slight obscuration of the left hemidiaphragm and blunting of the left lower costophrenic angle consistent with a small left effusion appears minimally more prominent. No pneumothorax or pneumonic consolidations. Mild hyperinflation of the right lung likely accounting for slight blunting of the right lateral costophrenic angle. No aggressive lytic or blastic disease identified. IMPRESSION: 1. Negligible increase in small left pleural effusion with adjacent atelectasis. 2. Status post CABG with aortic atherosclerosis. Electronically Signed   By: Ashley Royalty M.D.   On: 02/08/2017 13:35   Dg Abd 1 View  Result Date: 02/02/2017 CLINICAL DATA:  Diarrhea follow by episodes of constipation. History of lung and  brain cancer. EXAM: ABDOMEN - 1 VIEW COMPARISON:  09/05/2016 FINDINGS: The bowel gas pattern is normal. No radio-opaque calculi or other significant radiographic abnormality are seen. IMPRESSION: Negative. Electronically Signed   By: Kerby Moors M.D.   On: 02/02/2017 13:08   Ct Chest W Contrast  Result Date: 02/18/2017 CLINICAL DATA:  Restaging lung cancer. Patient has known brain metastasis. EXAM: CT CHEST, ABDOMEN, AND PELVIS WITH CONTRAST TECHNIQUE: Multidetector CT imaging of the chest, abdomen and pelvis was performed following the standard protocol during bolus administration of intravenous contrast. CONTRAST:  162mL ISOVUE-300 IOPAMIDOL (ISOVUE-300) INJECTION 61% COMPARISON:  CT scan 08/04/2016 and PET-CT 09/20/2016 FINDINGS: CT CHEST FINDINGS Cardiovascular: The heart is normal in size. No pericardial effusion. Stable tortuosity and dense calcification of the thoracic aorta  and branch vessels including three-vessel coronary artery calcifications. Stable surgical changes from bypass surgery. Mediastinum/Nodes: Interval regression of mediastinal and hilar lymphadenopathy. 14 mm precarinal lymph node now measures 7 mm. Large left hilar nodal mass previously measured 5.6 x 3.4 cm and now measures a maximum of 15 mm. The 13.5 cm right-sided subcarinal adenopathy previously measured 17 mm. Lungs/Pleura: Stable emphysematous changes. The left lower lobe lung mass is not identified. There are changes of radiation fibrosis involving the left lower lobe. There is a moderate left-sided pleural effusion with overlying atelectasis. Small right pleural effusion with overlying atelectasis. No obvious pleural nodules. Musculoskeletal: No chest wall mass, supraclavicular or axillary lymphadenopathy. Small thyroid gland nodules are stable. Right Port-A-Cath is in good position. New sclerotic changes at T5 and T6 with a linear band of gas in the T6 vertebral body. Findings could be due to avascular necrosis/Kummel's disease. Metastatic disease is also possible. MRI of the thoracic spine without and with contrast may be helpful for further evaluation. I do not see any other obvious bone lesions. CT ABDOMEN PELVIS FINDINGS Hepatobiliary: No focal hepatic lesions to suggest metastatic disease. The gallbladder is normal. No common bile duct dilatation. Pancreas: No mass, inflammation or ductal dilatation. Spleen: Normal size.  No focal lesions. Adrenals/Urinary Tract: The adrenal glands and kidneys are unremarkable. Stable renal scarring changes and renal cortical thinning. No worrisome bladder lesions. Stomach/Bowel: The stomach, duodenum, small bowel and colon are grossly normal. No acute inflammatory changes, mass lesions or obstructive findings. Moderate stool noted throughout the colon. Vascular/Lymphatic: Advanced atherosclerotic calcifications involving the aorta and branch vessels. No aneurysm or  dissection. The major venous structures are patent. No mesenteric or retroperitoneal mass or adenopathy. Small scattered lymph nodes are stable. Reproductive: The prostate gland and seminal vesicles are unremarkable. Other: No pelvic mass or adenopathy. No free pelvic fluid collections. No inguinal mass or adenopathy. No abdominal wall hernia or subcutaneous lesions. Musculoskeletal: Advanced degenerative changes involving the spine and osteoporosis but no obvious bone lesions. IMPRESSION: 1. Significant interval regression of left lower lobe lung lesion and left hilar/infrahilar mass. 2. Regression of mediastinal lymphadenopathy. 3. Radiation changes involving the left lower lobe. 4. Moderate-sized left effusion and small right effusion with overlying atelectasis. 5. No findings for abdominal/pelvic metastatic disease. 6. New sclerotic changes at T5 and T6. Findings could be due to avascular necrosis/kummel's disease. Metastatic disease is also possible. MRI thoracic spine without with contrast may be helpful for further evaluation. Electronically Signed   By: Marijo Sanes M.D.   On: 02/18/2017 19:44   Mr Jeri Cos TK Contrast  Result Date: 02/16/2017 CLINICAL DATA:  S RS restaging.  Left cerebellar mass. EXAM: MRI HEAD WITHOUT AND WITH CONTRAST TECHNIQUE:  Multiplanar, multiecho pulse sequences of the brain and surrounding structures were obtained without and with intravenous contrast. CONTRAST:  18mL MULTIHANCE GADOBENATE DIMEGLUMINE 529 MG/ML IV SOLN COMPARISON:  11/02/2016.  08/06/2016. FINDINGS: Brain: No foci of restricted diffusion. Cerebral hemispheres again show moderate chronic small-vessel ischemic changes. No large vessel territory insult. In the posterior fossa, the patient has had left-sided craniotomy and partial resection of the left cerebellum. There has been successful interval treatment of the large left cerebellar lesion. There is atrophy and gliosis in the left cerebellar hemisphere. Large  tumor mass is completely gone. There are 2 punctate foci of enhancement on the left at the middle cerebellar peduncle and medial left cerebellar hemisphere. No mass effect. No sign of vasogenic edema. Residual hemosiderin related to previous treatment. No new lesion elsewhere. Vascular: Major vessels at the base of the brain show flow. Skull and upper cervical spine: Otherwise negative Sinuses/Orbits: Clear/normal Other: None IMPRESSION: Excellent post treatment appearance. Large left cerebellar tumor mass completely inapparent presently. Atrophy and gliosis of the left cerebellar hemisphere. Two punctate foci of enhancement, 1 in the left middle cerebellar peduncle in the other in the medial inferior cerebellar hemisphere which may relate to previous treatment and can be followed on subsequent examinations. Electronically Signed   By: Nelson Chimes M.D.   On: 02/16/2017 13:09   Ct Abdomen Pelvis W Contrast  Result Date: 02/18/2017 CLINICAL DATA:  Restaging lung cancer. Patient has known brain metastasis. EXAM: CT CHEST, ABDOMEN, AND PELVIS WITH CONTRAST TECHNIQUE: Multidetector CT imaging of the chest, abdomen and pelvis was performed following the standard protocol during bolus administration of intravenous contrast. CONTRAST:  143mL ISOVUE-300 IOPAMIDOL (ISOVUE-300) INJECTION 61% COMPARISON:  CT scan 08/04/2016 and PET-CT 09/20/2016 FINDINGS: CT CHEST FINDINGS Cardiovascular: The heart is normal in size. No pericardial effusion. Stable tortuosity and dense calcification of the thoracic aorta and branch vessels including three-vessel coronary artery calcifications. Stable surgical changes from bypass surgery. Mediastinum/Nodes: Interval regression of mediastinal and hilar lymphadenopathy. 14 mm precarinal lymph node now measures 7 mm. Large left hilar nodal mass previously measured 5.6 x 3.4 cm and now measures a maximum of 15 mm. The 13.5 cm right-sided subcarinal adenopathy previously measured 17 mm.  Lungs/Pleura: Stable emphysematous changes. The left lower lobe lung mass is not identified. There are changes of radiation fibrosis involving the left lower lobe. There is a moderate left-sided pleural effusion with overlying atelectasis. Small right pleural effusion with overlying atelectasis. No obvious pleural nodules. Musculoskeletal: No chest wall mass, supraclavicular or axillary lymphadenopathy. Small thyroid gland nodules are stable. Right Port-A-Cath is in good position. New sclerotic changes at T5 and T6 with a linear band of gas in the T6 vertebral body. Findings could be due to avascular necrosis/Kummel's disease. Metastatic disease is also possible. MRI of the thoracic spine without and with contrast may be helpful for further evaluation. I do not see any other obvious bone lesions. CT ABDOMEN PELVIS FINDINGS Hepatobiliary: No focal hepatic lesions to suggest metastatic disease. The gallbladder is normal. No common bile duct dilatation. Pancreas: No mass, inflammation or ductal dilatation. Spleen: Normal size.  No focal lesions. Adrenals/Urinary Tract: The adrenal glands and kidneys are unremarkable. Stable renal scarring changes and renal cortical thinning. No worrisome bladder lesions. Stomach/Bowel: The stomach, duodenum, small bowel and colon are grossly normal. No acute inflammatory changes, mass lesions or obstructive findings. Moderate stool noted throughout the colon. Vascular/Lymphatic: Advanced atherosclerotic calcifications involving the aorta and branch vessels. No aneurysm or dissection. The major  venous structures are patent. No mesenteric or retroperitoneal mass or adenopathy. Small scattered lymph nodes are stable. Reproductive: The prostate gland and seminal vesicles are unremarkable. Other: No pelvic mass or adenopathy. No free pelvic fluid collections. No inguinal mass or adenopathy. No abdominal wall hernia or subcutaneous lesions. Musculoskeletal: Advanced degenerative changes  involving the spine and osteoporosis but no obvious bone lesions. IMPRESSION: 1. Significant interval regression of left lower lobe lung lesion and left hilar/infrahilar mass. 2. Regression of mediastinal lymphadenopathy. 3. Radiation changes involving the left lower lobe. 4. Moderate-sized left effusion and small right effusion with overlying atelectasis. 5. No findings for abdominal/pelvic metastatic disease. 6. New sclerotic changes at T5 and T6. Findings could be due to avascular necrosis/kummel's disease. Metastatic disease is also possible. MRI thoracic spine without with contrast may be helpful for further evaluation. Electronically Signed   By: Marijo Sanes M.D.   On: 02/18/2017 19:44    Impression/Plan: 1. 84 y.o. gentleman with Stage IVnon-small cell carcinoma of the left lower lung with solitary brain metastasis.    Recent post-treatment brain MRI from 02/16/17 shows an excellent response to treatment with the large left cerebellar tumor mass which was recently treated, completely inapparent presently. There is atrophy and gliosis of the left cerebellar hemisphere and two punctate foci of enhancement, 1 in the left middle cerebellar peduncle in the other in the medial inferior cerebellar hemisphere which may relate to previous treatment and can be followed on subsequent examinations.  He was recently evaluated with Dr. Julien Nordmann and follow up systemic imaging indicates significant improvement in his overall disease.  The plan is to continue systemic therapy. We will continue to follow him regularly to monitor for any recurrent or progressive brain disease.  We will plan to continue to monitor closely with surveillance MRI of the brain every 3 months.  He appears to have a good understanding of this plan and is in agreement.  He knows to call with any questions or concerns in the interim.    Nicholos Johns, PA-C

## 2017-02-20 NOTE — Progress Notes (Signed)
[place] [phone]   [phone]  OFFICE PROGRESS NOTE  [name], MD [address] [place] [address]  DIAGNOSIS: Stage IV (T1b, N3, M1c) non-small cell lung cancer, squamous cell carcinoma diagnosed in September 2018 presented with left infrahilar mass in addition to mediastinal and supraclavicular lymphadenopathy as well as metastatic brain lesion.  PDL1 expression 0%.  PRIOR THERAPY: Palliative radiotherapy to the brain metastasis.  CURRENT THERAPY: Systemic chemotherapy with carboplatin for AUC of 5, paclitaxel 175 MG/M2 and Ketruda (pembrolizumab) 200 MG IV every 3 weeks. First dose 11/21/2016.  Status post 3 cycles.  INTERVAL HISTORY: Steve Frank 84 y.o. male returns to the clinic today for follow-up visit accompanied by his wife.  The patient is feeling fine today with no specific complaints except for fatigue and generalized weakness.  He tolerated the last cycle of his systemic chemotherapy with carboplatin, paclitaxel and Keytruda fairly well.  He denied having any significant nausea or vomiting.  He has no fever or chills.  He has no chest pain, shortness breath, cough or hemoptysis.  He had a repeat CT scan of the chest, abdomen and pelvis performed recently and he is here for evaluation and discussion of his discuss results.  MEDICAL HISTORY: Past Medical History:  Diagnosis Date  . Arthritis   . BPH (benign prostatic hyperplasia)   . CAD (coronary artery disease)    s/p 2 cabg's  . Cancer (HCC)    Basal and squamous cell  skin cancer  . Cerebellar mass   . Chest mass   . Diabetes ([place])    Type II  . GERD (gastroesophageal reflux disease)   . Head injury with loss of consciousness [facility])    age 57 or 41-   . Heart attack ([place])   . HLD (hyperlipidemia)   . Hypertension   . Macular degeneration   . Pneumonia     x 2 last 1071  . Stage IV squamous cell carcinoma of left lung ([place]) 10/20/2016    ALLERGIES:   is allergic to ciprofloxacin.  MEDICATIONS:  Current Outpatient Medications  Medication Sig Dispense Refill  . acetaminophen (TYLENOL) 325 MG tablet Take 650 mg by mouth every 6 (six) hours as needed (for pain/headaches.).     Marland Kitchen albuterol (PROVENTIL HFA;VENTOLIN HFA) 108 (90 Base) MCG/ACT inhaler Inhale 2 puffs into the lungs every 6 (six) hours as needed (for wheezing/shortness of breath).     Marland Kitchen azithromycin (ZITHROMAX Z-PAK) 250 MG tablet 2 tablets day 1 and then 1 tablet days 2 through 5 6 each 0  . B-D INS SYR ULTRAFINE 1CC/31G 31G X 5/16" 1 ML MISC     . carvedilol (COREG) 25 MG tablet Take 25 mg by mouth 2 (two) times daily with a meal.     . Cholecalciferol (VITAMIN D-1000 MAX ST) 1000 units tablet Take 1,000 Units by mouth daily.     . clotrimazole-betamethasone (LOTRISONE) cream Apply 1 application topically 2 (two) times daily.    Marland Kitchen docusate sodium (COLACE) 100 MG capsule Take 100 mg by mouth 2 (two) times daily as needed (for constipation.).    Marland Kitchen gabapentin (NEURONTIN) 400 MG capsule Take 400 mg by mouth at bedtime.    Marland Kitchen guaiFENesin-dextromethorphan (ROBITUSSIN DM) 100-10 MG/5ML syrup Take 15 mLs every 4 (four) hours as needed by mouth for cough.    Marland Kitchen HUMALOG 100 UNIT/ML injection INJECT 2 TO 0.12MLS (2-8 UNITS) UNDER THE SKIN 3 TIMES A DAY  BEFORE MEALS, AS PER SLIDING SCALE  0  . HYDROcodone-homatropine (HYCODAN) 5-1.5 MG/5ML syrup Take 5 mLs every 6 (six) hours as needed by mouth for cough. (Patient not taking: Reported on 01/23/2017) 120 mL 0  . LANTUS 100 UNIT/ML injection INJECT 0.15ML (15 UNITS TOTAL) UNDER THE SKIN DAILY AT BEDTIME.  0  . lidocaine-prilocaine (EMLA) cream Apply 1 application topically as needed. 30 g 0  . magnesium hydroxide (MILK OF MAGNESIA) 400 MG/5ML suspension Take 30 mLs by mouth daily as needed (for constipation.).    Marland Kitchen mirtazapine (REMERON) 15 MG tablet Take 15 mg by mouth at bedtime.  0  . Multiple Vitamin (MULTIVITAMIN WITH MINERALS) TABS tablet Take  1 tablet by mouth daily.    Marland Kitchen neomycin-bacitracin-polymyxin (NEOSPORIN) [phone] ointment Apply 1 application topically at bedtime.    Marland Kitchen omeprazole (PRILOSEC) 20 MG capsule Take 20 mg by mouth at bedtime.     . polyethylene glycol (MIRALAX / GLYCOLAX) packet Take 17 g by mouth daily as needed (for constipation.).     Marland Kitchen prochlorperazine (COMPAZINE) 10 MG tablet Take 1 tablet (10 mg total) by mouth every 6 (six) hours as needed for nausea or vomiting. (Patient not taking: Reported on 01/24/2017) 30 tablet 0  . rosuvastatin (CRESTOR) 20 MG tablet Take 20 mg by mouth daily.    . sucralfate (CARAFATE) 1 GM/10ML suspension Take 10 mLs (1 g total) 4 (four) times daily -  with meals and at bedtime by mouth. (Patient not taking: Reported on 01/24/2017) 420 mL 2  . triamcinolone (KENALOG) 0.025 % ointment Apply 1 application 2 (two) times daily topically. 30 g 1   No current facility-administered medications for this visit.     SURGICAL HISTORY:  Past Surgical History:  Procedure Laterality Date  . CARDIAC CATHETERIZATION    . COLONOSCOPY W/ POLYPECTOMY    . CORONARY ARTERY BYPASS GRAFT     Dr Prescott Gum [facility] 07/2007  . CORONARY ARTERY BYPASS GRAFT     1st one was @ [facility] 26years ago 12/1991  . CRANIECTOMY FOR EXCISION OF BRAIN TUMOR INFRATENTORIAL / POSTERIOR FOSSA  08/05/2016   Dr Rebecka Apley  @ [facility]/Neurosurgery  . FLEXIBLE BRONCHOSCOPY  08/07/2016   Dr Gwenevere Ghazi  . IR FLUORO GUIDE PORT INSERTION RIGHT  11/18/2016  . IR US GUIDE VASC ACCESS RIGHT  11/18/2016  . LUNG BIOPSY N/A 10/14/2016   Procedure: LUNG BIOPSY;  Surgeon: Grace Isaac, MD;  Location: [facility];  Service: Thoracic;  Laterality: N/A;  . VIDEO BRONCHOSCOPY WITH ENDOBRONCHIAL ULTRASOUND N/A 10/14/2016   Procedure: VIDEO BRONCHOSCOPY WITH ENDOBRONCHIAL ULTRASOUND;  Surgeon: Grace Isaac, MD;  Location: [place];  Service: Thoracic;  Laterality: N/A;    REVIEW OF SYSTEMS:  Constitutional: positive for  fatigue Eyes: negative Ears, nose, mouth, throat, and face: negative Respiratory: negative Cardiovascular: negative Gastrointestinal: negative Genitourinary:negative Integument/breast: negative Hematologic/lymphatic: negative Musculoskeletal:positive for muscle weakness Neurological: negative Behavioral/Psych: negative Endocrine: negative Allergic/Immunologic: negative   PHYSICAL EXAMINATION: General appearance: alert, cooperative, fatigued and no distress Head: Normocephalic, without obvious abnormality, atraumatic Neck: no adenopathy, no JVD, supple, symmetrical, trachea midline and thyroid not enlarged, symmetric, no tenderness/mass/nodules Lymph nodes: Cervical, supraclavicular, and axillary nodes normal. Resp: clear to auscultation bilaterally Back: symmetric, no curvature. ROM normal. No CVA tenderness. Cardio: regular rate and rhythm, S1, S2 normal, no murmur, click, rub or gallop GI: soft, non-tender; bowel sounds normal; no masses,  no organomegaly Extremities: extremities normal, atraumatic, no cyanosis or edema Neurologic: Alert and oriented X  3, normal strength and tone. Normal symmetric reflexes. Normal coordination and gait  ECOG PERFORMANCE STATUS: 1 - Symptomatic but completely ambulatory  Blood pressure (!) 103/52, pulse (!) 54, temperature 97.6 F (36.4 C), temperature source Oral, resp. rate 20, height _0  (1.803 m), weight 170 lb 9.6 oz (77.4 kg), SpO2 98 %.  LABORATORY DATA: Lab Results  Component Value Date   WBC 6.6 02/20/2017   HGB 8.8 (L) 02/20/2017   HCT 27.2 (L) 02/20/2017   MCV 103.8 (H) 02/20/2017   PLT 118 (L) 02/20/2017      Chemistry      Component Value Date/Time   NA 138 02/08/2017 1118   K 4.5 02/08/2017 1118   CL 112 (H) 12/26/2016 0439   CO2 24 02/08/2017 1118   BUN 16.4 02/08/2017 1118   CREATININE 0.9 02/08/2017 1118      Component Value Date/Time   CALCIUM 9.0 02/08/2017 1118   ALKPHOS 72 02/08/2017 1118   AST 19  02/08/2017 1118   ALT 15 02/08/2017 1118   BILITOT 0.53 02/08/2017 1118       RADIOGRAPHIC STUDIES: Dg Chest 1 View  Result Date: 02/08/2017 CLINICAL DATA:  Stage IV squamous cell carcinoma of the left lung. EXAM: CHEST 1 VIEW COMPARISON:  12/22/2016 FINDINGS: Status post CABG. Heart size is within normal limits. There is moderate aortic atherosclerosis at the arch. Port catheter tip terminates in the distal SVC. Slight obscuration of the left hemidiaphragm and blunting of the left lower costophrenic angle consistent with a small left effusion appears minimally more prominent. No pneumothorax or pneumonic consolidations. Mild hyperinflation of the right lung likely accounting for slight blunting of the right lateral costophrenic angle. No aggressive lytic or blastic disease identified. IMPRESSION: 1. Negligible increase in small left pleural effusion with adjacent atelectasis. 2. Status post CABG with aortic atherosclerosis. Electronically Signed   By: Ashley Royalty M.D.   On: 02/08/2017 13:35   Dg Abd 1 View  Result Date: 02/02/2017 CLINICAL DATA:  Diarrhea follow by episodes of constipation. History of lung and brain cancer. EXAM: ABDOMEN - 1 VIEW COMPARISON:  09/05/2016 FINDINGS: The bowel gas pattern is normal. No radio-opaque calculi or other significant radiographic abnormality are seen. IMPRESSION: Negative. Electronically Signed   By: Kerby Moors M.D.   On: 02/02/2017 13:08   Ct Chest W Contrast  Result Date: 02/18/2017 CLINICAL DATA:  Restaging lung cancer. Patient has known brain metastasis. EXAM: CT CHEST, ABDOMEN, AND PELVIS WITH CONTRAST TECHNIQUE: Multidetector CT imaging of the chest, abdomen and pelvis was performed following the standard protocol during bolus administration of intravenous contrast. CONTRAST:  134m ISOVUE-300 IOPAMIDOL (ISOVUE-300) INJECTION 61% COMPARISON:  CT scan 08/04/2016 and PET-CT 09/20/2016 FINDINGS: CT CHEST FINDINGS Cardiovascular: The heart is normal in  size. No pericardial effusion. Stable tortuosity and dense calcification of the thoracic aorta and branch vessels including three-vessel coronary artery calcifications. Stable surgical changes from bypass surgery. Mediastinum/Nodes: Interval regression of mediastinal and hilar lymphadenopathy. 14 mm precarinal lymph node now measures 7 mm. Large left hilar nodal mass previously measured 5.6 x 3.4 cm and now measures a maximum of 15 mm. The 13.5 cm right-sided subcarinal adenopathy previously measured 17 mm. Lungs/Pleura: Stable emphysematous changes. The left lower lobe lung mass is not identified. There are changes of radiation fibrosis involving the left lower lobe. There is a moderate left-sided pleural effusion with overlying atelectasis. Small right pleural effusion with overlying atelectasis. No obvious pleural nodules. Musculoskeletal: No chest wall mass, supraclavicular or  axillary lymphadenopathy. Small thyroid gland nodules are stable. Right Port-A-Cath is in good position. New sclerotic changes at T5 and T6 with a linear band of gas in the T6 vertebral body. Findings could be due to avascular necrosis/Kummel's disease. Metastatic disease is also possible. MRI of the thoracic spine without and with contrast may be helpful for further evaluation. I do not see any other obvious bone lesions. CT ABDOMEN PELVIS FINDINGS Hepatobiliary: No focal hepatic lesions to suggest metastatic disease. The gallbladder is normal. No common bile duct dilatation. Pancreas: No mass, inflammation or ductal dilatation. Spleen: Normal size.  No focal lesions. Adrenals/Urinary Tract: The adrenal glands and kidneys are unremarkable. Stable renal scarring changes and renal cortical thinning. No worrisome bladder lesions. Stomach/Bowel: The stomach, duodenum, small bowel and colon are grossly normal. No acute inflammatory changes, mass lesions or obstructive findings. Moderate stool noted throughout the colon. Vascular/Lymphatic:  Advanced atherosclerotic calcifications involving the aorta and branch vessels. No aneurysm or dissection. The major venous structures are patent. No mesenteric or retroperitoneal mass or adenopathy. Small scattered lymph nodes are stable. Reproductive: The prostate gland and seminal vesicles are unremarkable. Other: No pelvic mass or adenopathy. No free pelvic fluid collections. No inguinal mass or adenopathy. No abdominal wall hernia or subcutaneous lesions. Musculoskeletal: Advanced degenerative changes involving the spine and osteoporosis but no obvious bone lesions. IMPRESSION: 1. Significant interval regression of left lower lobe lung lesion and left hilar/infrahilar mass. 2. Regression of mediastinal lymphadenopathy. 3. Radiation changes involving the left lower lobe. 4. Moderate-sized left effusion and small right effusion with overlying atelectasis. 5. No findings for abdominal/pelvic metastatic disease. 6. New sclerotic changes at T5 and T6. Findings could be due to avascular necrosis/kummel's disease. Metastatic disease is also possible. MRI thoracic spine without with contrast may be helpful for further evaluation. Electronically Signed   By: Marijo Sanes M.D.   On: 02/18/2017 19:44   Mr Jeri Cos QH Contrast  Result Date: 02/16/2017 CLINICAL DATA:  S RS restaging.  Left cerebellar mass. EXAM: MRI HEAD WITHOUT AND WITH CONTRAST TECHNIQUE: Multiplanar, multiecho pulse sequences of the brain and surrounding structures were obtained without and with intravenous contrast. CONTRAST:  56m MULTIHANCE GADOBENATE DIMEGLUMINE 529 MG/ML IV SOLN COMPARISON:  11/02/2016.  08/06/2016. FINDINGS: Brain: No foci of restricted diffusion. Cerebral hemispheres again show moderate chronic small-vessel ischemic changes. No large vessel territory insult. In the posterior fossa, the patient has had left-sided craniotomy and partial resection of the left cerebellum. There has been successful interval treatment of the large  left cerebellar lesion. There is atrophy and gliosis in the left cerebellar hemisphere. Large tumor mass is completely gone. There are 2 punctate foci of enhancement on the left at the middle cerebellar peduncle and medial left cerebellar hemisphere. No mass effect. No sign of vasogenic edema. Residual hemosiderin related to previous treatment. No new lesion elsewhere. Vascular: Major vessels at the base of the brain show flow. Skull and upper cervical spine: Otherwise negative Sinuses/Orbits: Clear/normal Other: None IMPRESSION: Excellent post treatment appearance. Large left cerebellar tumor mass completely inapparent presently. Atrophy and gliosis of the left cerebellar hemisphere. Two punctate foci of enhancement, 1 in the left middle cerebellar peduncle in the other in the medial inferior cerebellar hemisphere which may relate to previous treatment and can be followed on subsequent examinations. Electronically Signed   By: MNelson ChimesM.D.   On: 02/16/2017 13:09   Ct Abdomen Pelvis W Contrast  Result Date: 02/18/2017 CLINICAL DATA:  Restaging lung cancer. Patient  has known brain metastasis. EXAM: CT CHEST, ABDOMEN, AND PELVIS WITH CONTRAST TECHNIQUE: Multidetector CT imaging of the chest, abdomen and pelvis was performed following the standard protocol during bolus administration of intravenous contrast. CONTRAST:  171m ISOVUE-300 IOPAMIDOL (ISOVUE-300) INJECTION 61% COMPARISON:  CT scan 08/04/2016 and PET-CT 09/20/2016 FINDINGS: CT CHEST FINDINGS Cardiovascular: The heart is normal in size. No pericardial effusion. Stable tortuosity and dense calcification of the thoracic aorta and branch vessels including three-vessel coronary artery calcifications. Stable surgical changes from bypass surgery. Mediastinum/Nodes: Interval regression of mediastinal and hilar lymphadenopathy. 14 mm precarinal lymph node now measures 7 mm. Large left hilar nodal mass previously measured 5.6 x 3.4 cm and now measures a  maximum of 15 mm. The 13.5 cm right-sided subcarinal adenopathy previously measured 17 mm. Lungs/Pleura: Stable emphysematous changes. The left lower lobe lung mass is not identified. There are changes of radiation fibrosis involving the left lower lobe. There is a moderate left-sided pleural effusion with overlying atelectasis. Small right pleural effusion with overlying atelectasis. No obvious pleural nodules. Musculoskeletal: No chest wall mass, supraclavicular or axillary lymphadenopathy. Small thyroid gland nodules are stable. Right Port-A-Cath is in good position. New sclerotic changes at T5 and T6 with a linear band of gas in the T6 vertebral body. Findings could be due to avascular necrosis/Kummel's disease. Metastatic disease is also possible. MRI of the thoracic spine without and with contrast may be helpful for further evaluation. I do not see any other obvious bone lesions. CT ABDOMEN PELVIS FINDINGS Hepatobiliary: No focal hepatic lesions to suggest metastatic disease. The gallbladder is normal. No common bile duct dilatation. Pancreas: No mass, inflammation or ductal dilatation. Spleen: Normal size.  No focal lesions. Adrenals/Urinary Tract: The adrenal glands and kidneys are unremarkable. Stable renal scarring changes and renal cortical thinning. No worrisome bladder lesions. Stomach/Bowel: The stomach, duodenum, small bowel and colon are grossly normal. No acute inflammatory changes, mass lesions or obstructive findings. Moderate stool noted throughout the colon. Vascular/Lymphatic: Advanced atherosclerotic calcifications involving the aorta and branch vessels. No aneurysm or dissection. The major venous structures are patent. No mesenteric or retroperitoneal mass or adenopathy. Small scattered lymph nodes are stable. Reproductive: The prostate gland and seminal vesicles are unremarkable. Other: No pelvic mass or adenopathy. No free pelvic fluid collections. No inguinal mass or adenopathy. No  abdominal wall hernia or subcutaneous lesions. Musculoskeletal: Advanced degenerative changes involving the spine and osteoporosis but no obvious bone lesions. IMPRESSION: 1. Significant interval regression of left lower lobe lung lesion and left hilar/infrahilar mass. 2. Regression of mediastinal lymphadenopathy. 3. Radiation changes involving the left lower lobe. 4. Moderate-sized left effusion and small right effusion with overlying atelectasis. 5. No findings for abdominal/pelvic metastatic disease. 6. New sclerotic changes at T5 and T6. Findings could be due to avascular necrosis/kummel's disease. Metastatic disease is also possible. MRI thoracic spine without with contrast may be helpful for further evaluation. Electronically Signed   By: PMarijo SanesM.D.   On: 02/18/2017 19:44    ASSESSMENT AND PLAN: This is a very pleasant 83years old white male with metastatic non-small cell lung cancer, squamous cell carcinoma with negative PDL 1 expression and enlarging solitary brain metastasis.  The patient completed palliative radiotherapy to the brain metastasis. He is currently undergoing systemic chemotherapy with carboplatin, paclitaxel and Keytruda status post 3 cycles.   He tolerated the last cycle of his systemic chemotherapy fairly well except for fatigue. The patient had repeat CT scan of the chest, abdomen and pelvis.  I personally and independently reviewed the scan images and discussed the result with the patient and his wife.  His a scan showed significant improvement of his disease. I recommended for the patient to continue his current treatment with carboplatin, paclitaxel and Keytruda with cycle #4 today as a schedule. Starting from cycle #5 the patient will be treated with single agent Keytruda. I will see him back for follow-up visit in 3 weeks for evaluation before the next cycle of his treatment. For the chemotherapy-induced anemia, we will continue to monitor him closely and consider  the patient for PRBCs transfusion if his hemoglobin is less than 8.0 G/DL. The patient was advised to call immediately if he has any concerning symptoms in the interval. The patient voices understanding of current disease status and treatment options and is in agreement with the current care plan. All questions were answered. The patient knows to call the clinic with any problems, questions or concerns. We can certainly see the patient much sooner if necessary.  Disclaimer: This note was dictated with voice recognition software. Similar sounding words can inadvertently be transcribed and may not be corrected upon review.

## 2017-02-20 NOTE — Telephone Encounter (Signed)
Gave avs and calendar for february °

## 2017-02-20 NOTE — Patient Instructions (Signed)
Implanted Port Home Guide An implanted port is a type of central line that is placed under the skin. Central lines are used to provide IV access when treatment or nutrition needs to be given through a person's veins. Implanted ports are used for long-term IV access. An implanted port may be placed because:  You need IV medicine that would be irritating to the small veins in your hands or arms.  You need long-term IV medicines, such as antibiotics.  You need IV nutrition for a long period.  You need frequent blood draws for lab tests.  You need dialysis.  Implanted ports are usually placed in the chest area, but they can also be placed in the upper arm, the abdomen, or the leg. An implanted port has two main parts:  Reservoir. The reservoir is round and will appear as a small, raised area under your skin. The reservoir is the part where a needle is inserted to give medicines or draw blood.  Catheter. The catheter is a thin, flexible tube that extends from the reservoir. The catheter is placed into a large vein. Medicine that is inserted into the reservoir goes into the catheter and then into the vein.  How will I care for my incision site? Do not get the incision site wet. Bathe or shower as directed by your health care provider. How is my port accessed? Special steps must be taken to access the port:  Before the port is accessed, a numbing cream can be placed on the skin. This helps numb the skin over the port site.  Your health care provider uses a sterile technique to access the port. ? Your health care provider must put on a mask and sterile gloves. ? The skin over your port is cleaned carefully with an antiseptic and allowed to dry. ? The port is gently pinched between sterile gloves, and a needle is inserted into the port.  Only "non-coring" port needles should be used to access the port. Once the port is accessed, a blood return should be checked. This helps ensure that the port  is in the vein and is not clogged.  If your port needs to remain accessed for a constant infusion, a clear (transparent) bandage will be placed over the needle site. The bandage and needle will need to be changed every week, or as directed by your health care provider.  Keep the bandage covering the needle clean and dry. Do not get it wet. Follow your health care provider's instructions on how to take a shower or bath while the port is accessed.  If your port does not need to stay accessed, no bandage is needed over the port.  What is flushing? Flushing helps keep the port from getting clogged. Follow your health care provider's instructions on how and when to flush the port. Ports are usually flushed with saline solution or a medicine called heparin. The need for flushing will depend on how the port is used.  If the port is used for intermittent medicines or blood draws, the port will need to be flushed: ? After medicines have been given. ? After blood has been drawn. ? As part of routine maintenance.  If a constant infusion is running, the port may not need to be flushed.  How long will my port stay implanted? The port can stay in for as long as your health care provider thinks it is needed. When it is time for the port to come out, surgery will be   done to remove it. The procedure is similar to the one performed when the port was put in. When should I seek immediate medical care? When you have an implanted port, you should seek immediate medical care if:  You notice a bad smell coming from the incision site.  You have swelling, redness, or drainage at the incision site.  You have more swelling or pain at the port site or the surrounding area.  You have a fever that is not controlled with medicine.  This information is not intended to replace advice given to you by your health care provider. Make sure you discuss any questions you have with your health care provider. Document  Released: 01/24/2005 Document Revised: 07/02/2015 Document Reviewed: 10/01/2012 Elsevier Interactive Patient Education  2017 [employer]  

## 2017-02-20 NOTE — Patient Instructions (Signed)
[facility] Discharge Instructions for Patients Receiving Chemotherapy  Today you received the following chemotherapy agents:  Taxol and Carboplatin and Keytruda.  To help prevent nausea and vomiting after your treatment, we encourage you to take your nausea medication as directed.   If you develop nausea and vomiting that is not controlled by your nausea medication, call the clinic.   BELOW ARE SYMPTOMS THAT SHOULD BE REPORTED IMMEDIATELY:  *FEVER GREATER THAN 100.5 F  *CHILLS WITH OR WITHOUT FEVER  NAUSEA AND VOMITING THAT IS NOT CONTROLLED WITH YOUR NAUSEA MEDICATION  *UNUSUAL SHORTNESS OF BREATH  *UNUSUAL BRUISING OR BLEEDING  TENDERNESS IN MOUTH AND THROAT WITH OR WITHOUT PRESENCE OF ULCERS  *URINARY PROBLEMS  *BOWEL PROBLEMS  UNUSUAL RASH Items with * indicate a potential emergency and should be followed up as soon as possible.  Feel free to call the clinic should you have any questions or concerns. The clinic phone number is [phone].  Please show the [facility] at check-in to the Emergency Department and triage nurse.

## 2017-02-20 NOTE — Progress Notes (Signed)
84 year old male diagnosed with stage IV non-small cell lung cancer in September 2018  Past medical history includes BPH, CAD status post CABG, diabetes type 2, GERD, hyperlipidemia, hypertension.  Medications include vitamin D, Colace, Humalog, Lantus, milk of magnesia, Remeron, Prilosec, Compazine and Crestor.  Labs include glucose 165 and albumin 2.9 on January 14.  Height: 5 feet 11 inches. Weight: 170.6 pounds January 14. Usual body weight: 190 pounds. BMI: 23.79.  Patient complains of taste alterations resulting in decreased oral intake. He and wife both report that he feels his appetite has improved. He occasionally has nausea. He is trying to keep his blood sugars controlled. Nutrition focused physical exam was deferred.  Nutrition diagnosis:  Unintended weight loss related to new diagnosis of non-small cell lung cancer as evidenced by 10% weight loss over 4 months.  Intervention: Educated patient on small frequent meals and snacks utilizing high-calorie, high-protein foods. Encouraged patient to consume oral nutrition supplements as tolerated. Educated patient on strategies for improving taste.  Provided fact sheet. I answered questions.  Teach back method was used.  Contact information was given.  Monitoring, evaluation, goals: Patient will tolerate adequate calories and protein for weight maintenance.  **Disclaimer: This note was dictated with voice recognition software. Similar sounding words can inadvertently be transcribed and this note may contain transcription errors which may not have been corrected upon publication of note.**

## 2017-02-21 ENCOUNTER — Ambulatory Visit
Admission: RE | Admit: 2017-02-21 | Discharge: 2017-02-21 | Disposition: A | Discharge status: Home / Self Care | Payer: Medicare Other | Source: Ambulatory Visit | Attending: Urology | Admitting: Urology

## 2017-02-21 ENCOUNTER — Encounter: Payer: Self-pay | Admitting: Urology

## 2017-02-21 ENCOUNTER — Other Ambulatory Visit: Payer: Self-pay

## 2017-02-21 VITALS — BP 111/58 | HR 58 | Temp 97.6°F | Resp 18 | Ht 71.0 in | Wt 164.2 lb

## 2017-02-21 DIAGNOSIS — C7931 Secondary malignant neoplasm of brain: Secondary | ICD-10-CM | POA: Diagnosis present

## 2017-02-21 DIAGNOSIS — Z510 Encounter for antineoplastic radiation therapy: Secondary | ICD-10-CM | POA: Diagnosis not present

## 2017-02-27 ENCOUNTER — Inpatient Hospital Stay: Payer: Medicare Other

## 2017-02-27 DIAGNOSIS — R5382 Chronic fatigue, unspecified: Secondary | ICD-10-CM

## 2017-02-27 DIAGNOSIS — C3492 Malignant neoplasm of unspecified part of left bronchus or lung: Secondary | ICD-10-CM

## 2017-02-27 DIAGNOSIS — Z5112 Encounter for antineoplastic immunotherapy: Secondary | ICD-10-CM | POA: Diagnosis not present

## 2017-02-27 LAB — CBC WITH DIFFERENTIAL/PLATELET
Basophils Absolute: 0 10*3/uL (ref 0.0–0.1)
Basophils Relative: 2 %
EOS PCT: 17 %
Eosinophils Absolute: 0.4 10*3/uL (ref 0.0–0.5)
HCT: 25 % — ABNORMAL LOW (ref 38.4–49.9)
HEMOGLOBIN: 8.1 g/dL — AB (ref 13.0–17.1)
LYMPHS PCT: 9 %
Lymphs Abs: 0.2 10*3/uL — ABNORMAL LOW (ref 0.9–3.3)
MCH: 33.6 pg — AB (ref 27.2–33.4)
MCHC: 32.4 g/dL (ref 32.0–36.0)
MCV: 103.7 fL — AB (ref 79.3–98.0)
MONOS PCT: 22 %
Monocytes Absolute: 0.5 10*3/uL (ref 0.1–0.9)
NEUTROS PCT: 50 %
Neutro Abs: 1.2 10*3/uL — ABNORMAL LOW (ref 1.5–6.5)
Platelets: 45 10*3/uL — ABNORMAL LOW (ref 140–400)
RBC: 2.41 MIL/uL — AB (ref 4.20–5.82)
RDW: 22.3 % — ABNORMAL HIGH (ref 11.0–15.6)
WBC: 2.4 10*3/uL — AB (ref 4.0–10.3)

## 2017-02-27 LAB — TSH: TSH: 1.335 u[IU]/mL (ref 0.320–4.118)

## 2017-02-27 LAB — COMPREHENSIVE METABOLIC PANEL
ALT: 14 U/L (ref 0–55)
AST: 17 U/L (ref 5–34)
Albumin: 3.1 g/dL — ABNORMAL LOW (ref 3.5–5.0)
Alkaline Phosphatase: 69 U/L (ref 40–150)
Anion gap: 7 (ref 3–11)
BUN: 22 mg/dL (ref 7–26)
CO2: 24 mmol/L (ref 22–29)
CREATININE: 0.87 mg/dL (ref 0.70–1.30)
Calcium: 8.8 mg/dL (ref 8.4–10.4)
Chloride: 105 mmol/L (ref 98–109)
GFR calc Af Amer: >60 mL/min (ref >60)
Glucose, Bld: 185 mg/dL — ABNORMAL HIGH (ref 70–140)
POTASSIUM: 4.4 mmol/L (ref 3.5–5.1)
SODIUM: 136 mmol/L (ref 136–145)
Total Bilirubin: 0.8 mg/dL (ref 0.2–1.2)
Total Protein: 6.1 g/dL — ABNORMAL LOW (ref 6.4–8.3)

## 2017-03-06 ENCOUNTER — Other Ambulatory Visit: Payer: Medicare Other

## 2017-03-06 ENCOUNTER — Inpatient Hospital Stay (HOSPITAL_BASED_OUTPATIENT_CLINIC_OR_DEPARTMENT_OTHER): Payer: Medicare Other | Admitting: Medical

## 2017-03-06 ENCOUNTER — Telehealth: Payer: Self-pay | Admitting: Internal Medicine

## 2017-03-06 ENCOUNTER — Inpatient Hospital Stay: Payer: Medicare Other

## 2017-03-06 ENCOUNTER — Other Ambulatory Visit: Payer: Self-pay | Admitting: *Deleted

## 2017-03-06 ENCOUNTER — Other Ambulatory Visit: Payer: Self-pay | Admitting: Medical Oncology

## 2017-03-06 ENCOUNTER — Telehealth: Payer: Self-pay | Admitting: Medical Oncology

## 2017-03-06 ENCOUNTER — Ambulatory Visit (HOSPITAL_COMMUNITY)
Admission: RE | Admit: 2017-03-06 | Discharge: 2017-03-06 | Disposition: A | Discharge status: Home / Self Care | Payer: Medicare Other | Source: Ambulatory Visit | Attending: Medical | Admitting: Medical

## 2017-03-06 VITALS — BP 98/49 | HR 55 | Temp 97.5°F | Resp 18 | Ht 71.0 in | Wt 169.5 lb

## 2017-03-06 DIAGNOSIS — Z5111 Encounter for antineoplastic chemotherapy: Secondary | ICD-10-CM

## 2017-03-06 DIAGNOSIS — Z923 Personal history of irradiation: Secondary | ICD-10-CM | POA: Diagnosis not present

## 2017-03-06 DIAGNOSIS — R5383 Other fatigue: Secondary | ICD-10-CM | POA: Diagnosis not present

## 2017-03-06 DIAGNOSIS — R531 Weakness: Secondary | ICD-10-CM | POA: Diagnosis not present

## 2017-03-06 DIAGNOSIS — C3492 Malignant neoplasm of unspecified part of left bronchus or lung: Secondary | ICD-10-CM

## 2017-03-06 DIAGNOSIS — D6481 Anemia due to antineoplastic chemotherapy: Secondary | ICD-10-CM

## 2017-03-06 DIAGNOSIS — Z5112 Encounter for antineoplastic immunotherapy: Secondary | ICD-10-CM | POA: Diagnosis not present

## 2017-03-06 DIAGNOSIS — J90 Pleural effusion, not elsewhere classified: Secondary | ICD-10-CM | POA: Insufficient documentation

## 2017-03-06 DIAGNOSIS — R509 Fever, unspecified: Secondary | ICD-10-CM

## 2017-03-06 DIAGNOSIS — C3432 Malignant neoplasm of lower lobe, left bronchus or lung: Secondary | ICD-10-CM | POA: Diagnosis not present

## 2017-03-06 DIAGNOSIS — C7931 Secondary malignant neoplasm of brain: Secondary | ICD-10-CM | POA: Diagnosis not present

## 2017-03-06 LAB — CBC WITH DIFFERENTIAL (CANCER CENTER ONLY)
Basophils Absolute: 0 10*3/uL (ref 0.0–0.1)
Basophils Relative: 0 %
EOS ABS: 0.3 10*3/uL (ref 0.0–0.5)
EOS PCT: 3 %
HCT: 25.6 % — ABNORMAL LOW (ref 38.4–49.9)
Hemoglobin: 8.4 g/dL — ABNORMAL LOW (ref 13.0–17.1)
LYMPHS ABS: 0.6 10*3/uL — AB (ref 0.9–3.3)
Lymphocytes Relative: 8 %
MCH: 34.4 pg — AB (ref 27.2–33.4)
MCHC: 32.8 g/dL (ref 32.0–36.0)
MCV: 104.9 fL — ABNORMAL HIGH (ref 79.3–98.0)
MONO ABS: 0.6 10*3/uL (ref 0.1–0.9)
MONOS PCT: 7 %
Neutro Abs: 6.2 10*3/uL (ref 1.5–6.5)
Neutrophils Relative %: 82 %
PLATELETS: 40 10*3/uL — AB (ref 140–400)
RBC: 2.44 MIL/uL — AB (ref 4.20–5.82)
RDW: 22.3 % — AB (ref 11.0–15.6)
WBC: 7.7 10*3/uL (ref 4.0–10.3)

## 2017-03-06 LAB — SAMPLE TO BLOOD BANK

## 2017-03-06 LAB — CMP (CANCER CENTER ONLY)
ALT: 14 U/L (ref 0–55)
ANION GAP: 8 (ref 3–11)
AST: 17 U/L (ref 5–34)
Albumin: 3.1 g/dL — ABNORMAL LOW (ref 3.5–5.0)
Alkaline Phosphatase: 74 U/L (ref 40–150)
BUN: 20 mg/dL (ref 7–26)
CHLORIDE: 107 mmol/L (ref 98–109)
CO2: 24 mmol/L (ref 22–29)
Calcium: 8.8 mg/dL (ref 8.4–10.4)
Creatinine: 0.88 mg/dL (ref 0.70–1.30)
GFR, Estimated: >60 mL/min (ref >60)
Glucose, Bld: 218 mg/dL — ABNORMAL HIGH (ref 70–140)
POTASSIUM: 4.5 mmol/L (ref 3.5–5.1)
SODIUM: 139 mmol/L (ref 136–145)
Total Bilirubin: 0.5 mg/dL (ref 0.2–1.2)
Total Protein: 6.1 g/dL — ABNORMAL LOW (ref 6.4–8.3)

## 2017-03-06 NOTE — Telephone Encounter (Signed)
Wife requests appt today for pt -SOB, thinks fluid is in lungs. Schedule request sent.

## 2017-03-06 NOTE — Telephone Encounter (Signed)
Scheduled patient for lab + smc this afternoon - told patient to go to chest xray beforehand and patient verbalized understanding. Per 1/28 sch msg

## 2017-03-06 NOTE — Telephone Encounter (Signed)
Patient wife called today reporting patient had fluid in his lungs a while back and she thinks he may still have some but she doesn't know how much. Per wife patient also has a bad cough and she wants to know if he can see someone in the office today. Message routed to MM/desk nurse.

## 2017-03-06 NOTE — Telephone Encounter (Signed)
First available with [facility]

## 2017-03-07 ENCOUNTER — Other Ambulatory Visit: Payer: Medicare Other

## 2017-03-07 ENCOUNTER — Telehealth: Payer: Self-pay | Admitting: Medical

## 2017-03-07 NOTE — Progress Notes (Signed)
[facility] Progress Note   Steve Frank [id number] February 27, 1931 84 y.o.  Steve Frank is managed by Dr. Eilleen Frank  Actively treated with chemotherapy: yes  Current Therapy: Carboplatin, paclitaxel, and Keytruda with Neulasta support  Last Treated:  02/20/2017 (cycle 4, day 1)  Assessment: Plan:    Stage IV squamous cell carcinoma of left lung ([place]) - Plan: US Thoracentesis Asp Pleural space w/IMG guide  Pleural effusion - Plan: US Thoracentesis Asp Pleural space w/IMG guide   Stage IV squamous cell carcinoma of the left lung: Patient is status post cycle 4, day 1 of carboplatin, paclitaxel, and Keytruda with Neulasta support last dosed on 02/20/2017.  The patient has a follow-up appointment with Dr. Julien Frank on 03/14/2017.  Left pleural effusion: The patient has been referred to ultrasound for a thoracentesis which has been requested for 03/08/2017.  We can arrange to have the patient received a transfusion of platelets prior to his thoracentesis if needed.  Please see After Visit Summary for patient specific instructions.  Future Appointments  Date Time Provider [place]  03/14/2017 10:45 AM [facility] LAB 3 [facility] None  03/14/2017 11:00 AM Curt Bears, MD [facility] None  03/14/2017 12:00 PM [facility] A1 [facility] None    Orders Placed This Encounter  Procedures  . US Thoracentesis Asp Pleural space w/IMG guide       Subjective:   Patient ID:  Steve Frank is a 84 y.o. (DOB 1931/09/12) male.  Chief Complaint:  Chief Complaint  Patient presents with  . Shortness of Breath    HPI Steve Frank is an 84 year old male with a diagnosis of a stage IV (T1b, and 3, M1 C) non-small cell lung cancer, squamous cell carcinoma which was diagnosed in September 2018 when the patient presented with a left infrahilar mass in addition to mediastinal and supraclavicular lymphadenopathy in addition to metastatic brain lesions.  [name] has been treated with palliative radiation to the brain and is currently receiving carboplatin, paclitaxel, and Keytruda every 3 weeks.  He was first dozed with this regimen on 11/21/2016 and was most recently treated with cycle 4 on 02/20/2017.  He has a history of a left pleural effusion which has been drained with a ultrasound guided thoracentesis.  He presents to the office today with a report of decreased energy, and ongoing cough with a gurgling and wheezing sound.  He is producing clear sputum.  He reports that he fell backwards on 03/02/2017 when his rolling walker malfunction.  He has a bruise on his left posterior skull.  He had a head CT completed which showed no evidence of an acute bleed or other intracranial trauma.  Medications: I have reviewed the patient's current medications.  Allergies:  Allergies  Allergen Reactions  . Ciprofloxacin Nausea And Vomiting    Past Medical History:  Diagnosis Date  . Arthritis   . BPH (benign prostatic hyperplasia)   . CAD (coronary artery disease)    s/p 2 cabg's  . Cancer (HCC)    Basal and squamous cell  skin cancer  . Cerebellar mass   . Chest mass   . Diabetes ([place])    Type II  . GERD (gastroesophageal reflux disease)   . Head injury with loss of consciousness [facility])    age 56 or 74-   . Heart attack ([place])   . HLD (hyperlipidemia)   . Hypertension   . Macular degeneration   . Pneumonia     x  2 last 1071  . Stage IV squamous cell carcinoma of left lung ([place]) 10/20/2016    Past Surgical History:  Procedure Laterality Date  . CARDIAC CATHETERIZATION    . COLONOSCOPY W/ POLYPECTOMY    . CORONARY ARTERY BYPASS GRAFT     Dr Prescott Gum [facility] 07/2007  . CORONARY ARTERY BYPASS GRAFT     1st one was @ [facility] 26years ago 12/1991  . CRANIECTOMY FOR EXCISION OF BRAIN TUMOR INFRATENTORIAL / POSTERIOR FOSSA  08/05/2016   Dr Rebecka Apley  @ [facility]/Neurosurgery  . FLEXIBLE BRONCHOSCOPY  08/07/2016   Dr Gwenevere Ghazi  . IR FLUORO  GUIDE PORT INSERTION RIGHT  11/18/2016  . IR US GUIDE VASC ACCESS RIGHT  11/18/2016  . LUNG BIOPSY N/A 10/14/2016   Procedure: LUNG BIOPSY;  Surgeon: Grace Isaac, MD;  Location: [facility];  Service: Thoracic;  Laterality: N/A;  . VIDEO BRONCHOSCOPY WITH ENDOBRONCHIAL ULTRASOUND N/A 10/14/2016   Procedure: VIDEO BRONCHOSCOPY WITH ENDOBRONCHIAL ULTRASOUND;  Surgeon: Grace Isaac, MD;  Location: [facility] OR;  Service: Thoracic;  Laterality: N/A;    Family History  Problem Relation Age of Onset  . Cancer Brother        lung to head and neck involving lymph nodes  . Cancer Paternal Aunt        breast  . Cancer Paternal Uncle        leukemia  . Cancer Paternal Aunt        breast  . Cancer Paternal Uncle        lung    Social History   Socioeconomic History  . Marital status: Married    Spouse name: Not on file  . Number of children: 4  . Years of education: Not on file  . Highest education level: Not on file  Social Needs  . Financial resource strain: Not on file  . Food insecurity - worry: Not on file  . Food insecurity - inability: Not on file  . Transportation needs - medical: Not on file  . Transportation needs - non-medical: Not on file  Occupational History  . Occupation: retired  Tobacco Use  . Smoking status: Former Smoker    Packs/day: 2.00    Years: 35.00    Pack years: 70.00    Types: Cigarettes    Last attempt to quit: 02/07/1985    Years since quitting: 32.0  . Smokeless tobacco: Former Systems developer  . Tobacco comment: 10/13/16- Quit 31 years ago  Substance and Sexual Activity  . Alcohol use: No  . Drug use: No  . Sexual activity: Not Currently  Other Topics Concern  . Not on file  Social History Narrative  . Not on file    Past Medical History, Surgical history, Social history, and Family history were reviewed and updated as appropriate.   Please see review of systems for further details on the patient's review from today.   Review of Systems:  Review of  Systems  Constitutional: Positive for activity change and fatigue. Negative for appetite change, chills, diaphoresis and fever.  HENT: Negative for trouble swallowing.   Respiratory: Positive for cough, shortness of breath and wheezing. Negative for choking and chest tightness.   Cardiovascular: Negative for chest pain and palpitations.  Gastrointestinal: Negative for constipation, diarrhea, nausea and vomiting.  Skin: Positive for wound (Left posterior skull with bruising after a fall).    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  Patient is an elderly male who appears to be in no acute distress.  He is ambulating with the use of a wheelchair.  HENT:  Head:    Cardiovascular: Normal rate, regular rhythm and normal heart sounds. Exam reveals no gallop and no friction rub.  No murmur heard. Pulmonary/Chest: Effort normal. No respiratory distress. He has no wheezes. He has no rales.      Neurological: He is alert. Coordination (Patient is ambulating with the use of a wheelchair.) abnormal.  Skin: Skin is warm and dry. He is not diaphoretic.    Lab Review:     Component Value Date/Time   NA 139 03/06/2017 1423   NA 138 02/08/2017 1118   K 4.5 03/06/2017 1423   K 4.5 02/08/2017 1118   CL 107 03/06/2017 1423   CO2 24 03/06/2017 1423   CO2 24 02/08/2017 1118   GLUCOSE 218 (H) 03/06/2017 1423   GLUCOSE 151 (H) 02/08/2017 1118   BUN 20 03/06/2017 1423   BUN 16.4 02/08/2017 1118   CREATININE 0.87 03-21-2017 1120   CREATININE 0.9 02/08/2017 1118   CALCIUM 8.8 03/06/2017 1423   CALCIUM 9.0 02/08/2017 1118   PROT 6.1 (L) 03/06/2017 1423   PROT 6.1 (L) 02/08/2017 1118   ALBUMIN  3.1 (L) 03/06/2017 1423   ALBUMIN 3.1 (L) 02/08/2017 1118   AST 17 03/06/2017 1423   AST 19 02/08/2017 1118   ALT 14 03/06/2017 1423   ALT 15 02/08/2017 1118   ALKPHOS 74 03/06/2017 1423   ALKPHOS 72 02/08/2017 1118   BILITOT 0.5 03/06/2017 1423   BILITOT 0.53 02/08/2017 1118   GFRNONAA >60 03/06/2017 1423   GFRAA >60 03/06/2017 1423       Component Value Date/Time   WBC 7.7 03/06/2017 1423   WBC 2.4 (L) 03-21-2017 1120   RBC 2.44 (L) 03/06/2017 1423   HGB 8.1 (L) 03-21-2017 1120   HGB 9.0 (L) 02/08/2017 1118   HCT 25.6 (L) 03/06/2017 1423   HCT 27.2 (L) 02/08/2017 1118   PLT 40 (L) 03/06/2017 1423   PLT 41 (L) 02/08/2017 1118   MCV 104.9 (H) 03/06/2017 1423   MCV 97.5 02/08/2017 1118   MCH 34.4 (H) 03/06/2017 1423   MCHC 32.8 03/06/2017 1423   RDW 22.3 (H) 03/06/2017 1423   RDW 21.9 (H) 02/08/2017 1118   LYMPHSABS 0.6 (L) 03/06/2017 1423   LYMPHSABS 0.6 (L) 02/08/2017 1118   MONOABS 0.6 03/06/2017 1423   MONOABS 0.7 02/08/2017 1118   EOSABS 0.3 03/06/2017 1423   EOSABS 0.2 02/08/2017 1118   BASOSABS 0.0 03/06/2017 1423   BASOSABS 0.0 02/08/2017 1118   -------------------------------  Imaging from last 24 hours (if applicable):  Radiology interpretation: Dg Chest 1 View  Result Date: 03/06/2017 CLINICAL DATA:  Pt hx left lung ca, hx antineoplastic chemo, pt c/o weakness, sob, unable to stand. EXAM: CHEST 1 VIEW COMPARISON:  02/08/2017 chest radiograph, chest CT 02/17/2017 FINDINGS: The port in the RIGHT chest wall. Persistent small moderate LEFT pleural effusion. Upper lungs are clear. No pneumothorax. IMPRESSION: Persistent moderate LEFT pleural effusion. Electronically Signed   By: Suzy Bouchard M.D.   On:  03/06/2017 14:18   Dg Chest 1 View  Result Date: 02/08/2017 CLINICAL DATA:  Stage IV squamous cell carcinoma of the left lung. EXAM: CHEST 1 VIEW COMPARISON:  12/22/2016 FINDINGS: Status post CABG. Heart size is within normal limits. There is moderate aortic  atherosclerosis at the arch. Port catheter tip terminates in the distal SVC. Slight obscuration of the left hemidiaphragm and blunting of the left lower costophrenic angle consistent with a small left effusion appears minimally more prominent. No pneumothorax or pneumonic consolidations. Mild hyperinflation of the right lung likely accounting for slight blunting of the right lateral costophrenic angle. No aggressive lytic or blastic disease identified. IMPRESSION: 1. Negligible increase in small left pleural effusion with adjacent atelectasis. 2. Status post CABG with aortic atherosclerosis. Electronically Signed   By: Ashley Royalty M.D.   On: 02/08/2017 13:35   Ct Chest W Contrast  Result Date: 02/18/2017 CLINICAL DATA:  Restaging lung cancer. Patient has known brain metastasis. EXAM: CT CHEST, ABDOMEN, AND PELVIS WITH CONTRAST TECHNIQUE: Multidetector CT imaging of the chest, abdomen and pelvis was performed following the standard protocol during bolus administration of intravenous contrast. CONTRAST:  151m ISOVUE-300 IOPAMIDOL (ISOVUE-300) INJECTION 61% COMPARISON:  CT scan 08/04/2016 and PET-CT 09/20/2016 FINDINGS: CT CHEST FINDINGS Cardiovascular: The heart is normal in size. No pericardial effusion. Stable tortuosity and dense calcification of the thoracic aorta and branch vessels including three-vessel coronary artery calcifications. Stable surgical changes from bypass surgery. Mediastinum/Nodes: Interval regression of mediastinal and hilar lymphadenopathy. 14 mm precarinal lymph node now measures 7 mm. Large left hilar nodal mass previously measured 5.6 x 3.4 cm and now measures a maximum of 15 mm. The 13.5 cm right-sided subcarinal adenopathy previously measured 17 mm. Lungs/Pleura: Stable emphysematous changes. The left lower lobe lung mass is not identified. There are changes of radiation fibrosis involving the left lower lobe. There is a moderate left-sided pleural effusion with overlying atelectasis.  Small right pleural effusion with overlying atelectasis. No obvious pleural nodules. Musculoskeletal: No chest wall mass, supraclavicular or axillary lymphadenopathy. Small thyroid gland nodules are stable. Right Port-A-Cath is in good position. New sclerotic changes at T5 and T6 with a linear band of gas in the T6 vertebral body. Findings could be due to avascular necrosis/Kummel's disease. Metastatic disease is also possible. MRI of the thoracic spine without and with contrast may be helpful for further evaluation. I do not see any other obvious bone lesions. CT ABDOMEN PELVIS FINDINGS Hepatobiliary: No focal hepatic lesions to suggest metastatic disease. The gallbladder is normal. No common bile duct dilatation. Pancreas: No mass, inflammation or ductal dilatation. Spleen: Normal size.  No focal lesions. Adrenals/Urinary Tract: The adrenal glands and kidneys are unremarkable. Stable renal scarring changes and renal cortical thinning. No worrisome bladder lesions. Stomach/Bowel: The stomach, duodenum, small bowel and colon are grossly normal. No acute inflammatory changes, mass lesions or obstructive findings. Moderate stool noted throughout the colon. Vascular/Lymphatic: Advanced atherosclerotic calcifications involving the aorta and branch vessels. No aneurysm or dissection. The major venous structures are patent. No mesenteric or retroperitoneal mass or adenopathy. Small scattered lymph nodes are stable. Reproductive: The prostate gland and seminal vesicles are unremarkable. Other: No pelvic mass or adenopathy. No free pelvic fluid collections. No inguinal mass or adenopathy. No abdominal wall hernia or subcutaneous lesions. Musculoskeletal: Advanced degenerative changes involving the spine and osteoporosis but no obvious bone lesions. IMPRESSION: 1. Significant interval regression of left lower lobe lung lesion and left hilar/infrahilar mass. 2. Regression of mediastinal lymphadenopathy. 3. Radiation  changes  involving the left lower lobe. 4. Moderate-sized left effusion and small right effusion with overlying atelectasis. 5. No findings for abdominal/pelvic metastatic disease. 6. New sclerotic changes at T5 and T6. Findings could be due to avascular necrosis/kummel's disease. Metastatic disease is also possible. MRI thoracic spine without with contrast may be helpful for further evaluation. Electronically Signed   By: Marijo Sanes M.D.   On: 02/18/2017 19:44   Mr Jeri Cos VO Contrast  Result Date: 02/16/2017 CLINICAL DATA:  S RS restaging.  Left cerebellar mass. EXAM: MRI HEAD WITHOUT AND WITH CONTRAST TECHNIQUE: Multiplanar, multiecho pulse sequences of the brain and surrounding structures were obtained without and with intravenous contrast. CONTRAST:  50m MULTIHANCE GADOBENATE DIMEGLUMINE 529 MG/ML IV SOLN COMPARISON:  11/02/2016.  08/06/2016. FINDINGS: Brain: No foci of restricted diffusion. Cerebral hemispheres again show moderate chronic small-vessel ischemic changes. No large vessel territory insult. In the posterior fossa, the patient has had left-sided craniotomy and partial resection of the left cerebellum. There has been successful interval treatment of the large left cerebellar lesion. There is atrophy and gliosis in the left cerebellar hemisphere. Large tumor mass is completely gone. There are 2 punctate foci of enhancement on the left at the middle cerebellar peduncle and medial left cerebellar hemisphere. No mass effect. No sign of vasogenic edema. Residual hemosiderin related to previous treatment. No new lesion elsewhere. Vascular: Major vessels at the base of the brain show flow. Skull and upper cervical spine: Otherwise negative Sinuses/Orbits: Clear/normal Other: None IMPRESSION: Excellent post treatment appearance. Large left cerebellar tumor mass completely inapparent presently. Atrophy and gliosis of the left cerebellar hemisphere. Two punctate foci of enhancement, 1 in the left middle  cerebellar peduncle in the other in the medial inferior cerebellar hemisphere which may relate to previous treatment and can be followed on subsequent examinations. Electronically Signed   By: MNelson ChimesM.D.   On: 02/16/2017 13:09   Ct Abdomen Pelvis W Contrast  Result Date: 02/18/2017 CLINICAL DATA:  Restaging lung cancer. Patient has known brain metastasis. EXAM: CT CHEST, ABDOMEN, AND PELVIS WITH CONTRAST TECHNIQUE: Multidetector CT imaging of the chest, abdomen and pelvis was performed following the standard protocol during bolus administration of intravenous contrast. CONTRAST:  1046mISOVUE-300 IOPAMIDOL (ISOVUE-300) INJECTION 61% COMPARISON:  CT scan 08/04/2016 and PET-CT 09/20/2016 FINDINGS: CT CHEST FINDINGS Cardiovascular: The heart is normal in size. No pericardial effusion. Stable tortuosity and dense calcification of the thoracic aorta and branch vessels including three-vessel coronary artery calcifications. Stable surgical changes from bypass surgery. Mediastinum/Nodes: Interval regression of mediastinal and hilar lymphadenopathy. 14 mm precarinal lymph node now measures 7 mm. Large left hilar nodal mass previously measured 5.6 x 3.4 cm and now measures a maximum of 15 mm. The 13.5 cm right-sided subcarinal adenopathy previously measured 17 mm. Lungs/Pleura: Stable emphysematous changes. The left lower lobe lung mass is not identified. There are changes of radiation fibrosis involving the left lower lobe. There is a moderate left-sided pleural effusion with overlying atelectasis. Small right pleural effusion with overlying atelectasis. No obvious pleural nodules. Musculoskeletal: No chest wall mass, supraclavicular or axillary lymphadenopathy. Small thyroid gland nodules are stable. Right Port-A-Cath is in good position. New sclerotic changes at T5 and T6 with a linear band of gas in the T6 vertebral body. Findings could be due to avascular necrosis/Kummel's disease. Metastatic disease is also  possible. MRI of the thoracic spine without and with contrast may be helpful for further evaluation. I do not see any other obvious  bone lesions. CT ABDOMEN PELVIS FINDINGS Hepatobiliary: No focal hepatic lesions to suggest metastatic disease. The gallbladder is normal. No common bile duct dilatation. Pancreas: No mass, inflammation or ductal dilatation. Spleen: Normal size.  No focal lesions. Adrenals/Urinary Tract: The adrenal glands and kidneys are unremarkable. Stable renal scarring changes and renal cortical thinning. No worrisome bladder lesions. Stomach/Bowel: The stomach, duodenum, small bowel and colon are grossly normal. No acute inflammatory changes, mass lesions or obstructive findings. Moderate stool noted throughout the colon. Vascular/Lymphatic: Advanced atherosclerotic calcifications involving the aorta and branch vessels. No aneurysm or dissection. The major venous structures are patent. No mesenteric or retroperitoneal mass or adenopathy. Small scattered lymph nodes are stable. Reproductive: The prostate gland and seminal vesicles are unremarkable. Other: No pelvic mass or adenopathy. No free pelvic fluid collections. No inguinal mass or adenopathy. No abdominal wall hernia or subcutaneous lesions. Musculoskeletal: Advanced degenerative changes involving the spine and osteoporosis but no obvious bone lesions. IMPRESSION: 1. Significant interval regression of left lower lobe lung lesion and left hilar/infrahilar mass. 2. Regression of mediastinal lymphadenopathy. 3. Radiation changes involving the left lower lobe. 4. Moderate-sized left effusion and small right effusion with overlying atelectasis. 5. No findings for abdominal/pelvic metastatic disease. 6. New sclerotic changes at T5 and T6. Findings could be due to avascular necrosis/kummel's disease. Metastatic disease is also possible. MRI thoracic spine without with contrast may be helpful for further evaluation. Electronically Signed   By: Marijo Sanes M.D.   On: 02/18/2017 19:44        This case was discussed with Dr. Julien Frank. He expressed agreement with my management of this patient.

## 2017-03-07 NOTE — Telephone Encounter (Signed)
No 1/28 los

## 2017-03-08 ENCOUNTER — Other Ambulatory Visit: Payer: Self-pay | Admitting: Medical

## 2017-03-08 DIAGNOSIS — J910 Malignant pleural effusion: Secondary | ICD-10-CM

## 2017-03-09 ENCOUNTER — Ambulatory Visit (HOSPITAL_COMMUNITY)
Admission: RE | Admit: 2017-03-09 | Discharge: 2017-03-09 | Disposition: A | Discharge status: Home / Self Care | Payer: Medicare Other | Source: Ambulatory Visit | Attending: Student | Admitting: Student

## 2017-03-09 ENCOUNTER — Other Ambulatory Visit (HOSPITAL_COMMUNITY): Payer: Self-pay | Admitting: Student

## 2017-03-09 ENCOUNTER — Ambulatory Visit (HOSPITAL_COMMUNITY)
Admission: RE | Admit: 2017-03-09 | Discharge: 2017-03-09 | Disposition: A | Discharge status: Home / Self Care | Payer: Medicare Other | Source: Ambulatory Visit | Attending: Medical | Admitting: Medical

## 2017-03-09 ENCOUNTER — Encounter (HOSPITAL_COMMUNITY): Payer: Self-pay | Admitting: Student

## 2017-03-09 DIAGNOSIS — J910 Malignant pleural effusion: Secondary | ICD-10-CM | POA: Diagnosis present

## 2017-03-09 DIAGNOSIS — J90 Pleural effusion, not elsewhere classified: Secondary | ICD-10-CM

## 2017-03-09 HISTORY — PX: IR THORACENTESIS RIGHT ASP PLEURAL SPACE W/IMG GUIDE: IMG5380

## 2017-03-09 MED ORDER — LIDOCAINE 2% (20 MG/ML) 5 ML SYRINGE
INTRAMUSCULAR | Status: AC
  Filled 2017-03-09: qty 10

## 2017-03-09 NOTE — Procedures (Signed)
PROCEDURE SUMMARY:  Successful US guided left therapeutic thoracentesis. Yielded 900 mL of amber fluid. Pt tolerated procedure well. No immediate complications.  Specimen was not sent for labs. CXR ordered.  Docia Barrier PA-C 03/09/2017 1:40 PM

## 2017-03-14 ENCOUNTER — Inpatient Hospital Stay: Payer: Medicare Other

## 2017-03-14 ENCOUNTER — Inpatient Hospital Stay: Payer: Medicare Other | Attending: Internal Medicine | Admitting: Internal Medicine

## 2017-03-14 ENCOUNTER — Encounter: Payer: Self-pay | Admitting: Internal Medicine

## 2017-03-14 ENCOUNTER — Telehealth: Payer: Self-pay | Admitting: Internal Medicine

## 2017-03-14 VITALS — BP 96/57 | HR 50 | Temp 97.4°F | Resp 17 | Ht 71.0 in | Wt 164.7 lb

## 2017-03-14 DIAGNOSIS — C3492 Malignant neoplasm of unspecified part of left bronchus or lung: Secondary | ICD-10-CM

## 2017-03-14 DIAGNOSIS — C3432 Malignant neoplasm of lower lobe, left bronchus or lung: Secondary | ICD-10-CM | POA: Diagnosis not present

## 2017-03-14 DIAGNOSIS — Z5112 Encounter for antineoplastic immunotherapy: Secondary | ICD-10-CM | POA: Diagnosis present

## 2017-03-14 DIAGNOSIS — C7931 Secondary malignant neoplasm of brain: Secondary | ICD-10-CM

## 2017-03-14 DIAGNOSIS — E119 Type 2 diabetes mellitus without complications: Secondary | ICD-10-CM | POA: Diagnosis not present

## 2017-03-14 DIAGNOSIS — Z923 Personal history of irradiation: Secondary | ICD-10-CM

## 2017-03-14 DIAGNOSIS — Z79899 Other long term (current) drug therapy: Secondary | ICD-10-CM | POA: Insufficient documentation

## 2017-03-14 DIAGNOSIS — I2510 Atherosclerotic heart disease of native coronary artery without angina pectoris: Secondary | ICD-10-CM | POA: Diagnosis not present

## 2017-03-14 DIAGNOSIS — J90 Pleural effusion, not elsewhere classified: Secondary | ICD-10-CM | POA: Insufficient documentation

## 2017-03-14 DIAGNOSIS — D6481 Anemia due to antineoplastic chemotherapy: Secondary | ICD-10-CM | POA: Diagnosis not present

## 2017-03-14 LAB — CBC WITH DIFFERENTIAL/PLATELET
Basophils Absolute: 0 10*3/uL (ref 0.0–0.1)
Basophils Relative: 1 %
EOS ABS: 0.3 10*3/uL (ref 0.0–0.5)
Eosinophils Relative: 7 %
HCT: 26.5 % — ABNORMAL LOW (ref 38.4–49.9)
HEMOGLOBIN: 8.8 g/dL — AB (ref 13.0–17.1)
Lymphocytes Relative: 7 %
Lymphs Abs: 0.3 10*3/uL — ABNORMAL LOW (ref 0.9–3.3)
MCH: 35.9 pg — AB (ref 27.2–33.4)
MCHC: 33.1 g/dL (ref 32.0–36.0)
MCV: 108.5 fL — AB (ref 79.3–98.0)
MONOS PCT: 11 %
Monocytes Absolute: 0.6 10*3/uL (ref 0.1–0.9)
NEUTROS PCT: 74 %
Neutro Abs: 3.7 10*3/uL (ref 1.5–6.5)
Platelets: 81 10*3/uL — ABNORMAL LOW (ref 140–400)
RBC: 2.44 MIL/uL — ABNORMAL LOW (ref 4.20–5.82)
RDW: 22.7 % — ABNORMAL HIGH (ref 11.0–14.6)
WBC: 4.9 10*3/uL (ref 4.0–10.3)

## 2017-03-14 LAB — COMPREHENSIVE METABOLIC PANEL
ALT: 14 U/L (ref 0–55)
AST: 18 U/L (ref 5–34)
Albumin: 3 g/dL — ABNORMAL LOW (ref 3.5–5.0)
Alkaline Phosphatase: 68 U/L (ref 40–150)
Anion gap: 8 (ref 3–11)
BUN: 22 mg/dL (ref 7–26)
CHLORIDE: 106 mmol/L (ref 98–109)
CO2: 24 mmol/L (ref 22–29)
CREATININE: 0.86 mg/dL (ref 0.70–1.30)
Calcium: 9.2 mg/dL (ref 8.4–10.4)
GFR calc Af Amer: >60 mL/min (ref >60)
GFR calc non Af Amer: >60 mL/min (ref >60)
Glucose, Bld: 184 mg/dL — ABNORMAL HIGH (ref 70–140)
Potassium: 4.6 mmol/L (ref 3.5–5.1)
SODIUM: 138 mmol/L (ref 136–145)
Total Bilirubin: 0.4 mg/dL (ref 0.2–1.2)
Total Protein: 6.2 g/dL — ABNORMAL LOW (ref 6.4–8.3)

## 2017-03-14 MED ORDER — HEPARIN SOD (PORK) LOCK FLUSH 100 UNIT/ML IV SOLN
500.0000 [IU] | Freq: Once | INTRAVENOUS | Status: AC | PRN
  Administered 2017-03-14: 500 [IU]
  Filled 2017-03-14: qty 5

## 2017-03-14 MED ORDER — SODIUM CHLORIDE 0.9 % IV SOLN
200.0000 mg | Freq: Once | INTRAVENOUS | Status: AC
  Administered 2017-03-14: 200 mg via INTRAVENOUS
  Filled 2017-03-14: qty 8

## 2017-03-14 MED ORDER — SODIUM CHLORIDE 0.9% FLUSH
10.0000 mL | INTRAVENOUS | Status: DC | PRN
  Administered 2017-03-14: 10 mL
  Filled 2017-03-14: qty 10

## 2017-03-14 MED ORDER — SODIUM CHLORIDE 0.9 % IV SOLN
Freq: Once | INTRAVENOUS | Status: AC
  Administered 2017-03-14: 13:00:00 via INTRAVENOUS

## 2017-03-14 NOTE — Patient Instructions (Signed)
[facility] Discharge Instructions for Patients Receiving Chemotherapy  Today you received the following chemotherapy agents:  pembrolizumab [name]).  To help prevent nausea and vomiting after your treatment, we encourage you to take your nausea medication as directed.   If you develop nausea and vomiting that is not controlled by your nausea medication, call the clinic.   BELOW ARE SYMPTOMS THAT SHOULD BE REPORTED IMMEDIATELY:  *FEVER GREATER THAN 100.5 F  *CHILLS WITH OR WITHOUT FEVER  NAUSEA AND VOMITING THAT IS NOT CONTROLLED WITH YOUR NAUSEA MEDICATION  *UNUSUAL SHORTNESS OF BREATH  *UNUSUAL BRUISING OR BLEEDING  TENDERNESS IN MOUTH AND THROAT WITH OR WITHOUT PRESENCE OF ULCERS  *URINARY PROBLEMS  *BOWEL PROBLEMS  UNUSUAL RASH Items with * indicate a potential emergency and should be followed up as soon as possible.  Feel free to call the clinic should you have any questions or concerns. The clinic phone number is [phone].  Please show the [facility] at check-in to the Emergency Department and triage nurse.

## 2017-03-14 NOTE — Progress Notes (Signed)
[facility] [phone]   [phone]  OFFICE PROGRESS NOTE  [name], MD [address] [place] [address]  DIAGNOSIS: Stage IV (T1b, N3, M1c) non-small cell lung cancer, squamous cell carcinoma diagnosed in September 2018 presented with left infrahilar mass in addition to mediastinal and supraclavicular lymphadenopathy as well as metastatic brain lesion.  PDL1 expression 0%.  PRIOR THERAPY:  1) Palliative radiotherapy to the brain metastasis. 2)  Systemic chemotherapy with carboplatin for AUC of 5, paclitaxel 175 MG/M2 and Ketruda (pembrolizumab) 200 MG IV every 3 weeks. First dose 11/21/2016.  Status post 4 cycles.  CURRENT THERAPY: Maintenance treatment with single agent Keytruda 200 mg IV every 3 weeks.  First cycle March 14, 2017.  INTERVAL HISTORY: Steve Frank 84 y.o. male returns to the clinic today for follow-up visit accompanied by his wife.  The patient tolerated the last cycle of his systemic chemotherapy with carboplatin, paclitaxel and Keytruda fairly well.  He denied having any chest pain but continues to have shortness of breath with exertion.  He has no cough or hemoptysis.  He underwent ultrasound-guided left thoracentesis but no significant improvement in his breathing condition.  He denied having any nausea, vomiting, diarrhea or constipation.  He denied having any fever or chills.  He is here today for evaluation before starting the first cycle of his maintenance treatment with single agent Keytruda.  MEDICAL HISTORY: Past Medical History:  Diagnosis Date  . Arthritis   . BPH (benign prostatic hyperplasia)   . CAD (coronary artery disease)    s/p 2 cabg's  . Cancer (HCC)    Basal and squamous cell  skin cancer  . Cerebellar mass   . Chest mass   . Diabetes ([facility])    Type II  . GERD (gastroesophageal reflux disease)   . Head injury with loss of consciousness [facility])    age 39 or 37-   . Heart attack ([place])     . HLD (hyperlipidemia)   . Hypertension   . Macular degeneration   . Pneumonia     x 2 last 1071  . Stage IV squamous cell carcinoma of left lung ([facility]) 10/20/2016    ALLERGIES:  is allergic to ciprofloxacin.  MEDICATIONS:  Current Outpatient Medications  Medication Sig Dispense Refill  . acetaminophen (TYLENOL) 325 MG tablet Take 650 mg by mouth every 6 (six) hours as needed (for pain/headaches.).     Marland Kitchen albuterol (PROVENTIL HFA;VENTOLIN HFA) 108 (90 Base) MCG/ACT inhaler Inhale 2 puffs into the lungs every 6 (six) hours as needed (for wheezing/shortness of breath).     . B-D INS SYR ULTRAFINE 1CC/31G 31G X 5/16" 1 ML MISC     . carvedilol (COREG) 25 MG tablet Take 25 mg by mouth 2 (two) times daily with a meal.     . Cholecalciferol (VITAMIN D-1000 MAX ST) 1000 units tablet Take 1,000 Units by mouth daily.     . clotrimazole-betamethasone (LOTRISONE) cream Apply 1 application topically 2 (two) times daily.    Marland Kitchen docusate sodium (COLACE) 100 MG capsule Take 100 mg by mouth 2 (two) times daily as needed (for constipation.).    Marland Kitchen gabapentin (NEURONTIN) 400 MG capsule Take 400 mg by mouth at bedtime.    Marland Kitchen guaiFENesin-dextromethorphan (ROBITUSSIN DM) 100-10 MG/5ML syrup Take 15 mLs every 4 (four) hours as needed by mouth for cough.    Marland Kitchen HUMALOG 100 UNIT/ML injection INJECT 2 TO 0.12MLS (2-8 UNITS) UNDER THE  SKIN 3 TIMES A DAY BEFORE MEALS, AS PER SLIDING SCALE  0  . LANTUS 100 UNIT/ML injection INJECT 0.15ML (15 UNITS TOTAL) UNDER THE SKIN DAILY AT BEDTIME.  0  . lidocaine-prilocaine (EMLA) cream Apply 1 application topically as needed. 30 g 0  . mirtazapine (REMERON) 15 MG tablet Take 15 mg by mouth at bedtime.  0  . Multiple Vitamin (MULTIVITAMIN WITH MINERALS) TABS tablet Take 1 tablet by mouth daily.    Marland Kitchen neomycin-bacitracin-polymyxin (NEOSPORIN) [phone] ointment Apply 1 application topically at bedtime.    Marland Kitchen omeprazole (PRILOSEC) 20 MG capsule Take 20 mg by mouth at bedtime.     .  polyethylene glycol (MIRALAX / GLYCOLAX) packet Take 17 g by mouth daily as needed (for constipation.).     Marland Kitchen rosuvastatin (CRESTOR) 20 MG tablet Take 20 mg by mouth daily.    . magnesium hydroxide (MILK OF MAGNESIA) 400 MG/5ML suspension Take 30 mLs by mouth daily as needed (for constipation.).    Marland Kitchen prochlorperazine (COMPAZINE) 10 MG tablet Take 1 tablet (10 mg total) by mouth every 6 (six) hours as needed for nausea or vomiting. (Patient not taking: Reported on 01/24/2017) 30 tablet 0  . triamcinolone (KENALOG) 0.025 % ointment Apply 1 application 2 (two) times daily topically. (Patient not taking: Reported on 03/14/2017) 30 g 1   No current facility-administered medications for this visit.     SURGICAL HISTORY:  Past Surgical History:  Procedure Laterality Date  . CARDIAC CATHETERIZATION    . COLONOSCOPY W/ POLYPECTOMY    . CORONARY ARTERY BYPASS GRAFT     Dr Prescott Gum [facility] 07/2007  . CORONARY ARTERY BYPASS GRAFT     1st one was @ [facility] 26years ago 12/1991  . CRANIECTOMY FOR EXCISION OF BRAIN TUMOR INFRATENTORIAL / POSTERIOR FOSSA  08/05/2016   Dr Rebecka Apley  @ [facility]/Neurosurgery  . FLEXIBLE BRONCHOSCOPY  08/07/2016   Dr Gwenevere Ghazi  . IR FLUORO GUIDE PORT INSERTION RIGHT  11/18/2016  . IR THORACENTESIS ASP PLEURAL SPACE W/IMG GUIDE  03/09/2017  . IR US GUIDE VASC ACCESS RIGHT  11/18/2016  . LUNG BIOPSY N/A 10/14/2016   Procedure: LUNG BIOPSY;  Surgeon: Grace Isaac, MD;  Location: [facility];  Service: Thoracic;  Laterality: N/A;  . VIDEO BRONCHOSCOPY WITH ENDOBRONCHIAL ULTRASOUND N/A 10/14/2016   Procedure: VIDEO BRONCHOSCOPY WITH ENDOBRONCHIAL ULTRASOUND;  Surgeon: Grace Isaac, MD;  Location: [facility];  Service: Thoracic;  Laterality: N/A;    REVIEW OF SYSTEMS:  A comprehensive review of systems was negative except for: Constitutional: positive for fatigue Respiratory: positive for dyspnea on exertion   PHYSICAL EXAMINATION: General appearance: alert, cooperative,  fatigued and no distress Head: Normocephalic, without obvious abnormality, atraumatic Neck: no adenopathy, no JVD, supple, symmetrical, trachea midline and thyroid not enlarged, symmetric, no tenderness/mass/nodules Lymph nodes: Cervical, supraclavicular, and axillary nodes normal. Resp: clear to auscultation bilaterally Back: symmetric, no curvature. ROM normal. No CVA tenderness. Cardio: regular rate and rhythm, S1, S2 normal, no murmur, click, rub or gallop GI: soft, non-tender; bowel sounds normal; no masses,  no organomegaly Extremities: extremities normal, atraumatic, no cyanosis or edema  ECOG PERFORMANCE STATUS: 1 - Symptomatic but completely ambulatory  Blood pressure (!) 96/57, pulse (!) 50, temperature (!) 97.4 F (36.3 C), temperature source Oral, resp. rate 17, height _0  (1.803 m), weight 164 lb 11.2 oz (74.7 kg), SpO2 100 %.  LABORATORY DATA: Lab Results  Component Value Date   WBC 4.9 03/14/2017   HGB 8.8 (L)  03/14/2017   HCT 26.5 (L) 03/14/2017   MCV 108.5 (H) 03/14/2017   PLT 81 (L) 03/14/2017      Chemistry      Component Value Date/Time   NA 138 03/14/2017 1031   NA 138 02/08/2017 1118   K 4.6 03/14/2017 1031   K 4.5 02/08/2017 1118   CL 106 03/14/2017 1031   CO2 24 03/14/2017 1031   CO2 24 02/08/2017 1118   BUN 22 03/14/2017 1031   BUN 16.4 02/08/2017 1118   CREATININE 0.86 03/14/2017 1031   CREATININE 0.9 02/08/2017 1118      Component Value Date/Time   CALCIUM 9.2 03/14/2017 1031   CALCIUM 9.0 02/08/2017 1118   ALKPHOS 68 03/14/2017 1031   ALKPHOS 72 02/08/2017 1118   AST 18 03/14/2017 1031   AST 17 03/06/2017 1423   AST 19 02/08/2017 1118   ALT 14 03/14/2017 1031   ALT 14 03/06/2017 1423   ALT 15 02/08/2017 1118   BILITOT 0.4 03/14/2017 1031   BILITOT 0.5 03/06/2017 1423   BILITOT 0.53 02/08/2017 1118       RADIOGRAPHIC STUDIES: Dg Chest 1 View  Result Date: 03/09/2017 CLINICAL DATA:  Followup left thoracentesis. EXAM: CHEST 1  VIEW COMPARISON:  03/06/2017 FINDINGS: Previous median sternotomy and CABG. Power port in place from a right internal jugular approach with the tip in the SVC 1 cm above the right atrium. Less pleural density on the left. No pneumothorax. Better aeration of the left lower lobe. IMPRESSION: Less pleural fluid on the left following thoracentesis. No pneumothorax. Electronically Signed   By: Nelson Chimes M.D.   On: 03/09/2017 13:53   Dg Chest 1 View  Result Date: 03/06/2017 CLINICAL DATA:  Pt hx left lung ca, hx antineoplastic chemo, pt c/o weakness, sob, unable to stand. EXAM: CHEST 1 VIEW COMPARISON:  02/08/2017 chest radiograph, chest CT 02/17/2017 FINDINGS: The port in the RIGHT chest wall. Persistent small moderate LEFT pleural effusion. Upper lungs are clear. No pneumothorax. IMPRESSION: Persistent moderate LEFT pleural effusion. Electronically Signed   By: Suzy Bouchard M.D.   On: 03/06/2017 14:18   Ct Chest W Contrast  Result Date: 02/18/2017 CLINICAL DATA:  Restaging lung cancer. Patient has known brain metastasis. EXAM: CT CHEST, ABDOMEN, AND PELVIS WITH CONTRAST TECHNIQUE: Multidetector CT imaging of the chest, abdomen and pelvis was performed following the standard protocol during bolus administration of intravenous contrast. CONTRAST:  189m ISOVUE-300 IOPAMIDOL (ISOVUE-300) INJECTION 61% COMPARISON:  CT scan 08/04/2016 and PET-CT 09/20/2016 FINDINGS: CT CHEST FINDINGS Cardiovascular: The heart is normal in size. No pericardial effusion. Stable tortuosity and dense calcification of the thoracic aorta and branch vessels including three-vessel coronary artery calcifications. Stable surgical changes from bypass surgery. Mediastinum/Nodes: Interval regression of mediastinal and hilar lymphadenopathy. 14 mm precarinal lymph node now measures 7 mm. Large left hilar nodal mass previously measured 5.6 x 3.4 cm and now measures a maximum of 15 mm. The 13.5 cm right-sided subcarinal adenopathy previously  measured 17 mm. Lungs/Pleura: Stable emphysematous changes. The left lower lobe lung mass is not identified. There are changes of radiation fibrosis involving the left lower lobe. There is a moderate left-sided pleural effusion with overlying atelectasis. Small right pleural effusion with overlying atelectasis. No obvious pleural nodules. Musculoskeletal: No chest wall mass, supraclavicular or axillary lymphadenopathy. Small thyroid gland nodules are stable. Right Port-A-Cath is in good position. New sclerotic changes at T5 and T6 with a linear band of gas in the T6 vertebral body. Findings could  be due to avascular necrosis/Kummel's disease. Metastatic disease is also possible. MRI of the thoracic spine without and with contrast may be helpful for further evaluation. I do not see any other obvious bone lesions. CT ABDOMEN PELVIS FINDINGS Hepatobiliary: No focal hepatic lesions to suggest metastatic disease. The gallbladder is normal. No common bile duct dilatation. Pancreas: No mass, inflammation or ductal dilatation. Spleen: Normal size.  No focal lesions. Adrenals/Urinary Tract: The adrenal glands and kidneys are unremarkable. Stable renal scarring changes and renal cortical thinning. No worrisome bladder lesions. Stomach/Bowel: The stomach, duodenum, small bowel and colon are grossly normal. No acute inflammatory changes, mass lesions or obstructive findings. Moderate stool noted throughout the colon. Vascular/Lymphatic: Advanced atherosclerotic calcifications involving the aorta and branch vessels. No aneurysm or dissection. The major venous structures are patent. No mesenteric or retroperitoneal mass or adenopathy. Small scattered lymph nodes are stable. Reproductive: The prostate gland and seminal vesicles are unremarkable. Other: No pelvic mass or adenopathy. No free pelvic fluid collections. No inguinal mass or adenopathy. No abdominal wall hernia or subcutaneous lesions. Musculoskeletal: Advanced  degenerative changes involving the spine and osteoporosis but no obvious bone lesions. IMPRESSION: 1. Significant interval regression of left lower lobe lung lesion and left hilar/infrahilar mass. 2. Regression of mediastinal lymphadenopathy. 3. Radiation changes involving the left lower lobe. 4. Moderate-sized left effusion and small right effusion with overlying atelectasis. 5. No findings for abdominal/pelvic metastatic disease. 6. New sclerotic changes at T5 and T6. Findings could be due to avascular necrosis/kummel's disease. Metastatic disease is also possible. MRI thoracic spine without with contrast may be helpful for further evaluation. Electronically Signed   By: Marijo Sanes M.D.   On: 02/18/2017 19:44   Mr Jeri Cos YS Contrast  Result Date: 02/16/2017 CLINICAL DATA:  S RS restaging.  Left cerebellar mass. EXAM: MRI HEAD WITHOUT AND WITH CONTRAST TECHNIQUE: Multiplanar, multiecho pulse sequences of the brain and surrounding structures were obtained without and with intravenous contrast. CONTRAST:  70m MULTIHANCE GADOBENATE DIMEGLUMINE 529 MG/ML IV SOLN COMPARISON:  11/02/2016.  08/06/2016. FINDINGS: Brain: No foci of restricted diffusion. Cerebral hemispheres again show moderate chronic small-vessel ischemic changes. No large vessel territory insult. In the posterior fossa, the patient has had left-sided craniotomy and partial resection of the left cerebellum. There has been successful interval treatment of the large left cerebellar lesion. There is atrophy and gliosis in the left cerebellar hemisphere. Large tumor mass is completely gone. There are 2 punctate foci of enhancement on the left at the middle cerebellar peduncle and medial left cerebellar hemisphere. No mass effect. No sign of vasogenic edema. Residual hemosiderin related to previous treatment. No new lesion elsewhere. Vascular: Major vessels at the base of the brain show flow. Skull and upper cervical spine: Otherwise negative  Sinuses/Orbits: Clear/normal Other: None IMPRESSION: Excellent post treatment appearance. Large left cerebellar tumor mass completely inapparent presently. Atrophy and gliosis of the left cerebellar hemisphere. Two punctate foci of enhancement, 1 in the left middle cerebellar peduncle in the other in the medial inferior cerebellar hemisphere which may relate to previous treatment and can be followed on subsequent examinations. Electronically Signed   By: MNelson ChimesM.D.   On: 02/16/2017 13:09   Ct Abdomen Pelvis W Contrast  Result Date: 02/18/2017 CLINICAL DATA:  Restaging lung cancer. Patient has known brain metastasis. EXAM: CT CHEST, ABDOMEN, AND PELVIS WITH CONTRAST TECHNIQUE: Multidetector CT imaging of the chest, abdomen and pelvis was performed following the standard protocol during bolus administration of intravenous contrast.  CONTRAST:  1100m ISOVUE-300 IOPAMIDOL (ISOVUE-300) INJECTION 61% COMPARISON:  CT scan 08/04/2016 and PET-CT 09/20/2016 FINDINGS: CT CHEST FINDINGS Cardiovascular: The heart is normal in size. No pericardial effusion. Stable tortuosity and dense calcification of the thoracic aorta and branch vessels including three-vessel coronary artery calcifications. Stable surgical changes from bypass surgery. Mediastinum/Nodes: Interval regression of mediastinal and hilar lymphadenopathy. 14 mm precarinal lymph node now measures 7 mm. Large left hilar nodal mass previously measured 5.6 x 3.4 cm and now measures a maximum of 15 mm. The 13.5 cm right-sided subcarinal adenopathy previously measured 17 mm. Lungs/Pleura: Stable emphysematous changes. The left lower lobe lung mass is not identified. There are changes of radiation fibrosis involving the left lower lobe. There is a moderate left-sided pleural effusion with overlying atelectasis. Small right pleural effusion with overlying atelectasis. No obvious pleural nodules. Musculoskeletal: No chest wall mass, supraclavicular or axillary  lymphadenopathy. Small thyroid gland nodules are stable. Right Port-A-Cath is in good position. New sclerotic changes at T5 and T6 with a linear band of gas in the T6 vertebral body. Findings could be due to avascular necrosis/Kummel's disease. Metastatic disease is also possible. MRI of the thoracic spine without and with contrast may be helpful for further evaluation. I do not see any other obvious bone lesions. CT ABDOMEN PELVIS FINDINGS Hepatobiliary: No focal hepatic lesions to suggest metastatic disease. The gallbladder is normal. No common bile duct dilatation. Pancreas: No mass, inflammation or ductal dilatation. Spleen: Normal size.  No focal lesions. Adrenals/Urinary Tract: The adrenal glands and kidneys are unremarkable. Stable renal scarring changes and renal cortical thinning. No worrisome bladder lesions. Stomach/Bowel: The stomach, duodenum, small bowel and colon are grossly normal. No acute inflammatory changes, mass lesions or obstructive findings. Moderate stool noted throughout the colon. Vascular/Lymphatic: Advanced atherosclerotic calcifications involving the aorta and branch vessels. No aneurysm or dissection. The major venous structures are patent. No mesenteric or retroperitoneal mass or adenopathy. Small scattered lymph nodes are stable. Reproductive: The prostate gland and seminal vesicles are unremarkable. Other: No pelvic mass or adenopathy. No free pelvic fluid collections. No inguinal mass or adenopathy. No abdominal wall hernia or subcutaneous lesions. Musculoskeletal: Advanced degenerative changes involving the spine and osteoporosis but no obvious bone lesions. IMPRESSION: 1. Significant interval regression of left lower lobe lung lesion and left hilar/infrahilar mass. 2. Regression of mediastinal lymphadenopathy. 3. Radiation changes involving the left lower lobe. 4. Moderate-sized left effusion and small right effusion with overlying atelectasis. 5. No findings for abdominal/pelvic  metastatic disease. 6. New sclerotic changes at T5 and T6. Findings could be due to avascular necrosis/kummel's disease. Metastatic disease is also possible. MRI thoracic spine without with contrast may be helpful for further evaluation. Electronically Signed   By: PMarijo SanesM.D.   On: 02/18/2017 19:44   Ir Thoracentesis Asp Pleural Space W/img Guide  Result Date: 03/09/2017 INDICATION: Patient with history of lung cancer, recurrent pleural effusion. Request is made for therapeutic left thoracentesis. EXAM: ULTRASOUND GUIDED THERAPEUTIC THORACENTESIS MEDICATIONS: 10 mL 2% lidocaine COMPLICATIONS: None immediate. PROCEDURE: An ultrasound guided thoracentesis was thoroughly discussed with the patient and questions answered. The benefits, risks, alternatives and complications were also discussed. The patient understands and wishes to proceed with the procedure. Written consent was obtained. Ultrasound was performed to localize and mark an adequate pocket of fluid in the left chest. The area was then prepped and draped in the normal sterile fashion. 1% Lidocaine was used for local anesthesia. Under ultrasound guidance a Safe-T-Centesis catheter was introduced.  Thoracentesis was performed. The catheter was removed and a dressing applied. FINDINGS: A total of approximately 900 mL of amber fluid was removed. IMPRESSION: Successful ultrasound guided therapeutic thoracentesis yielding 900 mL of pleural fluid. Read by: Brynda Greathouse PA-C Electronically Signed   By: Corrie Mckusick D.O.   On: 03/09/2017 14:28    ASSESSMENT AND PLAN: This is a very pleasant 84 years old white male with metastatic non-small cell lung cancer, squamous cell carcinoma with negative PDL 1 expression and enlarging solitary brain metastasis.  The patient completed palliative radiotherapy to the brain metastasis. He completed systemic chemotherapy with carboplatin, paclitaxel and Keytruda status post 4 cycles.  He has no evidence for  disease progression with this treatment. I recommended for the patient to proceed with the first cycle of his maintenance treatment with Keytruda. I will see him back for follow-up visit in 3 weeks for evaluation before cycle #2. The patient was advised to call immediately if he has any concerning symptoms in the interval. For the chemotherapy-induced anemia, we will continue to monitor him closely and consider the patient for PRBCs transfusion if his hemoglobin is less than 8.0 G/DL.  The patient voices understanding of current disease status and treatment options and is in agreement with the current care plan. All questions were answered. The patient knows to call the clinic with any problems, questions or concerns. We can certainly see the patient much sooner if necessary.  Disclaimer: This note was dictated with voice recognition software. Similar sounding words can inadvertently be transcribed and may not be corrected upon review.

## 2017-03-14 NOTE — Progress Notes (Signed)
Dr. Julien Nordmann okay to tx with plt 81.

## 2017-03-14 NOTE — Telephone Encounter (Signed)
Gave avs and calendar for February and march °

## 2017-03-15 ENCOUNTER — Other Ambulatory Visit: Payer: Self-pay | Admitting: Radiation Therapy

## 2017-03-15 DIAGNOSIS — C7931 Secondary malignant neoplasm of brain: Secondary | ICD-10-CM

## 2017-03-15 DIAGNOSIS — C7949 Secondary malignant neoplasm of other parts of nervous system: Principal | ICD-10-CM

## 2017-03-23 ENCOUNTER — Other Ambulatory Visit: Payer: Medicare Other

## 2017-04-04 ENCOUNTER — Other Ambulatory Visit: Payer: Self-pay | Admitting: Oncology

## 2017-04-04 ENCOUNTER — Inpatient Hospital Stay: Payer: Medicare Other

## 2017-04-04 ENCOUNTER — Telehealth: Payer: Self-pay | Admitting: Oncology

## 2017-04-04 ENCOUNTER — Ambulatory Visit (HOSPITAL_COMMUNITY)
Admission: RE | Admit: 2017-04-04 | Discharge: 2017-04-04 | Disposition: A | Discharge status: Home / Self Care | Payer: Medicare Other | Source: Ambulatory Visit | Attending: Oncology | Admitting: Oncology

## 2017-04-04 ENCOUNTER — Encounter: Payer: Self-pay | Admitting: Oncology

## 2017-04-04 ENCOUNTER — Inpatient Hospital Stay (HOSPITAL_BASED_OUTPATIENT_CLINIC_OR_DEPARTMENT_OTHER): Payer: Medicare Other | Admitting: Oncology

## 2017-04-04 VITALS — BP 104/54 | HR 54 | Temp 97.6°F | Resp 17 | Ht 71.0 in | Wt 164.0 lb

## 2017-04-04 DIAGNOSIS — C3492 Malignant neoplasm of unspecified part of left bronchus or lung: Secondary | ICD-10-CM | POA: Insufficient documentation

## 2017-04-04 DIAGNOSIS — R0600 Dyspnea, unspecified: Secondary | ICD-10-CM | POA: Diagnosis not present

## 2017-04-04 DIAGNOSIS — R918 Other nonspecific abnormal finding of lung field: Secondary | ICD-10-CM | POA: Diagnosis not present

## 2017-04-04 DIAGNOSIS — E119 Type 2 diabetes mellitus without complications: Secondary | ICD-10-CM

## 2017-04-04 DIAGNOSIS — Z5112 Encounter for antineoplastic immunotherapy: Secondary | ICD-10-CM | POA: Diagnosis not present

## 2017-04-04 DIAGNOSIS — C3432 Malignant neoplasm of lower lobe, left bronchus or lung: Secondary | ICD-10-CM | POA: Diagnosis not present

## 2017-04-04 DIAGNOSIS — D6481 Anemia due to antineoplastic chemotherapy: Secondary | ICD-10-CM | POA: Diagnosis not present

## 2017-04-04 DIAGNOSIS — I700 Atherosclerosis of aorta: Secondary | ICD-10-CM | POA: Diagnosis not present

## 2017-04-04 DIAGNOSIS — C7931 Secondary malignant neoplasm of brain: Secondary | ICD-10-CM | POA: Diagnosis not present

## 2017-04-04 DIAGNOSIS — J189 Pneumonia, unspecified organism: Secondary | ICD-10-CM

## 2017-04-04 DIAGNOSIS — R5382 Chronic fatigue, unspecified: Secondary | ICD-10-CM

## 2017-04-04 DIAGNOSIS — J90 Pleural effusion, not elsewhere classified: Secondary | ICD-10-CM | POA: Diagnosis not present

## 2017-04-04 LAB — CBC WITH DIFFERENTIAL/PLATELET
BASOS PCT: 1 %
Basophils Absolute: 0.1 10*3/uL (ref 0.0–0.1)
Eosinophils Absolute: 0.8 10*3/uL — ABNORMAL HIGH (ref 0.0–0.5)
Eosinophils Relative: 12 %
HEMATOCRIT: 31.4 % — AB (ref 38.4–49.9)
HEMOGLOBIN: 10.4 g/dL — AB (ref 13.0–17.1)
LYMPHS ABS: 0.5 10*3/uL — AB (ref 0.9–3.3)
Lymphocytes Relative: 7 %
MCH: 35.9 pg — ABNORMAL HIGH (ref 27.2–33.4)
MCHC: 33.2 g/dL (ref 32.0–36.0)
MCV: 108.2 fL — ABNORMAL HIGH (ref 79.3–98.0)
MONOS PCT: 9 %
Monocytes Absolute: 0.6 10*3/uL (ref 0.1–0.9)
NEUTROS ABS: 4.5 10*3/uL (ref 1.5–6.5)
NEUTROS PCT: 71 %
Platelets: 130 10*3/uL — ABNORMAL LOW (ref 140–400)
RBC: 2.9 MIL/uL — ABNORMAL LOW (ref 4.20–5.82)
RDW: 16.9 % — ABNORMAL HIGH (ref 11.0–14.6)
WBC: 6.4 10*3/uL (ref 4.0–10.3)

## 2017-04-04 LAB — COMPREHENSIVE METABOLIC PANEL
ALBUMIN: 3 g/dL — AB (ref 3.5–5.0)
ALK PHOS: 57 U/L (ref 40–150)
ALT: 17 U/L (ref 0–55)
ANION GAP: 8 (ref 3–11)
AST: 18 U/L (ref 5–34)
BILIRUBIN TOTAL: 0.4 mg/dL (ref 0.2–1.2)
BUN: 23 mg/dL (ref 7–26)
CALCIUM: 9.1 mg/dL (ref 8.4–10.4)
CO2: 24 mmol/L (ref 22–29)
CREATININE: 0.91 mg/dL (ref 0.70–1.30)
Chloride: 106 mmol/L (ref 98–109)
GFR calc Af Amer: >60 mL/min (ref >60)
GFR calc non Af Amer: >60 mL/min (ref >60)
GLUCOSE: 198 mg/dL — AB (ref 70–140)
Potassium: 4.3 mmol/L (ref 3.5–5.1)
Sodium: 138 mmol/L (ref 136–145)
TOTAL PROTEIN: 6.2 g/dL — AB (ref 6.4–8.3)

## 2017-04-04 LAB — TSH: TSH: 1.001 u[IU]/mL (ref 0.320–4.118)

## 2017-04-04 MED ORDER — SODIUM CHLORIDE 0.9 % IV SOLN
Freq: Once | INTRAVENOUS | Status: AC
  Administered 2017-04-04: 13:00:00 via INTRAVENOUS

## 2017-04-04 MED ORDER — HEPARIN SOD (PORK) LOCK FLUSH 100 UNIT/ML IV SOLN
500.0000 [IU] | Freq: Once | INTRAVENOUS | Status: AC | PRN
  Administered 2017-04-04: 500 [IU]
  Filled 2017-04-04: qty 5

## 2017-04-04 MED ORDER — AMOXICILLIN-POT CLAVULANATE 875-125 MG PO TABS: 1.0000 | ORAL_TABLET | Freq: Two times a day (BID) | ORAL | 0 refills | Status: DC

## 2017-04-04 MED ORDER — SODIUM CHLORIDE 0.9 % IV SOLN
200.0000 mg | Freq: Once | INTRAVENOUS | Status: AC
  Administered 2017-04-04: 200 mg via INTRAVENOUS
  Filled 2017-04-04: qty 8

## 2017-04-04 MED ORDER — SODIUM CHLORIDE 0.9% FLUSH
10.0000 mL | INTRAVENOUS | Status: DC | PRN
  Administered 2017-04-04: 10 mL
  Filled 2017-04-04: qty 10

## 2017-04-04 NOTE — Patient Instructions (Signed)
[facility] Discharge Instructions for Patients Receiving Chemotherapy  Today you received the following chemotherapy agents: Pembrolizumab [name]).  To help prevent nausea and vomiting after your treatment, we encourage you to take your nausea medication as prescribed.  If you develop nausea and vomiting that is not controlled by your nausea medication, call the clinic.   BELOW ARE SYMPTOMS THAT SHOULD BE REPORTED IMMEDIATELY:  *FEVER GREATER THAN 100.5 F  *CHILLS WITH OR WITHOUT FEVER  NAUSEA AND VOMITING THAT IS NOT CONTROLLED WITH YOUR NAUSEA MEDICATION  *UNUSUAL SHORTNESS OF BREATH  *UNUSUAL BRUISING OR BLEEDING  TENDERNESS IN MOUTH AND THROAT WITH OR WITHOUT PRESENCE OF ULCERS  *URINARY PROBLEMS  *BOWEL PROBLEMS  UNUSUAL RASH Items with * indicate a potential emergency and should be followed up as soon as possible.  Feel free to call the clinic should you have any questions or concerns. The clinic phone number is [phone].  Please show the [facility] at check-in to the Emergency Department and triage nurse.

## 2017-04-04 NOTE — Telephone Encounter (Signed)
Scheduled appt per 2/26 los - patient to get schedule in treatment area.

## 2017-04-04 NOTE — Progress Notes (Signed)
Chest x-ray results returned showing small pleural effusions bilaterally greatest on the left.  There is an increased density in the left hemithorax which could reflect atelectasis or infiltrate.  I discussed the results with the patient.  A prescription for Augmentin 875 mg twice daily times 10 days was sent to his pharmacy.

## 2017-04-04 NOTE — Progress Notes (Addendum)
[place] OFFICE PROGRESS NOTE  [name], MD [address] [place] [address]  DIAGNOSIS: Stage IV (T1b, N3, M1c) non-small cell lung cancer, squamous cell carcinoma diagnosed in September 2018 presented with left infrahilar mass in addition to mediastinal and supraclavicular lymphadenopathy as well as metastatic brain lesion.  PDL1 expression 0%.  PRIOR THERAPY: 1) Palliative radiotherapy to the brain metastasis. 2)  Systemic chemotherapy with carboplatin for AUC of 5, paclitaxel 175 MG/M2 and Ketruda (pembrolizumab) 200 MG IV every 3 weeks. First dose 11/21/2016.  Status post 4 cycles.  CURRENT THERAPY: Maintenance treatment with single agent Keytruda 200 mg IV every 3 weeks.  First cycle March 14, 2017.  That is post 1 cycle.  INTERVAL HISTORY: Steve Frank 84 y.o. male returns for routine follow-up visit accompanied by his wife.  The patient tolerated his last cycle of Keytruda fairly well.  He continues to have shortness of breath with exertion.  He reports an intermittent dry cough but no hemoptysis.  Denies chest pain.  The patient denies fevers and chills.  Denies nausea, vomiting, constipation, diarrhea.  Denies rashes.  The patient is here for evaluation prior to starting cycle #2 of maintenance treatment with single agent Keytruda.  MEDICAL HISTORY: Past Medical History:  Diagnosis Date  . Arthritis   . BPH (benign prostatic hyperplasia)   . CAD (coronary artery disease)    s/p 2 cabg's  . Cancer (HCC)    Basal and squamous cell  skin cancer  . Cerebellar mass   . Chest mass   . Diabetes ([place])    Type II  . GERD (gastroesophageal reflux disease)   . Head injury with loss of consciousness [facility])    age 51 or 47-   . Heart attack ([place])   . HLD (hyperlipidemia)   . Hypertension   . Macular degeneration   . Pneumonia     x 2 last 1071  . Stage IV squamous cell carcinoma of left lung ([place]) 10/20/2016    ALLERGIES:  is allergic to  ciprofloxacin.  MEDICATIONS:  Current Outpatient Medications  Medication Sig Dispense Refill  . acetaminophen (TYLENOL) 325 MG tablet Take 650 mg by mouth every 6 (six) hours as needed (for pain/headaches.).     Marland Kitchen albuterol (PROVENTIL HFA;VENTOLIN HFA) 108 (90 Base) MCG/ACT inhaler Inhale 2 puffs into the lungs every 6 (six) hours as needed (for wheezing/shortness of breath).     . B-D INS SYR ULTRAFINE 1CC/31G 31G X 5/16" 1 ML MISC     . carvedilol (COREG) 25 MG tablet Take 25 mg by mouth 2 (two) times daily with a meal.     . Cholecalciferol (VITAMIN D-1000 MAX ST) 1000 units tablet Take 1,000 Units by mouth daily.     . clotrimazole-betamethasone (LOTRISONE) cream Apply 1 application topically 2 (two) times daily.    Marland Kitchen docusate sodium (COLACE) 100 MG capsule Take 100 mg by mouth 2 (two) times daily as needed (for constipation.).    Marland Kitchen gabapentin (NEURONTIN) 400 MG capsule Take 400 mg by mouth at bedtime.    Marland Kitchen guaiFENesin-dextromethorphan (ROBITUSSIN DM) 100-10 MG/5ML syrup Take 15 mLs every 4 (four) hours as needed by mouth for cough.    Marland Kitchen HUMALOG 100 UNIT/ML injection INJECT 2 TO 0.12MLS (2-8 UNITS) UNDER THE SKIN 3 TIMES A DAY BEFORE MEALS, AS PER SLIDING SCALE  0  . LANTUS 100 UNIT/ML injection INJECT 0.15ML (15 UNITS TOTAL) UNDER THE SKIN DAILY AT BEDTIME.  0  .  lidocaine-prilocaine (EMLA) cream Apply 1 application topically as needed. 30 g 0  . magnesium hydroxide (MILK OF MAGNESIA) 400 MG/5ML suspension Take 30 mLs by mouth daily as needed (for constipation.).    Marland Kitchen mirtazapine (REMERON) 15 MG tablet Take 15 mg by mouth at bedtime.  0  . Multiple Vitamin (MULTIVITAMIN WITH MINERALS) TABS tablet Take 1 tablet by mouth daily.    Marland Kitchen neomycin-bacitracin-polymyxin (NEOSPORIN) [phone] ointment Apply 1 application topically at bedtime.    Marland Kitchen omeprazole (PRILOSEC) 20 MG capsule Take 20 mg by mouth at bedtime.     . polyethylene glycol (MIRALAX / GLYCOLAX) packet Take 17 g by mouth daily as  needed (for constipation.).     Marland Kitchen prochlorperazine (COMPAZINE) 10 MG tablet Take 1 tablet (10 mg total) by mouth every 6 (six) hours as needed for nausea or vomiting. (Patient not taking: Reported on 01/24/2017) 30 tablet 0  . rosuvastatin (CRESTOR) 20 MG tablet Take 20 mg by mouth daily.    Marland Kitchen triamcinolone (KENALOG) 0.025 % ointment Apply 1 application 2 (two) times daily topically. (Patient not taking: Reported on 03/14/2017) 30 g 1   No current facility-administered medications for this visit.     SURGICAL HISTORY:  Past Surgical History:  Procedure Laterality Date  . CARDIAC CATHETERIZATION    . COLONOSCOPY W/ POLYPECTOMY    . CORONARY ARTERY BYPASS GRAFT     Dr Prescott Gum [facility] 07/2007  . CORONARY ARTERY BYPASS GRAFT     1st one was @ [facility] 26years ago 12/1991  . CRANIECTOMY FOR EXCISION OF BRAIN TUMOR INFRATENTORIAL / POSTERIOR FOSSA  08/05/2016   Dr Rebecka Apley  @ [facility]/Neurosurgery  . FLEXIBLE BRONCHOSCOPY  08/07/2016   Dr Gwenevere Ghazi  . IR FLUORO GUIDE PORT INSERTION RIGHT  11/18/2016  . IR THORACENTESIS ASP PLEURAL SPACE W/IMG GUIDE  03/09/2017  . IR US GUIDE VASC ACCESS RIGHT  11/18/2016  . LUNG BIOPSY N/A 10/14/2016   Procedure: LUNG BIOPSY;  Surgeon: Grace Isaac, MD;  Location: [place];  Service: Thoracic;  Laterality: N/A;  . VIDEO BRONCHOSCOPY WITH ENDOBRONCHIAL ULTRASOUND N/A 10/14/2016   Procedure: VIDEO BRONCHOSCOPY WITH ENDOBRONCHIAL ULTRASOUND;  Surgeon: Grace Isaac, MD;  Location: [place];  Service: Thoracic;  Laterality: N/A;    REVIEW OF SYSTEMS:   Review of Systems  Constitutional: Negative for appetite change, chills, fatigue, fever and unexpected weight change.  HENT:   Negative for mouth sores, nosebleeds, sore throat and trouble swallowing.   Eyes: Negative for eye problems and icterus.  Respiratory: Negative for hemoptysis and wheezing.  Positive for nonproductive cough and shortness of breath with exertion. Cardiovascular: Negative for chest  pain and leg swelling.  Gastrointestinal: Negative for abdominal pain, constipation, diarrhea, nausea and vomiting.  Genitourinary: Negative for bladder incontinence, difficulty urinating, dysuria, frequency and hematuria.   Musculoskeletal: Negative for back pain, gait problem, neck pain and neck stiffness.  Skin: Negative for itching and rash.  Neurological: Negative for dizziness, extremity weakness, headaches, light-headedness and seizures. Positive for feeling off balance. Hematological: Negative for adenopathy. Does not bruise/bleed easily.  Psychiatric/Behavioral: Negative for confusion, depression and sleep disturbance. The patient is not nervous/anxious.     PHYSICAL EXAMINATION:  Blood pressure (!) 104/54, pulse (!) 54, temperature 97.6 F (36.4 C), temperature source Axillary, resp. rate 17, height _0  (1.803 m), weight 164 lb (74.4 kg), SpO2 97 %.  ECOG PERFORMANCE STATUS: 1 - Symptomatic but completely ambulatory  Physical Exam  Constitutional: Oriented to person, place, and  time and well-developed, well-nourished, and in no distress. No distress.  HENT:  Head: Normocephalic and atraumatic.  Mouth/Throat: Oropharynx is clear and moist. No oropharyngeal exudate.  Eyes: Conjunctivae are normal. Right eye exhibits no discharge. Left eye exhibits no discharge. No scleral icterus.  Neck: Normal range of motion. Neck supple.  Cardiovascular: Normal rate, regular rhythm, normal heart sounds and intact distal pulses.   Pulmonary/Chest: Effort normal. No respiratory distress. No wheezes. No rales.  Diminished breath sounds to the bilateral lung bases left greater than right.  Abdominal: Soft. Bowel sounds are normal. Exhibits no distension and no mass. There is no tenderness.  Musculoskeletal: Normal range of motion. Exhibits no edema.  Lymphadenopathy:    No cervical adenopathy.  Neurological: Alert and oriented to person, place, and time. Exhibits normal muscle tone.  Coordination normal.  Skin: Skin is warm and dry. No rash noted. Not diaphoretic. No erythema. No pallor.  Psychiatric: Mood, memory and judgment normal.  Vitals reviewed.  LABORATORY DATA: Lab Results  Component Value Date   WBC 6.4 04/04/2017   HGB 10.4 (L) 04/04/2017   HCT 31.4 (L) 04/04/2017   MCV 108.2 (H) 04/04/2017   PLT 130 (L) 04/04/2017      Chemistry      Component Value Date/Time   NA 138 04/04/2017 0950   NA 138 02/08/2017 1118   K 4.3 04/04/2017 0950   K 4.5 02/08/2017 1118   CL 106 04/04/2017 0950   CO2 24 04/04/2017 0950   CO2 24 02/08/2017 1118   BUN 23 04/04/2017 0950   BUN 16.4 02/08/2017 1118   CREATININE 0.91 04/04/2017 0950   CREATININE 0.88 03/06/2017 1423   CREATININE 0.9 02/08/2017 1118      Component Value Date/Time   CALCIUM 9.1 04/04/2017 0950   CALCIUM 9.0 02/08/2017 1118   ALKPHOS 57 04/04/2017 0950   ALKPHOS 72 02/08/2017 1118   AST 18 04/04/2017 0950   AST 17 03/06/2017 1423   AST 19 02/08/2017 1118   ALT 17 04/04/2017 0950   ALT 14 03/06/2017 1423   ALT 15 02/08/2017 1118   BILITOT 0.4 04/04/2017 0950   BILITOT 0.5 03/06/2017 1423   BILITOT 0.53 02/08/2017 1118       RADIOGRAPHIC STUDIES:  Dg Chest 1 View  Result Date: 03/09/2017 CLINICAL DATA:  Followup left thoracentesis. EXAM: CHEST 1 VIEW COMPARISON:  03/06/2017 FINDINGS: Previous median sternotomy and CABG. Power port in place from a right internal jugular approach with the tip in the SVC 1 cm above the right atrium. Less pleural density on the left. No pneumothorax. Better aeration of the left lower lobe. IMPRESSION: Less pleural fluid on the left following thoracentesis. No pneumothorax. Electronically Signed   By: Nelson Chimes M.D.   On: 03/09/2017 13:53   Dg Chest 1 View  Result Date: 03/06/2017 CLINICAL DATA:  Pt hx left lung ca, hx antineoplastic chemo, pt c/o weakness, sob, unable to stand. EXAM: CHEST 1 VIEW COMPARISON:  02/08/2017 chest radiograph, chest CT  02/17/2017 FINDINGS: The port in the RIGHT chest wall. Persistent small moderate LEFT pleural effusion. Upper lungs are clear. No pneumothorax. IMPRESSION: Persistent moderate LEFT pleural effusion. Electronically Signed   By: Suzy Bouchard M.D.   On: 03/06/2017 14:18   Dg Chest 2 View  Result Date: 04/04/2017 CLINICAL DATA:  Increasing fatigue, shortness of breath, coughing, and weakness. Two thoracentesis ease since February 07, 2017. History of lung malignancy. EXAM: CHEST  2 VIEW COMPARISON:  Chest x-ray of March 09, 2017. FINDINGS: There is a small right and slightly larger left pleural effusion. These are more conspicuous than on the previous studies. There is increased parenchymal density posteriorly in the left lower hemithorax. The heart and pulmonary vascularity are normal. There are post CABG changes. There is calcification in the wall of the aortic arch. The power port catheter tip projects over the midportion of the SVC. There is multilevel degenerative disc disease of the thoracic spine with mild wedge compression of T6 or T7. IMPRESSION: Interval reaccumulation of small amounts of pleural fluid greatest on the left. Increased density in the posterior aspect of the left hemithorax likely reflects atelectasis or infiltrate. At minimum, followup PA and lateral chest X-ray is recommended in 3-4 weeks following trial of antibiotic therapy to ensure resolution and exclude underlying malignancy. Follow-up sooner is available upon request. Thoracic aortic atherosclerosis. Electronically Signed   By: David  Martinique M.D.   On: 04/04/2017 12:50   Ir Thoracentesis Asp Pleural Space W/img Guide  Result Date: 03/09/2017 INDICATION: Patient with history of lung cancer, recurrent pleural effusion. Request is made for therapeutic left thoracentesis. EXAM: ULTRASOUND GUIDED THERAPEUTIC THORACENTESIS MEDICATIONS: 10 mL 2% lidocaine COMPLICATIONS: None immediate. PROCEDURE: An ultrasound guided thoracentesis  was thoroughly discussed with the patient and questions answered. The benefits, risks, alternatives and complications were also discussed. The patient understands and wishes to proceed with the procedure. Written consent was obtained. Ultrasound was performed to localize and mark an adequate pocket of fluid in the left chest. The area was then prepped and draped in the normal sterile fashion. 1% Lidocaine was used for local anesthesia. Under ultrasound guidance a Safe-T-Centesis catheter was introduced. Thoracentesis was performed. The catheter was removed and a dressing applied. FINDINGS: A total of approximately 900 mL of amber fluid was removed. IMPRESSION: Successful ultrasound guided therapeutic thoracentesis yielding 900 mL of pleural fluid. Read by: Brynda Greathouse PA-C Electronically Signed   By: Corrie Mckusick D.O.   On: 03/09/2017 14:28     ASSESSMENT/PLAN:  Stage IV squamous cell carcinoma of left lung [facility]) This is a very pleasant 84 year old white male with metastatic non-small cell lung cancer, squamous cell carcinoma with negative PDL 1 expression and enlarging solitary brain metastasis.  The patient completed palliative radiotherapy to the brain metastasis. He completed systemic chemotherapy with carboplatin, paclitaxel and Keytruda status post 4 cycles.  He has no evidence for disease progression with this treatment. He is now on maintenance treatment with Keytruda.  Status post 1 cycle which she tolerated well.  Recommend that he proceed with cycle 2 as scheduled today.  For his increased dyspnea and nonproductive cough, will obtain a chest x-ray today.  May consider a thoracentesis depending the results of the chest x-ray.  The patient will follow-up in 3 weeks for evaluation prior to cycle 3 of his maintenance Keytruda.  The patient was advised to call immediately if he has any concerning symptoms in the interval. The patient voices understanding of current disease status and  treatment options and is in agreement with the current care plan. All questions were answered. The patient knows to call the clinic with any problems, questions or concerns. We can certainly see the patient much sooner if necessary.  Orders Placed This Encounter  Procedures  . DG Chest 2 View    Standing Status:   Future    Number of Occurrences:   1    Standing Expiration Date:   04/04/2018    Order Specific Question:  Reason for exam:    Answer:   Lung cancer. Hx of pleural effusion. Eval for recurrent pleural effusion.    Order Specific Question:   Preferred imaging location?    Answer:   [facility] AGPCNP-BC, [place] 04/04/17  Addendum: Chest x-ray shows small bilateral pleural effusions left greater than right.  There is an increased density in the posterior aspect of the left hemithorax which could represent atelectasis or infiltrate.  Results were discussed with the patient.  Will begin the patient on Augmentin 875 mg twice a day for 10 days.  He was instructed to call us if he develops any fevers, worsening of his cough, or increased shortness of breath.

## 2017-04-04 NOTE — Assessment & Plan Note (Signed)
This is a very pleasant 84 year old white male with metastatic non-small cell lung cancer, squamous cell carcinoma with negative PDL 1 expression and enlarging solitary brain metastasis.  The patient completed palliative radiotherapy to the brain metastasis. He completed systemic chemotherapy with carboplatin, paclitaxel and Keytruda status post 4 cycles.  He has no evidence for disease progression with this treatment. He is now on maintenance treatment with Keytruda.  Status post 1 cycle which she tolerated well.  Recommend that he proceed with cycle 2 as scheduled today.  For his increased dyspnea and nonproductive cough, will obtain a chest x-ray today.  May consider a thoracentesis depending the results of the chest x-ray.  The patient will follow-up in 3 weeks for evaluation prior to cycle 3 of his maintenance Keytruda.  The patient was advised to call immediately if he has any concerning symptoms in the interval. The patient voices understanding of current disease status and treatment options and is in agreement with the current care plan. All questions were answered. The patient knows to call the clinic with any problems, questions or concerns. We can certainly see the patient much sooner if necessary.

## 2017-04-25 ENCOUNTER — Inpatient Hospital Stay (HOSPITAL_BASED_OUTPATIENT_CLINIC_OR_DEPARTMENT_OTHER): Payer: Medicare Other | Admitting: Oncology

## 2017-04-25 ENCOUNTER — Inpatient Hospital Stay: Payer: Medicare Other | Attending: Internal Medicine

## 2017-04-25 ENCOUNTER — Inpatient Hospital Stay: Payer: Medicare Other

## 2017-04-25 ENCOUNTER — Encounter: Payer: Self-pay | Admitting: Oncology

## 2017-04-25 ENCOUNTER — Telehealth: Payer: Self-pay | Admitting: Oncology

## 2017-04-25 VITALS — BP 113/49 | HR 51 | Temp 97.5°F | Resp 18 | Ht 71.0 in | Wt 162.1 lb

## 2017-04-25 DIAGNOSIS — Z95828 Presence of other vascular implants and grafts: Secondary | ICD-10-CM

## 2017-04-25 DIAGNOSIS — R270 Ataxia, unspecified: Secondary | ICD-10-CM | POA: Diagnosis not present

## 2017-04-25 DIAGNOSIS — C3492 Malignant neoplasm of unspecified part of left bronchus or lung: Secondary | ICD-10-CM | POA: Insufficient documentation

## 2017-04-25 DIAGNOSIS — R0602 Shortness of breath: Secondary | ICD-10-CM

## 2017-04-25 DIAGNOSIS — R531 Weakness: Secondary | ICD-10-CM | POA: Diagnosis not present

## 2017-04-25 DIAGNOSIS — C3432 Malignant neoplasm of lower lobe, left bronchus or lung: Secondary | ICD-10-CM | POA: Diagnosis not present

## 2017-04-25 DIAGNOSIS — Z79899 Other long term (current) drug therapy: Secondary | ICD-10-CM | POA: Insufficient documentation

## 2017-04-25 DIAGNOSIS — I2510 Atherosclerotic heart disease of native coronary artery without angina pectoris: Secondary | ICD-10-CM | POA: Insufficient documentation

## 2017-04-25 DIAGNOSIS — C7931 Secondary malignant neoplasm of brain: Secondary | ICD-10-CM | POA: Diagnosis not present

## 2017-04-25 DIAGNOSIS — J90 Pleural effusion, not elsewhere classified: Secondary | ICD-10-CM | POA: Diagnosis not present

## 2017-04-25 DIAGNOSIS — R5382 Chronic fatigue, unspecified: Secondary | ICD-10-CM

## 2017-04-25 DIAGNOSIS — R05 Cough: Secondary | ICD-10-CM | POA: Insufficient documentation

## 2017-04-25 DIAGNOSIS — R278 Other lack of coordination: Secondary | ICD-10-CM

## 2017-04-25 DIAGNOSIS — Z5112 Encounter for antineoplastic immunotherapy: Secondary | ICD-10-CM | POA: Diagnosis not present

## 2017-04-25 LAB — CBC WITH DIFFERENTIAL/PLATELET
BASOS ABS: 0.1 10*3/uL (ref 0.0–0.1)
Basophils Relative: 1 %
EOS PCT: 10 %
Eosinophils Absolute: 0.6 10*3/uL — ABNORMAL HIGH (ref 0.0–0.5)
HCT: 33.5 % — ABNORMAL LOW (ref 38.4–49.9)
Hemoglobin: 11.1 g/dL — ABNORMAL LOW (ref 13.0–17.1)
LYMPHS PCT: 8 %
Lymphs Abs: 0.5 10*3/uL — ABNORMAL LOW (ref 0.9–3.3)
MCH: 34.9 pg — ABNORMAL HIGH (ref 27.2–33.4)
MCHC: 33.2 g/dL (ref 32.0–36.0)
MCV: 105.3 fL — AB (ref 79.3–98.0)
Monocytes Absolute: 0.5 10*3/uL (ref 0.1–0.9)
Monocytes Relative: 9 %
NEUTROS ABS: 4.5 10*3/uL (ref 1.5–6.5)
Neutrophils Relative %: 72 %
PLATELETS: 130 10*3/uL — AB (ref 140–400)
RBC: 3.18 MIL/uL — AB (ref 4.20–5.82)
RDW: 14.7 % — ABNORMAL HIGH (ref 11.0–14.6)
WBC: 6.3 10*3/uL (ref 4.0–10.3)

## 2017-04-25 LAB — COMPREHENSIVE METABOLIC PANEL
ALT: 16 U/L (ref 0–55)
ANION GAP: 6 (ref 3–11)
AST: 21 U/L (ref 5–34)
Albumin: 3.1 g/dL — ABNORMAL LOW (ref 3.5–5.0)
Alkaline Phosphatase: 59 U/L (ref 40–150)
BILIRUBIN TOTAL: 0.4 mg/dL (ref 0.2–1.2)
BUN: 23 mg/dL (ref 7–26)
CO2: 25 mmol/L (ref 22–29)
Calcium: 9.2 mg/dL (ref 8.4–10.4)
Chloride: 106 mmol/L (ref 98–109)
Creatinine, Ser: 0.87 mg/dL (ref 0.70–1.30)
Glucose, Bld: 167 mg/dL — ABNORMAL HIGH (ref 70–140)
POTASSIUM: 4.5 mmol/L (ref 3.5–5.1)
Sodium: 137 mmol/L (ref 136–145)
TOTAL PROTEIN: 6.2 g/dL — AB (ref 6.4–8.3)

## 2017-04-25 LAB — TSH: TSH: 1.109 u[IU]/mL (ref 0.320–4.118)

## 2017-04-25 MED ORDER — SODIUM CHLORIDE 0.9 % IV SOLN
Freq: Once | INTRAVENOUS | Status: AC
  Administered 2017-04-25: 12:00:00 via INTRAVENOUS

## 2017-04-25 MED ORDER — HEPARIN SOD (PORK) LOCK FLUSH 100 UNIT/ML IV SOLN
500.0000 [IU] | Freq: Once | INTRAVENOUS | Status: AC | PRN
  Administered 2017-04-25: 500 [IU]
  Filled 2017-04-25: qty 5

## 2017-04-25 MED ORDER — SODIUM CHLORIDE 0.9% FLUSH
10.0000 mL | INTRAVENOUS | Status: DC | PRN
  Administered 2017-04-25: 10 mL
  Filled 2017-04-25: qty 10

## 2017-04-25 MED ORDER — SODIUM CHLORIDE 0.9% FLUSH
10.0000 mL | Freq: Once | INTRAVENOUS | Status: AC
  Administered 2017-04-25: 10 mL
  Filled 2017-04-25: qty 10

## 2017-04-25 MED ORDER — SODIUM CHLORIDE 0.9 % IV SOLN
200.0000 mg | Freq: Once | INTRAVENOUS | Status: AC
  Administered 2017-04-25: 200 mg via INTRAVENOUS
  Filled 2017-04-25: qty 8

## 2017-04-25 NOTE — Assessment & Plan Note (Addendum)
This is a very pleasant 84 year old white male with metastatic non-small cell lung cancer, squamous cell carcinoma with negative PDL 1 expression and enlarging solitary brain metastasis.  The patient completed palliative radiotherapy to the brain metastasis. Hecompletedsystemic chemotherapy with carboplatin, paclitaxel and Keytruda status post 4cycles. He has no evidence for disease progression with this treatment. He is now on maintenance treatment with Keytruda.  Status post 2 cycles which he tolerated well. Recommend that he proceed with cycle 3 as scheduled today.  The patient will have a restaging CT scan of the chest, abdomen, pelvis prior to his next visit.  The patient will follow-up in 3 weeks for evaluation prior to cycle 4 of his maintenance Keytruda and to review his restaging CT scan results.  For his shortness of breath with exertion, I suspect this is related to his deconditioning.  We discussed a referral to neuro rehabilitation to help with him being off balance, help with his deconditioning, and for speech therapy evaluation.  He was reminded to drink thickened liquids as previously recommended unless new recommendations are made.  Patient states understanding of the above instructions.  The patient was advised to call immediately if he has any concerning symptoms in the interval. The patient voices understanding of current disease status and treatment options and is in agreement with the current care plan. All questions were answered. The patient knows to call the clinic with any problems, questions or concerns. We can certainly see the patient much sooner if necessary.

## 2017-04-25 NOTE — Telephone Encounter (Signed)
Appts already scheduled per 3/19 los - no additional appts to add

## 2017-04-25 NOTE — Progress Notes (Signed)
[place] OFFICE PROGRESS NOTE  [name], MD [address] [place] [address]  DIAGNOSIS: Stage IV (T1b, N3, M1c) non-small cell lung cancer, squamous cell carcinoma diagnosed in September 2018 presented with left infrahilar mass in addition to mediastinal and supraclavicular lymphadenopathy as well as metastatic brain lesion.  PDL1 expression 0%.  PRIOR THERAPY: 1)Palliative radiotherapy to the brain metastasis. 2)Systemic chemotherapy with carboplatin for AUC of 5, paclitaxel 175 MG/M2 and Ketruda (pembrolizumab) 200 MG IV every 3 weeks. First dose 11/21/2016. Status post 4cycles.  CURRENT THERAPY: Maintenance treatment with single agent Keytruda 200 mg IV every 3 weeks. First cycle March 14, 2017.    Status post 2 cycles.  INTERVAL HISTORY: Steve Frank 84 y.o. male returns for routine follow-up visit accompanied by his wife.  The patient is feeling fine today except for ongoing dyspnea on exertion.  The patient tells me that he supposed to be drinking thickened liquids, but has not been doing this.  The patient's wife is worried that he may be aspirating.  He is not coughing after drinking fluids or eating food.  The patient denies fevers and chills.  Denies chest pain, shortness of breath at rest, cough, and hemoptysis.  Denies nausea, vomiting, constipation, diarrhea.  Denies skin rashes.  He reports being off balance at times.  The patient is here for evaluation prior to starting cycle #3 of his maintenance treatment with single agent Keytruda.  MEDICAL HISTORY: Past Medical History:  Diagnosis Date  . Arthritis   . BPH (benign prostatic hyperplasia)   . CAD (coronary artery disease)    s/p 2 cabg's  . Cancer (HCC)    Basal and squamous cell  skin cancer  . Cerebellar mass   . Chest mass   . Diabetes ([place])    Type II  . GERD (gastroesophageal reflux disease)   . Head injury with loss of consciousness [facility])    age 52 or 59-   . Heart  attack ([place])   . HLD (hyperlipidemia)   . Hypertension   . Macular degeneration   . Pneumonia     x 2 last 1071  . Stage IV squamous cell carcinoma of left lung ([place]) 10/20/2016    ALLERGIES:  is allergic to ciprofloxacin.  MEDICATIONS:  Current Outpatient Medications  Medication Sig Dispense Refill  . albuterol (PROVENTIL HFA;VENTOLIN HFA) 108 (90 Base) MCG/ACT inhaler Inhale 2 puffs into the lungs every 6 (six) hours as needed (for wheezing/shortness of breath).     . B-D INS SYR ULTRAFINE 1CC/31G 31G X 5/16" 1 ML MISC     . Cholecalciferol (VITAMIN D-1000 MAX ST) 1000 units tablet Take 1,000 Units by mouth daily.     . clotrimazole-betamethasone (LOTRISONE) cream Apply 1 application topically 2 (two) times daily.    Marland Kitchen docusate sodium (COLACE) 100 MG capsule Take 100 mg by mouth 2 (two) times daily as needed (for constipation.).    Marland Kitchen gabapentin (NEURONTIN) 400 MG capsule Take 400 mg by mouth at bedtime.    Marland Kitchen guaiFENesin-dextromethorphan (ROBITUSSIN DM) 100-10 MG/5ML syrup Take 15 mLs every 4 (four) hours as needed by mouth for cough.    Marland Kitchen HUMALOG 100 UNIT/ML injection INJECT 2 TO 0.12MLS (2-8 UNITS) UNDER THE SKIN 3 TIMES A DAY BEFORE MEALS, AS PER SLIDING SCALE  0  . LANTUS 100 UNIT/ML injection INJECT 0.15ML (15 UNITS TOTAL) UNDER THE SKIN DAILY AT BEDTIME.  0  . magnesium hydroxide (MILK OF MAGNESIA) 400 MG/5ML suspension  Take 30 mLs by mouth daily as needed (for constipation.).    Marland Kitchen mirtazapine (REMERON) 15 MG tablet Take 15 mg by mouth at bedtime.  0  . Multiple Vitamin (MULTIVITAMIN WITH MINERALS) TABS tablet Take 1 tablet by mouth daily.    Marland Kitchen neomycin-bacitracin-polymyxin (NEOSPORIN) [phone] ointment Apply 1 application topically at bedtime.    Marland Kitchen omeprazole (PRILOSEC) 20 MG capsule Take 20 mg by mouth at bedtime.     . polyethylene glycol (MIRALAX / GLYCOLAX) packet Take 17 g by mouth daily as needed (for constipation.).     Marland Kitchen rosuvastatin (CRESTOR) 20 MG tablet Take 20 mg by  mouth daily.    Marland Kitchen acetaminophen (TYLENOL) 325 MG tablet Take 650 mg by mouth every 6 (six) hours as needed (for pain/headaches.).     Marland Kitchen lidocaine-prilocaine (EMLA) cream Apply 1 application topically as needed. 30 g 0  . prochlorperazine (COMPAZINE) 10 MG tablet Take 1 tablet (10 mg total) by mouth every 6 (six) hours as needed for nausea or vomiting. (Patient not taking: Reported on 04/25/2017) 30 tablet 0  . tamsulosin (FLOMAX) 0.4 MG CAPS capsule Take 1 capsule by mouth once.    . triamcinolone (KENALOG) 0.025 % ointment Apply 1 application 2 (two) times daily topically. (Patient not taking: Reported on 03/14/2017) 30 g 1   No current facility-administered medications for this visit.    Facility-Administered Medications Ordered in Other Visits  Medication Dose Route Frequency Provider Last Rate Last Dose  . sodium chloride flush (NS) 0.9 % injection 10 mL  10 mL Intracatheter PRN Curt Bears, MD   10 mL at 04/25/17 1353    SURGICAL HISTORY:  Past Surgical History:  Procedure Laterality Date  . CARDIAC CATHETERIZATION    . COLONOSCOPY W/ POLYPECTOMY    . CORONARY ARTERY BYPASS GRAFT     Dr Prescott Gum [facility] 07/2007  . CORONARY ARTERY BYPASS GRAFT     1st one was @ [facility] 26years ago 12/1991  . CRANIECTOMY FOR EXCISION OF BRAIN TUMOR INFRATENTORIAL / POSTERIOR FOSSA  08/05/2016   Dr Rebecka Apley  @ [facility]/Neurosurgery  . FLEXIBLE BRONCHOSCOPY  08/07/2016   Dr Gwenevere Ghazi  . IR FLUORO GUIDE PORT INSERTION RIGHT  11/18/2016  . IR THORACENTESIS ASP PLEURAL SPACE W/IMG GUIDE  03/09/2017  . IR US GUIDE VASC ACCESS RIGHT  11/18/2016  . LUNG BIOPSY N/A 10/14/2016   Procedure: LUNG BIOPSY;  Surgeon: Grace Isaac, MD;  Location: [place];  Service: Thoracic;  Laterality: N/A;  . VIDEO BRONCHOSCOPY WITH ENDOBRONCHIAL ULTRASOUND N/A 10/14/2016   Procedure: VIDEO BRONCHOSCOPY WITH ENDOBRONCHIAL ULTRASOUND;  Surgeon: Grace Isaac, MD;  Location: [place];  Service: Thoracic;  Laterality:  N/A;    REVIEW OF SYSTEMS:   Review of Systems  Constitutional: Negative for appetite change, chills, fatigue, fever and unexpected weight change.  HENT:   Negative for mouth sores, nosebleeds, sore throat and trouble swallowing.   Eyes: Negative for eye problems and icterus.  Respiratory: Negative for cough, hemoptysis, and wheezing.  Positive for shortness of breath with exertion. Cardiovascular: Negative for chest pain and leg swelling.  Gastrointestinal: Negative for abdominal pain, constipation, diarrhea, nausea and vomiting.  Genitourinary: Negative for bladder incontinence, difficulty urinating, dysuria, frequency and hematuria.   Musculoskeletal: Negative for back pain, neck pain and neck stiffness.  Skin: Negative for itching and rash.  Neurological: Negative for dizziness, extremity weakness, headaches, light-headedness and seizures. Reports being off balance. Hematological: Negative for adenopathy. Does not bruise/bleed easily.  Psychiatric/Behavioral:  Negative for confusion, depression and sleep disturbance. The patient is not nervous/anxious.     PHYSICAL EXAMINATION:  Blood pressure (!) 113/49, pulse (!) 51, temperature (!) 97.5 F (36.4 C), temperature source Oral, resp. rate 18, height _0  (1.803 m), weight 162 lb 1.6 oz (73.5 kg), SpO2 98 %.  ECOG PERFORMANCE STATUS: 1 - Symptomatic but completely ambulatory  Physical Exam  Constitutional: Oriented to person, place, and time and well-developed, well-nourished, and in no distress. No distress.  HENT:  Head: Normocephalic and atraumatic.  Mouth/Throat: Oropharynx is clear and moist. No oropharyngeal exudate.  Eyes: Conjunctivae are normal. Right eye exhibits no discharge. Left eye exhibits no discharge. No scleral icterus.  Neck: Normal range of motion. Neck supple.  Cardiovascular: Normal rate, regular rhythm, normal heart sounds and intact distal pulses.   Pulmonary/Chest: Effort normal and breath sounds normal.  No respiratory distress. No wheezes. No rales.  Abdominal: Soft. Bowel sounds are normal. Exhibits no distension and no mass. There is no tenderness.  Musculoskeletal: Normal range of motion. Exhibits no edema.  Lymphadenopathy:    No cervical adenopathy.  Neurological: Alert and oriented to person, place, and time. Exhibits normal muscle tone. Coordination normal.  Skin: Skin is warm and dry. No rash noted. Not diaphoretic. No erythema. No pallor.  Psychiatric: Mood, memory and judgment normal.  Vitals reviewed.  LABORATORY DATA: Lab Results  Component Value Date   WBC 6.3 04/25/2017   HGB 11.1 (L) 04/25/2017   HCT 33.5 (L) 04/25/2017   MCV 105.3 (H) 04/25/2017   PLT 130 (L) 04/25/2017      Chemistry      Component Value Date/Time   NA 137 04/25/2017 1011   NA 138 02/08/2017 1118   K 4.5 04/25/2017 1011   K 4.5 02/08/2017 1118   CL 106 04/25/2017 1011   CO2 25 04/25/2017 1011   CO2 24 02/08/2017 1118   BUN 23 04/25/2017 1011   BUN 16.4 02/08/2017 1118   CREATININE 0.87 04/25/2017 1011   CREATININE 0.88 03/06/2017 1423   CREATININE 0.9 02/08/2017 1118      Component Value Date/Time   CALCIUM 9.2 04/25/2017 1011   CALCIUM 9.0 02/08/2017 1118   ALKPHOS 59 04/25/2017 1011   ALKPHOS 72 02/08/2017 1118   AST 21 04/25/2017 1011   AST 17 03/06/2017 1423   AST 19 02/08/2017 1118   ALT 16 04/25/2017 1011   ALT 14 03/06/2017 1423   ALT 15 02/08/2017 1118   BILITOT 0.4 04/25/2017 1011   BILITOT 0.5 03/06/2017 1423   BILITOT 0.53 02/08/2017 1118       RADIOGRAPHIC STUDIES:  Dg Chest 2 View  Result Date: 04/04/2017 CLINICAL DATA:  Increasing fatigue, shortness of breath, coughing, and weakness. Two thoracentesis ease since February 07, 2017. History of lung malignancy. EXAM: CHEST  2 VIEW COMPARISON:  Chest x-ray of March 09, 2017. FINDINGS: There is a small right and slightly larger left pleural effusion. These are more conspicuous than on the previous studies. There is  increased parenchymal density posteriorly in the left lower hemithorax. The heart and pulmonary vascularity are normal. There are post CABG changes. There is calcification in the wall of the aortic arch. The power port catheter tip projects over the midportion of the SVC. There is multilevel degenerative disc disease of the thoracic spine with mild wedge compression of T6 or T7. IMPRESSION: Interval reaccumulation of small amounts of pleural fluid greatest on the left. Increased density in the posterior aspect of the  left hemithorax likely reflects atelectasis or infiltrate. At minimum, followup PA and lateral chest X-ray is recommended in 3-4 weeks following trial of antibiotic therapy to ensure resolution and exclude underlying malignancy. Follow-up sooner is available upon request. Thoracic aortic atherosclerosis. Electronically Signed   By: David  Martinique M.D.   On: 04/04/2017 12:50     ASSESSMENT/PLAN:  Stage IV squamous cell carcinoma of left lung [facility]) This is a very pleasant 84 year old white male with metastatic non-small cell lung cancer, squamous cell carcinoma with negative PDL 1 expression and enlarging solitary brain metastasis.  The patient completed palliative radiotherapy to the brain metastasis. Hecompletedsystemic chemotherapy with carboplatin, paclitaxel and Keytruda status post 4cycles. He has no evidence for disease progression with this treatment. He is now on maintenance treatment with Keytruda.  Status post 2 cycles which he tolerated well. Recommend that he proceed with cycle 3 as scheduled today.  The patient will have a restaging CT scan of the chest, abdomen, pelvis prior to his next visit.  The patient will follow-up in 3 weeks for evaluation prior to cycle 4 of his maintenance Keytruda and to review his restaging CT scan results.  For his shortness of breath with exertion, I suspect this is related to his deconditioning.  We discussed a referral to neuro  rehabilitation to help with him being off balance, help with his deconditioning, and for speech therapy evaluation.  He was reminded to drink thickened liquids as previously recommended unless new recommendations are made.  Patient states understanding of the above instructions.  The patient was advised to call immediately if he has any concerning symptoms in the interval. The patient voices understanding of current disease status and treatment options and is in agreement with the current care plan. All questions were answered. The patient knows to call the clinic with any problems, questions or concerns. We can certainly see the patient much sooner if necessary.  Orders Placed This Encounter  Procedures  . CT CHEST W CONTRAST    Standing Status:   Future    Standing Expiration Date:   04/26/2018    Order Specific Question:   If indicated for the ordered procedure, I authorize the administration of contrast media per Radiology protocol    Answer:   Yes    Order Specific Question:   Preferred imaging location?    Answer:   [facility]    Order Specific Question:   Radiology Contrast Protocol - do NOT remove file path    Answer:   \\[email or web address]    Order Specific Question:   Reason for Exam additional comments    Answer:   Stage IV lung cancer. Restaging.  Marland Kitchen CT ABDOMEN PELVIS W CONTRAST    Standing Status:   Future    Standing Expiration Date:   04/26/2018    Order Specific Question:   If indicated for the ordered procedure, I authorize the administration of contrast media per Radiology protocol    Answer:   Yes    Order Specific Question:   Preferred imaging location?    Answer:   [facility]    Order Specific Question:   Radiology Contrast Protocol - do NOT remove file path    Answer:   \\[email or web address]    Order Specific Question:   Reason for Exam additional comments    Answer:   Stage IV lung cancer. Restaging.   Marland Kitchen Referral to Neuro Rehab    Referral Priority:   Routine  Referral Type:   Consultation    Number of Visits Requested:   [place], [place], AGPCNP-BC, AOCNP 04/25/17

## 2017-04-25 NOTE — Patient Instructions (Signed)
[facility] Discharge Instructions for Patients Receiving Chemotherapy  Today you received the following chemotherapy agents [name]  To help prevent nausea and vomiting after your treatment, we encourage you to take your nausea medication as directed If you develop nausea and vomiting that is not controlled by your nausea medication, call the clinic.   BELOW ARE SYMPTOMS THAT SHOULD BE REPORTED IMMEDIATELY:  *FEVER GREATER THAN 100.5 F  *CHILLS WITH OR WITHOUT FEVER  NAUSEA AND VOMITING THAT IS NOT CONTROLLED WITH YOUR NAUSEA MEDICATION  *UNUSUAL SHORTNESS OF BREATH  *UNUSUAL BRUISING OR BLEEDING  TENDERNESS IN MOUTH AND THROAT WITH OR WITHOUT PRESENCE OF ULCERS  *URINARY PROBLEMS  *BOWEL PROBLEMS  UNUSUAL RASH Items with * indicate a potential emergency and should be followed up as soon as possible.  Feel free to call the clinic should you have any questions or concerns. The clinic phone number is [phone].  Please show the [facility] at check-in to the Emergency Department and triage nurse.

## 2017-05-10 ENCOUNTER — Ambulatory Visit (HOSPITAL_COMMUNITY)
Admission: RE | Admit: 2017-05-10 | Discharge: 2017-05-10 | Disposition: A | Discharge status: Home / Self Care | Payer: Medicare Other | Source: Ambulatory Visit | Attending: Oncology | Admitting: Oncology

## 2017-05-10 DIAGNOSIS — R590 Localized enlarged lymph nodes: Secondary | ICD-10-CM | POA: Insufficient documentation

## 2017-05-10 DIAGNOSIS — J9811 Atelectasis: Secondary | ICD-10-CM | POA: Diagnosis not present

## 2017-05-10 DIAGNOSIS — I2510 Atherosclerotic heart disease of native coronary artery without angina pectoris: Secondary | ICD-10-CM | POA: Diagnosis not present

## 2017-05-10 DIAGNOSIS — J432 Centrilobular emphysema: Secondary | ICD-10-CM | POA: Diagnosis not present

## 2017-05-10 DIAGNOSIS — N261 Atrophy of kidney (terminal): Secondary | ICD-10-CM | POA: Insufficient documentation

## 2017-05-10 DIAGNOSIS — Z923 Personal history of irradiation: Secondary | ICD-10-CM | POA: Diagnosis not present

## 2017-05-10 DIAGNOSIS — I700 Atherosclerosis of aorta: Secondary | ICD-10-CM | POA: Insufficient documentation

## 2017-05-10 DIAGNOSIS — J90 Pleural effusion, not elsewhere classified: Secondary | ICD-10-CM | POA: Insufficient documentation

## 2017-05-10 DIAGNOSIS — Z951 Presence of aortocoronary bypass graft: Secondary | ICD-10-CM | POA: Insufficient documentation

## 2017-05-10 DIAGNOSIS — M4854XA Collapsed vertebra, not elsewhere classified, thoracic region, initial encounter for fracture: Secondary | ICD-10-CM | POA: Insufficient documentation

## 2017-05-10 DIAGNOSIS — C3492 Malignant neoplasm of unspecified part of left bronchus or lung: Secondary | ICD-10-CM | POA: Diagnosis not present

## 2017-05-10 MED ORDER — IOPAMIDOL (ISOVUE-300) INJECTION 61%
100.0000 mL | Freq: Once | INTRAVENOUS | Status: AC | PRN
  Administered 2017-05-10: 100 mL via INTRAVENOUS

## 2017-05-10 MED ORDER — IOPAMIDOL (ISOVUE-300) INJECTION 61%
INTRAVENOUS | Status: AC
  Filled 2017-05-10: qty 100

## 2017-05-12 NOTE — Progress Notes (Signed)
Steve Frank 84 y.o.gentlemanwith Stage IV non-small cell carcinoma of the left lower lung with solitary brain metastasis completed radiation 01-10-17, review 05-18-17 FU.  Headache:No Pain:No Dizziness:When he stands from sitting or after lying down using a walker Nausea/vomiting:No Ringing in ears:No Visual changes (Blurred/ diplopia double vision,blind spots, and peripheral vsion changes):No visual change Fatigue:Most of the time Cognitive changes:Alert and oriented x 3, forgetful at times. Weight changes, if XFG:HWEX 7 pounds Wt Readings from Last 3 Encounters:  05/23/17 154 lb 9.6 oz (70.1 kg)  05/16/17 161 lb 9.6 oz (73.3 kg)  04/25/17 162 lb 1.6 oz (73.5 kg)   Respiratory complaints, if any: SOB with exertion, coughing clear to pale yellow sputum Hemoptysis, if any: No  Swallowing Problems/Pain/Difficulty swallowing:Yes will not [place] the thicker Appetite :Fair  03-14-17 Saw Dr. Julien Nordmann Maintenance treatment with single agent Keytruda 200 mg IV every 3 weeks.  First cycle March 14, 2017 BP 120/62 (BP Location: Right Arm, Patient Position: Sitting, Cuff Size: Normal)   Pulse (!) 53   Temp 97.7 F (36.5 C) (Oral)   Resp 20   Ht 5\' 11"  (1.803 m)   Wt 154 lb 9.6 oz (70.1 kg)   SpO2 95%   BMI 21.56 kg/m

## 2017-05-15 ENCOUNTER — Other Ambulatory Visit: Payer: Self-pay | Admitting: Medical Oncology

## 2017-05-15 DIAGNOSIS — C3492 Malignant neoplasm of unspecified part of left bronchus or lung: Secondary | ICD-10-CM

## 2017-05-16 ENCOUNTER — Inpatient Hospital Stay: Payer: Medicare Other

## 2017-05-16 ENCOUNTER — Encounter: Payer: Self-pay | Admitting: Oncology

## 2017-05-16 ENCOUNTER — Inpatient Hospital Stay (HOSPITAL_BASED_OUTPATIENT_CLINIC_OR_DEPARTMENT_OTHER): Payer: Medicare Other | Admitting: Oncology

## 2017-05-16 ENCOUNTER — Telehealth: Payer: Self-pay | Admitting: Oncology

## 2017-05-16 ENCOUNTER — Inpatient Hospital Stay: Payer: Medicare Other | Attending: Internal Medicine

## 2017-05-16 VITALS — BP 118/53 | HR 52 | Temp 97.6°F | Resp 18 | Ht 71.0 in | Wt 161.6 lb

## 2017-05-16 DIAGNOSIS — Z5112 Encounter for antineoplastic immunotherapy: Secondary | ICD-10-CM

## 2017-05-16 DIAGNOSIS — E119 Type 2 diabetes mellitus without complications: Secondary | ICD-10-CM

## 2017-05-16 DIAGNOSIS — C3492 Malignant neoplasm of unspecified part of left bronchus or lung: Secondary | ICD-10-CM | POA: Insufficient documentation

## 2017-05-16 DIAGNOSIS — Z79899 Other long term (current) drug therapy: Secondary | ICD-10-CM | POA: Diagnosis not present

## 2017-05-16 DIAGNOSIS — R5383 Other fatigue: Secondary | ICD-10-CM | POA: Diagnosis not present

## 2017-05-16 DIAGNOSIS — C7931 Secondary malignant neoplasm of brain: Secondary | ICD-10-CM | POA: Diagnosis not present

## 2017-05-16 DIAGNOSIS — R5382 Chronic fatigue, unspecified: Secondary | ICD-10-CM

## 2017-05-16 DIAGNOSIS — Z95828 Presence of other vascular implants and grafts: Secondary | ICD-10-CM

## 2017-05-16 DIAGNOSIS — I10 Essential (primary) hypertension: Secondary | ICD-10-CM

## 2017-05-16 LAB — CMP (CANCER CENTER ONLY)
ALK PHOS: 57 U/L (ref 40–150)
ALT: 13 U/L (ref 0–55)
AST: 18 U/L (ref 5–34)
Albumin: 2.9 g/dL — ABNORMAL LOW (ref 3.5–5.0)
Anion gap: 6 (ref 3–11)
BILIRUBIN TOTAL: 0.4 mg/dL (ref 0.2–1.2)
BUN: 22 mg/dL (ref 7–26)
CALCIUM: 9 mg/dL (ref 8.4–10.4)
CO2: 25 mmol/L (ref 22–29)
CREATININE: 0.83 mg/dL (ref 0.70–1.30)
Chloride: 107 mmol/L (ref 98–109)
GFR, Est AFR Am: >60 mL/min (ref >60)
GFR, Estimated: >60 mL/min (ref >60)
GLUCOSE: 200 mg/dL — AB (ref 70–140)
Potassium: 4 mmol/L (ref 3.5–5.1)
SODIUM: 138 mmol/L (ref 136–145)
TOTAL PROTEIN: 6 g/dL — AB (ref 6.4–8.3)

## 2017-05-16 LAB — CBC WITH DIFFERENTIAL (CANCER CENTER ONLY)
Basophils Absolute: 0 10*3/uL (ref 0.0–0.1)
Basophils Relative: 1 %
EOS ABS: 0.5 10*3/uL (ref 0.0–0.5)
Eosinophils Relative: 9 %
HEMATOCRIT: 34.3 % — AB (ref 38.4–49.9)
HEMOGLOBIN: 11.4 g/dL — AB (ref 13.0–17.1)
LYMPHS PCT: 7 %
Lymphs Abs: 0.4 10*3/uL — ABNORMAL LOW (ref 0.9–3.3)
MCH: 34.2 pg — ABNORMAL HIGH (ref 27.2–33.4)
MCHC: 33.4 g/dL (ref 32.0–36.0)
MCV: 102.3 fL — ABNORMAL HIGH (ref 79.3–98.0)
MONOS PCT: 8 %
Monocytes Absolute: 0.5 10*3/uL (ref 0.1–0.9)
NEUTROS ABS: 4.7 10*3/uL (ref 1.5–6.5)
NEUTROS PCT: 75 %
Platelet Count: 121 10*3/uL — ABNORMAL LOW (ref 140–400)
RBC: 3.35 MIL/uL — AB (ref 4.20–5.82)
RDW: 14.3 % (ref 11.0–14.6)
WBC: 6.3 10*3/uL (ref 4.0–10.3)

## 2017-05-16 LAB — TSH: TSH: 0.919 u[IU]/mL (ref 0.320–4.118)

## 2017-05-16 MED ORDER — SODIUM CHLORIDE 0.9 % IV SOLN
Freq: Once | INTRAVENOUS | Status: AC
  Administered 2017-05-16: 12:00:00 via INTRAVENOUS

## 2017-05-16 MED ORDER — SODIUM CHLORIDE 0.9 % IV SOLN
200.0000 mg | Freq: Once | INTRAVENOUS | Status: AC
  Administered 2017-05-16: 200 mg via INTRAVENOUS
  Filled 2017-05-16: qty 8

## 2017-05-16 MED ORDER — HEPARIN SOD (PORK) LOCK FLUSH 100 UNIT/ML IV SOLN
500.0000 [IU] | Freq: Once | INTRAVENOUS | Status: AC | PRN
  Administered 2017-05-16: 500 [IU]
  Filled 2017-05-16: qty 5

## 2017-05-16 MED ORDER — SODIUM CHLORIDE 0.9% FLUSH
10.0000 mL | INTRAVENOUS | Status: DC | PRN
  Administered 2017-05-16: 10 mL
  Filled 2017-05-16: qty 10

## 2017-05-16 MED ORDER — SODIUM CHLORIDE 0.9% FLUSH
10.0000 mL | Freq: Once | INTRAVENOUS | Status: AC
  Administered 2017-05-16: 10 mL
  Filled 2017-05-16: qty 10

## 2017-05-16 NOTE — Progress Notes (Signed)
[facility] OFFICE PROGRESS NOTE  [name], MD [address] [place] [address]  DIAGNOSIS: Stage IV (T1b, N3, M1c) non-small cell lung cancer, squamous cell carcinoma diagnosed in September 2018 presented with left infrahilar mass in addition to mediastinal and supraclavicular lymphadenopathy as well as metastatic brain lesion.  PDL1 expression 0%.  PRIOR THERAPY: 1)Palliative radiotherapy to the brain metastasis. 2)Systemic chemotherapy with carboplatin for AUC of 5, paclitaxel 175 MG/M2 and Ketruda (pembrolizumab) 200 MG IV every 3 weeks. First dose 11/21/2016. Status post 4cycles.  CURRENT THERAPY: Maintenance treatment with single agent Keytruda 200 mg IV every 3 weeks. First cycle March 14, 2017.Status post 3 cycles.  INTERVAL HISTORY: Steve Frank 84 y.o. male returns for routine follow-up visit accompanied by his wife.  The patient is feeling fine today with exception of ongoing dyspnea on exertion.  The patient reports that he has not been drinking thickened liquids as previously directed and does not plan to start.  The patient denies fevers and chills.  Denies chest pain, shortness breath at rest, hemoptysis.  He does have a nonproductive cough.  Denies nausea, vomiting, constipation, diarrhea.  Denies recent weight loss or night sweats.  The patient is here for evaluation prior to cycle 4 of his treatment and to review his restaging CT scan results.  MEDICAL HISTORY: Past Medical History:  Diagnosis Date  . Arthritis   . BPH (benign prostatic hyperplasia)   . CAD (coronary artery disease)    s/p 2 cabg's  . Cancer (HCC)    Basal and squamous cell  skin cancer  . Cerebellar mass   . Chest mass   . Diabetes ([place])    Type II  . GERD (gastroesophageal reflux disease)   . Head injury with loss of consciousness [facility])    age 36 or 47-   . Heart attack ([place])   . HLD (hyperlipidemia)   . Hypertension   . Macular degeneration   .  Pneumonia     x 2 last 1071  . Stage IV squamous cell carcinoma of left lung ([place]) 10/20/2016    ALLERGIES:  is allergic to ciprofloxacin.  MEDICATIONS:  Current Outpatient Medications  Medication Sig Dispense Refill  . acetaminophen (TYLENOL) 325 MG tablet Take 650 mg by mouth every 6 (six) hours as needed (for pain/headaches.).     Marland Kitchen albuterol (PROVENTIL HFA;VENTOLIN HFA) 108 (90 Base) MCG/ACT inhaler Inhale 2 puffs into the lungs every 6 (six) hours as needed (for wheezing/shortness of breath).     . B-D INS SYR ULTRAFINE 1CC/31G 31G X 5/16" 1 ML MISC     . Cholecalciferol (VITAMIN D-1000 MAX ST) 1000 units tablet Take 1,000 Units by mouth daily.     . clotrimazole-betamethasone (LOTRISONE) cream Apply 1 application topically 2 (two) times daily.    Marland Kitchen docusate sodium (COLACE) 100 MG capsule Take 100 mg by mouth 2 (two) times daily as needed (for constipation.).    Marland Kitchen gabapentin (NEURONTIN) 400 MG capsule Take 400 mg by mouth at bedtime.    Marland Kitchen guaiFENesin-dextromethorphan (ROBITUSSIN DM) 100-10 MG/5ML syrup Take 15 mLs every 4 (four) hours as needed by mouth for cough.    Marland Kitchen HUMALOG 100 UNIT/ML injection INJECT 2 TO 0.12MLS (2-8 UNITS) UNDER THE SKIN 3 TIMES A DAY BEFORE MEALS, AS PER SLIDING SCALE  0  . LANTUS 100 UNIT/ML injection INJECT 0.15ML (15 UNITS TOTAL) UNDER THE SKIN DAILY AT BEDTIME.  0  . lidocaine-prilocaine (EMLA) cream Apply 1 application  topically as needed. 30 g 0  . magnesium hydroxide (MILK OF MAGNESIA) 400 MG/5ML suspension Take 30 mLs by mouth daily as needed (for constipation.).    Marland Kitchen mirtazapine (REMERON) 15 MG tablet Take 15 mg by mouth at bedtime.  0  . Multiple Vitamin (MULTIVITAMIN WITH MINERALS) TABS tablet Take 1 tablet by mouth daily.    Marland Kitchen neomycin-bacitracin-polymyxin (NEOSPORIN) [phone] ointment Apply 1 application topically at bedtime.    Marland Kitchen omeprazole (PRILOSEC) 20 MG capsule Take 20 mg by mouth at bedtime.     . polyethylene glycol (MIRALAX / GLYCOLAX)  packet Take 17 g by mouth daily as needed (for constipation.).     Marland Kitchen prochlorperazine (COMPAZINE) 10 MG tablet Take 1 tablet (10 mg total) by mouth every 6 (six) hours as needed for nausea or vomiting. 30 tablet 0  . rosuvastatin (CRESTOR) 20 MG tablet Take 20 mg by mouth daily.    Marland Kitchen triamcinolone (KENALOG) 0.025 % ointment Apply 1 application 2 (two) times daily topically. 30 g 1  . tamsulosin (FLOMAX) 0.4 MG CAPS capsule Take 1 capsule by mouth once.     No current facility-administered medications for this visit.    Facility-Administered Medications Ordered in Other Visits  Medication Dose Route Frequency Provider Last Rate Last Dose  . heparin lock flush 100 unit/mL  500 Units Intracatheter Once PRN Curt Bears, MD      . pembrolizumab [facility]) 200 mg in sodium chloride 0.9 % 50 mL chemo infusion  200 mg Intravenous Once Curt Bears, MD 116 mL/hr at 05/16/17 1210 200 mg at 05/16/17 1210  . sodium chloride flush (NS) 0.9 % injection 10 mL  10 mL Intracatheter PRN Curt Bears, MD        SURGICAL HISTORY:  Past Surgical History:  Procedure Laterality Date  . CARDIAC CATHETERIZATION    . COLONOSCOPY W/ POLYPECTOMY    . CORONARY ARTERY BYPASS GRAFT     Dr Prescott [facility] 07/2007  . CORONARY ARTERY BYPASS GRAFT     1st one was @ [facility] 26years ago 12/1991  . CRANIECTOMY FOR EXCISION OF BRAIN TUMOR INFRATENTORIAL / POSTERIOR FOSSA  08/05/2016   Dr Rebecka Apley  @ [facility]/Neurosurgery  . FLEXIBLE BRONCHOSCOPY  08/07/2016   Dr Gwenevere Ghazi  . IR FLUORO GUIDE PORT INSERTION RIGHT  11/18/2016  . IR THORACENTESIS ASP PLEURAL SPACE W/IMG GUIDE  03/09/2017  . IR US GUIDE VASC ACCESS RIGHT  11/18/2016  . LUNG BIOPSY N/A 10/14/2016   Procedure: LUNG BIOPSY;  Surgeon: Grace Isaac, MD;  Location: [facility];  Service: Thoracic;  Laterality: N/A;  . VIDEO BRONCHOSCOPY WITH ENDOBRONCHIAL ULTRASOUND N/A 10/14/2016   Procedure: VIDEO BRONCHOSCOPY WITH ENDOBRONCHIAL ULTRASOUND;   Surgeon: Grace Isaac, MD;  Location: [place];  Service: Thoracic;  Laterality: N/A;    REVIEW OF SYSTEMS:   Review of Systems  Constitutional: Negative for appetite change, chills, fatigue, fever and unexpected weight change.  HENT:   Negative for mouth sores, nosebleeds, sore throat and trouble swallowing.   Eyes: Negative for eye problems and icterus.  Respiratory: Negative for hemoptysis, shortness of breath at rest and wheezing.  Positive for cough and shortness of breath with exertion.  Cardiovascular: Negative for chest pain and leg swelling.  Gastrointestinal: Negative for abdominal pain, constipation, diarrhea, nausea and vomiting.  Genitourinary: Negative for bladder incontinence, difficulty urinating, dysuria, frequency and hematuria.   Musculoskeletal: Negative for back pain, neck pain and neck stiffness.  Skin: Negative for itching and rash.  Neurological: Negative for dizziness, extremity weakness, headaches, light-headedness and seizures.  Hematological: Negative for adenopathy. Does not bruise/bleed easily.  Psychiatric/Behavioral: Negative for confusion, depression and sleep disturbance. The patient is not nervous/anxious.     PHYSICAL EXAMINATION:  Blood pressure (!) 118/53, pulse (!) 52, temperature 97.6 F (36.4 C), temperature source Oral, resp. rate 18, height _0  (1.803 m), weight 161 lb 9.6 oz (73.3 kg), SpO2 96 %.  ECOG PERFORMANCE STATUS: 1 - Symptomatic but completely ambulatory  Physical Exam  Constitutional: Oriented to person, place, and time and well-developed, well-nourished, and in no distress. No distress.  HENT:  Head: Normocephalic and atraumatic.  Mouth/Throat: Oropharynx is clear and moist. No oropharyngeal exudate.  Eyes: Conjunctivae are normal. Right eye exhibits no discharge. Left eye exhibits no discharge. No scleral icterus.  Neck: Normal range of motion. Neck supple.  Cardiovascular: Normal rate, regular rhythm, normal heart sounds  and intact distal pulses.   Pulmonary/Chest: Effort normal and breath sounds normal. No respiratory distress. No wheezes. No rales.  Abdominal: Soft. Bowel sounds are normal. Exhibits no distension and no mass. There is no tenderness.  Musculoskeletal: Normal range of motion. Exhibits no edema.  Lymphadenopathy:    No cervical adenopathy.  Neurological: Alert and oriented to person, place, and time. Exhibits normal muscle tone. Coordination normal.  Skin: Skin is warm and dry. No rash noted. Not diaphoretic. No erythema. No pallor.  Psychiatric: Mood, memory and judgment normal.  Vitals reviewed.  LABORATORY DATA: Lab Results  Component Value Date   WBC 6.3 05/16/2017   HGB 11.1 (L) 04/25/2017   HCT 34.3 (L) 05/16/2017   MCV 102.3 (H) 05/16/2017   PLT 121 (L) 05/16/2017      Chemistry      Component Value Date/Time   NA 138 05/16/2017 0953   NA 138 02/08/2017 1118   K 4.0 05/16/2017 0953   K 4.5 02/08/2017 1118   CL 107 05/16/2017 0953   CO2 25 05/16/2017 0953   CO2 24 02/08/2017 1118   BUN 22 05/16/2017 0953   BUN 16.4 02/08/2017 1118   CREATININE 0.83 05/16/2017 0953   CREATININE 0.9 02/08/2017 1118      Component Value Date/Time   CALCIUM 9.0 05/16/2017 0953   CALCIUM 9.0 02/08/2017 1118   ALKPHOS 57 05/16/2017 0953   ALKPHOS 72 02/08/2017 1118   AST 18 05/16/2017 0953   AST 19 02/08/2017 1118   ALT 13 05/16/2017 0953   ALT 15 02/08/2017 1118   BILITOT 0.4 05/16/2017 0953   BILITOT 0.53 02/08/2017 1118       RADIOGRAPHIC STUDIES:  Ct Chest W Contrast  Result Date: 05/10/2017 CLINICAL DATA:  Metastatic lung cancer restaging EXAM: CT CHEST, ABDOMEN, AND PELVIS WITH CONTRAST TECHNIQUE: Multidetector CT imaging of the chest, abdomen and pelvis was performed following the standard protocol during bolus administration of intravenous contrast. CONTRAST:  143m ISOVUE-300 IOPAMIDOL (ISOVUE-300) INJECTION 61% COMPARISON:  Multiple exams, including 02/17/2017 FINDINGS:  CT CHEST FINDINGS Cardiovascular: Coronary, aortic arch, and branch vessel atherosclerotic vascular disease. Prior CABG noted. Collateral venous structures noted along the left upper extremity and axilla. Mediastinum/Nodes: Right hilar lymph node 1.1 cm in short axis, formerly 0.8 cm. Right eccentric subcarinal lymph node 1.4 cm in short axis, previously the same. Left infrahilar lymph node 0.8 cm in short axis, previously the same by my measurements. Contrast medium is present in the esophagus. Lungs/Pleura: Centrilobular emphysema. Moderate to large left and moderate right pleural effusions with associated passive atelectasis including  most of the left lower lobe. The pleural effusions are nonspecific for transudative or exudative etiology; no pleural mass is identified. Musculoskeletal: Multilevel bridging spurring in the cervical spine. Evolutionary findings in the oblique vertebral body compression fracture at T7 and inferior endplate compression fracture at T6, without significant progression of the degree of compression. No appreciable subluxation. There is some bridging spurring at most thoracic spine levels. Thoracic kyphosis is present. There 2-3 mm of posterior bony retropulsion at T6 and T7. CT ABDOMEN PELVIS FINDINGS Hepatobiliary: Mildly contracted gallbladder. No biliary dilatation. Otherwise unremarkable. Pancreas: Unremarkable Spleen: Unremarkable Adrenals/Urinary Tract: Bilateral renal atrophy. A 1.0 by 0.8 cm hypodense lesion of the right mid kidney is stable and likely a cyst although technically nonspecific due to small size and motion artifact. Stomach/Bowel: Prominent stool throughout the colon favors constipation. Accentuated wall thickening in the rectum on image 113/2 not seen on the prior exam, probably incidental. Vascular/Lymphatic: Aortoiliac atherosclerotic vascular disease. There is some venous collaterals from the left axillary region which track down along the left anterior  abdominal wall to the left common iliac vein. No pathologic adenopathy identified. Reproductive: Unremarkable Other: No supplemental non-categorized findings. Musculoskeletal: Degenerative arthropathy of both hips. Lumbar spondylosis with bridging spurring and transitional S1 vertebra noted. There is mild bilateral foraminal stenosis at L5-S1 due to spondylosis and degenerative disc disease. IMPRESSION: 1. Mostly stable appearance of the chest although the right hilar lymph node is now mildly enlarged at 1.1 cm, previously 0.8 cm. Stable mildly enlarged right eccentric subcarinal lymph node and small left infrahilar lymph node. The left infrahilar mass is not readily apparent. Much of the left lower lobe is atelectatic likely due to a combination of passive atelectasis associated with the left pleural effusion, and prior radiation therapy. 2. No new metastatic lesions are identified in the chest, abdomen, or pelvis. 3. Evolutionary findings in the compression fractures at T6 and T7. 4. Other imaging findings of potential clinical significance: Aortic Atherosclerosis (ICD10-I70.0) and Emphysema (ICD10-J43.9). Coronary atherosclerosis with prior CABG. Venous collateralization from the left upper extremity, tracking down as far as the left external iliac vein. Esophageal contrast suggesting reflux or dysmotility. Moderate to large left and moderate right pleural effusions with associated passive atelectasis. Bilateral renal atrophy. Prominent stool throughout the colon favors constipation. Mild bilateral foraminal impingement at L5-S1 (assumes transitional S1 vertebral numbering). Electronically Signed   By: Van Clines M.D.   On: 05/10/2017 13:59   Ct Abdomen Pelvis W Contrast  Result Date: 05/10/2017 CLINICAL DATA:  Metastatic lung cancer restaging EXAM: CT CHEST, ABDOMEN, AND PELVIS WITH CONTRAST TECHNIQUE: Multidetector CT imaging of the chest, abdomen and pelvis was performed following the standard  protocol during bolus administration of intravenous contrast. CONTRAST:  113m ISOVUE-300 IOPAMIDOL (ISOVUE-300) INJECTION 61% COMPARISON:  Multiple exams, including 02/17/2017 FINDINGS: CT CHEST FINDINGS Cardiovascular: Coronary, aortic arch, and branch vessel atherosclerotic vascular disease. Prior CABG noted. Collateral venous structures noted along the left upper extremity and axilla. Mediastinum/Nodes: Right hilar lymph node 1.1 cm in short axis, formerly 0.8 cm. Right eccentric subcarinal lymph node 1.4 cm in short axis, previously the same. Left infrahilar lymph node 0.8 cm in short axis, previously the same by my measurements. Contrast medium is present in the esophagus. Lungs/Pleura: Centrilobular emphysema. Moderate to large left and moderate right pleural effusions with associated passive atelectasis including most of the left lower lobe. The pleural effusions are nonspecific for transudative or exudative etiology; no pleural mass is identified. Musculoskeletal: Multilevel bridging spurring in the  cervical spine. Evolutionary findings in the oblique vertebral body compression fracture at T7 and inferior endplate compression fracture at T6, without significant progression of the degree of compression. No appreciable subluxation. There is some bridging spurring at most thoracic spine levels. Thoracic kyphosis is present. There 2-3 mm of posterior bony retropulsion at T6 and T7. CT ABDOMEN PELVIS FINDINGS Hepatobiliary: Mildly contracted gallbladder. No biliary dilatation. Otherwise unremarkable. Pancreas: Unremarkable Spleen: Unremarkable Adrenals/Urinary Tract: Bilateral renal atrophy. A 1.0 by 0.8 cm hypodense lesion of the right mid kidney is stable and likely a cyst although technically nonspecific due to small size and motion artifact. Stomach/Bowel: Prominent stool throughout the colon favors constipation. Accentuated wall thickening in the rectum on image 113/2 not seen on the prior exam, probably  incidental. Vascular/Lymphatic: Aortoiliac atherosclerotic vascular disease. There is some venous collaterals from the left axillary region which track down along the left anterior abdominal wall to the left common iliac vein. No pathologic adenopathy identified. Reproductive: Unremarkable Other: No supplemental non-categorized findings. Musculoskeletal: Degenerative arthropathy of both hips. Lumbar spondylosis with bridging spurring and transitional S1 vertebra noted. There is mild bilateral foraminal stenosis at L5-S1 due to spondylosis and degenerative disc disease. IMPRESSION: 1. Mostly stable appearance of the chest although the right hilar lymph node is now mildly enlarged at 1.1 cm, previously 0.8 cm. Stable mildly enlarged right eccentric subcarinal lymph node and small left infrahilar lymph node. The left infrahilar mass is not readily apparent. Much of the left lower lobe is atelectatic likely due to a combination of passive atelectasis associated with the left pleural effusion, and prior radiation therapy. 2. No new metastatic lesions are identified in the chest, abdomen, or pelvis. 3. Evolutionary findings in the compression fractures at T6 and T7. 4. Other imaging findings of potential clinical significance: Aortic Atherosclerosis (ICD10-I70.0) and Emphysema (ICD10-J43.9). Coronary atherosclerosis with prior CABG. Venous collateralization from the left upper extremity, tracking down as far as the left external iliac vein. Esophageal contrast suggesting reflux or dysmotility. Moderate to large left and moderate right pleural effusions with associated passive atelectasis. Bilateral renal atrophy. Prominent stool throughout the colon favors constipation. Mild bilateral foraminal impingement at L5-S1 (assumes transitional S1 vertebral numbering). Electronically Signed   By: Van Clines M.D.   On: 05/10/2017 13:59     ASSESSMENT/PLAN:  Stage IV squamous cell carcinoma of left lung [facility]) This is  a very pleasant 84 year old white male with metastatic non-small cell lung cancer, squamous cell carcinoma with negative PDL 1 expression and enlarging solitary brain metastasis.  The patient completed palliative radiotherapy to the brain metastasis. Hecompletedsystemic chemotherapy with carboplatin, paclitaxel and Keytruda status post 4cycles. He has no evidence for disease progression with this treatment. He is now onmaintenance treatment with Keytruda.Status post 3 cycles which  is tolerating well without any concerning complaints. He had a recent restaging CT scan of the chest, abdomen, pelvis and is here to discuss results.  The patient was seen with Dr. Julien Nordmann.  CT scan results were discussed with the patient and his wife which overall shows stable disease.  There is a slight increase in the right hilar lymph node, but the increase in size is not enough to change his treatment.  Recommend that he proceed with cycle 4 of maintenance Keytruda today as scheduled.  The patient will follow-up in 3 weeks for evaluation prior to cycle 5 of his maintenance Keytruda.  The patient was advised to call immediately if he has any concerning symptoms in the interval.  The patient voices understanding of current disease status and treatment options and is in agreement with the current care plan. All questions were answered. The patient knows to call the clinic with any problems, questions or concerns. We can certainly see the patient much sooner if necessary.   No orders of the defined types were placed in this encounter.  Mikey Bussing, DNP, AGPCNP-BC, AOCNP 05/16/17  ADDENDUM: Hematology/Oncology Attending: I had a face-to-face encounter with the patient.  I recommended his care plan.  This is a very pleasant 84 years old white male with metastatic non-small cell lung cancer, squamous cell carcinoma status post palliative radiotherapy to the brain followed by induction systemic chemotherapy  with carboplatin, paclitaxel and Keytruda for 4 cycles with no concerning findings of disease progression.  The patient was a started on maintenance treatment with single agent Keytruda status post 3 cycles.  He has been tolerating this treatment fairly well with no concerning complaints except for mild fatigue. He had repeat CT scan of the chest, abdomen and pelvis performed recently.  I personally and independently reviewed the scans and discussed the results with the patient and his wife.  His a scan showed a stable disease except for a slight increase in the right hilar lymph nodes.  I recommended for the patient to continue his current treatment with [facility] for now.  We will continue to monitor the enlarged right hilar lymph node closely on upcoming imaging studies. The patient will proceed with cycle #4 of his treatment with Keytruda today. He will come back for follow-up visit in 4 weeks for evaluation before starting cycle #5. The patient was advised to call immediately if he has any concerning symptoms in the interval.  Disclaimer: This note was dictated with voice recognition software. Similar sounding words can inadvertently be transcribed and may be missed upon review. Eilleen Kempf, MD 05/16/17

## 2017-05-16 NOTE — Telephone Encounter (Signed)
 -   appts already scheduled per 4/9 los - no additional appts to add per treatment plan.

## 2017-05-16 NOTE — Patient Instructions (Signed)
[facility] Discharge Instructions for Patients Receiving Chemotherapy  Today you received the following chemotherapy agents:  Keytruda.  To help prevent nausea and vomiting after your treatment, we encourage you to take your nausea medication as directed.   If you develop nausea and vomiting that is not controlled by your nausea medication, call the clinic.   BELOW ARE SYMPTOMS THAT SHOULD BE REPORTED IMMEDIATELY:  *FEVER GREATER THAN 100.5 F  *CHILLS WITH OR WITHOUT FEVER  NAUSEA AND VOMITING THAT IS NOT CONTROLLED WITH YOUR NAUSEA MEDICATION  *UNUSUAL SHORTNESS OF BREATH  *UNUSUAL BRUISING OR BLEEDING  TENDERNESS IN MOUTH AND THROAT WITH OR WITHOUT PRESENCE OF ULCERS  *URINARY PROBLEMS  *BOWEL PROBLEMS  UNUSUAL RASH Items with * indicate a potential emergency and should be followed up as soon as possible.  Feel free to call the clinic should you have any questions or concerns. The clinic phone number is [phone].  Please show the CHEMO ALERT CARD at check-in to the Emergency Department and triage nurse.    

## 2017-05-16 NOTE — Assessment & Plan Note (Signed)
This is a very pleasant 84 year old white male with metastatic non-small cell lung cancer, squamous cell carcinoma with negative PDL 1 expression and enlarging solitary brain metastasis.  The patient completed palliative radiotherapy to the brain metastasis. Hecompletedsystemic chemotherapy with carboplatin, paclitaxel and Keytruda status post 4cycles. He has no evidence for disease progression with this treatment. He is now onmaintenance treatment with Keytruda.Status post 3 cycles which is tolerating well without any concerning complaints. He had a recent restaging CT scan of the chest, abdomen, pelvis and is here to discuss results.  The patient was seen with Dr. Julien Nordmann.  CT scan results were discussed with the patient and his wife which overall shows stable disease.  There is a slight increase in the right hilar lymph node, but the increase in size is not enough to change his treatment.  Recommend that he proceed with cycle 4 of maintenance Keytruda today as scheduled.  The patient will follow-up in 3 weeks for evaluation prior to cycle 5 of his maintenance Keytruda.  The patient was advised to call immediately if he has any concerning symptoms in the interval. The patient voices understanding of current disease status and treatment options and is in agreement with the current care plan. All questions were answered. The patient knows to call the clinic with any problems, questions or concerns. We can certainly see the patient much sooner if necessary.

## 2017-05-18 ENCOUNTER — Ambulatory Visit
Admission: RE | Admit: 2017-05-18 | Discharge: 2017-05-18 | Disposition: A | Discharge status: Home / Self Care | Payer: Medicare Other | Source: Ambulatory Visit | Attending: Radiation Oncology | Admitting: Radiation Oncology

## 2017-05-18 DIAGNOSIS — C7949 Secondary malignant neoplasm of other parts of nervous system: Principal | ICD-10-CM

## 2017-05-18 DIAGNOSIS — C7931 Secondary malignant neoplasm of brain: Secondary | ICD-10-CM

## 2017-05-18 MED ORDER — GADOBENATE DIMEGLUMINE 529 MG/ML IV SOLN
15.0000 mL | Freq: Once | INTRAVENOUS | Status: AC | PRN
Start: 2017-05-18 — End: 2017-05-18
  Administered 2017-05-18: 15 mL via INTRAVENOUS

## 2017-05-23 ENCOUNTER — Ambulatory Visit
Admission: RE | Admit: 2017-05-23 | Discharge: 2017-05-23 | Disposition: A | Discharge status: Home / Self Care | Payer: Medicare Other | Source: Ambulatory Visit | Attending: Urology | Admitting: Urology

## 2017-05-23 ENCOUNTER — Encounter: Payer: Self-pay | Admitting: Urology

## 2017-05-23 ENCOUNTER — Other Ambulatory Visit: Payer: Self-pay

## 2017-05-23 VITALS — BP 120/62 | HR 53 | Temp 97.7°F | Resp 20 | Ht 71.0 in | Wt 154.6 lb

## 2017-05-23 DIAGNOSIS — M4854XA Collapsed vertebra, not elsewhere classified, thoracic region, initial encounter for fracture: Secondary | ICD-10-CM | POA: Insufficient documentation

## 2017-05-23 DIAGNOSIS — M5137 Other intervertebral disc degeneration, lumbosacral region: Secondary | ICD-10-CM | POA: Diagnosis not present

## 2017-05-23 DIAGNOSIS — G9389 Other specified disorders of brain: Secondary | ICD-10-CM | POA: Diagnosis not present

## 2017-05-23 DIAGNOSIS — C7951 Secondary malignant neoplasm of bone: Secondary | ICD-10-CM | POA: Diagnosis present

## 2017-05-23 DIAGNOSIS — K5900 Constipation, unspecified: Secondary | ICD-10-CM | POA: Diagnosis not present

## 2017-05-23 DIAGNOSIS — C3432 Malignant neoplasm of lower lobe, left bronchus or lung: Secondary | ICD-10-CM | POA: Diagnosis not present

## 2017-05-23 DIAGNOSIS — Z923 Personal history of irradiation: Secondary | ICD-10-CM | POA: Insufficient documentation

## 2017-05-23 DIAGNOSIS — M40204 Unspecified kyphosis, thoracic region: Secondary | ICD-10-CM | POA: Diagnosis not present

## 2017-05-23 DIAGNOSIS — J90 Pleural effusion, not elsewhere classified: Secondary | ICD-10-CM | POA: Insufficient documentation

## 2017-05-23 DIAGNOSIS — Z794 Long term (current) use of insulin: Secondary | ICD-10-CM | POA: Insufficient documentation

## 2017-05-23 DIAGNOSIS — Z79899 Other long term (current) drug therapy: Secondary | ICD-10-CM | POA: Insufficient documentation

## 2017-05-23 DIAGNOSIS — M4807 Spinal stenosis, lumbosacral region: Secondary | ICD-10-CM | POA: Diagnosis not present

## 2017-05-23 DIAGNOSIS — C7931 Secondary malignant neoplasm of brain: Secondary | ICD-10-CM

## 2017-05-23 NOTE — Progress Notes (Signed)
Radiation Oncology         [phone] ________________________________  Name: BLAS RICHES MRN: [id number]  Date: 05/23/2017  DOB: [birth year]  Post Treatment Note  CC: [name], MD  Grace Isaac, MD  Diagnosis:   84 y.o. 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  Brain 11/17/16- Leftcerebellartarget was treated to a prescription dose of 15Gy.  2.  Chest 11/21/2016 - 01/10/2017- The primary tumor in the LLL and involved mediastinal adenopathy were treated to 66 Gy in 33 fractions of 2 Gy.  Narrative:  The patient returns today for routine follow-up and to review results from his recent post-treatment brain MRI.  He has recovered well from the effects of radiotherapy and is without complaints at this point.  His recent brain MRI from 05/18/17 shows no evidence for residual or recurrent metastasis and no new metastasis.  He has continued on maintenance treatment with single agent Keytruda 200 mg IV every 3 weeks (first cycle March 14, 2017), status post 4 cycles and tolerating well. He was recently evaluated with Dr. Julien Nordmann and his recent systemic imaging with Chest/Abdomen/Pelvis CT on 05/16/17 shows shows stable disease overall with a slight increase in the right hilar lymph node, but the increase in size was not felt to be significant enough to change his treatment. Therefore, the recommendation was that he continue with maintenance Keytruda and continue to monitor the enlarged right hilar lymph node closely on repeat imaging studies in 3 months.                    On review of systems, the patient states that he is doing well overall.  His only complaint at present is generalized fatigue, weakness and decreased appetite which he attributes to ongoing systemic therapy. His weight has stabilized. He reports resolution of dysphagia and  is able to eat and drink as per normal though he had a swallowing study recently and was advised to use thickened liquids only but refuses to do so.  He reports that he continues drinking normal consistency liquids but only drinks small sips at a time.  He denies abdominal pain, N/V, diarrhea, or constipation.  He has not had recent fevers, chills or nightsweats.  He denies chest pain, increased productive cough or any hemoptysis. He has chronic productive cough with whitish colored sputum, unchanged recently.  He has noticed increased shortness of breath with exertion over the past month.  The shortness of breath improves at rest.  He denies headaches, visual or auditory changes, increased weakness/imbalance, difficulty with speech, seizure activity or tremor.  He has continued with left sided weakness, imbalance and instability with ambulation since his surgery but denies any progressive worsening.  He continues working with PT to regain strength and balance. In fact, he reports fewer falls at home associated with his imbalance due to being more conscientious and careful when ambulating around the house short distances. He continues using a rolling walker for safety around the house.  ALLERGIES:  is allergic to ciprofloxacin.  Meds: Current Outpatient Medications  Medication Sig Dispense Refill  . B-D INS SYR ULTRAFINE 1CC/31G 31G X 5/16" 1 ML MISC     . Cholecalciferol (VITAMIN D-1000 MAX ST) 1000 units tablet Take 1,000 Units by mouth daily.     . clotrimazole-betamethasone (LOTRISONE) cream Apply 1 application topically  2 (two) times daily.    Marland Kitchen docusate sodium (COLACE) 100 MG capsule Take 100 mg by mouth 2 (two) times daily as needed (for constipation.).    Marland Kitchen gabapentin (NEURONTIN) 400 MG capsule Take 400 mg by mouth at bedtime.    Marland Kitchen guaiFENesin-dextromethorphan (ROBITUSSIN DM) 100-10 MG/5ML syrup Take 15 mLs every 4 (four) hours as needed by mouth for cough.    Marland Kitchen HUMALOG 100 UNIT/ML injection  INJECT 2 TO 0.12MLS (2-8 UNITS) UNDER THE SKIN 3 TIMES A DAY BEFORE MEALS, AS PER SLIDING SCALE  0  . LANTUS 100 UNIT/ML injection INJECT 0.15ML (15 UNITS TOTAL) UNDER THE SKIN DAILY AT BEDTIME.  0  . lidocaine-prilocaine (EMLA) cream Apply 1 application topically as needed. 30 g 0  . magnesium hydroxide (MILK OF MAGNESIA) 400 MG/5ML suspension Take 30 mLs by mouth daily as needed (for constipation.).    Marland Kitchen mirtazapine (REMERON) 15 MG tablet Take 15 mg by mouth at bedtime.  0  . Multiple Vitamin (MULTIVITAMIN WITH MINERALS) TABS tablet Take 1 tablet by mouth daily.    Marland Kitchen neomycin-bacitracin-polymyxin (NEOSPORIN) [phone] ointment Apply 1 application topically at bedtime.    Marland Kitchen omeprazole (PRILOSEC) 20 MG capsule Take 20 mg by mouth at bedtime.     . polyethylene glycol (MIRALAX / GLYCOLAX) packet Take 17 g by mouth daily as needed (for constipation.).     Marland Kitchen rosuvastatin (CRESTOR) 20 MG tablet Take 20 mg by mouth daily.    . tamsulosin (FLOMAX) 0.4 MG CAPS capsule Take 1 capsule by mouth once.    Marland Kitchen acetaminophen (TYLENOL) 325 MG tablet Take 650 mg by mouth every 6 (six) hours as needed (for pain/headaches.).     Marland Kitchen albuterol (PROVENTIL HFA;VENTOLIN HFA) 108 (90 Base) MCG/ACT inhaler Inhale 2 puffs into the lungs every 6 (six) hours as needed (for wheezing/shortness of breath).     . prochlorperazine (COMPAZINE) 10 MG tablet Take 1 tablet (10 mg total) by mouth every 6 (six) hours as needed for nausea or vomiting. (Patient not taking: Reported on 05/23/2017) 30 tablet 0  . triamcinolone (KENALOG) 0.025 % ointment Apply 1 application 2 (two) times daily topically. (Patient not taking: Reported on 05/23/2017) 30 g 1   No current facility-administered medications for this encounter.     Physical Findings:  height is 5\' 11"  (1.803 m) and weight is 154 lb 9.6 oz (70.1 kg). His oral temperature is 97.7 F (36.5 C). His blood pressure is 120/62 and his pulse is 53 (abnormal). His respiration is 20 and oxygen  saturation is 95%.  Pain Assessment Pain Score: 0-No pain/10 In general this is a well appearing caucasian male in no acute distress. He's alert and oriented x4 and appropriate throughout the examination. Cardiopulmonary assessment is negative for acute distress and he exhibits normal effort.  He appears grossly neurologically intact.  Sensation is intact to light touch and strength is 4/5 and equal bilaterally in the lower extremities.  Lab Findings: Lab Results  Component Value Date   WBC 6.3 05/16/2017   HGB 11.1 (L) 04/25/2017   HCT 34.3 (L) 05/16/2017   MCV 102.3 (H) 05/16/2017   PLT 121 (L) 05/16/2017     Radiographic Findings: Ct Chest W Contrast  Result Date: 05/10/2017 CLINICAL DATA:  Metastatic lung cancer restaging EXAM: CT CHEST, ABDOMEN, AND PELVIS WITH CONTRAST TECHNIQUE: Multidetector CT imaging of the chest, abdomen and pelvis was performed following the standard protocol during bolus administration of intravenous contrast. CONTRAST:  111mL ISOVUE-300 IOPAMIDOL (ISOVUE-300) INJECTION  61% COMPARISON:  Multiple exams, including 02/17/2017 FINDINGS: CT CHEST FINDINGS Cardiovascular: Coronary, aortic arch, and branch vessel atherosclerotic vascular disease. Prior CABG noted. Collateral venous structures noted along the left upper extremity and axilla. Mediastinum/Nodes: Right hilar lymph node 1.1 cm in short axis, formerly 0.8 cm. Right eccentric subcarinal lymph node 1.4 cm in short axis, previously the same. Left infrahilar lymph node 0.8 cm in short axis, previously the same by my measurements. Contrast medium is present in the esophagus. Lungs/Pleura: Centrilobular emphysema. Moderate to large left and moderate right pleural effusions with associated passive atelectasis including most of the left lower lobe. The pleural effusions are nonspecific for transudative or exudative etiology; no pleural mass is identified. Musculoskeletal: Multilevel bridging spurring in the cervical spine.  Evolutionary findings in the oblique vertebral body compression fracture at T7 and inferior endplate compression fracture at T6, without significant progression of the degree of compression. No appreciable subluxation. There is some bridging spurring at most thoracic spine levels. Thoracic kyphosis is present. There 2-3 mm of posterior bony retropulsion at T6 and T7. CT ABDOMEN PELVIS FINDINGS Hepatobiliary: Mildly contracted gallbladder. No biliary dilatation. Otherwise unremarkable. Pancreas: Unremarkable Spleen: Unremarkable Adrenals/Urinary Tract: Bilateral renal atrophy. A 1.0 by 0.8 cm hypodense lesion of the right mid kidney is stable and likely a cyst although technically nonspecific due to small size and motion artifact. Stomach/Bowel: Prominent stool throughout the colon favors constipation. Accentuated wall thickening in the rectum on image 113/2 not seen on the prior exam, probably incidental. Vascular/Lymphatic: Aortoiliac atherosclerotic vascular disease. There is some venous collaterals from the left axillary region which track down along the left anterior abdominal wall to the left common iliac vein. No pathologic adenopathy identified. Reproductive: Unremarkable Other: No supplemental non-categorized findings. Musculoskeletal: Degenerative arthropathy of both hips. Lumbar spondylosis with bridging spurring and transitional S1 vertebra noted. There is mild bilateral foraminal stenosis at L5-S1 due to spondylosis and degenerative disc disease. IMPRESSION: 1. Mostly stable appearance of the chest although the right hilar lymph node is now mildly enlarged at 1.1 cm, previously 0.8 cm. Stable mildly enlarged right eccentric subcarinal lymph node and small left infrahilar lymph node. The left infrahilar mass is not readily apparent. Much of the left lower lobe is atelectatic likely due to a combination of passive atelectasis associated with the left pleural effusion, and prior radiation therapy. 2. No new  metastatic lesions are identified in the chest, abdomen, or pelvis. 3. Evolutionary findings in the compression fractures at T6 and T7. 4. Other imaging findings of potential clinical significance: Aortic Atherosclerosis (ICD10-I70.0) and Emphysema (ICD10-J43.9). Coronary atherosclerosis with prior CABG. Venous collateralization from the left upper extremity, tracking down as far as the left external iliac vein. Esophageal contrast suggesting reflux or dysmotility. Moderate to large left and moderate right pleural effusions with associated passive atelectasis. Bilateral renal atrophy. Prominent stool throughout the colon favors constipation. Mild bilateral foraminal impingement at L5-S1 (assumes transitional S1 vertebral numbering). Electronically Signed   By: Van Clines M.D.   On: 05/10/2017 13:59   Mr Jeri Cos ZJ Contrast  Result Date: 05/18/2017 CLINICAL DATA:  84 y/o M; SRS protocol, treated brain metastasis from lung cancer. Solitary target treated 11/17/2016. EXAM: MRI HEAD WITHOUT AND WITH CONTRAST TECHNIQUE: Multiplanar, multiecho pulse sequences of the brain and surrounding structures were obtained without and with intravenous contrast. CONTRAST:  80mL MULTIHANCE GADOBENATE DIMEGLUMINE 529 MG/ML IV SOLN COMPARISON:  03/03/2017 CT head.  02/16/2017 MRI head. FINDINGS: Brain: No acute infarction, hemorrhage, hydrocephalus, extra-axial collection  or mass lesion. Punctate focus of susceptibility hypointensity within the right occipital lobe compatible with hemosiderin deposition of chronic microhemorrhage. Stablenonspecific foci of T2 FLAIR hyperintense signal abnormality in subcortical and periventricular white matter are compatible with chronic microvascular ischemic changes for age. Stablebrain parenchymal volume loss. Left suboccipital craniectomy with hemosiderin stained encephalomalacia in the subjacent cerebellum extending to the middle cerebellar peduncle. Faint focus of enhancement within  the left middle cerebral peduncle (series 10, image 42) is diminished. Second focus of enhancement is no longer identified. No new abnormal enhancement. Vascular: Normal flow voids. Skull and upper cervical spine: Normal marrow signal. Sinuses/Orbits: Negative. Other: None. IMPRESSION: 1. No evidence for residual or recurrent metastasis. No new metastasis. 2. Stable encephalomalacia in the left cerebellum. Diminished punctate focus of enhancement in left middle cerebral peduncle and resolved second focus of enhancement, probably posttreatment changes. 3. Stable background of chronic microvascular ischemic changes and parenchymal volume loss of the brain. Electronically Signed   By: Kristine Garbe M.D.   On: 05/18/2017 14:13   Ct Abdomen Pelvis W Contrast  Result Date: 05/10/2017 CLINICAL DATA:  Metastatic lung cancer restaging EXAM: CT CHEST, ABDOMEN, AND PELVIS WITH CONTRAST TECHNIQUE: Multidetector CT imaging of the chest, abdomen and pelvis was performed following the standard protocol during bolus administration of intravenous contrast. CONTRAST:  124mL ISOVUE-300 IOPAMIDOL (ISOVUE-300) INJECTION 61% COMPARISON:  Multiple exams, including 02/17/2017 FINDINGS: CT CHEST FINDINGS Cardiovascular: Coronary, aortic arch, and branch vessel atherosclerotic vascular disease. Prior CABG noted. Collateral venous structures noted along the left upper extremity and axilla. Mediastinum/Nodes: Right hilar lymph node 1.1 cm in short axis, formerly 0.8 cm. Right eccentric subcarinal lymph node 1.4 cm in short axis, previously the same. Left infrahilar lymph node 0.8 cm in short axis, previously the same by my measurements. Contrast medium is present in the esophagus. Lungs/Pleura: Centrilobular emphysema. Moderate to large left and moderate right pleural effusions with associated passive atelectasis including most of the left lower lobe. The pleural effusions are nonspecific for transudative or exudative etiology;  no pleural mass is identified. Musculoskeletal: Multilevel bridging spurring in the cervical spine. Evolutionary findings in the oblique vertebral body compression fracture at T7 and inferior endplate compression fracture at T6, without significant progression of the degree of compression. No appreciable subluxation. There is some bridging spurring at most thoracic spine levels. Thoracic kyphosis is present. There 2-3 mm of posterior bony retropulsion at T6 and T7. CT ABDOMEN PELVIS FINDINGS Hepatobiliary: Mildly contracted gallbladder. No biliary dilatation. Otherwise unremarkable. Pancreas: Unremarkable Spleen: Unremarkable Adrenals/Urinary Tract: Bilateral renal atrophy. A 1.0 by 0.8 cm hypodense lesion of the right mid kidney is stable and likely a cyst although technically nonspecific due to small size and motion artifact. Stomach/Bowel: Prominent stool throughout the colon favors constipation. Accentuated wall thickening in the rectum on image 113/2 not seen on the prior exam, probably incidental. Vascular/Lymphatic: Aortoiliac atherosclerotic vascular disease. There is some venous collaterals from the left axillary region which track down along the left anterior abdominal wall to the left common iliac vein. No pathologic adenopathy identified. Reproductive: Unremarkable Other: No supplemental non-categorized findings. Musculoskeletal: Degenerative arthropathy of both hips. Lumbar spondylosis with bridging spurring and transitional S1 vertebra noted. There is mild bilateral foraminal stenosis at L5-S1 due to spondylosis and degenerative disc disease. IMPRESSION: 1. Mostly stable appearance of the chest although the right hilar lymph node is now mildly enlarged at 1.1 cm, previously 0.8 cm. Stable mildly enlarged right eccentric subcarinal lymph node and small left infrahilar lymph node.  The left infrahilar mass is not readily apparent. Much of the left lower lobe is atelectatic likely due to a combination of  passive atelectasis associated with the left pleural effusion, and prior radiation therapy. 2. No new metastatic lesions are identified in the chest, abdomen, or pelvis. 3. Evolutionary findings in the compression fractures at T6 and T7. 4. Other imaging findings of potential clinical significance: Aortic Atherosclerosis (ICD10-I70.0) and Emphysema (ICD10-J43.9). Coronary atherosclerosis with prior CABG. Venous collateralization from the left upper extremity, tracking down as far as the left external iliac vein. Esophageal contrast suggesting reflux or dysmotility. Moderate to large left and moderate right pleural effusions with associated passive atelectasis. Bilateral renal atrophy. Prominent stool throughout the colon favors constipation. Mild bilateral foraminal impingement at L5-S1 (assumes transitional S1 vertebral numbering). Electronically Signed   By: Van Clines M.D.   On: 05/10/2017 13:59    Impression/Plan: 1. 84 y.o. gentleman with Stage IVnon-small cell carcinoma of the left lower lung with solitary brain metastasis.    Recent post-treatment brain MRI from 05/18/17 shows no evidence for residual or recurrent metastasis and no new metastases.  There is stable encephalomalacia in the left cerebellum with diminished punctate focus of enhancement in the left middle cerebral peduncle and resolved second focus of enhancement, likely posttreatment changes.  He was recently evaluated with Dr. Julien Nordmann and follow up systemic imaging indicates overall disease stability.  The plan is to continue maintenance therapy with Keytruda. We will continue to follow him regularly to monitor for any recurrent or progressive brain disease.  We will plan to proceed with surveillance MRI of the brain every 3 months and a follow-up office visit thereafter to review results.  He appears to have a good understanding of this plan and is in agreement.  He knows to call with any questions or concerns in the  interim.    Nicholos Johns, PA-C

## 2017-06-05 ENCOUNTER — Other Ambulatory Visit: Payer: Self-pay | Admitting: Radiation Therapy

## 2017-06-05 DIAGNOSIS — C7931 Secondary malignant neoplasm of brain: Secondary | ICD-10-CM

## 2017-06-05 DIAGNOSIS — C7949 Secondary malignant neoplasm of other parts of nervous system: Principal | ICD-10-CM

## 2017-06-06 ENCOUNTER — Encounter: Payer: Self-pay | Admitting: Internal Medicine

## 2017-06-06 ENCOUNTER — Inpatient Hospital Stay (HOSPITAL_BASED_OUTPATIENT_CLINIC_OR_DEPARTMENT_OTHER): Payer: Medicare Other | Admitting: Internal Medicine

## 2017-06-06 ENCOUNTER — Inpatient Hospital Stay: Payer: Medicare Other

## 2017-06-06 VITALS — BP 125/62 | HR 55 | Temp 97.6°F | Resp 17 | Ht 71.0 in | Wt 158.5 lb

## 2017-06-06 DIAGNOSIS — C3492 Malignant neoplasm of unspecified part of left bronchus or lung: Secondary | ICD-10-CM

## 2017-06-06 DIAGNOSIS — I10 Essential (primary) hypertension: Secondary | ICD-10-CM | POA: Diagnosis not present

## 2017-06-06 DIAGNOSIS — C7931 Secondary malignant neoplasm of brain: Secondary | ICD-10-CM | POA: Diagnosis not present

## 2017-06-06 DIAGNOSIS — R5382 Chronic fatigue, unspecified: Secondary | ICD-10-CM

## 2017-06-06 DIAGNOSIS — Z5112 Encounter for antineoplastic immunotherapy: Secondary | ICD-10-CM | POA: Diagnosis not present

## 2017-06-06 DIAGNOSIS — Z95828 Presence of other vascular implants and grafts: Secondary | ICD-10-CM

## 2017-06-06 LAB — CMP (CANCER CENTER ONLY)
ALT: 13 U/L (ref 0–55)
AST: 20 U/L (ref 5–34)
Albumin: 3.3 g/dL — ABNORMAL LOW (ref 3.5–5.0)
Alkaline Phosphatase: 61 U/L (ref 40–150)
Anion gap: 7 (ref 3–11)
BUN: 25 mg/dL (ref 7–26)
CHLORIDE: 106 mmol/L (ref 98–109)
CO2: 26 mmol/L (ref 22–29)
CREATININE: 0.83 mg/dL (ref 0.70–1.30)
Calcium: 9.4 mg/dL (ref 8.4–10.4)
Glucose, Bld: 188 mg/dL — ABNORMAL HIGH (ref 70–140)
POTASSIUM: 4.3 mmol/L (ref 3.5–5.1)
Sodium: 139 mmol/L (ref 136–145)
TOTAL PROTEIN: 6.3 g/dL — AB (ref 6.4–8.3)
Total Bilirubin: 0.4 mg/dL (ref 0.2–1.2)

## 2017-06-06 LAB — CBC WITH DIFFERENTIAL (CANCER CENTER ONLY)
BASOS ABS: 0 10*3/uL (ref 0.0–0.1)
BASOS PCT: 0 %
EOS ABS: 0.4 10*3/uL (ref 0.0–0.5)
EOS PCT: 5 %
HCT: 37.6 % — ABNORMAL LOW (ref 38.4–49.9)
Hemoglobin: 12.4 g/dL — ABNORMAL LOW (ref 13.0–17.1)
Lymphocytes Relative: 6 %
Lymphs Abs: 0.5 10*3/uL — ABNORMAL LOW (ref 0.9–3.3)
MCH: 32.9 pg (ref 27.2–33.4)
MCHC: 33 g/dL (ref 32.0–36.0)
MCV: 99.7 fL — ABNORMAL HIGH (ref 79.3–98.0)
Monocytes Absolute: 0.7 10*3/uL (ref 0.1–0.9)
Monocytes Relative: 8 %
Neutro Abs: 6.3 10*3/uL (ref 1.5–6.5)
Neutrophils Relative %: 81 %
PLATELETS: 132 10*3/uL — AB (ref 140–400)
RBC: 3.77 MIL/uL — AB (ref 4.20–5.82)
RDW: 14.2 % (ref 11.0–14.6)
WBC: 7.9 10*3/uL (ref 4.0–10.3)

## 2017-06-06 LAB — TSH: TSH: 0.857 u[IU]/mL (ref 0.320–4.118)

## 2017-06-06 MED ORDER — SODIUM CHLORIDE 0.9% FLUSH
10.0000 mL | Freq: Once | INTRAVENOUS | Status: AC
  Administered 2017-06-06: 10 mL
  Filled 2017-06-06: qty 10

## 2017-06-06 MED ORDER — HEPARIN SOD (PORK) LOCK FLUSH 100 UNIT/ML IV SOLN
500.0000 [IU] | Freq: Once | INTRAVENOUS | Status: AC | PRN
  Administered 2017-06-06: 500 [IU]
  Filled 2017-06-06: qty 5

## 2017-06-06 MED ORDER — SODIUM CHLORIDE 0.9 % IV SOLN
200.0000 mg | Freq: Once | INTRAVENOUS | Status: AC
  Administered 2017-06-06: 200 mg via INTRAVENOUS
  Filled 2017-06-06: qty 8

## 2017-06-06 MED ORDER — SODIUM CHLORIDE 0.9% FLUSH
10.0000 mL | INTRAVENOUS | Status: DC | PRN
  Administered 2017-06-06: 10 mL
  Filled 2017-06-06: qty 10

## 2017-06-06 MED ORDER — SODIUM CHLORIDE 0.9 % IV SOLN
Freq: Once | INTRAVENOUS | Status: AC
  Administered 2017-06-06: 12:00:00 via INTRAVENOUS

## 2017-06-06 NOTE — Progress Notes (Signed)
[place] Telephone:([phone]) [phone]   [phone]  OFFICE PROGRESS NOTE  [name], MD [address] [place] [address]  DIAGNOSIS: Stage IV (T1b, N3, M1c) non-small cell lung cancer, squamous cell carcinoma diagnosed in September 2018 presented with left infrahilar mass in addition to mediastinal and supraclavicular lymphadenopathy as well as metastatic brain lesion.  PDL1 expression 0%.  PRIOR THERAPY:  1) Palliative radiotherapy to the brain metastasis. 2)  Systemic chemotherapy with carboplatin for AUC of 5, paclitaxel 175 MG/M2 and Ketruda (pembrolizumab) 200 MG IV every 3 weeks. First dose 11/21/2016.  Status post 4 cycles.  CURRENT THERAPY: Maintenance treatment with single agent Keytruda 200 mg IV every 3 weeks.  First cycle March 14, 2017.  Status post 6 cycles.  INTERVAL HISTORY: Steve Frank 84 y.o. male returns to the clinic today for follow-up visit accompanied by his wife.  The patient is feeling fine today with no specific complaints.  He continues to tolerate his treatment with immunotherapy with Keytruda fairly well.  The patient denied having any chest pain, shortness of breath except with exertion with no cough or hemoptysis.  He has no fever or chills.  He denied having any nausea, vomiting, diarrhea or constipation.  He lost few pounds since his last visit.  He is here today for evaluation before starting cycle #7 of his treatment.  MEDICAL HISTORY: Past Medical History:  Diagnosis Date  . Arthritis   . BPH (benign prostatic hyperplasia)   . CAD (coronary artery disease)    s/p 2 cabg's  . Cancer (HCC)    Basal and squamous cell  skin cancer  . Cerebellar mass   . Chest mass   . Diabetes ([place])    Type II  . GERD (gastroesophageal reflux disease)   . Head injury with loss of consciousness [facility])    age 89 or 62-   . Heart attack ([place])   . HLD (hyperlipidemia)   . Hypertension   . Macular degeneration   . Pneumonia      x 2 last 1071  . Stage IV squamous cell carcinoma of left lung ([place]) 10/20/2016    ALLERGIES:  is allergic to ciprofloxacin.  MEDICATIONS:  Current Outpatient Medications  Medication Sig Dispense Refill  . acetaminophen (TYLENOL) 325 MG tablet Take 650 mg by mouth every 6 (six) hours as needed (for pain/headaches.).     Marland Kitchen albuterol (PROVENTIL HFA;VENTOLIN HFA) 108 (90 Base) MCG/ACT inhaler Inhale 2 puffs into the lungs every 6 (six) hours as needed (for wheezing/shortness of breath).     Marland Kitchen amLODipine (NORVASC) 5 MG tablet Take by mouth.    . B-D INS SYR ULTRAFINE 1CC/31G 31G X 5/16" 1 ML MISC     . bisacodyl (DULCOLAX) 10 MG suppository Place rectally.    . carvedilol (COREG) 25 MG tablet Take by mouth.    . Cholecalciferol (VITAMIN D-1000 MAX ST) 1000 units tablet Take 1,000 Units by mouth daily.     . clotrimazole-betamethasone (LOTRISONE) cream Apply 1 application topically 2 (two) times daily.    Marland Kitchen docusate sodium (COLACE) 100 MG capsule Take 100 mg by mouth 2 (two) times daily as needed (for constipation.).    Marland Kitchen furosemide (LASIX) 20 MG tablet Take by mouth.    . gabapentin (NEURONTIN) 400 MG capsule Take 400 mg by mouth at bedtime.    Marland Kitchen guaiFENesin-dextromethorphan (ROBITUSSIN DM) 100-10 MG/5ML syrup Take 15 mLs every 4 (four) hours as needed by  mouth for cough.    Marland Kitchen HUMALOG 100 UNIT/ML injection INJECT 2 TO 0.12MLS (2-8 UNITS) UNDER THE SKIN 3 TIMES A DAY BEFORE MEALS, AS PER SLIDING SCALE  0  . Lancets (FREESTYLE) lancets     . LANTUS 100 UNIT/ML injection INJECT 0.15ML (15 UNITS TOTAL) UNDER THE SKIN DAILY AT BEDTIME.  0  . lidocaine-prilocaine (EMLA) cream Apply 1 application topically as needed. 30 g 0  . magnesium hydroxide (MILK OF MAGNESIA) 400 MG/5ML suspension Take 30 mLs by mouth daily as needed (for constipation.).    Marland Kitchen mirtazapine (REMERON) 15 MG tablet Take 15 mg by mouth at bedtime.  0  . Multiple Vitamin (MULTIVITAMIN WITH MINERALS) TABS tablet Take 1 tablet by  mouth daily.    Marland Kitchen neomycin-bacitracin-polymyxin (NEOSPORIN) [phone] ointment Apply 1 application topically at bedtime.    Marland Kitchen omeprazole (PRILOSEC) 20 MG capsule Take 20 mg by mouth at bedtime.     . polyethylene glycol (MIRALAX / GLYCOLAX) packet Take 17 g by mouth daily as needed (for constipation.).     Marland Kitchen prochlorperazine (COMPAZINE) 10 MG tablet Take 1 tablet (10 mg total) by mouth every 6 (six) hours as needed for nausea or vomiting. 30 tablet 0  . rosuvastatin (CRESTOR) 20 MG tablet Take 20 mg by mouth daily.    . Sodium Phosphates (ENEMA) 7-19 GM/118ML ENEM Place rectally.    . tamsulosin (FLOMAX) 0.4 MG CAPS capsule Take 1 capsule by mouth once.    . triamcinolone (KENALOG) 0.025 % ointment Apply 1 application 2 (two) times daily topically. 30 g 1   No current facility-administered medications for this visit.     SURGICAL HISTORY:  Past Surgical History:  Procedure Laterality Date  . CARDIAC CATHETERIZATION    . COLONOSCOPY W/ POLYPECTOMY    . CORONARY ARTERY BYPASS GRAFT     Dr Prescott Gum [facility] 07/2007  . CORONARY ARTERY BYPASS GRAFT     1st one was @ [facility] 26years ago 12/1991  . CRANIECTOMY FOR EXCISION OF BRAIN TUMOR INFRATENTORIAL / POSTERIOR FOSSA  08/05/2016   Dr Rebecka Apley  @ [facility]/Neurosurgery  . FLEXIBLE BRONCHOSCOPY  08/07/2016   Dr Gwenevere Ghazi  . IR FLUORO GUIDE PORT INSERTION RIGHT  11/18/2016  . IR THORACENTESIS ASP PLEURAL SPACE W/IMG GUIDE  03/09/2017  . IR US GUIDE VASC ACCESS RIGHT  11/18/2016  . LUNG BIOPSY N/A 10/14/2016   Procedure: LUNG BIOPSY;  Surgeon: Grace Isaac, MD;  Location: [facility];  Service: Thoracic;  Laterality: N/A;  . VIDEO BRONCHOSCOPY WITH ENDOBRONCHIAL ULTRASOUND N/A 10/14/2016   Procedure: VIDEO BRONCHOSCOPY WITH ENDOBRONCHIAL ULTRASOUND;  Surgeon: Grace Isaac, MD;  Location: [facility];  Service: Thoracic;  Laterality: N/A;    REVIEW OF SYSTEMS:  A comprehensive review of systems was negative except for: Constitutional:  positive for fatigue and weight loss Respiratory: positive for dyspnea on exertion   PHYSICAL EXAMINATION: General appearance: alert, cooperative, fatigued and no distress Head: Normocephalic, without obvious abnormality, atraumatic Neck: no adenopathy, no JVD, supple, symmetrical, trachea midline and thyroid not enlarged, symmetric, no tenderness/mass/nodules Lymph nodes: Cervical, supraclavicular, and axillary nodes normal. Resp: clear to auscultation bilaterally Back: symmetric, no curvature. ROM normal. No CVA tenderness. Cardio: regular rate and rhythm, S1, S2 normal, no murmur, click, rub or gallop GI: soft, non-tender; bowel sounds normal; no masses,  no organomegaly Extremities: extremities normal, atraumatic, no cyanosis or edema  ECOG PERFORMANCE STATUS: 1 - Symptomatic but completely ambulatory  Blood pressure 125/62, pulse (!) 55, temperature  97.6 F (36.4 C), temperature source Oral, resp. rate 17, height _0  (1.803 m), weight 158 lb 8 oz (71.9 kg), SpO2 96 %.  LABORATORY DATA: Lab Results  Component Value Date   WBC 6.3 05/16/2017   HGB 11.4 (L) 05/16/2017   HCT 34.3 (L) 05/16/2017   MCV 102.3 (H) 05/16/2017   PLT 121 (L) 05/16/2017      Chemistry      Component Value Date/Time   NA 138 05/16/2017 0953   NA 138 02/08/2017 1118   K 4.0 05/16/2017 0953   K 4.5 02/08/2017 1118   CL 107 05/16/2017 0953   CO2 25 05/16/2017 0953   CO2 24 02/08/2017 1118   BUN 22 05/16/2017 0953   BUN 16.4 02/08/2017 1118   CREATININE 0.83 05/16/2017 0953   CREATININE 0.9 02/08/2017 1118      Component Value Date/Time   CALCIUM 9.0 05/16/2017 0953   CALCIUM 9.0 02/08/2017 1118   ALKPHOS 57 05/16/2017 0953   ALKPHOS 72 02/08/2017 1118   AST 18 05/16/2017 0953   AST 19 02/08/2017 1118   ALT 13 05/16/2017 0953   ALT 15 02/08/2017 1118   BILITOT 0.4 05/16/2017 0953   BILITOT 0.53 02/08/2017 1118       RADIOGRAPHIC STUDIES: Ct Chest W Contrast  Result Date:  05/10/2017 CLINICAL DATA:  Metastatic lung cancer restaging EXAM: CT CHEST, ABDOMEN, AND PELVIS WITH CONTRAST TECHNIQUE: Multidetector CT imaging of the chest, abdomen and pelvis was performed following the standard protocol during bolus administration of intravenous contrast. CONTRAST:  178m ISOVUE-300 IOPAMIDOL (ISOVUE-300) INJECTION 61% COMPARISON:  Multiple exams, including 02/17/2017 FINDINGS: CT CHEST FINDINGS Cardiovascular: Coronary, aortic arch, and branch vessel atherosclerotic vascular disease. Prior CABG noted. Collateral venous structures noted along the left upper extremity and axilla. Mediastinum/Nodes: Right hilar lymph node 1.1 cm in short axis, formerly 0.8 cm. Right eccentric subcarinal lymph node 1.4 cm in short axis, previously the same. Left infrahilar lymph node 0.8 cm in short axis, previously the same by my measurements. Contrast medium is present in the esophagus. Lungs/Pleura: Centrilobular emphysema. Moderate to large left and moderate right pleural effusions with associated passive atelectasis including most of the left lower lobe. The pleural effusions are nonspecific for transudative or exudative etiology; no pleural mass is identified. Musculoskeletal: Multilevel bridging spurring in the cervical spine. Evolutionary findings in the oblique vertebral body compression fracture at T7 and inferior endplate compression fracture at T6, without significant progression of the degree of compression. No appreciable subluxation. There is some bridging spurring at most thoracic spine levels. Thoracic kyphosis is present. There 2-3 mm of posterior bony retropulsion at T6 and T7. CT ABDOMEN PELVIS FINDINGS Hepatobiliary: Mildly contracted gallbladder. No biliary dilatation. Otherwise unremarkable. Pancreas: Unremarkable Spleen: Unremarkable Adrenals/Urinary Tract: Bilateral renal atrophy. A 1.0 by 0.8 cm hypodense lesion of the right mid kidney is stable and likely a cyst although technically  nonspecific due to small size and motion artifact. Stomach/Bowel: Prominent stool throughout the colon favors constipation. Accentuated wall thickening in the rectum on image 113/2 not seen on the prior exam, probably incidental. Vascular/Lymphatic: Aortoiliac atherosclerotic vascular disease. There is some venous collaterals from the left axillary region which track down along the left anterior abdominal wall to the left common iliac vein. No pathologic adenopathy identified. Reproductive: Unremarkable Other: No supplemental non-categorized findings. Musculoskeletal: Degenerative arthropathy of both hips. Lumbar spondylosis with bridging spurring and transitional S1 vertebra noted. There is mild bilateral foraminal stenosis at L5-S1 due to spondylosis and  degenerative disc disease. IMPRESSION: 1. Mostly stable appearance of the chest although the right hilar lymph node is now mildly enlarged at 1.1 cm, previously 0.8 cm. Stable mildly enlarged right eccentric subcarinal lymph node and small left infrahilar lymph node. The left infrahilar mass is not readily apparent. Much of the left lower lobe is atelectatic likely due to a combination of passive atelectasis associated with the left pleural effusion, and prior radiation therapy. 2. No new metastatic lesions are identified in the chest, abdomen, or pelvis. 3. Evolutionary findings in the compression fractures at T6 and T7. 4. Other imaging findings of potential clinical significance: Aortic Atherosclerosis (ICD10-I70.0) and Emphysema (ICD10-J43.9). Coronary atherosclerosis with prior CABG. Venous collateralization from the left upper extremity, tracking down as far as the left external iliac vein. Esophageal contrast suggesting reflux or dysmotility. Moderate to large left and moderate right pleural effusions with associated passive atelectasis. Bilateral renal atrophy. Prominent stool throughout the colon favors constipation. Mild bilateral foraminal impingement  at L5-S1 (assumes transitional S1 vertebral numbering). Electronically Signed   By: Van Clines M.D.   On: 05/10/2017 13:59   Mr Jeri Cos VW Contrast  Result Date: 05/18/2017 CLINICAL DATA:  84 y/o M; SRS protocol, treated brain metastasis from lung cancer. Solitary target treated 11/17/2016. EXAM: MRI HEAD WITHOUT AND WITH CONTRAST TECHNIQUE: Multiplanar, multiecho pulse sequences of the brain and surrounding structures were obtained without and with intravenous contrast. CONTRAST:  15m MULTIHANCE GADOBENATE DIMEGLUMINE 529 MG/ML IV SOLN COMPARISON:  03/03/2017 CT head.  02/16/2017 MRI head. FINDINGS: Brain: No acute infarction, hemorrhage, hydrocephalus, extra-axial collection or mass lesion. Punctate focus of susceptibility hypointensity within the right occipital lobe compatible with hemosiderin deposition of chronic microhemorrhage. Stablenonspecific foci of T2 FLAIR hyperintense signal abnormality in subcortical and periventricular white matter are compatible with chronic microvascular ischemic changes for age. Stablebrain parenchymal volume loss. Left suboccipital craniectomy with hemosiderin stained encephalomalacia in the subjacent cerebellum extending to the middle cerebellar peduncle. Faint focus of enhancement within the left middle cerebral peduncle (series 10, image 42) is diminished. Second focus of enhancement is no longer identified. No new abnormal enhancement. Vascular: Normal flow voids. Skull and upper cervical spine: Normal marrow signal. Sinuses/Orbits: Negative. Other: None. IMPRESSION: 1. No evidence for residual or recurrent metastasis. No new metastasis. 2. Stable encephalomalacia in the left cerebellum. Diminished punctate focus of enhancement in left middle cerebral peduncle and resolved second focus of enhancement, probably posttreatment changes. 3. Stable background of chronic microvascular ischemic changes and parenchymal volume loss of the brain. Electronically Signed    By: LKristine GarbeM.D.   On: 05/18/2017 14:13   Ct Abdomen Pelvis W Contrast  Result Date: 05/10/2017 CLINICAL DATA:  Metastatic lung cancer restaging EXAM: CT CHEST, ABDOMEN, AND PELVIS WITH CONTRAST TECHNIQUE: Multidetector CT imaging of the chest, abdomen and pelvis was performed following the standard protocol during bolus administration of intravenous contrast. CONTRAST:  1045mISOVUE-300 IOPAMIDOL (ISOVUE-300) INJECTION 61% COMPARISON:  Multiple exams, including 02/17/2017 FINDINGS: CT CHEST FINDINGS Cardiovascular: Coronary, aortic arch, and branch vessel atherosclerotic vascular disease. Prior CABG noted. Collateral venous structures noted along the left upper extremity and axilla. Mediastinum/Nodes: Right hilar lymph node 1.1 cm in short axis, formerly 0.8 cm. Right eccentric subcarinal lymph node 1.4 cm in short axis, previously the same. Left infrahilar lymph node 0.8 cm in short axis, previously the same by my measurements. Contrast medium is present in the esophagus. Lungs/Pleura: Centrilobular emphysema. Moderate to large left and moderate right pleural effusions with associated  passive atelectasis including most of the left lower lobe. The pleural effusions are nonspecific for transudative or exudative etiology; no pleural mass is identified. Musculoskeletal: Multilevel bridging spurring in the cervical spine. Evolutionary findings in the oblique vertebral body compression fracture at T7 and inferior endplate compression fracture at T6, without significant progression of the degree of compression. No appreciable subluxation. There is some bridging spurring at most thoracic spine levels. Thoracic kyphosis is present. There 2-3 mm of posterior bony retropulsion at T6 and T7. CT ABDOMEN PELVIS FINDINGS Hepatobiliary: Mildly contracted gallbladder. No biliary dilatation. Otherwise unremarkable. Pancreas: Unremarkable Spleen: Unremarkable Adrenals/Urinary Tract: Bilateral renal atrophy. A 1.0  by 0.8 cm hypodense lesion of the right mid kidney is stable and likely a cyst although technically nonspecific due to small size and motion artifact. Stomach/Bowel: Prominent stool throughout the colon favors constipation. Accentuated wall thickening in the rectum on image 113/2 not seen on the prior exam, probably incidental. Vascular/Lymphatic: Aortoiliac atherosclerotic vascular disease. There is some venous collaterals from the left axillary region which track down along the left anterior abdominal wall to the left common iliac vein. No pathologic adenopathy identified. Reproductive: Unremarkable Other: No supplemental non-categorized findings. Musculoskeletal: Degenerative arthropathy of both hips. Lumbar spondylosis with bridging spurring and transitional S1 vertebra noted. There is mild bilateral foraminal stenosis at L5-S1 due to spondylosis and degenerative disc disease. IMPRESSION: 1. Mostly stable appearance of the chest although the right hilar lymph node is now mildly enlarged at 1.1 cm, previously 0.8 cm. Stable mildly enlarged right eccentric subcarinal lymph node and small left infrahilar lymph node. The left infrahilar mass is not readily apparent. Much of the left lower lobe is atelectatic likely due to a combination of passive atelectasis associated with the left pleural effusion, and prior radiation therapy. 2. No new metastatic lesions are identified in the chest, abdomen, or pelvis. 3. Evolutionary findings in the compression fractures at T6 and T7. 4. Other imaging findings of potential clinical significance: Aortic Atherosclerosis (ICD10-I70.0) and Emphysema (ICD10-J43.9). Coronary atherosclerosis with prior CABG. Venous collateralization from the left upper extremity, tracking down as far as the left external iliac vein. Esophageal contrast suggesting reflux or dysmotility. Moderate to large left and moderate right pleural effusions with associated passive atelectasis. Bilateral renal  atrophy. Prominent stool throughout the colon favors constipation. Mild bilateral foraminal impingement at L5-S1 (assumes transitional S1 vertebral numbering). Electronically Signed   By: Van Clines M.D.   On: 05/10/2017 13:59    ASSESSMENT AND PLAN: This is a very pleasant 84 years old white male with metastatic non-small cell lung cancer, squamous cell carcinoma with negative PDL 1 expression and enlarging solitary brain metastasis.  The patient completed palliative radiotherapy to the brain metastasis. He completed systemic chemotherapy with carboplatin, paclitaxel and Keytruda status post 4 cycles.  He has no evidence for disease progression with this treatment. The patient is currently undergoing treatment with single agent Keytruda status post 6 cycles.  He continues to tolerate this treatment well. His last imaging studies showed no evidence for disease progression.  I recommended for him to proceed with cycle #7 today as a scheduled. He will come back for follow-up visit in 3 weeks for evaluation before the next cycle of his treatment. The patient was advised to call immediately if he has any concerning symptoms in the interval. The patient voices understanding of current disease status and treatment options and is in agreement with the current care plan. All questions were answered. The patient knows to call  the clinic with any problems, questions or concerns. We can certainly see the patient much sooner if necessary.  Disclaimer: This note was dictated with voice recognition software. Similar sounding words can inadvertently be transcribed and may not be corrected upon review.

## 2017-06-06 NOTE — Patient Instructions (Signed)
[facility] Discharge Instructions for Patients Receiving Chemotherapy  Today you received the following chemotherapy agents:  Keytruda.  To help prevent nausea and vomiting after your treatment, we encourage you to take your nausea medication as directed.   If you develop nausea and vomiting that is not controlled by your nausea medication, call the clinic.   BELOW ARE SYMPTOMS THAT SHOULD BE REPORTED IMMEDIATELY:  *FEVER GREATER THAN 100.5 F  *CHILLS WITH OR WITHOUT FEVER  NAUSEA AND VOMITING THAT IS NOT CONTROLLED WITH YOUR NAUSEA MEDICATION  *UNUSUAL SHORTNESS OF BREATH  *UNUSUAL BRUISING OR BLEEDING  TENDERNESS IN MOUTH AND THROAT WITH OR WITHOUT PRESENCE OF ULCERS  *URINARY PROBLEMS  *BOWEL PROBLEMS  UNUSUAL RASH Items with * indicate a potential emergency and should be followed up as soon as possible.  Feel free to call the clinic should you have any questions or concerns. The clinic phone number is [phone].  Please show the CHEMO ALERT CARD at check-in to the Emergency Department and triage nurse.    

## 2017-06-27 ENCOUNTER — Inpatient Hospital Stay (HOSPITAL_BASED_OUTPATIENT_CLINIC_OR_DEPARTMENT_OTHER): Payer: Medicare Other | Admitting: Internal Medicine

## 2017-06-27 ENCOUNTER — Telehealth: Payer: Self-pay | Admitting: Internal Medicine

## 2017-06-27 ENCOUNTER — Encounter: Payer: Self-pay | Admitting: Internal Medicine

## 2017-06-27 ENCOUNTER — Inpatient Hospital Stay: Payer: Medicare Other | Attending: Internal Medicine

## 2017-06-27 ENCOUNTER — Inpatient Hospital Stay: Payer: Medicare Other

## 2017-06-27 DIAGNOSIS — Z9221 Personal history of antineoplastic chemotherapy: Secondary | ICD-10-CM

## 2017-06-27 DIAGNOSIS — C3412 Malignant neoplasm of upper lobe, left bronchus or lung: Secondary | ICD-10-CM | POA: Diagnosis not present

## 2017-06-27 DIAGNOSIS — E119 Type 2 diabetes mellitus without complications: Secondary | ICD-10-CM | POA: Diagnosis not present

## 2017-06-27 DIAGNOSIS — C7931 Secondary malignant neoplasm of brain: Secondary | ICD-10-CM | POA: Diagnosis not present

## 2017-06-27 DIAGNOSIS — Z79899 Other long term (current) drug therapy: Secondary | ICD-10-CM

## 2017-06-27 DIAGNOSIS — R5382 Chronic fatigue, unspecified: Secondary | ICD-10-CM

## 2017-06-27 DIAGNOSIS — C778 Secondary and unspecified malignant neoplasm of lymph nodes of multiple regions: Secondary | ICD-10-CM | POA: Insufficient documentation

## 2017-06-27 DIAGNOSIS — I10 Essential (primary) hypertension: Secondary | ICD-10-CM | POA: Insufficient documentation

## 2017-06-27 DIAGNOSIS — Z5112 Encounter for antineoplastic immunotherapy: Secondary | ICD-10-CM | POA: Insufficient documentation

## 2017-06-27 DIAGNOSIS — Z95828 Presence of other vascular implants and grafts: Secondary | ICD-10-CM

## 2017-06-27 DIAGNOSIS — C3492 Malignant neoplasm of unspecified part of left bronchus or lung: Secondary | ICD-10-CM

## 2017-06-27 DIAGNOSIS — C3490 Malignant neoplasm of unspecified part of unspecified bronchus or lung: Secondary | ICD-10-CM

## 2017-06-27 LAB — CBC WITH DIFFERENTIAL (CANCER CENTER ONLY)
BASOS ABS: 0.1 10*3/uL (ref 0.0–0.1)
Basophils Relative: 1 %
EOS PCT: 5 %
Eosinophils Absolute: 0.4 10*3/uL (ref 0.0–0.5)
HEMATOCRIT: 37.2 % — AB (ref 38.4–49.9)
Hemoglobin: 12.4 g/dL — ABNORMAL LOW (ref 13.0–17.1)
LYMPHS ABS: 0.4 10*3/uL — AB (ref 0.9–3.3)
Lymphocytes Relative: 6 %
MCH: 33.1 pg (ref 27.2–33.4)
MCHC: 33.3 g/dL (ref 32.0–36.0)
MCV: 99.4 fL — AB (ref 79.3–98.0)
MONO ABS: 0.5 10*3/uL (ref 0.1–0.9)
MONOS PCT: 7 %
NEUTROS ABS: 6.5 10*3/uL (ref 1.5–6.5)
Neutrophils Relative %: 81 %
PLATELETS: 135 10*3/uL — AB (ref 140–400)
RBC: 3.74 MIL/uL — ABNORMAL LOW (ref 4.20–5.82)
RDW: 15.3 % — AB (ref 11.0–14.6)
WBC Count: 7.9 10*3/uL (ref 4.0–10.3)

## 2017-06-27 LAB — CMP (CANCER CENTER ONLY)
ALT: 20 U/L (ref 0–55)
ANION GAP: 6 (ref 3–11)
AST: 24 U/L (ref 5–34)
Albumin: 3.1 g/dL — ABNORMAL LOW (ref 3.5–5.0)
Alkaline Phosphatase: 53 U/L (ref 40–150)
BUN: 26 mg/dL (ref 7–26)
CALCIUM: 8.9 mg/dL (ref 8.4–10.4)
CO2: 25 mmol/L (ref 22–29)
CREATININE: 0.82 mg/dL (ref 0.70–1.30)
Chloride: 107 mmol/L (ref 98–109)
GFR, Est AFR Am: >60 mL/min (ref >60)
GFR, Estimated: >60 mL/min (ref >60)
GLUCOSE: 185 mg/dL — AB (ref 70–140)
Potassium: 4.3 mmol/L (ref 3.5–5.1)
Sodium: 138 mmol/L (ref 136–145)
Total Bilirubin: 0.4 mg/dL (ref 0.2–1.2)
Total Protein: 6.3 g/dL — ABNORMAL LOW (ref 6.4–8.3)

## 2017-06-27 LAB — TSH: TSH: 1.113 u[IU]/mL (ref 0.320–4.118)

## 2017-06-27 MED ORDER — SODIUM CHLORIDE 0.9 % IV SOLN
200.0000 mg | Freq: Once | INTRAVENOUS | Status: AC
  Administered 2017-06-27: 200 mg via INTRAVENOUS
  Filled 2017-06-27: qty 8

## 2017-06-27 MED ORDER — SODIUM CHLORIDE 0.9% FLUSH
10.0000 mL | INTRAVENOUS | Status: DC | PRN
  Administered 2017-06-27: 10 mL
  Filled 2017-06-27: qty 10

## 2017-06-27 MED ORDER — SODIUM CHLORIDE 0.9 % IV SOLN
Freq: Once | INTRAVENOUS | Status: AC
  Administered 2017-06-27: 11:00:00 via INTRAVENOUS

## 2017-06-27 MED ORDER — HEPARIN SOD (PORK) LOCK FLUSH 100 UNIT/ML IV SOLN
500.0000 [IU] | Freq: Once | INTRAVENOUS | Status: AC | PRN
  Administered 2017-06-27: 500 [IU]
  Filled 2017-06-27: qty 5

## 2017-06-27 MED ORDER — SODIUM CHLORIDE 0.9% FLUSH
10.0000 mL | Freq: Once | INTRAVENOUS | Status: AC
  Administered 2017-06-27: 10 mL
  Filled 2017-06-27: qty 10

## 2017-06-27 NOTE — Patient Instructions (Signed)
Implanted Port Home Guide An implanted port is a type of central line that is placed under the skin. Central lines are used to provide IV access when treatment or nutrition needs to be given through a person's veins. Implanted ports are used for long-term IV access. An implanted port may be placed because:  You need IV medicine that would be irritating to the small veins in your hands or arms.  You need long-term IV medicines, such as antibiotics.  You need IV nutrition for a long period.  You need frequent blood draws for lab tests.  You need dialysis.  Implanted ports are usually placed in the chest area, but they can also be placed in the upper arm, the abdomen, or the leg. An implanted port has two main parts:  Reservoir. The reservoir is round and will appear as a small, raised area under your skin. The reservoir is the part where a needle is inserted to give medicines or draw blood.  Catheter. The catheter is a thin, flexible tube that extends from the reservoir. The catheter is placed into a large vein. Medicine that is inserted into the reservoir goes into the catheter and then into the vein.  How will I care for my incision site? Do not get the incision site wet. Bathe or shower as directed by your health care provider. How is my port accessed? Special steps must be taken to access the port:  Before the port is accessed, a numbing cream can be placed on the skin. This helps numb the skin over the port site.  Your health care provider uses a sterile technique to access the port. ? Your health care provider must put on a mask and sterile gloves. ? The skin over your port is cleaned carefully with an antiseptic and allowed to dry. ? The port is gently pinched between sterile gloves, and a needle is inserted into the port.  Only "non-coring" port needles should be used to access the port. Once the port is accessed, a blood return should be checked. This helps ensure that the port  is in the vein and is not clogged.  If your port needs to remain accessed for a constant infusion, a clear (transparent) bandage will be placed over the needle site. The bandage and needle will need to be changed every week, or as directed by your health care provider.  Keep the bandage covering the needle clean and dry. Do not get it wet. Follow your health care provider's instructions on how to take a shower or bath while the port is accessed.  If your port does not need to stay accessed, no bandage is needed over the port.  What is flushing? Flushing helps keep the port from getting clogged. Follow your health care provider's instructions on how and when to flush the port. Ports are usually flushed with saline solution or a medicine called heparin. The need for flushing will depend on how the port is used.  If the port is used for intermittent medicines or blood draws, the port will need to be flushed: ? After medicines have been given. ? After blood has been drawn. ? As part of routine maintenance.  If a constant infusion is running, the port may not need to be flushed.  How long will my port stay implanted? The port can stay in for as long as your health care provider thinks it is needed. When it is time for the port to come out, surgery will be   done to remove it. The procedure is similar to the one performed when the port was put in. When should I seek immediate medical care? When you have an implanted port, you should seek immediate medical care if:  You notice a bad smell coming from the incision site.  You have swelling, redness, or drainage at the incision site.  You have more swelling or pain at the port site or the surrounding area.  You have a fever that is not controlled with medicine.  This information is not intended to replace advice given to you by your health care provider. Make sure you discuss any questions you have with your health care provider. Document  Released: 01/24/2005 Document Revised: 07/02/2015 Document Reviewed: 10/01/2012 Elsevier Interactive Patient Education  2017 [employer]  

## 2017-06-27 NOTE — Progress Notes (Signed)
SATURATION QUALIFICATIONS: (This note is used to comply with regulatory documentation for home oxygen)  Patient Saturations on Room Air at Rest = 97%  Patient Saturations on Room Air while Ambulating = 91%-pt unable to complete test due to sob and leg weakness.   Patient Saturations on2 Liters of oxygen while Ambulating = 96%  Please briefly explain why patient needs home oxygen:

## 2017-06-27 NOTE — Patient Instructions (Signed)
[facility] Discharge Instructions for Patients Receiving Chemotherapy  Today you received the following chemotherapy agents:  Keytruda.  To help prevent nausea and vomiting after your treatment, we encourage you to take your nausea medication as directed.   If you develop nausea and vomiting that is not controlled by your nausea medication, call the clinic.   BELOW ARE SYMPTOMS THAT SHOULD BE REPORTED IMMEDIATELY:  *FEVER GREATER THAN 100.5 F  *CHILLS WITH OR WITHOUT FEVER  NAUSEA AND VOMITING THAT IS NOT CONTROLLED WITH YOUR NAUSEA MEDICATION  *UNUSUAL SHORTNESS OF BREATH  *UNUSUAL BRUISING OR BLEEDING  TENDERNESS IN MOUTH AND THROAT WITH OR WITHOUT PRESENCE OF ULCERS  *URINARY PROBLEMS  *BOWEL PROBLEMS  UNUSUAL RASH Items with * indicate a potential emergency and should be followed up as soon as possible.  Feel free to call the clinic should you have any questions or concerns. The clinic phone number is [phone].  Please show the CHEMO ALERT CARD at check-in to the Emergency Department and triage nurse.    

## 2017-06-27 NOTE — Telephone Encounter (Signed)
Scheduled appt per 5/21 los - pt to get an updated schedule next visit.

## 2017-06-27 NOTE — Progress Notes (Signed)
    [facility] [phone]   [phone]  OFFICE PROGRESS NOTE  Pollock, Nelson, MD [address] [place] [address]  DIAGNOSIS: Stage IV (T1b, N3, M1c) non-small cell lung cancer, squamous cell carcinoma diagnosed in September 2018 presented with left infrahilar mass in addition to mediastinal and supraclavicular lymphadenopathy as well as metastatic brain lesion.  PDL1 expression 0%.  PRIOR THERAPY:  1) Palliative radiotherapy to the brain metastasis. 2)  Systemic chemotherapy with carboplatin for AUC of 5, paclitaxel 175 MG/M2 and Ketruda (pembrolizumab) 200 MG IV every 3 weeks. First dose 11/21/2016.  Status post 4 cycles.  CURRENT THERAPY: Maintenance treatment with single agent Keytruda 200 mg IV every 3 weeks.  First cycle March 14, 2017.  Status post 7 cycles.  INTERVAL HISTORY: Steve Frank 84 y.o. male returns to the clinic today for follow-up visit accompanied by his wife.  The patient is feeling fine today with no specific complaints except for the baseline shortness of breath.  He denied having any chest pain, cough or hemoptysis.  He denied having any fever or chills.  He has no nausea, vomiting, diarrhea or constipation.  He continues to tolerate his treatment with single agent Keytruda fairly well.  He is here today for evaluation before starting cycle #8 of his treatment.   MEDICAL HISTORY: Past Medical History:  Diagnosis Date  . Arthritis   . BPH (benign prostatic hyperplasia)   . CAD (coronary artery disease)    s/p 2 cabg's  . Cancer (HCC)    Basal and squamous cell  skin cancer  . Cerebellar mass   . Chest mass   . Diabetes (HCC)    Type II  . GERD (gastroesophageal reflux disease)   . Head injury with loss of consciousness (HCC)    age 8 or 9-   . Heart attack (HCC)   . HLD (hyperlipidemia)   . Hypertension   . Macular degeneration   . Pneumonia     x 2 last 1071  . Stage IV squamous cell carcinoma of  left lung (HCC) 10/20/2016    ALLERGIES:  is allergic to ciprofloxacin.  MEDICATIONS:  Current Outpatient Medications  Medication Sig Dispense Refill  . acetaminophen (TYLENOL) 325 MG tablet Take 650 mg by mouth every 6 (six) hours as needed (for pain/headaches.).     . albuterol (PROVENTIL HFA;VENTOLIN HFA) 108 (90 Base) MCG/ACT inhaler Inhale 2 puffs into the lungs every 6 (six) hours as needed (for wheezing/shortness of breath).     . amLODipine (NORVASC) 5 MG tablet Take by mouth.    . B-D INS SYR ULTRAFINE 1CC/31G 31G X 5/16" 1 ML MISC     . bisacodyl (DULCOLAX) 10 MG suppository Place rectally.    . carvedilol (COREG) 25 MG tablet Take by mouth.    . Cholecalciferol (VITAMIN D-1000 MAX ST) 1000 units tablet Take 1,000 Units by mouth daily.     . clotrimazole-betamethasone (LOTRISONE) cream Apply 1 application topically 2 (two) times daily.    . docusate sodium (COLACE) 100 MG capsule Take 100 mg by mouth 2 (two) times daily as needed (for constipation.).    . furosemide (LASIX) 20 MG tablet Take by mouth.    . gabapentin (NEURONTIN) 400 MG capsule Take 400 mg by mouth at bedtime.    . guaiFENesin-dextromethorphan (ROBITUSSIN DM) 100-10 MG/5ML syrup Take 15 mLs every 4 (four) hours as needed by mouth for cough.    . HUMALOG 100 UNIT/ML   injection INJECT 2 TO 0.12MLS (2-8 UNITS) UNDER THE SKIN 3 TIMES A DAY BEFORE MEALS, AS PER SLIDING SCALE  0  . Lancets (FREESTYLE) lancets     . LANTUS 100 UNIT/ML injection INJECT 0.15ML (15 UNITS TOTAL) UNDER THE SKIN DAILY AT BEDTIME.  0  . lidocaine-prilocaine (EMLA) cream Apply 1 application topically as needed. 30 g 0  . magnesium hydroxide (MILK OF MAGNESIA) 400 MG/5ML suspension Take 30 mLs by mouth daily as needed (for constipation.).    . mirtazapine (REMERON) 15 MG tablet Take 15 mg by mouth at bedtime.  0  . Multiple Vitamin (MULTIVITAMIN WITH MINERALS) TABS tablet Take 1 tablet by mouth daily.    . neomycin-bacitracin-polymyxin (NEOSPORIN)  5-400-5000 ointment Apply 1 application topically at bedtime.    . omeprazole (PRILOSEC) 20 MG capsule Take 20 mg by mouth at bedtime.     . polyethylene glycol (MIRALAX / GLYCOLAX) packet Take 17 g by mouth daily as needed (for constipation.).     . prochlorperazine (COMPAZINE) 10 MG tablet Take 1 tablet (10 mg total) by mouth every 6 (six) hours as needed for nausea or vomiting. 30 tablet 0  . rosuvastatin (CRESTOR) 20 MG tablet Take 20 mg by mouth daily.    . Sodium Phosphates (ENEMA) 7-19 GM/118ML ENEM Place rectally.    . tamsulosin (FLOMAX) 0.4 MG CAPS capsule Take 1 capsule by mouth once.    . triamcinolone (KENALOG) 0.025 % ointment Apply 1 application 2 (two) times daily topically. 30 g 1   No current facility-administered medications for this visit.     SURGICAL HISTORY:  Past Surgical History:  Procedure Laterality Date  . CARDIAC CATHETERIZATION    . COLONOSCOPY W/ POLYPECTOMY    . CORONARY ARTERY BYPASS GRAFT     Dr Van Trigt [facility] 07/2007  . CORONARY ARTERY BYPASS GRAFT     1st one was @ [facility] 26years ago 12/1991  . CRANIECTOMY FOR EXCISION OF BRAIN TUMOR INFRATENTORIAL / POSTERIOR FOSSA  08/05/2016   Dr Renata Kilpatrick  @ [facility]/Neurosurgery  . FLEXIBLE BRONCHOSCOPY  08/07/2016   Dr Wayne Beauford  . IR FLUORO GUIDE PORT INSERTION RIGHT  11/18/2016  . IR THORACENTESIS ASP PLEURAL SPACE W/IMG GUIDE  03/09/2017  . IR US GUIDE VASC ACCESS RIGHT  11/18/2016  . LUNG BIOPSY N/A 10/14/2016   Procedure: LUNG BIOPSY;  Surgeon: Gerhardt, Edward B, MD;  Location: [facility] OR;  Service: Thoracic;  Laterality: N/A;  . VIDEO BRONCHOSCOPY WITH ENDOBRONCHIAL ULTRASOUND N/A 10/14/2016   Procedure: VIDEO BRONCHOSCOPY WITH ENDOBRONCHIAL ULTRASOUND;  Surgeon: Gerhardt, Edward B, MD;  Location: [facility] OR;  Service: Thoracic;  Laterality: N/A;    REVIEW OF SYSTEMS:  A comprehensive review of systems was negative except for: Constitutional: positive for fatigue Respiratory: positive for dyspnea on  exertion   PHYSICAL EXAMINATION: General appearance: alert, cooperative, fatigued and no distress Head: Normocephalic, without obvious abnormality, atraumatic Neck: no adenopathy, no JVD, supple, symmetrical, trachea midline and thyroid not enlarged, symmetric, no tenderness/mass/nodules Lymph nodes: Cervical, supraclavicular, and axillary nodes normal. Resp: clear to auscultation bilaterally Back: symmetric, no curvature. ROM normal. No CVA tenderness. Cardio: regular rate and rhythm, S1, S2 normal, no murmur, click, rub or gallop GI: soft, non-tender; bowel sounds normal; no masses,  no organomegaly Extremities: extremities normal, atraumatic, no cyanosis or edema  ECOG PERFORMANCE STATUS: 1 - Symptomatic but completely ambulatory  Blood pressure 111/64, pulse (!) 54, temperature 97.6 F (36.4 C), temperature source Oral, height 5' 11" (1.803 m), weight   157 lb 3.2 oz (71.3 kg), SpO2 97 %.  LABORATORY DATA: Lab Results  Component Value Date   WBC 7.9 06/27/2017   HGB 12.4 (L) 06/27/2017   HCT 37.2 (L) 06/27/2017   MCV 99.4 (H) 06/27/2017   PLT 135 (L) 06/27/2017      Chemistry      Component Value Date/Time   NA 139 06/06/2017 1101   NA 138 02/08/2017 1118   K 4.3 06/06/2017 1101   K 4.5 02/08/2017 1118   CL 106 06/06/2017 1101   CO2 26 06/06/2017 1101   CO2 24 02/08/2017 1118   BUN 25 06/06/2017 1101   BUN 16.4 02/08/2017 1118   CREATININE 0.83 06/06/2017 1101   CREATININE 0.9 02/08/2017 1118      Component Value Date/Time   CALCIUM 9.4 06/06/2017 1101   CALCIUM 9.0 02/08/2017 1118   ALKPHOS 61 06/06/2017 1101   ALKPHOS 72 02/08/2017 1118   AST 20 06/06/2017 1101   AST 19 02/08/2017 1118   ALT 13 06/06/2017 1101   ALT 15 02/08/2017 1118   BILITOT 0.4 06/06/2017 1101   BILITOT 0.53 02/08/2017 1118       RADIOGRAPHIC STUDIES: No results found.  ASSESSMENT AND PLAN: This is a very pleasant 84 years old white male with metastatic non-small cell lung cancer,  squamous cell carcinoma with negative PDL 1 expression and enlarging solitary brain metastasis.  The patient completed palliative radiotherapy to the brain metastasis. He completed systemic chemotherapy with carboplatin, paclitaxel and Keytruda status post 4 cycles.  He has no evidence for disease progression with this treatment. The patient is currently undergoing treatment with single agent Keytruda status post 7 cycles.   He continues to tolerate his treatment with immunotherapy fairly well.  I recommended for him to proceed with cycle #8 today as a scheduled. I will see him back for follow-up visit in 3 weeks for evaluation with repeat CT scan of the chest, abdomen and pelvis for restaging of his disease before starting cycle #9. The patient was advised to call immediately if he has any concerning symptoms in the interval. The patient voices understanding of current disease status and treatment options and is in agreement with the current care plan. All questions were answered. The patient knows to call the clinic with any problems, questions or concerns. We can certainly see the patient much sooner if necessary.  Disclaimer: This note was dictated with voice recognition software. Similar sounding words can inadvertently be transcribed and may not be corrected upon review.

## 2017-07-17 ENCOUNTER — Ambulatory Visit (HOSPITAL_COMMUNITY)
Admission: RE | Admit: 2017-07-17 | Discharge: 2017-07-17 | Disposition: A | Discharge status: Home / Self Care | Payer: Medicare Other | Source: Ambulatory Visit | Attending: Internal Medicine | Admitting: Internal Medicine

## 2017-07-17 DIAGNOSIS — J9811 Atelectasis: Secondary | ICD-10-CM | POA: Insufficient documentation

## 2017-07-17 DIAGNOSIS — C3490 Malignant neoplasm of unspecified part of unspecified bronchus or lung: Secondary | ICD-10-CM | POA: Insufficient documentation

## 2017-07-17 DIAGNOSIS — J90 Pleural effusion, not elsewhere classified: Secondary | ICD-10-CM | POA: Insufficient documentation

## 2017-07-17 MED ORDER — HEPARIN SOD (PORK) LOCK FLUSH 100 UNIT/ML IV SOLN: 500.0000 [IU] | Freq: Once | INTRAVENOUS | Status: DC

## 2017-07-17 MED ORDER — IOPAMIDOL (ISOVUE-300) INJECTION 61%
100.0000 mL | Freq: Once | INTRAVENOUS | Status: AC | PRN
  Administered 2017-07-17: 100 mL via INTRAVENOUS

## 2017-07-17 MED ORDER — IOPAMIDOL (ISOVUE-300) INJECTION 61%
INTRAVENOUS | Status: AC
  Filled 2017-07-17: qty 100

## 2017-07-17 MED ORDER — HEPARIN SOD (PORK) LOCK FLUSH 100 UNIT/ML IV SOLN
INTRAVENOUS | Status: AC
  Filled 2017-07-17: qty 5

## 2017-07-18 ENCOUNTER — Inpatient Hospital Stay: Payer: Medicare Other | Attending: Internal Medicine

## 2017-07-18 ENCOUNTER — Encounter: Payer: Self-pay | Admitting: Internal Medicine

## 2017-07-18 ENCOUNTER — Telehealth: Payer: Self-pay | Admitting: Internal Medicine

## 2017-07-18 ENCOUNTER — Inpatient Hospital Stay (HOSPITAL_BASED_OUTPATIENT_CLINIC_OR_DEPARTMENT_OTHER): Payer: Medicare Other | Admitting: Internal Medicine

## 2017-07-18 ENCOUNTER — Inpatient Hospital Stay: Payer: Medicare Other

## 2017-07-18 VITALS — BP 118/58 | HR 51 | Temp 97.6°F | Resp 18 | Ht 71.0 in | Wt 157.9 lb

## 2017-07-18 DIAGNOSIS — C3412 Malignant neoplasm of upper lobe, left bronchus or lung: Secondary | ICD-10-CM

## 2017-07-18 DIAGNOSIS — C778 Secondary and unspecified malignant neoplasm of lymph nodes of multiple regions: Secondary | ICD-10-CM | POA: Insufficient documentation

## 2017-07-18 DIAGNOSIS — Z79899 Other long term (current) drug therapy: Secondary | ICD-10-CM | POA: Insufficient documentation

## 2017-07-18 DIAGNOSIS — I10 Essential (primary) hypertension: Secondary | ICD-10-CM | POA: Insufficient documentation

## 2017-07-18 DIAGNOSIS — C7931 Secondary malignant neoplasm of brain: Secondary | ICD-10-CM

## 2017-07-18 DIAGNOSIS — Z5112 Encounter for antineoplastic immunotherapy: Secondary | ICD-10-CM | POA: Insufficient documentation

## 2017-07-18 DIAGNOSIS — C3492 Malignant neoplasm of unspecified part of left bronchus or lung: Secondary | ICD-10-CM

## 2017-07-18 DIAGNOSIS — R5382 Chronic fatigue, unspecified: Secondary | ICD-10-CM

## 2017-07-18 DIAGNOSIS — Z95828 Presence of other vascular implants and grafts: Secondary | ICD-10-CM

## 2017-07-18 LAB — CMP (CANCER CENTER ONLY)
ALBUMIN: 3.2 g/dL — AB (ref 3.5–5.0)
ALT: 14 U/L (ref 0–55)
AST: 19 U/L (ref 5–34)
Alkaline Phosphatase: 54 U/L (ref 40–150)
Anion gap: 7 (ref 3–11)
BUN: 25 mg/dL (ref 7–26)
CHLORIDE: 105 mmol/L (ref 98–109)
CO2: 27 mmol/L (ref 22–29)
Calcium: 9.1 mg/dL (ref 8.4–10.4)
Creatinine: 0.86 mg/dL (ref 0.70–1.30)
GFR, Est AFR Am: >60 mL/min (ref >60)
GFR, Estimated: >60 mL/min (ref >60)
GLUCOSE: 166 mg/dL — AB (ref 70–140)
POTASSIUM: 4.3 mmol/L (ref 3.5–5.1)
Sodium: 139 mmol/L (ref 136–145)
Total Bilirubin: 0.4 mg/dL (ref 0.2–1.2)
Total Protein: 6.2 g/dL — ABNORMAL LOW (ref 6.4–8.3)

## 2017-07-18 LAB — CBC WITH DIFFERENTIAL (CANCER CENTER ONLY)
Basophils Absolute: 0 10*3/uL (ref 0.0–0.1)
Basophils Relative: 1 %
Eosinophils Absolute: 0.5 10*3/uL (ref 0.0–0.5)
Eosinophils Relative: 8 %
HCT: 37.1 % — ABNORMAL LOW (ref 38.4–49.9)
Hemoglobin: 12.2 g/dL — ABNORMAL LOW (ref 13.0–17.1)
LYMPHS ABS: 0.6 10*3/uL — AB (ref 0.9–3.3)
LYMPHS PCT: 9 %
MCH: 32.8 pg (ref 27.2–33.4)
MCHC: 32.9 g/dL (ref 32.0–36.0)
MCV: 99.7 fL — ABNORMAL HIGH (ref 79.3–98.0)
MONO ABS: 0.6 10*3/uL (ref 0.1–0.9)
MONOS PCT: 9 %
Neutro Abs: 5 10*3/uL (ref 1.5–6.5)
Neutrophils Relative %: 73 %
PLATELETS: 122 10*3/uL — AB (ref 140–400)
RBC: 3.72 MIL/uL — ABNORMAL LOW (ref 4.20–5.82)
RDW: 15.2 % — AB (ref 11.0–14.6)
WBC Count: 6.8 10*3/uL (ref 4.0–10.3)

## 2017-07-18 LAB — TSH: TSH: 1.267 u[IU]/mL (ref 0.320–4.118)

## 2017-07-18 MED ORDER — SODIUM CHLORIDE 0.9 % IV SOLN
Freq: Once | INTRAVENOUS | Status: AC
  Administered 2017-07-18: 12:00:00 via INTRAVENOUS

## 2017-07-18 MED ORDER — SODIUM CHLORIDE 0.9 % IV SOLN
200.0000 mg | Freq: Once | INTRAVENOUS | Status: AC
  Administered 2017-07-18: 200 mg via INTRAVENOUS
  Filled 2017-07-18: qty 8

## 2017-07-18 MED ORDER — HEPARIN SOD (PORK) LOCK FLUSH 100 UNIT/ML IV SOLN
500.0000 [IU] | Freq: Once | INTRAVENOUS | Status: AC | PRN
  Administered 2017-07-18: 500 [IU]
  Filled 2017-07-18: qty 5

## 2017-07-18 MED ORDER — SODIUM CHLORIDE 0.9% FLUSH
10.0000 mL | Freq: Once | INTRAVENOUS | Status: AC
  Administered 2017-07-18: 10 mL
  Filled 2017-07-18: qty 10

## 2017-07-18 MED ORDER — SODIUM CHLORIDE 0.9% FLUSH
10.0000 mL | INTRAVENOUS | Status: DC | PRN
  Administered 2017-07-18: 10 mL
  Filled 2017-07-18: qty 10

## 2017-07-18 NOTE — Patient Instructions (Signed)
[facility] Discharge Instructions for Patients Receiving Chemotherapy  Today you received the following chemotherapy agents:  Keytruda.  To help prevent nausea and vomiting after your treatment, we encourage you to take your nausea medication as directed.   If you develop nausea and vomiting that is not controlled by your nausea medication, call the clinic.   BELOW ARE SYMPTOMS THAT SHOULD BE REPORTED IMMEDIATELY:  *FEVER GREATER THAN 100.5 F  *CHILLS WITH OR WITHOUT FEVER  NAUSEA AND VOMITING THAT IS NOT CONTROLLED WITH YOUR NAUSEA MEDICATION  *UNUSUAL SHORTNESS OF BREATH  *UNUSUAL BRUISING OR BLEEDING  TENDERNESS IN MOUTH AND THROAT WITH OR WITHOUT PRESENCE OF ULCERS  *URINARY PROBLEMS  *BOWEL PROBLEMS  UNUSUAL RASH Items with * indicate a potential emergency and should be followed up as soon as possible.  Feel free to call the clinic should you have any questions or concerns. The clinic phone number is [phone].  Please show the CHEMO ALERT CARD at check-in to the Emergency Department and triage nurse.    

## 2017-07-18 NOTE — Progress Notes (Signed)
[facility] [phone]   [phone]  OFFICE PROGRESS NOTE  [name], MD [address] [place] [address]  DIAGNOSIS: Stage IV (T1b, N3, M1c) non-small cell lung cancer, squamous cell carcinoma diagnosed in September 2018 presented with left infrahilar mass in addition to mediastinal and supraclavicular lymphadenopathy as well as metastatic brain lesion.  PDL1 expression 0%.  PRIOR THERAPY:  1) Palliative radiotherapy to the brain metastasis. 2)  Systemic chemotherapy with carboplatin for AUC of 5, paclitaxel 175 MG/M2 and Ketruda (pembrolizumab) 200 MG IV every 3 weeks. First dose 11/21/2016.  Status post 4 cycles.  CURRENT THERAPY: Maintenance treatment with single agent Keytruda 200 mg IV every 3 weeks.  First cycle March 14, 2017.  Status post 8 cycles.  INTERVAL HISTORY: BRAVLIO LUCA 84 y.o. male returns to the clinic today for follow-up visit accompanied by his wife.  The patient is feeling fine today with no specific complaints except for the baseline shortness of breath.  He denied having any chest pain, cough or hemoptysis.  He denied having any recent weight loss or night sweats.  He has no nausea, vomiting, diarrhea or constipation.  The patient has no fever or chills.  He denied having any headache or visual changes.  He continues to tolerate his maintenance treatment with Keytruda fairly well.  He had repeat CT scan of the chest, abdomen and pelvis performed recently and he is here for evaluation and discussion of his discuss results.   MEDICAL HISTORY: Past Medical History:  Diagnosis Date  . Arthritis   . BPH (benign prostatic hyperplasia)   . CAD (coronary artery disease)    s/p 2 cabg's  . Cancer (HCC)    Basal and squamous cell  skin cancer  . Cerebellar mass   . Chest mass   . Diabetes ([place])    Type II  . GERD (gastroesophageal reflux disease)   . Head injury with loss of consciousness [facility])    age 84 or  7-   . Heart attack ([place])   . HLD (hyperlipidemia)   . Hypertension   . Macular degeneration   . Pneumonia     x 2 last 1071  . Stage IV squamous cell carcinoma of left lung ([place]) 10/20/2016    ALLERGIES:  is allergic to ciprofloxacin.  MEDICATIONS:  Current Outpatient Medications  Medication Sig Dispense Refill  . acetaminophen (TYLENOL) 325 MG tablet Take 650 mg by mouth every 6 (six) hours as needed (for pain/headaches.).     Marland Kitchen albuterol (PROVENTIL HFA;VENTOLIN HFA) 108 (90 Base) MCG/ACT inhaler Inhale 2 puffs into the lungs every 6 (six) hours as needed (for wheezing/shortness of breath).     . B-D INS SYR ULTRAFINE 1CC/31G 31G X 5/16" 1 ML MISC     . bisacodyl (DULCOLAX) 10 MG suppository Place rectally.    . carvedilol (COREG) 25 MG tablet Take by mouth.    . Cholecalciferol (VITAMIN D-1000 MAX ST) 1000 units tablet Take 1,000 Units by mouth daily.     . clotrimazole-betamethasone (LOTRISONE) cream Apply 1 application topically 2 (two) times daily.    Marland Kitchen docusate sodium (COLACE) 100 MG capsule Take 100 mg by mouth 2 (two) times daily as needed (for constipation.).    Marland Kitchen furosemide (LASIX) 20 MG tablet Take by mouth.    . gabapentin (NEURONTIN) 400 MG capsule Take 400 mg by mouth at bedtime.    Marland Kitchen guaiFENesin-dextromethorphan (ROBITUSSIN DM) 100-10 MG/5ML syrup Take  15 mLs every 4 (four) hours as needed by mouth for cough.    Marland Kitchen HUMALOG 100 UNIT/ML injection INJECT 2 TO 0.12MLS (2-8 UNITS) UNDER THE SKIN 3 TIMES A DAY BEFORE MEALS, AS PER SLIDING SCALE  0  . Lancets (FREESTYLE) lancets     . LANTUS 100 UNIT/ML injection INJECT 0.15ML (15 UNITS TOTAL) UNDER THE SKIN DAILY AT BEDTIME.  0  . lidocaine-prilocaine (EMLA) cream Apply 1 application topically as needed. 30 g 0  . magnesium hydroxide (MILK OF MAGNESIA) 400 MG/5ML suspension Take 30 mLs by mouth daily as needed (for constipation.).    Marland Kitchen mirtazapine (REMERON) 15 MG tablet Take 15 mg by mouth at bedtime.  0  . Multiple Vitamin  (MULTIVITAMIN WITH MINERALS) TABS tablet Take 1 tablet by mouth daily.    Marland Kitchen neomycin-bacitracin-polymyxin (NEOSPORIN) [phone] ointment Apply 1 application topically at bedtime.    Marland Kitchen omeprazole (PRILOSEC) 20 MG capsule Take 20 mg by mouth at bedtime.     . polyethylene glycol (MIRALAX / GLYCOLAX) packet Take 17 g by mouth daily as needed (for constipation.).     Marland Kitchen rosuvastatin (CRESTOR) 20 MG tablet Take 20 mg by mouth daily.    . Sodium Phosphates (ENEMA) 7-19 GM/118ML ENEM Place rectally.    . tamsulosin (FLOMAX) 0.4 MG CAPS capsule Take 1 capsule by mouth once.    . triamcinolone (KENALOG) 0.025 % ointment Apply 1 application 2 (two) times daily topically. 30 g 1  . prochlorperazine (COMPAZINE) 10 MG tablet Take 1 tablet (10 mg total) by mouth every 6 (six) hours as needed for nausea or vomiting. (Patient not taking: Reported on 07/18/2017) 30 tablet 0   No current facility-administered medications for this visit.     SURGICAL HISTORY:  Past Surgical History:  Procedure Laterality Date  . CARDIAC CATHETERIZATION    . COLONOSCOPY W/ POLYPECTOMY    . CORONARY ARTERY BYPASS GRAFT     Dr Prescott Gum [facility] 07/2007  . CORONARY ARTERY BYPASS GRAFT     1st one was @ [facility] 26years ago 12/1991  . CRANIECTOMY FOR EXCISION OF BRAIN TUMOR INFRATENTORIAL / POSTERIOR FOSSA  08/05/2016   Dr Rebecka Apley  @ [facility]/Neurosurgery  . FLEXIBLE BRONCHOSCOPY  08/07/2016   Dr Gwenevere Ghazi  . IR FLUORO GUIDE PORT INSERTION RIGHT  11/18/2016  . IR THORACENTESIS ASP PLEURAL SPACE W/IMG GUIDE  03/09/2017  . IR US GUIDE VASC ACCESS RIGHT  11/18/2016  . LUNG BIOPSY N/A 10/14/2016   Procedure: LUNG BIOPSY;  Surgeon: Grace Isaac, MD;  Location: [facility];  Service: Thoracic;  Laterality: N/A;  . VIDEO BRONCHOSCOPY WITH ENDOBRONCHIAL ULTRASOUND N/A 10/14/2016   Procedure: VIDEO BRONCHOSCOPY WITH ENDOBRONCHIAL ULTRASOUND;  Surgeon: Grace Isaac, MD;  Location: [facility];  Service: Thoracic;  Laterality: N/A;     REVIEW OF SYSTEMS:  Constitutional: positive for fatigue Eyes: negative Ears, nose, mouth, throat, and face: negative Respiratory: positive for dyspnea on exertion Cardiovascular: negative Gastrointestinal: negative Genitourinary:negative Integument/breast: negative Hematologic/lymphatic: negative Musculoskeletal:negative Neurological: negative Behavioral/Psych: negative Endocrine: negative Allergic/Immunologic: negative   PHYSICAL EXAMINATION: General appearance: alert, cooperative, fatigued and no distress Head: Normocephalic, without obvious abnormality, atraumatic Neck: no adenopathy, no JVD, supple, symmetrical, trachea midline and thyroid not enlarged, symmetric, no tenderness/mass/nodules Lymph nodes: Cervical, supraclavicular, and axillary nodes normal. Resp: clear to auscultation bilaterally Back: symmetric, no curvature. ROM normal. No CVA tenderness. Cardio: regular rate and rhythm, S1, S2 normal, no murmur, click, rub or gallop GI: soft, non-tender; bowel sounds normal; no  masses,  no organomegaly Extremities: extremities normal, atraumatic, no cyanosis or edema Neurologic: Alert and oriented X 3, normal strength and tone. Normal symmetric reflexes. Normal coordination and gait  ECOG PERFORMANCE STATUS: 1 - Symptomatic but completely ambulatory  Blood pressure (!) 118/58, pulse (!) 51, temperature 97.6 F (36.4 C), temperature source Oral, resp. rate 18, height _0  (1.803 m), weight 157 lb 14.4 oz (71.6 kg), SpO2 96 %.  LABORATORY DATA: Lab Results  Component Value Date   WBC 6.8 07/18/2017   HGB 12.2 (L) 07/18/2017   HCT 37.1 (L) 07/18/2017   MCV 99.7 (H) 07/18/2017   PLT 122 (L) 07/18/2017      Chemistry      Component Value Date/Time   NA 139 07/18/2017 0958   NA 138 02/08/2017 1118   K 4.3 07/18/2017 0958   K 4.5 02/08/2017 1118   CL 105 07/18/2017 0958   CO2 27 07/18/2017 0958   CO2 24 02/08/2017 1118   BUN 25 07/18/2017 0958   BUN 16.4  02/08/2017 1118   CREATININE 0.86 07/18/2017 0958   CREATININE 0.9 02/08/2017 1118      Component Value Date/Time   CALCIUM 9.1 07/18/2017 0958   CALCIUM 9.0 02/08/2017 1118   ALKPHOS 54 07/18/2017 0958   ALKPHOS 72 02/08/2017 1118   AST 19 07/18/2017 0958   AST 19 02/08/2017 1118   ALT 14 07/18/2017 0958   ALT 15 02/08/2017 1118   BILITOT 0.4 07/18/2017 0958   BILITOT 0.53 02/08/2017 1118       RADIOGRAPHIC STUDIES: Ct Chest W Contrast  Result Date: 07/17/2017 CLINICAL DATA:  Metastatic non-small cell lung cancer (squamous cell carcinoma) status post palliative radiotherapy for brain metastasis. Completed systemic chemotherapy. Continued immunotherapy. EXAM: CT CHEST, ABDOMEN, AND PELVIS WITH CONTRAST TECHNIQUE: Multidetector CT imaging of the chest, abdomen and pelvis was performed following the standard protocol during bolus administration of intravenous contrast. CONTRAST:  137m ISOVUE-300 IOPAMIDOL (ISOVUE-300) INJECTION 61% COMPARISON:  CT 05/10/2017 and 02/17/2017. FINDINGS: Cardiovascular: Diffuse atherosclerosis of the aorta, great vessels and coronary arteries. No acute vascular findings are demonstrated. There are aortic valvular calcifications. The heart size is normal. There is no pericardial effusion. Mediastinum/Nodes: Stable 10 mm right hilar node on image 29/2 and 15 mm subcarinal node on image 33/2. No progressive mediastinal, hilar or axillary lymphadenopathy. The thyroid gland, trachea and esophagus demonstrate no significant findings. Lungs/Pleura: Moderate size dependent pleural effusions bilaterally have slightly enlarged. These appear simple without nodularity or abnormal enhancement. There is associated dependent atelectasis in both lungs which has mildly increased. There is a component posteriorly in the left upper lobe. Underlying emphysema noted. No suspicious pulmonary nodule. Musculoskeletal/Chest wall: No chest wall mass or suspicious osseous findings. There are  stable chronic compression deformities at T6 and T7. Diffuse changes of diffuse idiopathic skeletal hyperostosis noted. CT ABDOMEN AND PELVIS FINDINGS Hepatobiliary: The liver is normal in density without focal abnormality. No evidence of gallstones, gallbladder wall thickening or biliary dilatation. Pancreas: Unremarkable. No pancreatic ductal dilatation or surrounding inflammatory changes. Spleen: Normal in size without focal abnormality. Adrenals/Urinary Tract: Both adrenal glands appear normal. The kidneys appear stable with small cysts bilaterally. No evidence of renal mass, urinary tract calculus or hydronephrosis. The bladder appears normal. Stomach/Bowel: No evidence of bowel wall thickening, distention or surrounding inflammatory change. Mild sigmoid diverticulosis. There is prominent stool throughout the colon. The appendix appears normal. Vascular/Lymphatic: There are no enlarged abdominal or pelvic lymph nodes. Stable aortic and branch vessel atherosclerosis.  Reproductive: The prostate gland and seminal vesicles appear unremarkable. Other: No evidence of abdominal wall mass or hernia. No ascites. Musculoskeletal: No acute or significant osseous findings. Stable changes of diffuse idiopathic skeletal hyperostosis. IMPRESSION: 1. Slight enlargement of bilateral pleural effusions and associated bibasilar atelectasis. 2. Otherwise stable CTs of the chest, abdomen and pelvis. No evidence of local recurrence or progressive metastatic disease. Previously demonstrated mildly prominent right hilar and subcarinal lymph nodes are stable. 3. Stable incidental findings including diffuse idiopathic skeletal hyperostosis, compression fractures at T6 and T7 and Aortic Atherosclerosis (ICD10-I70.0). Electronically Signed   By: Richardean Sale M.D.   On: 07/17/2017 15:07   Ct Abdomen Pelvis W Contrast  Result Date: 07/17/2017 CLINICAL DATA:  Metastatic non-small cell lung cancer (squamous cell carcinoma) status post  palliative radiotherapy for brain metastasis. Completed systemic chemotherapy. Continued immunotherapy. EXAM: CT CHEST, ABDOMEN, AND PELVIS WITH CONTRAST TECHNIQUE: Multidetector CT imaging of the chest, abdomen and pelvis was performed following the standard protocol during bolus administration of intravenous contrast. CONTRAST:  137m ISOVUE-300 IOPAMIDOL (ISOVUE-300) INJECTION 61% COMPARISON:  CT 05/10/2017 and 02/17/2017. FINDINGS: Cardiovascular: Diffuse atherosclerosis of the aorta, great vessels and coronary arteries. No acute vascular findings are demonstrated. There are aortic valvular calcifications. The heart size is normal. There is no pericardial effusion. Mediastinum/Nodes: Stable 10 mm right hilar node on image 29/2 and 15 mm subcarinal node on image 33/2. No progressive mediastinal, hilar or axillary lymphadenopathy. The thyroid gland, trachea and esophagus demonstrate no significant findings. Lungs/Pleura: Moderate size dependent pleural effusions bilaterally have slightly enlarged. These appear simple without nodularity or abnormal enhancement. There is associated dependent atelectasis in both lungs which has mildly increased. There is a component posteriorly in the left upper lobe. Underlying emphysema noted. No suspicious pulmonary nodule. Musculoskeletal/Chest wall: No chest wall mass or suspicious osseous findings. There are stable chronic compression deformities at T6 and T7. Diffuse changes of diffuse idiopathic skeletal hyperostosis noted. CT ABDOMEN AND PELVIS FINDINGS Hepatobiliary: The liver is normal in density without focal abnormality. No evidence of gallstones, gallbladder wall thickening or biliary dilatation. Pancreas: Unremarkable. No pancreatic ductal dilatation or surrounding inflammatory changes. Spleen: Normal in size without focal abnormality. Adrenals/Urinary Tract: Both adrenal glands appear normal. The kidneys appear stable with small cysts bilaterally. No evidence of renal  mass, urinary tract calculus or hydronephrosis. The bladder appears normal. Stomach/Bowel: No evidence of bowel wall thickening, distention or surrounding inflammatory change. Mild sigmoid diverticulosis. There is prominent stool throughout the colon. The appendix appears normal. Vascular/Lymphatic: There are no enlarged abdominal or pelvic lymph nodes. Stable aortic and branch vessel atherosclerosis. Reproductive: The prostate gland and seminal vesicles appear unremarkable. Other: No evidence of abdominal wall mass or hernia. No ascites. Musculoskeletal: No acute or significant osseous findings. Stable changes of diffuse idiopathic skeletal hyperostosis. IMPRESSION: 1. Slight enlargement of bilateral pleural effusions and associated bibasilar atelectasis. 2. Otherwise stable CTs of the chest, abdomen and pelvis. No evidence of local recurrence or progressive metastatic disease. Previously demonstrated mildly prominent right hilar and subcarinal lymph nodes are stable. 3. Stable incidental findings including diffuse idiopathic skeletal hyperostosis, compression fractures at T6 and T7 and Aortic Atherosclerosis (ICD10-I70.0). Electronically Signed   By: WRichardean SaleM.D.   On: 07/17/2017 15:07    ASSESSMENT AND PLAN: This is a very pleasant 84years old white male with metastatic non-small cell lung cancer, squamous cell carcinoma with negative PDL 1 expression and enlarging solitary brain metastasis.  The patient completed palliative radiotherapy to the brain  metastasis. He completed systemic chemotherapy with carboplatin, paclitaxel and Keytruda status post 4 cycles.  He has no evidence for disease progression with this treatment. The patient is currently undergoing treatment with single agent Keytruda status post 8 cycles.   The patient tolerated the last cycle of his treatment well with no concerning complaints. Repeat CT scan of the chest, abdomen and pelvis were performed recently.  I personally and  independently reviewed the scans and discussed the results with the patient and his wife.  His a scan showed no concerning findings for disease progression. I recommended for the patient to continue his current treatment with [facility] and he will proceed with cycle #9 today. I will see him back for follow-up visit in 3 weeks for evaluation before the next cycle of his treatment. The patient was advised to call immediately if he has any concerning symptoms in the interval. The patient voices understanding of current disease status and treatment options and is in agreement with the current care plan. All questions were answered. The patient knows to call the clinic with any problems, questions or concerns. We can certainly see the patient much sooner if necessary.  Disclaimer: This note was dictated with voice recognition software. Similar sounding words can inadvertently be transcribed and may not be corrected upon review.

## 2017-07-18 NOTE — Telephone Encounter (Signed)
Appointments scheduled AVS/Calendar printed per 6/11 los °

## 2017-08-08 ENCOUNTER — Inpatient Hospital Stay: Payer: Medicare Other | Attending: Internal Medicine

## 2017-08-08 ENCOUNTER — Inpatient Hospital Stay (HOSPITAL_BASED_OUTPATIENT_CLINIC_OR_DEPARTMENT_OTHER): Payer: Medicare Other | Admitting: Internal Medicine

## 2017-08-08 ENCOUNTER — Inpatient Hospital Stay: Payer: Medicare Other

## 2017-08-08 ENCOUNTER — Telehealth: Payer: Self-pay | Admitting: Internal Medicine

## 2017-08-08 ENCOUNTER — Encounter: Payer: Self-pay | Admitting: Internal Medicine

## 2017-08-08 VITALS — BP 106/50 | HR 50 | Temp 97.4°F | Resp 24 | Ht 71.0 in | Wt 150.6 lb

## 2017-08-08 DIAGNOSIS — C3492 Malignant neoplasm of unspecified part of left bronchus or lung: Secondary | ICD-10-CM

## 2017-08-08 DIAGNOSIS — Z5112 Encounter for antineoplastic immunotherapy: Secondary | ICD-10-CM | POA: Diagnosis present

## 2017-08-08 DIAGNOSIS — Z79899 Other long term (current) drug therapy: Secondary | ICD-10-CM

## 2017-08-08 DIAGNOSIS — C778 Secondary and unspecified malignant neoplasm of lymph nodes of multiple regions: Secondary | ICD-10-CM | POA: Diagnosis not present

## 2017-08-08 DIAGNOSIS — R531 Weakness: Secondary | ICD-10-CM | POA: Insufficient documentation

## 2017-08-08 DIAGNOSIS — E119 Type 2 diabetes mellitus without complications: Secondary | ICD-10-CM | POA: Diagnosis not present

## 2017-08-08 DIAGNOSIS — Z951 Presence of aortocoronary bypass graft: Secondary | ICD-10-CM | POA: Insufficient documentation

## 2017-08-08 DIAGNOSIS — I10 Essential (primary) hypertension: Secondary | ICD-10-CM

## 2017-08-08 DIAGNOSIS — C7931 Secondary malignant neoplasm of brain: Secondary | ICD-10-CM | POA: Diagnosis not present

## 2017-08-08 DIAGNOSIS — C3412 Malignant neoplasm of upper lobe, left bronchus or lung: Secondary | ICD-10-CM | POA: Diagnosis not present

## 2017-08-08 DIAGNOSIS — Z95828 Presence of other vascular implants and grafts: Secondary | ICD-10-CM

## 2017-08-08 LAB — CMP (CANCER CENTER ONLY)
ALT: 16 U/L (ref 0–44)
AST: 20 U/L (ref 15–41)
Albumin: 3 g/dL — ABNORMAL LOW (ref 3.5–5.0)
Alkaline Phosphatase: 62 U/L (ref 38–126)
Anion gap: 6 (ref 5–15)
BUN: 25 mg/dL — AB (ref 8–23)
CO2: 28 mmol/L (ref 22–32)
Calcium: 9.2 mg/dL (ref 8.9–10.3)
Chloride: 105 mmol/L (ref 98–111)
Creatinine: 0.82 mg/dL (ref 0.61–1.24)
Glucose, Bld: 164 mg/dL — ABNORMAL HIGH (ref 70–99)
POTASSIUM: 4.2 mmol/L (ref 3.5–5.1)
Sodium: 139 mmol/L (ref 135–145)
Total Bilirubin: 0.4 mg/dL (ref 0.3–1.2)
Total Protein: 6.1 g/dL — ABNORMAL LOW (ref 6.5–8.1)

## 2017-08-08 LAB — CBC WITH DIFFERENTIAL (CANCER CENTER ONLY)
Basophils Absolute: 0 10*3/uL (ref 0.0–0.1)
Basophils Relative: 1 %
Eosinophils Absolute: 0.3 10*3/uL (ref 0.0–0.5)
Eosinophils Relative: 4 %
HCT: 37.5 % — ABNORMAL LOW (ref 38.4–49.9)
HEMOGLOBIN: 12.3 g/dL — AB (ref 13.0–17.1)
LYMPHS ABS: 0.6 10*3/uL — AB (ref 0.9–3.3)
LYMPHS PCT: 9 %
MCH: 32.4 pg (ref 27.2–33.4)
MCHC: 32.8 g/dL (ref 32.0–36.0)
MCV: 98.7 fL — AB (ref 79.3–98.0)
Monocytes Absolute: 0.8 10*3/uL (ref 0.1–0.9)
Monocytes Relative: 11 %
NEUTROS PCT: 75 %
Neutro Abs: 5.3 10*3/uL (ref 1.5–6.5)
Platelet Count: 144 10*3/uL (ref 140–400)
RBC: 3.8 MIL/uL — AB (ref 4.20–5.82)
RDW: 15.3 % — ABNORMAL HIGH (ref 11.0–14.6)
WBC: 7.1 10*3/uL (ref 4.0–10.3)

## 2017-08-08 MED ORDER — SODIUM CHLORIDE 0.9 % IV SOLN
Freq: Once | INTRAVENOUS | Status: AC
  Administered 2017-08-08: 13:00:00 via INTRAVENOUS

## 2017-08-08 MED ORDER — SODIUM CHLORIDE 0.9% FLUSH
10.0000 mL | Freq: Once | INTRAVENOUS | Status: AC
  Administered 2017-08-08: 10 mL
  Filled 2017-08-08: qty 10

## 2017-08-08 MED ORDER — HEPARIN SOD (PORK) LOCK FLUSH 100 UNIT/ML IV SOLN
500.0000 [IU] | Freq: Once | INTRAVENOUS | Status: AC
  Administered 2017-08-08: 500 [IU] via INTRAVENOUS
  Filled 2017-08-08: qty 5

## 2017-08-08 MED ORDER — PEMBROLIZUMAB CHEMO INJECTION 100 MG/4ML
200.0000 mg | Freq: Once | INTRAVENOUS | Status: AC
  Administered 2017-08-08: 200 mg via INTRAVENOUS
  Filled 2017-08-08: qty 8

## 2017-08-08 MED ORDER — SODIUM CHLORIDE 0.9% FLUSH
10.0000 mL | INTRAVENOUS | Status: DC | PRN
  Administered 2017-08-08: 10 mL via INTRAVENOUS
  Filled 2017-08-08: qty 10

## 2017-08-08 NOTE — Patient Instructions (Signed)
[facility] Discharge Instructions for Patients Receiving Chemotherapy  Today you received the following chemotherapy agents [name]   To help prevent nausea and vomiting after your treatment, we encourage you to take your nausea medication as directed  If you develop nausea and vomiting that is not controlled by your nausea medication, call the clinic.   BELOW ARE SYMPTOMS THAT SHOULD BE REPORTED IMMEDIATELY:  *FEVER GREATER THAN 100.5 F  *CHILLS WITH OR WITHOUT FEVER  NAUSEA AND VOMITING THAT IS NOT CONTROLLED WITH YOUR NAUSEA MEDICATION  *UNUSUAL SHORTNESS OF BREATH  *UNUSUAL BRUISING OR BLEEDING  TENDERNESS IN MOUTH AND THROAT WITH OR WITHOUT PRESENCE OF ULCERS  *URINARY PROBLEMS  *BOWEL PROBLEMS  UNUSUAL RASH Items with * indicate a potential emergency and should be followed up as soon as possible.  Feel free to call the clinic you have any questions or concerns. The clinic phone number is [phone].

## 2017-08-08 NOTE — Progress Notes (Signed)
[place] [phone]   [phone]  OFFICE PROGRESS NOTE  [name], MD [address] [place] [address]  DIAGNOSIS: Stage IV (T1b, N3, M1c) non-small cell lung cancer, squamous cell carcinoma diagnosed in September 2018 presented with left infrahilar mass in addition to mediastinal and supraclavicular lymphadenopathy as well as metastatic brain lesion.  PDL1 expression 0%.  PRIOR THERAPY:  1) Palliative radiotherapy to the brain metastasis. 2)  Systemic chemotherapy with carboplatin for AUC of 5, paclitaxel 175 MG/M2 and Ketruda (pembrolizumab) 200 MG IV every 3 weeks. First dose 11/21/2016.  Status post 4 cycles.  CURRENT THERAPY: Maintenance treatment with single agent Keytruda 200 mg IV every 3 weeks.  First cycle March 14, 2017.  Status post 9 cycles.  INTERVAL HISTORY: Steve Frank 84 y.o. male returns to the clinic today for follow-up visit accompanied by his wife.  The patient is feeling fine today with no concerning complaints except for the persistent shortness of breath at baseline increased with exertion.  He denied having any nausea, vomiting, diarrhea or constipation.  He has no chest pain, cough or hemoptysis.  The patient lost few pounds recently but he eats very good.  He denied having any fever or chills.  He is here today for evaluation before starting cycle #10.   MEDICAL HISTORY: Past Medical History:  Diagnosis Date  . Arthritis   . BPH (benign prostatic hyperplasia)   . CAD (coronary artery disease)    s/p 2 cabg's  . Cancer (HCC)    Basal and squamous cell  skin cancer  . Cerebellar mass   . Chest mass   . Diabetes ([place])    Type II  . GERD (gastroesophageal reflux disease)   . Head injury with loss of consciousness [facility])    age 33 or 56-   . Heart attack ([place])   . HLD (hyperlipidemia)   . Hypertension   . Macular degeneration   . Pneumonia     x 2 last 1071  . Stage IV squamous cell carcinoma  of left lung ([place]) 10/20/2016    ALLERGIES:  is allergic to ciprofloxacin.  MEDICATIONS:  Current Outpatient Medications  Medication Sig Dispense Refill  . acetaminophen (TYLENOL) 325 MG tablet Take 650 mg by mouth every 6 (six) hours as needed (for pain/headaches.).     Marland Kitchen albuterol (PROVENTIL HFA;VENTOLIN HFA) 108 (90 Base) MCG/ACT inhaler Inhale 2 puffs into the lungs every 6 (six) hours as needed (for wheezing/shortness of breath).     . B-D INS SYR ULTRAFINE 1CC/31G 31G X 5/16" 1 ML MISC     . bisacodyl (DULCOLAX) 10 MG suppository Place rectally.    . carvedilol (COREG) 25 MG tablet Take by mouth.    . Cholecalciferol (VITAMIN D-1000 MAX ST) 1000 units tablet Take 1,000 Units by mouth daily.     . clotrimazole-betamethasone (LOTRISONE) cream Apply 1 application topically 2 (two) times daily.    Marland Kitchen docusate sodium (COLACE) 100 MG capsule Take 100 mg by mouth 2 (two) times daily as needed (for constipation.).    Marland Kitchen furosemide (LASIX) 20 MG tablet Take by mouth.    . gabapentin (NEURONTIN) 400 MG capsule Take 400 mg by mouth at bedtime.    Marland Kitchen guaiFENesin-dextromethorphan (ROBITUSSIN DM) 100-10 MG/5ML syrup Take 15 mLs every 4 (four) hours as needed by mouth for cough.    Marland Kitchen HUMALOG 100 UNIT/ML injection INJECT 2 TO 0.12MLS (2-8 UNITS) UNDER THE SKIN 3  TIMES A DAY BEFORE MEALS, AS PER SLIDING SCALE  0  . Lancets (FREESTYLE) lancets     . LANTUS 100 UNIT/ML injection INJECT 0.15ML (15 UNITS TOTAL) UNDER THE SKIN DAILY AT BEDTIME.  0  . lidocaine-prilocaine (EMLA) cream Apply 1 application topically as needed. 30 g 0  . magnesium hydroxide (MILK OF MAGNESIA) 400 MG/5ML suspension Take 30 mLs by mouth daily as needed (for constipation.).    Marland Kitchen mirtazapine (REMERON) 15 MG tablet Take 15 mg by mouth at bedtime.  0  . Multiple Vitamin (MULTIVITAMIN WITH MINERALS) TABS tablet Take 1 tablet by mouth daily.    Marland Kitchen neomycin-bacitracin-polymyxin (NEOSPORIN) [phone] ointment Apply 1 application topically  at bedtime.    Marland Kitchen omeprazole (PRILOSEC) 20 MG capsule Take 20 mg by mouth at bedtime.     . polyethylene glycol (MIRALAX / GLYCOLAX) packet Take 17 g by mouth daily as needed (for constipation.).     Marland Kitchen prochlorperazine (COMPAZINE) 10 MG tablet Take 1 tablet (10 mg total) by mouth every 6 (six) hours as needed for nausea or vomiting. (Patient not taking: Reported on 07/18/2017) 30 tablet 0  . rosuvastatin (CRESTOR) 20 MG tablet Take 20 mg by mouth daily.    . Sodium Phosphates (ENEMA) 7-19 GM/118ML ENEM Place rectally.    . tamsulosin (FLOMAX) 0.4 MG CAPS capsule Take 1 capsule by mouth once.    . triamcinolone (KENALOG) 0.025 % ointment Apply 1 application 2 (two) times daily topically. 30 g 1   No current facility-administered medications for this visit.     SURGICAL HISTORY:  Past Surgical History:  Procedure Laterality Date  . CARDIAC CATHETERIZATION    . COLONOSCOPY W/ POLYPECTOMY    . CORONARY ARTERY BYPASS GRAFT     Dr Prescott Gum [facility] 07/2007  . CORONARY ARTERY BYPASS GRAFT     1st one was @ [facility] 26years ago 12/1991  . CRANIECTOMY FOR EXCISION OF BRAIN TUMOR INFRATENTORIAL / POSTERIOR FOSSA  08/05/2016   Dr Rebecka Apley  @ [facility]/Neurosurgery  . FLEXIBLE BRONCHOSCOPY  08/07/2016   Dr Gwenevere Ghazi  . IR FLUORO GUIDE PORT INSERTION RIGHT  11/18/2016  . IR THORACENTESIS ASP PLEURAL SPACE W/IMG GUIDE  03/09/2017  . IR US GUIDE VASC ACCESS RIGHT  11/18/2016  . LUNG BIOPSY N/A 10/14/2016   Procedure: LUNG BIOPSY;  Surgeon: Grace Isaac, MD;  Location: [facility];  Service: Thoracic;  Laterality: N/A;  . VIDEO BRONCHOSCOPY WITH ENDOBRONCHIAL ULTRASOUND N/A 10/14/2016   Procedure: VIDEO BRONCHOSCOPY WITH ENDOBRONCHIAL ULTRASOUND;  Surgeon: Grace Isaac, MD;  Location: [place];  Service: Thoracic;  Laterality: N/A;    REVIEW OF SYSTEMS:  A comprehensive review of systems was negative except for: Constitutional: positive for fatigue Respiratory: positive for dyspnea on exertion    PHYSICAL EXAMINATION: General appearance: alert, cooperative, fatigued and no distress Head: Normocephalic, without obvious abnormality, atraumatic Neck: no adenopathy, no JVD, supple, symmetrical, trachea midline and thyroid not enlarged, symmetric, no tenderness/mass/nodules Lymph nodes: Cervical, supraclavicular, and axillary nodes normal. Resp: clear to auscultation bilaterally Back: symmetric, no curvature. ROM normal. No CVA tenderness. Cardio: regular rate and rhythm, S1, S2 normal, no murmur, click, rub or gallop GI: soft, non-tender; bowel sounds normal; no masses,  no organomegaly Extremities: extremities normal, atraumatic, no cyanosis or edema  ECOG PERFORMANCE STATUS: 1 - Symptomatic but completely ambulatory  Blood pressure (!) 106/50, pulse (!) 50, temperature (!) 97.4 F (36.3 C), temperature source Oral, resp. rate (!) 24, height _0  (1.803 m),  weight 150 lb 9.6 oz (68.3 kg), SpO2 94 %.  LABORATORY DATA: Lab Results  Component Value Date   WBC 6.8 07/18/2017   HGB 12.2 (L) 07/18/2017   HCT 37.1 (L) 07/18/2017   MCV 99.7 (H) 07/18/2017   PLT 122 (L) 07/18/2017      Chemistry      Component Value Date/Time   NA 139 07/18/2017 0958   NA 138 02/08/2017 1118   K 4.3 07/18/2017 0958   K 4.5 02/08/2017 1118   CL 105 07/18/2017 0958   CO2 27 07/18/2017 0958   CO2 24 02/08/2017 1118   BUN 25 07/18/2017 0958   BUN 16.4 02/08/2017 1118   CREATININE 0.86 07/18/2017 0958   CREATININE 0.9 02/08/2017 1118      Component Value Date/Time   CALCIUM 9.1 07/18/2017 0958   CALCIUM 9.0 02/08/2017 1118   ALKPHOS 54 07/18/2017 0958   ALKPHOS 72 02/08/2017 1118   AST 19 07/18/2017 0958   AST 19 02/08/2017 1118   ALT 14 07/18/2017 0958   ALT 15 02/08/2017 1118   BILITOT 0.4 07/18/2017 0958   BILITOT 0.53 02/08/2017 1118       RADIOGRAPHIC STUDIES: Ct Chest W Contrast  Result Date: 07/17/2017 CLINICAL DATA:  Metastatic non-small cell lung cancer (squamous cell  carcinoma) status post palliative radiotherapy for brain metastasis. Completed systemic chemotherapy. Continued immunotherapy. EXAM: CT CHEST, ABDOMEN, AND PELVIS WITH CONTRAST TECHNIQUE: Multidetector CT imaging of the chest, abdomen and pelvis was performed following the standard protocol during bolus administration of intravenous contrast. CONTRAST:  144m ISOVUE-300 IOPAMIDOL (ISOVUE-300) INJECTION 61% COMPARISON:  CT 05/10/2017 and 02/17/2017. FINDINGS: Cardiovascular: Diffuse atherosclerosis of the aorta, great vessels and coronary arteries. No acute vascular findings are demonstrated. There are aortic valvular calcifications. The heart size is normal. There is no pericardial effusion. Mediastinum/Nodes: Stable 10 mm right hilar node on image 29/2 and 15 mm subcarinal node on image 33/2. No progressive mediastinal, hilar or axillary lymphadenopathy. The thyroid gland, trachea and esophagus demonstrate no significant findings. Lungs/Pleura: Moderate size dependent pleural effusions bilaterally have slightly enlarged. These appear simple without nodularity or abnormal enhancement. There is associated dependent atelectasis in both lungs which has mildly increased. There is a component posteriorly in the left upper lobe. Underlying emphysema noted. No suspicious pulmonary nodule. Musculoskeletal/Chest wall: No chest wall mass or suspicious osseous findings. There are stable chronic compression deformities at T6 and T7. Diffuse changes of diffuse idiopathic skeletal hyperostosis noted. CT ABDOMEN AND PELVIS FINDINGS Hepatobiliary: The liver is normal in density without focal abnormality. No evidence of gallstones, gallbladder wall thickening or biliary dilatation. Pancreas: Unremarkable. No pancreatic ductal dilatation or surrounding inflammatory changes. Spleen: Normal in size without focal abnormality. Adrenals/Urinary Tract: Both adrenal glands appear normal. The kidneys appear stable with small cysts  bilaterally. No evidence of renal mass, urinary tract calculus or hydronephrosis. The bladder appears normal. Stomach/Bowel: No evidence of bowel wall thickening, distention or surrounding inflammatory change. Mild sigmoid diverticulosis. There is prominent stool throughout the colon. The appendix appears normal. Vascular/Lymphatic: There are no enlarged abdominal or pelvic lymph nodes. Stable aortic and branch vessel atherosclerosis. Reproductive: The prostate gland and seminal vesicles appear unremarkable. Other: No evidence of abdominal wall mass or hernia. No ascites. Musculoskeletal: No acute or significant osseous findings. Stable changes of diffuse idiopathic skeletal hyperostosis. IMPRESSION: 1. Slight enlargement of bilateral pleural effusions and associated bibasilar atelectasis. 2. Otherwise stable CTs of the chest, abdomen and pelvis. No evidence of local recurrence or progressive  metastatic disease. Previously demonstrated mildly prominent right hilar and subcarinal lymph nodes are stable. 3. Stable incidental findings including diffuse idiopathic skeletal hyperostosis, compression fractures at T6 and T7 and Aortic Atherosclerosis (ICD10-I70.0). Electronically Signed   By: Richardean Sale M.D.   On: 07/17/2017 15:07   Ct Abdomen Pelvis W Contrast  Result Date: 07/17/2017 CLINICAL DATA:  Metastatic non-small cell lung cancer (squamous cell carcinoma) status post palliative radiotherapy for brain metastasis. Completed systemic chemotherapy. Continued immunotherapy. EXAM: CT CHEST, ABDOMEN, AND PELVIS WITH CONTRAST TECHNIQUE: Multidetector CT imaging of the chest, abdomen and pelvis was performed following the standard protocol during bolus administration of intravenous contrast. CONTRAST:  165m ISOVUE-300 IOPAMIDOL (ISOVUE-300) INJECTION 61% COMPARISON:  CT 05/10/2017 and 02/17/2017. FINDINGS: Cardiovascular: Diffuse atherosclerosis of the aorta, great vessels and coronary arteries. No acute vascular  findings are demonstrated. There are aortic valvular calcifications. The heart size is normal. There is no pericardial effusion. Mediastinum/Nodes: Stable 10 mm right hilar node on image 29/2 and 15 mm subcarinal node on image 33/2. No progressive mediastinal, hilar or axillary lymphadenopathy. The thyroid gland, trachea and esophagus demonstrate no significant findings. Lungs/Pleura: Moderate size dependent pleural effusions bilaterally have slightly enlarged. These appear simple without nodularity or abnormal enhancement. There is associated dependent atelectasis in both lungs which has mildly increased. There is a component posteriorly in the left upper lobe. Underlying emphysema noted. No suspicious pulmonary nodule. Musculoskeletal/Chest wall: No chest wall mass or suspicious osseous findings. There are stable chronic compression deformities at T6 and T7. Diffuse changes of diffuse idiopathic skeletal hyperostosis noted. CT ABDOMEN AND PELVIS FINDINGS Hepatobiliary: The liver is normal in density without focal abnormality. No evidence of gallstones, gallbladder wall thickening or biliary dilatation. Pancreas: Unremarkable. No pancreatic ductal dilatation or surrounding inflammatory changes. Spleen: Normal in size without focal abnormality. Adrenals/Urinary Tract: Both adrenal glands appear normal. The kidneys appear stable with small cysts bilaterally. No evidence of renal mass, urinary tract calculus or hydronephrosis. The bladder appears normal. Stomach/Bowel: No evidence of bowel wall thickening, distention or surrounding inflammatory change. Mild sigmoid diverticulosis. There is prominent stool throughout the colon. The appendix appears normal. Vascular/Lymphatic: There are no enlarged abdominal or pelvic lymph nodes. Stable aortic and branch vessel atherosclerosis. Reproductive: The prostate gland and seminal vesicles appear unremarkable. Other: No evidence of abdominal wall mass or hernia. No ascites.  Musculoskeletal: No acute or significant osseous findings. Stable changes of diffuse idiopathic skeletal hyperostosis. IMPRESSION: 1. Slight enlargement of bilateral pleural effusions and associated bibasilar atelectasis. 2. Otherwise stable CTs of the chest, abdomen and pelvis. No evidence of local recurrence or progressive metastatic disease. Previously demonstrated mildly prominent right hilar and subcarinal lymph nodes are stable. 3. Stable incidental findings including diffuse idiopathic skeletal hyperostosis, compression fractures at T6 and T7 and Aortic Atherosclerosis (ICD10-I70.0). Electronically Signed   By: WRichardean SaleM.D.   On: 07/17/2017 15:07    ASSESSMENT AND PLAN: This is a very pleasant 84years old white male with metastatic non-small cell lung cancer, squamous cell carcinoma with negative PDL 1 expression and enlarging solitary brain metastasis.  The patient completed palliative radiotherapy to the brain metastasis. He completed systemic chemotherapy with carboplatin, paclitaxel and Keytruda status post 4 cycles.  He has no evidence for disease progression with this treatment. The patient is currently undergoing treatment with single agent Keytruda status post 9 cycles.   The patient continues to tolerate this treatment fairly well with no concerning complaints. I recommended for him to proceed with cycle #10  today. He will come back for follow-up visit in 3 weeks for evaluation before starting cycle #11. The patient was advised to call immediately if he has any concerning symptoms in the interval. The patient voices understanding of current disease status and treatment options and is in agreement with the current care plan. All questions were answered. The patient knows to call the clinic with any problems, questions or concerns. We can certainly see the patient much sooner if necessary.  Disclaimer: This note was dictated with voice recognition software. Similar sounding words  can inadvertently be transcribed and may not be corrected upon review.

## 2017-08-08 NOTE — Telephone Encounter (Signed)
3 cycles already scheduled per 7/2 los. - no additional apts added./

## 2017-08-17 ENCOUNTER — Ambulatory Visit
Admission: RE | Admit: 2017-08-17 | Discharge: 2017-08-17 | Disposition: A | Discharge status: Home / Self Care | Payer: Medicare Other | Source: Ambulatory Visit | Attending: Radiation Oncology | Admitting: Radiation Oncology

## 2017-08-17 DIAGNOSIS — C7949 Secondary malignant neoplasm of other parts of nervous system: Principal | ICD-10-CM

## 2017-08-17 DIAGNOSIS — C7931 Secondary malignant neoplasm of brain: Secondary | ICD-10-CM

## 2017-08-17 MED ORDER — GADOBENATE DIMEGLUMINE 529 MG/ML IV SOLN
15.0000 mL | Freq: Once | INTRAVENOUS | Status: AC | PRN
  Administered 2017-08-17: 15 mL via INTRAVENOUS

## 2017-08-22 ENCOUNTER — Encounter: Payer: Self-pay | Admitting: *Deleted

## 2017-08-22 ENCOUNTER — Ambulatory Visit
Admission: RE | Admit: 2017-08-22 | Discharge: 2017-08-22 | Disposition: A | Discharge status: Home / Self Care | Payer: Medicare Other | Source: Ambulatory Visit | Attending: Urology | Admitting: Urology

## 2017-08-22 DIAGNOSIS — C7931 Secondary malignant neoplasm of brain: Secondary | ICD-10-CM

## 2017-08-22 NOTE — Progress Notes (Signed)
29 Spoke with Mrs. Agard she stated " she called at 1300 to cancel her husband's appointment for 1400 and spoke with the operator to let us know her husband was taken to [facility] by ambulance with breathing problems at 1300 thoday.   I replied I hope he is resting and breathing better now and I will let Ashlyn Bruning know what is going on with your husband.   Explained that once he is better the appointment can be re-scheduled just give [place] a call at [phone] and ask for the scheduling department.

## 2017-08-23 ENCOUNTER — Telehealth: Payer: Self-pay | Admitting: Radiation Oncology

## 2017-08-23 MED ORDER — INSULIN LISPRO 100 UNIT/ML ~~LOC~~ SOLN
2.00 | SUBCUTANEOUS | Status: DC
Start: 2017-08-31 — End: 2017-08-23

## 2017-08-23 MED ORDER — ALBUTEROL SULFATE (2.5 MG/3ML) 0.083% IN NEBU
2.50 | INHALATION_SOLUTION | RESPIRATORY_TRACT | Status: DC
Start: 2017-08-23 — End: 2017-08-23

## 2017-08-23 MED ORDER — GENERIC EXTERNAL MEDICATION
500.00 | Status: DC
Start: ? — End: 2017-08-23

## 2017-08-23 MED ORDER — GABAPENTIN 400 MG PO CAPS
400.00 | ORAL_CAPSULE | ORAL | Status: DC
Start: 2017-08-23 — End: 2017-08-23

## 2017-08-23 MED ORDER — GLUCOSE 40 % PO GEL
15.00 g | ORAL | Status: DC
Start: ? — End: 2017-08-23

## 2017-08-23 MED ORDER — IPRATROPIUM BROMIDE HFA 17 MCG/ACT IN AERS
2.00 | INHALATION_SPRAY | RESPIRATORY_TRACT | Status: DC
Start: ? — End: 2017-08-23

## 2017-08-23 MED ORDER — FLUTICASONE FUROATE-VILANTEROL 200-25 MCG/INH IN AEPB
1.00 | INHALATION_SPRAY | RESPIRATORY_TRACT | Status: DC
Start: 2017-08-31 — End: 2017-08-23

## 2017-08-23 MED ORDER — BISACODYL 5 MG PO TBEC
10.00 | DELAYED_RELEASE_TABLET | ORAL | Status: DC
Start: ? — End: 2017-08-23

## 2017-08-23 MED ORDER — ALBUTEROL SULFATE (2.5 MG/3ML) 0.083% IN NEBU
2.50 | INHALATION_SOLUTION | RESPIRATORY_TRACT | Status: DC
Start: ? — End: 2017-08-23

## 2017-08-23 MED ORDER — GUAIFENESIN-DM 100-10 MG/5ML PO SYRP
5.00 | ORAL_SOLUTION | ORAL | Status: DC
Start: ? — End: 2017-08-23

## 2017-08-23 MED ORDER — TAMSULOSIN HCL 0.4 MG PO CAPS
.40 | ORAL_CAPSULE | ORAL | Status: DC
Start: 2017-08-30 — End: 2017-08-23

## 2017-08-23 MED ORDER — MULTI-VITAMINS PO TABS
1.00 | ORAL_TABLET | ORAL | Status: DC
Start: 2017-08-31 — End: 2017-08-23

## 2017-08-23 MED ORDER — ATORVASTATIN CALCIUM 40 MG PO TABS
40.00 | ORAL_TABLET | ORAL | Status: DC
Start: 2017-08-31 — End: 2017-08-23

## 2017-08-23 MED ORDER — GENERIC EXTERNAL MEDICATION
15.00 | Status: DC
Start: 2017-08-23 — End: 2017-08-23

## 2017-08-23 MED ORDER — ACETAMINOPHEN 325 MG PO TABS
650.00 | ORAL_TABLET | ORAL | Status: DC
Start: ? — End: 2017-08-23

## 2017-08-23 MED ORDER — MIRTAZAPINE 15 MG PO TABS
15.00 | ORAL_TABLET | ORAL | Status: DC
Start: 2017-08-30 — End: 2017-08-23

## 2017-08-23 MED ORDER — MAGNESIUM HYDROXIDE 400 MG/5ML PO SUSP
30.00 | ORAL | Status: DC
Start: ? — End: 2017-08-23

## 2017-08-23 MED ORDER — GENERIC EXTERNAL MEDICATION
1.00 | Status: DC
Start: ? — End: 2017-08-23

## 2017-08-23 MED ORDER — GENERIC EXTERNAL MEDICATION
3.38 g | Status: DC
Start: 2017-08-23 — End: 2017-08-23

## 2017-08-23 MED ORDER — GUAIFENESIN ER 600 MG PO TB12
600.00 | ORAL_TABLET | ORAL | Status: DC
Start: 2017-08-30 — End: 2017-08-23

## 2017-08-23 MED ORDER — PANTOPRAZOLE SODIUM 40 MG PO TBEC
40.00 | DELAYED_RELEASE_TABLET | ORAL | Status: DC
Start: 2017-08-31 — End: 2017-08-23

## 2017-08-23 MED ORDER — ENOXAPARIN SODIUM 40 MG/0.4ML ~~LOC~~ SOLN
40.00 | SUBCUTANEOUS | Status: DC
Start: 2017-08-23 — End: 2017-08-23

## 2017-08-23 MED ORDER — ONDANSETRON HCL 4 MG/2ML IJ SOLN
4.00 | INTRAMUSCULAR | Status: DC
Start: ? — End: 2017-08-23

## 2017-08-23 MED ORDER — HYDRALAZINE HCL 20 MG/ML IJ SOLN
10.00 | INTRAMUSCULAR | Status: DC
Start: ? — End: 2017-08-23

## 2017-08-23 MED ORDER — DEXTROSE 50 % IV SOLN
12.00 g | INTRAVENOUS | Status: DC
Start: ? — End: 2017-08-23

## 2017-08-23 MED ORDER — CARVEDILOL 12.5 MG PO TABS
12.50 | ORAL_TABLET | ORAL | Status: DC
Start: 2017-08-23 — End: 2017-08-23

## 2017-08-23 MED ORDER — DOCUSATE SODIUM 100 MG PO CAPS
100.00 | ORAL_CAPSULE | ORAL | Status: DC
Start: ? — End: 2017-08-23

## 2017-08-23 MED ORDER — POLYETHYLENE GLYCOL 3350 17 G PO PACK
17.00 g | PACK | ORAL | Status: DC
Start: ? — End: 2017-08-23

## 2017-08-23 NOTE — Telephone Encounter (Signed)
I called and spoke with the patient's wife. Steve Frank is in the hospital at [facility]. He has had issues with his breathing resulting in what sounds to be a thoracentesis. I shared with her the findings on his recent MRI, and options to consider SRS for salvage. She reports he has had some visual acuity changes in the last few weeks, and I will follow up with her tomorrow to decide how he'd like to proceed.

## 2017-08-24 ENCOUNTER — Telehealth: Payer: Self-pay | Admitting: Radiation Oncology

## 2017-08-24 NOTE — Telephone Encounter (Signed)
The patient's wife was contacted again. He remains hospitalized at [facility] and is oxygen dependant. He is interested in treatment, though his ability to participate in conversations she feels is somewhat limited. She requests to discuss treatment scheduling tomorrow and will be contacted by our navigator.

## 2017-08-25 ENCOUNTER — Ambulatory Visit: Payer: Medicare Other

## 2017-08-25 ENCOUNTER — Ambulatory Visit: Admission: RE | Admit: 2017-08-25 | Payer: Medicare Other | Source: Ambulatory Visit | Admitting: Radiation Oncology

## 2017-08-28 ENCOUNTER — Ambulatory Visit: Payer: Medicare Other | Admitting: Radiation Oncology

## 2017-08-28 ENCOUNTER — Ambulatory Visit: Payer: Medicare Other

## 2017-08-28 ENCOUNTER — Telehealth: Payer: Self-pay | Admitting: Medical Oncology

## 2017-08-28 NOTE — Telephone Encounter (Signed)
F/U appt in 2 days.Pt admitted to [facility] last week . Wife said he had his "Lungs brushed" and removed a lot of old blood. SHe cancelled appt for wed. She said the next step is rehab for him . She is bringing pt in to see xrt on Friday .  Next Med onc appt 8/13.

## 2017-08-29 ENCOUNTER — Inpatient Hospital Stay: Payer: Medicare Other

## 2017-08-29 ENCOUNTER — Inpatient Hospital Stay: Payer: Medicare Other | Admitting: Internal Medicine

## 2017-08-29 ENCOUNTER — Telehealth: Payer: Self-pay | Admitting: Medical Oncology

## 2017-08-29 NOTE — Telephone Encounter (Signed)
Prognosis -Steve Frank still in hospital . Wife and children asking about plan of care for further tx,  prognosis and asked if hospice is  appropriate.

## 2017-08-29 NOTE — Telephone Encounter (Signed)
Social worker said pt going to rehab and the rehab facility is concerned about billing for immunotherapy while pt in rehab. I told her I would call back after [facility] advises.

## 2017-08-29 NOTE — Telephone Encounter (Signed)
Per Julien Nordmann I told wife that he does not recommend hospice and he wants [place] to rehab and get stronger before treating him . I called this information to Education officer, museum.

## 2017-08-30 ENCOUNTER — Ambulatory Visit: Payer: Medicare Other | Admitting: Radiation Oncology

## 2017-08-30 ENCOUNTER — Ambulatory Visit: Payer: Medicare Other

## 2017-08-30 MED ORDER — CARVEDILOL 3.125 MG PO TABS
6.25 | ORAL_TABLET | ORAL | Status: DC
Start: 2017-08-30 — End: 2017-08-30

## 2017-08-30 MED ORDER — SODIUM CHLORIDE 0.9 % IJ SOLN
5.00 | INTRAMUSCULAR | Status: DC
Start: ? — End: 2017-08-30

## 2017-08-30 MED ORDER — SODIUM CHLORIDE 0.9 % IJ SOLN
5.00 | INTRAMUSCULAR | Status: DC
Start: 2017-08-30 — End: 2017-08-30

## 2017-08-30 MED ORDER — FUROSEMIDE 40 MG PO TABS
40.00 | ORAL_TABLET | ORAL | Status: DC
Start: 2017-08-31 — End: 2017-08-30

## 2017-08-30 MED ORDER — ALBUTEROL SULFATE (2.5 MG/3ML) 0.083% IN NEBU
2.50 | INHALATION_SOLUTION | RESPIRATORY_TRACT | Status: DC
Start: 2017-08-31 — End: 2017-08-30

## 2017-08-30 MED ORDER — DIPHENHYDRAMINE HCL 25 MG PO CAPS
25.00 | ORAL_CAPSULE | ORAL | Status: DC
Start: ? — End: 2017-08-30

## 2017-08-30 MED ORDER — GENERIC EXTERNAL MEDICATION
10.00 | Status: DC
Start: 2017-08-30 — End: 2017-08-30

## 2017-08-31 NOTE — Progress Notes (Signed)
Has armband been applied?  Yes.    Does patient have an allergy to IV contrast dye?: No.   Has patient ever received premedication for IV contrast dye?: No.   Does patient take metformin?: No.  If patient does take metformin when was the last dose: N/A  Date of lab work: 08/08/2017 BUN: 25 CR: 0.82  IV site: right subclavian power port  Has IV site been added to flowsheet?  Yes.    BP 111/62   Pulse 62   Temp 97.7 F (36.5 C) (Oral)   Resp 18   Wt 147 lb 14.4 oz (67.1 kg)   SpO2 98%   BMI 20.63 kg/m  Wt Readings from Last 3 Encounters:  09/01/17 147 lb 14.4 oz (67.1 kg)  08/08/17 150 lb 9.6 oz (68.3 kg)  07/18/17 157 lb 14.4 oz (71.6 kg)

## 2017-09-01 ENCOUNTER — Ambulatory Visit
Admission: RE | Admit: 2017-09-01 | Discharge: 2017-09-01 | Disposition: A | Discharge status: Home / Self Care | Payer: Medicare Other | Source: Ambulatory Visit | Attending: Radiation Oncology | Admitting: Radiation Oncology

## 2017-09-01 ENCOUNTER — Ambulatory Visit: Payer: Medicare Other | Admitting: Radiation Oncology

## 2017-09-01 VITALS — BP 111/62 | HR 62 | Temp 97.7°F | Resp 18 | Wt 147.9 lb

## 2017-09-01 DIAGNOSIS — C3432 Malignant neoplasm of lower lobe, left bronchus or lung: Secondary | ICD-10-CM | POA: Diagnosis not present

## 2017-09-01 DIAGNOSIS — C3492 Malignant neoplasm of unspecified part of left bronchus or lung: Secondary | ICD-10-CM

## 2017-09-01 DIAGNOSIS — C7931 Secondary malignant neoplasm of brain: Secondary | ICD-10-CM

## 2017-09-01 MED ORDER — SODIUM CHLORIDE 0.9% FLUSH
10.0000 mL | Freq: Once | INTRAVENOUS | Status: AC
  Administered 2017-09-01: 10 mL via INTRAVENOUS

## 2017-09-01 MED ORDER — HEPARIN SOD (PORK) LOCK FLUSH 100 UNIT/ML IV SOLN
500.0000 [IU] | Freq: Once | INTRAVENOUS | Status: AC
  Administered 2017-09-01: 500 [IU] via INTRAVENOUS

## 2017-09-01 NOTE — Progress Notes (Signed)
  Radiation Oncology         [phone] ________________________________  Name: Steve Frank MRN: [id number]  Date: 09/01/2017  DOB: Jan 17, 1932  SIMULATION AND TREATMENT PLANNING NOTE    ICD-10-CM   1. Brain metastases ([place]) C79.31    DIAGNOSIS:  84 yo man with 9 new brain metastases  NARRATIVE:  The patient was brought to the [facility].  Identity was confirmed.  All relevant records and images related to the planned course of therapy were reviewed.  The patient freely provided informed written consent to proceed with treatment after reviewing the details related to the planned course of therapy. The consent form was witnessed and verified by the simulation staff. Intravenous access was established for contrast administration. Then, the patient was set-up in a stable reproducible supine position for radiation therapy.  A relocatable thermoplastic stereotactic head frame was fabricated for precise immobilization.  CT images were obtained.  Surface markings were placed.  The CT images were loaded into the planning software and fused with the patient's targeting MRI scan.  Then the target and avoidance structures were contoured.  Treatment planning then occurred.  The radiation prescription was entered and confirmed.  I have requested 3D planning  I have requested a DVH of the following structures: Brain stem, brain, left eye, right eye, lenses, optic chiasm, target volumes, uninvolved brain, and normal tissue.    SPECIAL TREATMENT PROCEDURE:  The planned course of therapy using radiation constitutes a special treatment procedure. Special care is required in the management of this patient for the following reasons. This treatment constitutes a Special Treatment Procedure for the following reason: High dose per fraction requiring special monitoring for increased toxicities of treatment including daily imaging.  The special nature of the planned course of radiotherapy will require  increased physician supervision and oversight to ensure patient's safety with optimal treatment outcomes.  PLAN:  The patient will receive 20 Gy in 1 fraction.  ________________________________  Sheral Apley Tammi Klippel, M.D.

## 2017-09-01 NOTE — Progress Notes (Signed)
Flushed power port per protocol. Deaccessed power port upon completion of simulation. Needle intact upon removal. Applied dressing to old access site. Patient tolerated well.

## 2017-09-05 NOTE — Progress Notes (Signed)
Radiation Oncology         (336) [phone] ________________________________  Name: Steve Frank MRN: [id number]  Date: 09/01/2017  DOB: 01/08/1932  Post Treatment Note  CC: [name], MD  Grace Isaac, MD  Diagnosis:   84 y.o. 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  Brain 11/17/16- Solitary leftcerebellartarget was treated to a prescription dose of 16Gy.  2.  Chest 11/21/2016 - 01/10/2017- The primary tumor in the LLL and involved mediastinal adenopathy were treated to 66 Gy in 33 fractions of 2 Gy.  Narrative:  The patient returns today for routine follow-up and to review results from his recent post-treatment brain MRI.  He has recovered well from the effects of radiotherapy and is without complaints at this point.  In summary, he initially presented to the ED for evaluation following a fall at home in June 2018. MRI Brain was performed at that time and showed 2.2 x 2.2 x 1.7 cm mass in the left posterior fossa just below the tentorium. There was extensive vasogenic edema in the adjacent cerebellum, suspicious for meningioma but metastatic disease was less likely. CT scan of the chest, abdomen, and pelvis were performed on 08/04/2016 and showed mediastinal and left hilar adenopathy. There was a 4.0 x 6.0 cm left infrahilar mass  that could be confluent lymphadenopathy or central lung neoplasm. No evidence of metastatic disease in the abdomen or pelvis.  On 08/05/2016, the patient underwent attempted excisional biopsy of the cerebellar lesion, but the final pathology showed normal brain tissue. Post-op imaging showed that the biopsy tract did not reach the tumor location.  Due to the suspicion that the brain lesion might be metastasis from lung cancer, the patient also underwent bronchoscopy on 08/07/2016, and the cytology was remarkable for  chronic inflammatory cells. He was followed by observation, and PET scan on 09/20/2016 showed hypermetabolic lesion in the left cerebellum with intense metabolic activity. There was also hypermetabolic left infrahilar mass measuring 2.0 cm with SUV max of 4.0, hypermetabolic subcarinal and right lower paratracheal adenopathy as well as a right lower paratracheal lymph node measuring 2.0 cm in short axis with SUV max of 4.9 and a small hypermetabolic left supraclavicular node measuring 0.8 cm with SUV max equal 2.3. There was also a right upper lobe pulmonary nodule measuring 0.8 cm with no associated hypermetabolic activity but no concerning findings in the abdomen, pelvis, or skeleton.   He underwent bronchoscopy with endobronchial ultrasound and transbronchial biopsy of 4R and 10L nodes with Dr. Servando Snare on 10/14/16. The final cytology ([id number]) of the fine-needle aspiration of the 4R lymph node showed malignant cells consistent with non-small cell carcinoma favoring squamous cell carcinoma.  He was treated with palliative SRS to the solitary brain lesion on 11/17/16 followed by concurrent chemoradiation from 11/21/16 - 01/10/17, with 4 cycles of carboplatin, paclitaxel and Keytruda.  He tolerated the treatment well with his initial posttreatment CT chest showing an excellent response to treatment with significant improvement of his disease.  Posttreatment MRI scan on February 16, 2017 showed an excellent posttreatment appearance with the treated left cerebellar tumor mass completely unapparent.  There were noted 2 punctate foci of enhancement, one in the left middle cerebellar peduncle and the other in the medial inferior cerebellar hemisphere which were inconclusive and recommendation was to continue to follow on subsequent scans.  He  was started on maintenance treatment with single agent Keytruda on March 14, 2017 and has remained on this treatment since that time, now status post 10 cycles and  continues to tolerate this well.  His most recent systemic imaging with a CT chest, abdomen and pelvis on 07/17/2017 continued to show stable appearance without evidence of local recurrence or progressive metastatic disease.  Repeat MRI of the brain on 05/18/2017 showed no evidence for residual or recurrent metastases as well as diminished punctate foci of enhancement in the left middle cerebral peduncle and resolved second focus of enhancement felt most likely to be posttreatment changes.  Interval History:  He has continued on maintenance treatment with single agent Keytruda 200 mg IV every 3 weeks (first cycle March 14, 2017), status post 10 cycles and tolerating well.  His most recent repeat MRI brain from 08/17/2017 demonstrates stable posttreatment appearance of the left cerebellum, however there is disease progression as evidenced by 9 new enhancing brain metastases ranging from punctate to 13 mm at largest.  The largest lesion is a 13 mm diameter enhancing lesion located right parietal lobe with moderate surrounding edema and mild regional mass effect.  The second largest lesion is an 8 mm inferior lateral right temporal lobe lesion with mild surrounding edema and no mass-effect.  The smallest lesions are 3 punctate enhancing foci without edema or mass-effect seen in the mesial right temporal lobe, medial anterior left frontal lobe and anterior left middle frontal gyrus.  For additional metastases ranging from 3 to 6 mm, with mild if any associated edema and no mass-effect are seen in the left lateral occipital lobe, posterior right inferior frontal gyrus, anterior left middle frontal gyrus and left superior frontal gyrus.   Unfortunately, he was admitted to [facility] recently due to increased shortness of breath and found to have increased bilateral large pleural effusions requiring multiple thoracentesis.  He was also treated empirically for suspected postobstructive pneumonia and has completed his course of  abx.  He reports significant improvement in his breathing s/p thoracentesis procedures and has been discharged to [place] for rehab with strength and conditioning. He continues on 4L supplemental O2 via [place].                    On review of systems, the patient states that he is doing much better since discharge from hospital but remains quite weak.  His only complaint at present is generalized fatigue, weakness and decreased appetite which he attributes to ongoing systemic therapy. His breathing is much improved and he denies chest pain, increased productive cough or any hemoptysis. He has chronic productive cough with whitish colored sputum, unchanged recently. He denies abdominal pain, N/V, diarrhea, or constipation.  He has not had recent fevers, chills or nightsweats. He has noticed increased shortness of breath with exertion over the past month requiring recent hospital admission but sxs improved s/p multiple thoracentesis procedures.  He is using supplemental O2 via [place] 24/7 at his SNF but unfortunately, they did not send him with O2 to his appointment today.  He denies headaches, visual or auditory changes, increased weakness/imbalance, difficulty with speech, seizure activity or tremor.  He has continued with left sided weakness, imbalance and instability with ambulation since his surgery. He continues working with PT to regain strength and balance. In fact, he reports fewer falls at home associated with his imbalance due to being more conscientious and careful when ambulating around the house short distances. He continues using a  rolling walker for safety.  ALLERGIES:  is allergic to ciprofloxacin.  Meds: Current Outpatient Medications  Medication Sig Dispense Refill  . acetaminophen (TYLENOL) 325 MG tablet Take 650 mg by mouth every 6 (six) hours as needed (for pain/headaches.).     Marland Kitchen albuterol (PROVENTIL HFA;VENTOLIN HFA) 108 (90 Base) MCG/ACT inhaler Inhale 2 puffs into the lungs every  6 (six) hours as needed (for wheezing/shortness of breath).     . B-D INS SYR ULTRAFINE 1CC/31G 31G X 5/16" 1 ML MISC     . bisacodyl (DULCOLAX) 10 MG suppository Place rectally.    . carvedilol (COREG) 25 MG tablet Take by mouth.    . Cholecalciferol (VITAMIN D-1000 MAX ST) 1000 units tablet Take 1,000 Units by mouth daily.     . clotrimazole-betamethasone (LOTRISONE) cream Apply 1 application topically 2 (two) times daily.    Marland Kitchen docusate sodium (COLACE) 100 MG capsule Take 100 mg by mouth 2 (two) times daily as needed (for constipation.).    Marland Kitchen furosemide (LASIX) 20 MG tablet Take by mouth.    . gabapentin (NEURONTIN) 400 MG capsule Take 400 mg by mouth at bedtime.    Marland Kitchen guaiFENesin-dextromethorphan (ROBITUSSIN DM) 100-10 MG/5ML syrup Take 15 mLs every 4 (four) hours as needed by mouth for cough.    Marland Kitchen HUMALOG 100 UNIT/ML injection INJECT 2 TO 0.12MLS (2-8 UNITS) UNDER THE SKIN 3 TIMES A DAY BEFORE MEALS, AS PER SLIDING SCALE  0  . Lancets (FREESTYLE) lancets     . LANTUS 100 UNIT/ML injection INJECT 0.15ML (15 UNITS TOTAL) UNDER THE SKIN DAILY AT BEDTIME.  0  . lidocaine-prilocaine (EMLA) cream Apply 1 application topically as needed. 30 g 0  . magnesium hydroxide (MILK OF MAGNESIA) 400 MG/5ML suspension Take 30 mLs by mouth daily as needed (for constipation.).    Marland Kitchen mirtazapine (REMERON) 15 MG tablet Take 15 mg by mouth at bedtime.  0  . Multiple Vitamin (MULTIVITAMIN WITH MINERALS) TABS tablet Take 1 tablet by mouth daily.    Marland Kitchen neomycin-bacitracin-polymyxin (NEOSPORIN) [phone] ointment Apply 1 application topically at bedtime.    Marland Kitchen omeprazole (PRILOSEC) 20 MG capsule Take 20 mg by mouth at bedtime.     . polyethylene glycol (MIRALAX / GLYCOLAX) packet Take 17 g by mouth daily as needed (for constipation.).     Marland Kitchen prochlorperazine (COMPAZINE) 10 MG tablet Take 1 tablet (10 mg total) by mouth every 6 (six) hours as needed for nausea or vomiting. (Patient not taking: Reported on 07/18/2017) 30  tablet 0  . rosuvastatin (CRESTOR) 20 MG tablet Take 20 mg by mouth daily.    . Sodium Phosphates (ENEMA) 7-19 GM/118ML ENEM Place rectally.    . tamsulosin (FLOMAX) 0.4 MG CAPS capsule Take 1 capsule by mouth once.    . triamcinolone (KENALOG) 0.025 % ointment Apply 1 application 2 (two) times daily topically. 30 g 1   No current facility-administered medications for this encounter.     Physical Findings:  vitals were not taken for this visit.   /10 In general this is a well appearing caucasian male in no acute distress. He's alert and oriented x4 and appropriate throughout the examination. Cardiopulmonary assessment is negative for acute distress and he exhibits normal effort.  He appears grossly neurologically intact.  Sensation is intact to light touch and strength is 4/5 and equal bilaterally in the lower extremities.  Lab Findings: Lab Results  Component Value Date   WBC 7.1 08/08/2017   HGB 12.3 (L) 08/08/2017  HCT 37.5 (L) 08/08/2017   MCV 98.7 (H) 08/08/2017   PLT 144 08/08/2017     Radiographic Findings: Mr Jeri Cos TO Contrast  Result Date: 08/17/2017 CLINICAL DATA:  84 year old male with non-small cell left lung carcinoma treated with SRS for solitary left cerebellar brain metastasis in October 2018. Restaging. EXAM: MRI HEAD WITHOUT AND WITH CONTRAST TECHNIQUE: Multiplanar, multiecho pulse sequences of the brain and surrounding structures were obtained without and with intravenous contrast. CONTRAST:  64mL MULTIHANCE GADOBENATE DIMEGLUMINE 529 MG/ML IV SOLN COMPARISON:  Brain MRI 05/18/2017 and earlier. FINDINGS: BRAIN New Lesions: 9 new enhancing metastases are identified: The largest is a 13 mm diameter enhancing lesion located right parietal lobe with moderate surrounding edema and mild regional mass effect. Series 11, image 122. Second largest lesion is 8 mm diameter in the inferolateral right temporal lobe with mild surrounding edema and no mass effect seen on series 11,  image 51. Smallest lesions are 3 punctate enhancing foci with no edema or mass effect seen: - mesial right temporal lobe on series 11, image 54. - medial anterior left frontal lobe, image 94. - anterior left middle frontal gyrus, image 108. Four additional metastases ranging from 3-6 millimeters with mild if any associated edema and no mass effect are identified: -lateral left occipital lobe, image 53. -posterior right inferior frontal gyrus, image 60. -anterior left middle frontal gyrus, image 114. -left superior frontal gyrus image 124. Stable or Smaller lesions: Stable and satisfactory post treatment appearance of the left cerebellar hemisphere with residual encephalomalacia and hemosiderin. Other Brain findings: No midline shift or ventriculomegaly. Patchy and confluent scattered nonenhancing cerebral white matter T2 and FLAIR hyperintensity is stable. No restricted diffusion to suggest acute infarction. No extra-axial collection or acute intracranial hemorrhage. Cervicomedullary junction and pituitary are within normal limits. Vascular: Major intracranial vascular flow voids are stable. The major dural venous sinuses are enhancing and appear patent. Skull and upper cervical spine: Negative visible cervical spine and spinal cord. Normal bone marrow signal. Sinuses/Orbits: Stable and negative. Other: Visible internal auditory structures appear normal. Mastoids remain clear. IMPRESSION: 1. Progression disease as evidenced by [name] new enhancing brain metastases ranging from punctate to 13 mm. The largest lesions demonstrate surrounding vasogenic edema, most pronounced in the right parietal lobe with mild regional mass effect. All lesions are annotated on series 11. 2. Satisfactory post treatment appearance of the left cerebellum. Electronically Signed   By: Genevie Ann M.D.   On: 08/17/2017 15:37    Impression/Plan: 1. 84 y.o. gentleman with Stage IVnon-small cell carcinoma of the left lower lung with brain  metastases.    Recent post-treatment brain MRI from 08/17/17 shows stable posttreatment appearance of the left cerebellum, however there is disease progression as evidenced by 9 new enhancing brain metastases ranging from punctate to 13 mm at largest.  The largest lesion is a 13 mm diameter enhancing lesion located right parietal lobe with moderate surrounding edema and mild regional mass effect.  The second largest lesion is an 8 mm inferior lateral right temporal lobe lesion with mild surrounding edema and no mass-effect.  The smallest lesions are 3 punctate enhancing foci without edema or mass-effect seen in the mesial right temporal lobe, medial anterior left frontal lobe and anterior left middle frontal gyrus.  For additional metastases ranging from 3 to 6 mm, with mild if any associated edema and no mass-effect are seen in the left lateral occipital lobe, posterior right inferior frontal gyrus, anterior left middle frontal gyrus and left  superior frontal gyrus.  His imaging was reviewed in multidisciplinary brain conference on 08/16/17 and consensus recommendation is to proceed with salvage SRS to the 9 new lesions.    Nicholos Johns, PA-C

## 2017-09-05 NOTE — Addendum Note (Signed)
Encounter addended by: Freeman Caldron, PA-C on: 09/05/2017 12:03 PM  Actions taken: Sign clinical note

## 2017-09-06 ENCOUNTER — Ambulatory Visit: Payer: Medicare Other | Admitting: Radiation Oncology

## 2017-09-07 ENCOUNTER — Ambulatory Visit: Payer: Medicare Other | Admitting: Radiation Oncology

## 2017-09-07 DIAGNOSIS — C7931 Secondary malignant neoplasm of brain: Secondary | ICD-10-CM | POA: Diagnosis not present

## 2017-09-07 DIAGNOSIS — Z510 Encounter for antineoplastic radiation therapy: Secondary | ICD-10-CM | POA: Diagnosis present

## 2017-09-08 ENCOUNTER — Encounter: Payer: Self-pay | Admitting: Radiation Oncology

## 2017-09-08 ENCOUNTER — Ambulatory Visit: Payer: Medicare Other | Admitting: Radiation Oncology

## 2017-09-08 ENCOUNTER — Ambulatory Visit
Admission: RE | Admit: 2017-09-08 | Discharge: 2017-09-08 | Disposition: A | Discharge status: Home / Self Care | Payer: Medicare Other | Source: Ambulatory Visit | Attending: Radiation Oncology | Admitting: Radiation Oncology

## 2017-09-08 VITALS — BP 134/68 | HR 66 | Temp 98.3°F | Resp 18

## 2017-09-08 DIAGNOSIS — Z510 Encounter for antineoplastic radiation therapy: Secondary | ICD-10-CM | POA: Diagnosis not present

## 2017-09-08 DIAGNOSIS — C7931 Secondary malignant neoplasm of brain: Secondary | ICD-10-CM

## 2017-09-08 NOTE — Progress Notes (Addendum)
[place]  Arrived by wheelchair with Steve Frank  post SRS to brain x one. Vital signs taken Patient name  arrived with Steve Frank ambulatory post [facility] to brain x one. 1304 Vital signs taken BP 136/64 (BP Location: Right Arm, Patient Position: Sitting, Cuff Size: Normal)   Pulse 64   Temp (!) 97.5 F (36.4 C) (Oral)   Resp 20   SpO2 94%  Monitor for  30 minutes alert and oriented x 3, denies headache, dizziness or any visual problems or fatigue or pain. Mr. Nodal and [name] knows to call for any unusual symptoms, increased head ache that won't go away, increased fever > 100.5, increased vomiting, vision changes.  Understands to avoid strenuous activity for the next 24 hours and call [phone] with needs.  [address]  Alert and oriented x 3, denies headache, dizziness or any visual problems or fatigue or pain.  BP 134/68 (BP Location: Right Arm, Patient Position: Sitting, Cuff Size: Normal)   Pulse 66   Temp 98.3 F (36.8 C) (Oral)   Resp 18   SpO2 96%  1330 Discharged home with wife to car in wheelchair without complaints.

## 2017-09-08 NOTE — Progress Notes (Signed)
  Radiation Oncology         (336) [phone] ________________________________  Stereotactic Treatment Procedure Note  Name: Steve Frank MRN: [id number]  Date: 09/08/2017  DOB: [birth year]  SPECIAL TREATMENT PROCEDURE    ICD-10-CM   1. Brain metastases ([place]) C79.31     3D TREATMENT PLANNING AND DOSIMETRY:  The patient's radiation plan was reviewed and approved by neurosurgery and radiation oncology prior to treatment.  It showed 3-dimensional radiation distributions overlaid onto the planning CT/MRI image set.  The [facility] for the target structures as well as the organs at risk were reviewed. The documentation of the 3D plan and dosimetry are filed in the radiation oncology EMR.  NARRATIVE:  Steve Frank was brought to the TrueBeam stereotactic radiation treatment machine and placed supine on the CT couch. The head frame was applied, and the patient was set up for stereotactic radiosurgery.  Neurosurgery was present for the set-up and delivery  SIMULATION VERIFICATION:  In the couch zero-angle position, the patient underwent Exactrac imaging using the Brainlab system with orthogonal KV images.  These were carefully aligned and repeated to confirm treatment position for each of the isocenters.  The Exactrac snap film verification was repeated at each couch angle.  PROCEDURE: Steve Frank received stereotactic radiosurgery to the following targets: [name] cerebral targets (largest 75mm, then 8 mm, then 58mm, then six 2-5 mm in diameter) using a multitarget, single isocenter technique: 9 targets were treated using 6 Rapid Arc VMAT Beams to a prescription dose of 20 Gy for each lesion.  ExacTrac registration was performed for each couch angle.  The 100% isodose line was prescribed.  STEREOTACTIC TREATMENT MANAGEMENT:  Following delivery, the patient was transported to nursing in stable condition and monitored for possible acute effects.  Vital signs were recorded BP 134/68 (BP Location: Right Arm,  Patient Position: Sitting, Cuff Size: Normal)   Pulse 66   Temp 98.3 F (36.8 C) (Oral)   Resp 18   SpO2 96% . The patient tolerated treatment without significant acute effects, and was discharged to home in stable condition.    PLAN: Follow-up in one month.  ________________________________  Sheral Apley. Tammi Klippel, M.D.   This document serves as a record of services personally performed by Tyler Pita MD. It was created on his behalf by Delton Coombes, a trained medical scribe. The creation of this record is based on the scribe's personal observations and the provider's statements to them.

## 2017-09-08 NOTE — Op Note (Signed)
Stereotactic Radiosurgery Operative Note  Name: Steve Frank MRN: [id number]  Date: 09/08/2017  DOB: May 03, 1931  Op Note  Pre Operative Diagnosis:  Metastatic non-small cell lung cancer with multiple brain metastases  Post Operative Diagnois:  Metastatic non-small cell lung cancer with multiple brain metastases  3D TREATMENT PLANNING AND DOSIMETRY:  The patient's radiation plan was reviewed and approved by myself (neurosurgery) and Dr. Ledon Snare (radiation oncology) prior to treatment.  It showed 3-dimensional radiation distributions overlaid onto the planning CT/MRI image set.  The [facility] for the target structures as well as the organs at risk were reviewed. The documentation of the 3D plan and dosimetry are filed in the radiation oncology EMR.  NARRATIVE:  Steve Frank was brought to the TrueBeam stereotactic radiation treatment machine and placed supine on the CT couch. The head frame was applied, and the patient was set up for stereotactic radiosurgery.  I was present for the set-up and delivery.  SIMULATION VERIFICATION:  In the couch zero-angle position, the patient underwent Exactrac imaging using the Brainlab system with orthogonal KV images.  These were carefully aligned and repeated to confirm treatment position for each of the isocenters.  The Exactrac snap film verification was repeated at each couch angle.  SPECIAL TREATMENT PROCEDURE: Steve Frank received stereotactic radiosurgery to nine cerebral targets (largest 64mm, then 8 mm, then 56mm, then six 2-5 mm in diameter) using a multitarget, single isocenter technique: 9 targets were treated using 6 Rapid Arc VMAT Beams to a prescription dose of 20 Gy for each lesion.  ExacTrac registration was performed for each couch angle.  The 100% isodose line was prescribed.  STEREOTACTIC TREATMENT MANAGEMENT:  Following delivery, the patient was transported to nursing in stable condition and monitored for possible acute effects.  Vital  signs were recorded. The patient tolerated treatment without significant acute effects, and was discharged to home in stable condition.    PLAN: Follow-up in one month.

## 2017-09-13 NOTE — Progress Notes (Signed)
  Radiation Oncology         [phone] ________________________________  Name: Steve Frank MRN: [id number]  Date: 09/08/2017  DOB: Jul 31, 1931  End of Treatment Note  Diagnosis:   84 y.o. male with 9 new brain metastases     Indication for treatment:  palliative       Radiation treatment dates:   09/08/2017  Site/dose:   Brain PTV2-10 // 20 Gy in 1 fraction, max dose= 136.4% PTV2: Right Parietal 79mm / 20Gy PTV3: Right Temporal 83mm / 20 Gy PTV4: Right Temporal 9mm / 20 Gy PTV5: Left Frontal 23mm / 20 Gy PTV6: Left Frontal 45mm / 20 Gy PTV7: Left Occipital 39mm / 20 Gy PTV8: Right Frontal 62mm / 20 Gy PTV9: Left Frontal 19mm / 20 Gy PTV10: Left Frontal 35mm / 20 Gy  Beams/energy:   ExacTrac SBRT/SRT-VMAT, 6 VMAT beams // 6FFF Photon  Narrative: The patient tolerated radiation treatment well.   There were no signs of acute toxicity after treatment.  Plan: The patient has completed radiation treatment. The patient will return to radiation oncology clinic for routine followup in one month. I advised the patient to call or return sooner if they have any questions or concerns related to their recovery or treatment. ________________________________   Tyler Pita, MD  This document serves as a record of services personally performed by Tyler Pita, MD. It was created on his behalf by Rae Lips, a trained medical scribe. The creation of this record is based on the scribe's personal observations and the provider's statements to them. This document has been checked and approved by the attending provider.

## 2017-09-18 ENCOUNTER — Other Ambulatory Visit: Payer: Self-pay | Admitting: *Deleted

## 2017-09-19 ENCOUNTER — Inpatient Hospital Stay: Payer: Medicare Other | Attending: Internal Medicine

## 2017-09-19 ENCOUNTER — Telehealth: Payer: Self-pay | Admitting: Internal Medicine

## 2017-09-19 ENCOUNTER — Encounter: Payer: Self-pay | Admitting: Internal Medicine

## 2017-09-19 ENCOUNTER — Inpatient Hospital Stay: Payer: Medicare Other

## 2017-09-19 ENCOUNTER — Other Ambulatory Visit: Payer: Self-pay | Admitting: Medical Oncology

## 2017-09-19 ENCOUNTER — Inpatient Hospital Stay (HOSPITAL_BASED_OUTPATIENT_CLINIC_OR_DEPARTMENT_OTHER): Payer: Medicare Other | Admitting: Internal Medicine

## 2017-09-19 ENCOUNTER — Ambulatory Visit (HOSPITAL_COMMUNITY)
Admission: RE | Admit: 2017-09-19 | Discharge: 2017-09-19 | Disposition: A | Discharge status: Home / Self Care | Payer: Medicare Other | Source: Ambulatory Visit | Attending: Internal Medicine | Admitting: Internal Medicine

## 2017-09-19 VITALS — BP 109/63 | HR 52 | Temp 97.5°F | Resp 18 | Ht 71.0 in | Wt 148.9 lb

## 2017-09-19 DIAGNOSIS — C7931 Secondary malignant neoplasm of brain: Secondary | ICD-10-CM

## 2017-09-19 DIAGNOSIS — Z79899 Other long term (current) drug therapy: Secondary | ICD-10-CM

## 2017-09-19 DIAGNOSIS — I2510 Atherosclerotic heart disease of native coronary artery without angina pectoris: Secondary | ICD-10-CM | POA: Insufficient documentation

## 2017-09-19 DIAGNOSIS — C778 Secondary and unspecified malignant neoplasm of lymph nodes of multiple regions: Secondary | ICD-10-CM | POA: Insufficient documentation

## 2017-09-19 DIAGNOSIS — C3492 Malignant neoplasm of unspecified part of left bronchus or lung: Secondary | ICD-10-CM

## 2017-09-19 DIAGNOSIS — Z5112 Encounter for antineoplastic immunotherapy: Secondary | ICD-10-CM | POA: Diagnosis not present

## 2017-09-19 DIAGNOSIS — C3412 Malignant neoplasm of upper lobe, left bronchus or lung: Secondary | ICD-10-CM | POA: Diagnosis not present

## 2017-09-19 DIAGNOSIS — E119 Type 2 diabetes mellitus without complications: Secondary | ICD-10-CM | POA: Diagnosis not present

## 2017-09-19 DIAGNOSIS — I10 Essential (primary) hypertension: Secondary | ICD-10-CM | POA: Diagnosis not present

## 2017-09-19 DIAGNOSIS — J90 Pleural effusion, not elsewhere classified: Secondary | ICD-10-CM | POA: Diagnosis not present

## 2017-09-19 DIAGNOSIS — Z95828 Presence of other vascular implants and grafts: Secondary | ICD-10-CM

## 2017-09-19 DIAGNOSIS — R0902 Hypoxemia: Secondary | ICD-10-CM

## 2017-09-19 LAB — CMP (CANCER CENTER ONLY)
ALK PHOS: 66 U/L (ref 38–126)
ALT: 19 U/L (ref 0–44)
ANION GAP: 12 (ref 5–15)
AST: 22 U/L (ref 15–41)
Albumin: 2.9 g/dL — ABNORMAL LOW (ref 3.5–5.0)
BILIRUBIN TOTAL: 0.5 mg/dL (ref 0.3–1.2)
BUN: 33 mg/dL — AB (ref 8–23)
CALCIUM: 8.4 mg/dL — AB (ref 8.9–10.3)
CO2: 25 mmol/L (ref 22–32)
CREATININE: 0.98 mg/dL (ref 0.61–1.24)
Chloride: 104 mmol/L (ref 98–111)
GFR, Est AFR Am: >60 mL/min (ref >60)
GFR, Estimated: >60 mL/min (ref >60)
GLUCOSE: 216 mg/dL — AB (ref 70–99)
Potassium: 4 mmol/L (ref 3.5–5.1)
SODIUM: 141 mmol/L (ref 135–145)
Total Protein: 6.2 g/dL — ABNORMAL LOW (ref 6.5–8.1)

## 2017-09-19 LAB — CBC WITH DIFFERENTIAL (CANCER CENTER ONLY)
BASOS ABS: 0.1 10*3/uL (ref 0.0–0.1)
BASOS PCT: 1 %
EOS ABS: 1.7 10*3/uL — AB (ref 0.0–0.5)
EOS PCT: 19 %
HEMATOCRIT: 36.7 % — AB (ref 38.4–49.9)
Hemoglobin: 12.2 g/dL — ABNORMAL LOW (ref 13.0–17.1)
Lymphocytes Relative: 6 %
Lymphs Abs: 0.5 10*3/uL — ABNORMAL LOW (ref 0.9–3.3)
MCH: 32.7 pg (ref 27.2–33.4)
MCHC: 33.2 g/dL (ref 32.0–36.0)
MCV: 98.6 fL — ABNORMAL HIGH (ref 79.3–98.0)
MONO ABS: 0.6 10*3/uL (ref 0.1–0.9)
MONOS PCT: 7 %
NEUTROS ABS: 6.1 10*3/uL (ref 1.5–6.5)
Neutrophils Relative %: 67 %
PLATELETS: 129 10*3/uL — AB (ref 140–400)
RBC: 3.72 MIL/uL — ABNORMAL LOW (ref 4.20–5.82)
RDW: 16.2 % — AB (ref 11.0–14.6)
WBC Count: 9 10*3/uL (ref 4.0–10.3)

## 2017-09-19 MED ORDER — SODIUM CHLORIDE 0.9 % IV SOLN
Freq: Once | INTRAVENOUS | Status: AC
  Administered 2017-09-19: 12:00:00 via INTRAVENOUS
  Filled 2017-09-19: qty 250

## 2017-09-19 MED ORDER — HEPARIN SOD (PORK) LOCK FLUSH 100 UNIT/ML IV SOLN
500.0000 [IU] | Freq: Once | INTRAVENOUS | Status: AC | PRN
  Administered 2017-09-19: 500 [IU]
  Filled 2017-09-19: qty 5

## 2017-09-19 MED ORDER — SODIUM CHLORIDE 0.9% FLUSH
10.0000 mL | Freq: Once | INTRAVENOUS | Status: DC
  Filled 2017-09-19: qty 10

## 2017-09-19 MED ORDER — SODIUM CHLORIDE 0.9% FLUSH
10.0000 mL | INTRAVENOUS | Status: DC | PRN
  Administered 2017-09-19: 10 mL
  Filled 2017-09-19: qty 10

## 2017-09-19 MED ORDER — SODIUM CHLORIDE 0.9 % IV SOLN
200.0000 mg | Freq: Once | INTRAVENOUS | Status: AC
  Administered 2017-09-19: 200 mg via INTRAVENOUS
  Filled 2017-09-19: qty 8

## 2017-09-19 NOTE — Progress Notes (Addendum)
SATURATION QUALIFICATIONS: (This note is used to comply with regulatory documentation for home oxygen)  Patient Saturations on Room Air at Rest =95%  Patient Saturations on Room Air while Ambulating =82%  Patient Saturations on 2 Liters of oxygen while Ambulating = 96% Please briefly explain why patient needs home oxygen: unable to increase oxygen by other means

## 2017-09-19 NOTE — Patient Instructions (Signed)
[facility] Discharge Instructions for Patients Receiving Chemotherapy  Today you received the following chemotherapy agents :  Keytruda.  To help prevent nausea and vomiting after your treatment, we encourage you to take your nausea medication as prescribed.   If you develop nausea and vomiting that is not controlled by your nausea medication, call the clinic.   BELOW ARE SYMPTOMS THAT SHOULD BE REPORTED IMMEDIATELY:  *FEVER GREATER THAN 100.5 F  *CHILLS WITH OR WITHOUT FEVER  NAUSEA AND VOMITING THAT IS NOT CONTROLLED WITH YOUR NAUSEA MEDICATION  *UNUSUAL SHORTNESS OF BREATH  *UNUSUAL BRUISING OR BLEEDING  TENDERNESS IN MOUTH AND THROAT WITH OR WITHOUT PRESENCE OF ULCERS  *URINARY PROBLEMS  *BOWEL PROBLEMS  UNUSUAL RASH Items with * indicate a potential emergency and should be followed up as soon as possible.  Feel free to call the clinic should you have any questions or concerns. The clinic phone number is [phone].  Please show the CHEMO ALERT CARD at check-in to the Emergency Department and triage nurse.  

## 2017-09-19 NOTE — Progress Notes (Signed)
[facility] [phone]   [phone]  OFFICE PROGRESS NOTE  [name], MD [address] [place] [address]  DIAGNOSIS: Stage IV (T1b, N3, M1c) non-small cell lung cancer, squamous cell carcinoma diagnosed in September 2018 presented with left infrahilar mass in addition to mediastinal and supraclavicular lymphadenopathy as well as metastatic brain lesion.  PDL1 expression 0%.  PRIOR THERAPY:  1) Palliative radiotherapy to the brain metastasis. 2)  Systemic chemotherapy with carboplatin for AUC of 5, paclitaxel 175 MG/M2 and Ketruda (pembrolizumab) 200 MG IV every 3 weeks. First dose 11/21/2016.  Status post 4 cycles.  CURRENT THERAPY: Maintenance treatment with single agent Keytruda 200 mg IV every 3 weeks.  First cycle March 14, 2017.  Status post 10 cycles.  INTERVAL HISTORY: Steve Frank 84 y.o. male returns to the clinic today for follow-up visit accompanied by his wife.  The patient is feeling fine today with no concerning complaints except for shortness of breath with exertion.  He was admitted to [facility] a few weeks ago complaining of shortness of breath and oxygen saturation in the upper 70s.  He was found to have right pleural effusion and he underwent ultrasound-guided right thoracentesis with drainage of 1.7 L of fluid.  The patient denied having any current chest pain, cough or hemoptysis.  He denied having any recent weight loss or night sweats.  He has no nausea, vomiting, diarrhea or constipation.  He has no fever or chills.  Had some mild erythema in the left second toe.  He is here today for evaluation before resuming his maintenance treatment with Keytruda.   MEDICAL HISTORY: Past Medical History:  Diagnosis Date  . Arthritis   . BPH (benign prostatic hyperplasia)   . CAD (coronary artery disease)    s/p 2 cabg's  . Cancer (HCC)    Basal and squamous cell  skin cancer  . Cerebellar mass   . Chest  mass   . Diabetes ([place])    Type II  . GERD (gastroesophageal reflux disease)   . Head injury with loss of consciousness [facility])    age 29 or 11-   . Heart attack ([place])   . HLD (hyperlipidemia)   . Hypertension   . Macular degeneration   . Pneumonia     x 2 last 1071  . Stage IV squamous cell carcinoma of left lung ([place]) 10/20/2016    ALLERGIES:  is allergic to ciprofloxacin.  MEDICATIONS:  Current Outpatient Medications  Medication Sig Dispense Refill  . acetaminophen (TYLENOL) 325 MG tablet Take 650 mg by mouth every 6 (six) hours as needed (for pain/headaches.).     Marland Kitchen albuterol (PROVENTIL HFA;VENTOLIN HFA) 108 (90 Base) MCG/ACT inhaler Inhale 2 puffs into the lungs every 6 (six) hours as needed (for wheezing/shortness of breath).     . B-D INS SYR ULTRAFINE 1CC/31G 31G X 5/16" 1 ML MISC     . bisacodyl (DULCOLAX) 10 MG suppository Place rectally.    . carvedilol (COREG) 25 MG tablet Take by mouth.    . Cholecalciferol (VITAMIN D-1000 MAX ST) 1000 units tablet Take 1,000 Units by mouth daily.     . clotrimazole-betamethasone (LOTRISONE) cream Apply 1 application topically 2 (two) times daily.    Marland Kitchen docusate sodium (COLACE) 100 MG capsule Take 100 mg by mouth 2 (two) times daily as needed (for constipation.).    Marland Kitchen furosemide (LASIX) 20 MG tablet Take by mouth.    Marland Kitchen  gabapentin (NEURONTIN) 400 MG capsule Take 400 mg by mouth at bedtime.    Marland Kitchen guaiFENesin-dextromethorphan (ROBITUSSIN DM) 100-10 MG/5ML syrup Take 15 mLs every 4 (four) hours as needed by mouth for cough.    Marland Kitchen HUMALOG 100 UNIT/ML injection INJECT 2 TO 0.12MLS (2-8 UNITS) UNDER THE SKIN 3 TIMES A DAY BEFORE MEALS, AS PER SLIDING SCALE  0  . Lancets (FREESTYLE) lancets     . LANTUS 100 UNIT/ML injection INJECT 0.15ML (15 UNITS TOTAL) UNDER THE SKIN DAILY AT BEDTIME.  0  . lidocaine-prilocaine (EMLA) cream Apply 1 application topically as needed. 30 g 0  . magnesium hydroxide (MILK OF MAGNESIA) 400 MG/5ML suspension Take 30 mLs by  mouth daily as needed (for constipation.).    Marland Kitchen mirtazapine (REMERON) 15 MG tablet Take 15 mg by mouth at bedtime.  0  . Multiple Vitamin (MULTIVITAMIN WITH MINERALS) TABS tablet Take 1 tablet by mouth daily.    Marland Kitchen neomycin-bacitracin-polymyxin (NEOSPORIN) [phone] ointment Apply 1 application topically at bedtime.    Marland Kitchen omeprazole (PRILOSEC) 20 MG capsule Take 20 mg by mouth at bedtime.     . polyethylene glycol (MIRALAX / GLYCOLAX) packet Take 17 g by mouth daily as needed (for constipation.).     Marland Kitchen prochlorperazine (COMPAZINE) 10 MG tablet Take 1 tablet (10 mg total) by mouth every 6 (six) hours as needed for nausea or vomiting. (Patient not taking: Reported on 07/18/2017) 30 tablet 0  . rosuvastatin (CRESTOR) 20 MG tablet Take 20 mg by mouth daily.    . Sodium Phosphates (ENEMA) 7-19 GM/118ML ENEM Place rectally.    . tamsulosin (FLOMAX) 0.4 MG CAPS capsule Take 1 capsule by mouth once.    . triamcinolone (KENALOG) 0.025 % ointment Apply 1 application 2 (two) times daily topically. 30 g 1   No current facility-administered medications for this visit.     SURGICAL HISTORY:  Past Surgical History:  Procedure Laterality Date  . CARDIAC CATHETERIZATION    . COLONOSCOPY W/ POLYPECTOMY    . CORONARY ARTERY BYPASS GRAFT     Dr Prescott Gum [facility] 07/2007  . CORONARY ARTERY BYPASS GRAFT     1st one was @ [facility] 26years ago 12/1991  . CRANIECTOMY FOR EXCISION OF BRAIN TUMOR INFRATENTORIAL / POSTERIOR FOSSA  08/05/2016   Dr Rebecka Apley  @ [facility]/Neurosurgery  . FLEXIBLE BRONCHOSCOPY  08/07/2016   Dr Gwenevere Ghazi  . IR FLUORO GUIDE PORT INSERTION RIGHT  11/18/2016  . IR THORACENTESIS ASP PLEURAL SPACE W/IMG GUIDE  03/09/2017  . IR US GUIDE VASC ACCESS RIGHT  11/18/2016  . LUNG BIOPSY N/A 10/14/2016   Procedure: LUNG BIOPSY;  Surgeon: Grace Isaac, MD;  Location: [place];  Service: Thoracic;  Laterality: N/A;  . VIDEO BRONCHOSCOPY WITH ENDOBRONCHIAL ULTRASOUND N/A 10/14/2016   Procedure:  VIDEO BRONCHOSCOPY WITH ENDOBRONCHIAL ULTRASOUND;  Surgeon: Grace Isaac, MD;  Location: [place];  Service: Thoracic;  Laterality: N/A;    REVIEW OF SYSTEMS:  Constitutional: positive for fatigue Eyes: negative Ears, nose, mouth, throat, and face: negative Respiratory: positive for dyspnea on exertion Cardiovascular: negative Gastrointestinal: negative Genitourinary:negative Integument/breast: negative Hematologic/lymphatic: negative Musculoskeletal:positive for muscle weakness Neurological: negative Behavioral/Psych: negative Endocrine: negative Allergic/Immunologic: negative   PHYSICAL EXAMINATION: General appearance: alert, cooperative, fatigued and no distress Head: Normocephalic, without obvious abnormality, atraumatic Neck: no adenopathy, no JVD, supple, symmetrical, trachea midline and thyroid not enlarged, symmetric, no tenderness/mass/nodules Lymph nodes: Cervical, supraclavicular, and axillary nodes normal. Resp: diminished breath sounds bilaterally and dullness to percussion  bilaterally Back: symmetric, no curvature. ROM normal. No CVA tenderness. Cardio: regular rate and rhythm, S1, S2 normal, no murmur, click, rub or gallop GI: soft, non-tender; bowel sounds normal; no masses,  no organomegaly Extremities: extremities normal, atraumatic, no cyanosis or edema Neurologic: Alert and oriented X 3, normal strength and tone. Normal symmetric reflexes. Normal coordination and gait  ECOG PERFORMANCE STATUS: 1 - Symptomatic but completely ambulatory  Blood pressure 109/63, pulse (!) 52, temperature (!) 97.5 F (36.4 C), temperature source Oral, resp. rate 18, height _0  (1.803 m), weight 148 lb 14.4 oz (67.5 kg), SpO2 (!) 82 %.  LABORATORY DATA: Lab Results  Component Value Date   WBC 9.0 09/19/2017   HGB 12.2 (L) 09/19/2017   HCT 36.7 (L) 09/19/2017   MCV 98.6 (H) 09/19/2017   PLT 129 (L) 09/19/2017      Chemistry      Component Value Date/Time   NA 139  08/08/2017 1000   NA 138 02/08/2017 1118   K 4.2 08/08/2017 1000   K 4.5 02/08/2017 1118   CL 105 08/08/2017 1000   CO2 28 08/08/2017 1000   CO2 24 02/08/2017 1118   BUN 25 (H) 08/08/2017 1000   BUN 16.4 02/08/2017 1118   CREATININE 0.82 08/08/2017 1000   CREATININE 0.9 02/08/2017 1118      Component Value Date/Time   CALCIUM 9.2 08/08/2017 1000   CALCIUM 9.0 02/08/2017 1118   ALKPHOS 62 08/08/2017 1000   ALKPHOS 72 02/08/2017 1118   AST 20 08/08/2017 1000   AST 19 02/08/2017 1118   ALT 16 08/08/2017 1000   ALT 15 02/08/2017 1118   BILITOT 0.4 08/08/2017 1000   BILITOT 0.53 02/08/2017 1118       RADIOGRAPHIC STUDIES: No results found.  ASSESSMENT AND PLAN: This is a very pleasant 84 years old white male with metastatic non-small cell lung cancer, squamous cell carcinoma with negative PDL 1 expression and enlarging solitary brain metastasis.  The patient completed palliative radiotherapy to the brain metastasis. He completed systemic chemotherapy with carboplatin, paclitaxel and Keytruda status post 4 cycles.  He has no evidence for disease progression with this treatment. The patient is currently undergoing treatment with single agent Keytruda status post 10 cycles.   He has been tolerating this treatment well. He has been off treatment for several weeks now because of recent hospitalization and shortness of breath. He is feeling much better. I recommended for the patient to proceed with cycle #11 today. For the pleural effusion, I will arrange for the patient to have chest x-ray performed today and if he has significant pleural effusion, we will consider him for ultrasound-guided thoracentesis. We will also check the patient oxygen saturation with exercise and if needed will provide him with home oxygen. He will come back for follow-up visit in 3 weeks for evaluation before starting cycle #12. He was advised to call immediately if he has any concerning symptoms in the  interval. The patient voices understanding of current disease status and treatment options and is in agreement with the current care plan. All questions were answered. The patient knows to call the clinic with any problems, questions or concerns. We can certainly see the patient much sooner if necessary.  Disclaimer: This note was dictated with voice recognition software. Similar sounding words can inadvertently be transcribed and may not be corrected upon review.

## 2017-09-19 NOTE — Telephone Encounter (Signed)
Scheduled appt per 8/13 los  - pt to get an updated schedule next visit.

## 2017-09-29 ENCOUNTER — Encounter: Payer: Self-pay | Admitting: Radiation Oncology

## 2017-10-10 ENCOUNTER — Other Ambulatory Visit: Payer: Self-pay | Admitting: *Deleted

## 2017-10-10 ENCOUNTER — Inpatient Hospital Stay: Payer: Medicare Other | Admitting: Oncology

## 2017-10-10 ENCOUNTER — Inpatient Hospital Stay: Payer: Medicare Other

## 2017-10-10 ENCOUNTER — Ambulatory Visit: Payer: Medicare Other | Admitting: Urology

## 2017-10-10 ENCOUNTER — Telehealth: Payer: Self-pay | Admitting: *Deleted

## 2017-10-10 DIAGNOSIS — R0602 Shortness of breath: Secondary | ICD-10-CM

## 2017-10-10 MED ORDER — PANTOPRAZOLE SODIUM 40 MG PO TBEC
40.00 | DELAYED_RELEASE_TABLET | ORAL | Status: DC
Start: 2017-10-10 — End: 2017-10-10

## 2017-10-10 MED ORDER — SODIUM CHLORIDE 0.9 % IV SOLN
INTRAVENOUS | Status: DC
Start: ? — End: 2017-10-10

## 2017-10-10 MED ORDER — GENERIC EXTERNAL MEDICATION
1.00 | Status: DC
Start: ? — End: 2017-10-10

## 2017-10-10 MED ORDER — ALBUMIN HUMAN 25 % IV SOLN
25.00 g | INTRAVENOUS | Status: DC
Start: 2017-10-09 — End: 2017-10-10

## 2017-10-10 MED ORDER — MIRTAZAPINE 15 MG PO TABS
15.00 | ORAL_TABLET | ORAL | Status: DC
Start: 2017-10-09 — End: 2017-10-10

## 2017-10-10 MED ORDER — TAMSULOSIN HCL 0.4 MG PO CAPS
0.40 | ORAL_CAPSULE | ORAL | Status: DC
Start: 2017-10-09 — End: 2017-10-10

## 2017-10-10 MED ORDER — GENERIC EXTERNAL MEDICATION
17.00 g | Status: DC
Start: ? — End: 2017-10-10

## 2017-10-10 MED ORDER — ALBUTEROL SULFATE HFA 108 (90 BASE) MCG/ACT IN AERS
2.00 | INHALATION_SPRAY | RESPIRATORY_TRACT | Status: DC
Start: ? — End: 2017-10-10

## 2017-10-10 MED ORDER — GENERIC EXTERNAL MEDICATION
500.00 | Status: DC
Start: ? — End: 2017-10-10

## 2017-10-10 MED ORDER — DEXTROSE 10 % IV SOLN
125.00 | INTRAVENOUS | Status: DC
Start: ? — End: 2017-10-10

## 2017-10-10 MED ORDER — ALBUTEROL SULFATE (2.5 MG/3ML) 0.083% IN NEBU
2.50 | INHALATION_SOLUTION | RESPIRATORY_TRACT | Status: DC
Start: ? — End: 2017-10-10

## 2017-10-10 MED ORDER — GABAPENTIN 100 MG PO CAPS
200.00 | ORAL_CAPSULE | ORAL | Status: DC
Start: 2017-10-09 — End: 2017-10-10

## 2017-10-10 MED ORDER — GLUCOSE 40 % PO GEL
15.00 g | ORAL | Status: DC
Start: ? — End: 2017-10-10

## 2017-10-10 MED ORDER — INSULIN LISPRO 100 UNIT/ML ~~LOC~~ SOLN
0.00 | SUBCUTANEOUS | Status: DC
Start: 2017-10-09 — End: 2017-10-10

## 2017-10-10 MED ORDER — CARVEDILOL 12.5 MG PO TABS
25.00 | ORAL_TABLET | ORAL | Status: DC
Start: 2017-10-09 — End: 2017-10-10

## 2017-10-10 NOTE — Assessment & Plan Note (Deleted)
This is a very pleasant 84 years old white male with metastatic non-small cell lung cancer, squamous cell carcinoma with negative PDL 1 expression and enlarging solitary brain metastasis.  The patient completed palliative radiotherapy to the brain metastasis. He completed systemic chemotherapy with carboplatin, paclitaxel and Keytruda status post 4 cycles.  He has no evidence for disease progression with this treatment. The patient is currently undergoing treatment with single agent Keytruda status post 10 cycles.   He has been tolerating this treatment well. He has been off treatment for several weeks now because of recent hospitalization and shortness of breath. He is feeling much better. I recommended for the patient to proceed with cycle #11 today. For the pleural effusion, I will arrange for the patient to have chest x-ray performed today and if he has significant pleural effusion, we will consider him for ultrasound-guided thoracentesis. We will also check the patient oxygen saturation with exercise and if needed will provide him with home oxygen. He will come back for follow-up visit in 3 weeks for evaluation before starting cycle #12. He was advised to call immediately if he has any concerning symptoms in the interval. The patient voices understanding of current disease status and treatment options and is in agreement with the current care plan. All questions were answered. The patient knows to call the clinic with any problems, questions or concerns. We can certainly see the patient much sooner if necessary.

## 2017-10-10 NOTE — Progress Notes (Deleted)
[place] OFFICE PROGRESS NOTE  [name], MD [address] [place] [address]  DIAGNOSIS: Stage IV (T1b, N3, M1c) non-small cell lung cancer, squamous cell carcinoma diagnosed in September 2018 presented with left infrahilar mass in addition to mediastinal and supraclavicular lymphadenopathy as well as metastatic brain lesion.  PDL1 expression 0%.  PRIOR THERAPY:  1) Palliative radiotherapy to the brain metastasis. 2)  Systemic chemotherapy with carboplatin for AUC of 5, paclitaxel 175 MG/M2 and Ketruda (pembrolizumab) 200 MG IV every 3 weeks. First dose 11/21/2016.  Status post 4 cycles.  CURRENT THERAPY: Maintenance treatment with single agent Keytruda 200 mg IV every 3 weeks.  First cycle March 14, 2017.  Status post 10 cycles.  INTERVAL HISTORY: Steve Frank 84 y.o. male returns for *** regular *** visit for followup of ***   MEDICAL HISTORY: Past Medical History:  Diagnosis Date  . Arthritis   . BPH (benign prostatic hyperplasia)   . CAD (coronary artery disease)    s/p 2 cabg's  . Cancer (HCC)    Basal and squamous cell  skin cancer  . Cerebellar mass   . Chest mass   . Diabetes ([place])    Type II  . GERD (gastroesophageal reflux disease)   . Head injury with loss of consciousness [facility])    age 55 or 74-   . Heart attack ([place])   . HLD (hyperlipidemia)   . Hypertension   . Macular degeneration   . Pneumonia     x 2 last 1071  . Stage IV squamous cell carcinoma of left lung ([place]) 10/20/2016    ALLERGIES:  is allergic to ciprofloxacin.  MEDICATIONS:  Current Outpatient Medications  Medication Sig Dispense Refill  . acetaminophen (TYLENOL) 325 MG tablet Take 650 mg by mouth every 6 (six) hours as needed (for pain/headaches.).     Marland Kitchen albuterol (PROVENTIL HFA;VENTOLIN HFA) 108 (90 Base) MCG/ACT inhaler Inhale 2 puffs into the lungs every 6 (six) hours as needed (for wheezing/shortness of breath).     . B-D INS SYR ULTRAFINE 1CC/31G 31G  X 5/16" 1 ML MISC     . bisacodyl (DULCOLAX) 10 MG suppository Place rectally.    . carvedilol (COREG) 25 MG tablet Take by mouth.    . Cholecalciferol (VITAMIN D-1000 MAX ST) 1000 units tablet Take 1,000 Units by mouth daily.     . clotrimazole-betamethasone (LOTRISONE) cream Apply 1 application topically 2 (two) times daily.    Marland Kitchen docusate sodium (COLACE) 100 MG capsule Take 100 mg by mouth 2 (two) times daily as needed (for constipation.).    Marland Kitchen furosemide (LASIX) 20 MG tablet Take by mouth.    . gabapentin (NEURONTIN) 400 MG capsule Take 400 mg by mouth at bedtime.    Marland Kitchen guaiFENesin-dextromethorphan (ROBITUSSIN DM) 100-10 MG/5ML syrup Take 15 mLs every 4 (four) hours as needed by mouth for cough.    Marland Kitchen HUMALOG 100 UNIT/ML injection INJECT 2 TO 0.12MLS (2-8 UNITS) UNDER THE SKIN 3 TIMES A DAY BEFORE MEALS, AS PER SLIDING SCALE  0  . Lancets (FREESTYLE) lancets     . LANTUS 100 UNIT/ML injection INJECT 0.15ML (15 UNITS TOTAL) UNDER THE SKIN DAILY AT BEDTIME.  0  . lidocaine-prilocaine (EMLA) cream Apply 1 application topically as needed. 30 g 0  . magnesium hydroxide (MILK OF MAGNESIA) 400 MG/5ML suspension Take 30 mLs by mouth daily as needed (for constipation.).    Marland Kitchen mirtazapine (REMERON) 15 MG tablet Take 15 mg by mouth  at bedtime.  0  . Multiple Vitamin (MULTIVITAMIN WITH MINERALS) TABS tablet Take 1 tablet by mouth daily.    Marland Kitchen neomycin-bacitracin-polymyxin (NEOSPORIN) [phone] ointment Apply 1 application topically at bedtime.    Marland Kitchen omeprazole (PRILOSEC) 20 MG capsule Take 20 mg by mouth at bedtime.     . polyethylene glycol (MIRALAX / GLYCOLAX) packet Take 17 g by mouth daily as needed (for constipation.).     Marland Kitchen prochlorperazine (COMPAZINE) 10 MG tablet Take 1 tablet (10 mg total) by mouth every 6 (six) hours as needed for nausea or vomiting. (Patient not taking: Reported on 07/18/2017) 30 tablet 0  . rosuvastatin (CRESTOR) 20 MG tablet Take 20 mg by mouth daily.    . Sodium Phosphates  (ENEMA) 7-19 GM/118ML ENEM Place rectally.    . tamsulosin (FLOMAX) 0.4 MG CAPS capsule Take 1 capsule by mouth once.    . triamcinolone (KENALOG) 0.025 % ointment Apply 1 application 2 (two) times daily topically. 30 g 1   No current facility-administered medications for this visit.     SURGICAL HISTORY:  Past Surgical History:  Procedure Laterality Date  . CARDIAC CATHETERIZATION    . COLONOSCOPY W/ POLYPECTOMY    . CORONARY ARTERY BYPASS GRAFT     Dr Prescott Gum [facility] 07/2007  . CORONARY ARTERY BYPASS GRAFT     1st one was @ [facility] 26years ago 12/1991  . CRANIECTOMY FOR EXCISION OF BRAIN TUMOR INFRATENTORIAL / POSTERIOR FOSSA  08/05/2016   Dr Rebecka Apley  @ [facility]/Neurosurgery  . FLEXIBLE BRONCHOSCOPY  08/07/2016   Dr Gwenevere Ghazi  . IR FLUORO GUIDE PORT INSERTION RIGHT  11/18/2016  . IR THORACENTESIS ASP PLEURAL SPACE W/IMG GUIDE  03/09/2017  . IR US GUIDE VASC ACCESS RIGHT  11/18/2016  . LUNG BIOPSY N/A 10/14/2016   Procedure: LUNG BIOPSY;  Surgeon: Grace Isaac, MD;  Location: [place];  Service: Thoracic;  Laterality: N/A;  . VIDEO BRONCHOSCOPY WITH ENDOBRONCHIAL ULTRASOUND N/A 10/14/2016   Procedure: VIDEO BRONCHOSCOPY WITH ENDOBRONCHIAL ULTRASOUND;  Surgeon: Grace Isaac, MD;  Location: [place];  Service: Thoracic;  Laterality: N/A;    REVIEW OF SYSTEMS:   Review of Systems  Constitutional: Negative for appetite change, chills, fatigue, fever and unexpected weight change.  HENT:   Negative for mouth sores, nosebleeds, sore throat and trouble swallowing.   Eyes: Negative for eye problems and icterus.  Respiratory: Negative for cough, hemoptysis, shortness of breath and wheezing.   Cardiovascular: Negative for chest pain and leg swelling.  Gastrointestinal: Negative for abdominal pain, constipation, diarrhea, nausea and vomiting.  Genitourinary: Negative for bladder incontinence, difficulty urinating, dysuria, frequency and hematuria.   Musculoskeletal: Negative for  back pain, gait problem, neck pain and neck stiffness.  Skin: Negative for itching and rash.  Neurological: Negative for dizziness, extremity weakness, gait problem, headaches, light-headedness and seizures.  Hematological: Negative for adenopathy. Does not bruise/bleed easily.  Psychiatric/Behavioral: Negative for confusion, depression and sleep disturbance. The patient is not nervous/anxious.     PHYSICAL EXAMINATION:  There were no vitals taken for this visit.  ECOG PERFORMANCE STATUS: {CHL ONC ECOG [id number]  Physical Exam  Constitutional: Oriented to person, place, and time and well-developed, well-nourished, and in no distress. No distress.  HENT:  Head: Normocephalic and atraumatic.  Mouth/Throat: Oropharynx is clear and moist. No oropharyngeal exudate.  Eyes: Conjunctivae are normal. Right eye exhibits no discharge. Left eye exhibits no discharge. No scleral icterus.  Neck: Normal range of motion. Neck supple.  Cardiovascular: Normal  rate, regular rhythm, normal heart sounds and intact distal pulses.   Pulmonary/Chest: Effort normal and breath sounds normal. No respiratory distress. No wheezes. No rales.  Abdominal: Soft. Bowel sounds are normal. Exhibits no distension and no mass. There is no tenderness.  Musculoskeletal: Normal range of motion. Exhibits no edema.  Lymphadenopathy:    No cervical adenopathy.  Neurological: Alert and oriented to person, place, and time. Exhibits normal muscle tone. Gait normal. Coordination normal.  Skin: Skin is warm and dry. No rash noted. Not diaphoretic. No erythema. No pallor.  Psychiatric: Mood, memory and judgment normal.  Vitals reviewed.  LABORATORY DATA: Lab Results  Component Value Date   WBC 9.0 09/19/2017   HGB 12.2 (L) 09/19/2017   HCT 36.7 (L) 09/19/2017   MCV 98.6 (H) 09/19/2017   PLT 129 (L) 09/19/2017      Chemistry      Component Value Date/Time   NA 141 09/19/2017 0956   NA 138 02/08/2017 1118   K 4.0  09/19/2017 0956   K 4.5 02/08/2017 1118   CL 104 09/19/2017 0956   CO2 25 09/19/2017 0956   CO2 24 02/08/2017 1118   BUN 33 (H) 09/19/2017 0956   BUN 16.4 02/08/2017 1118   CREATININE 0.98 09/19/2017 0956   CREATININE 0.9 02/08/2017 1118      Component Value Date/Time   CALCIUM 8.4 (L) 09/19/2017 0956   CALCIUM 9.0 02/08/2017 1118   ALKPHOS 66 09/19/2017 0956   ALKPHOS 72 02/08/2017 1118   AST 22 09/19/2017 0956   AST 19 02/08/2017 1118   ALT 19 09/19/2017 0956   ALT 15 02/08/2017 1118   BILITOT 0.5 09/19/2017 0956   BILITOT 0.53 02/08/2017 1118       RADIOGRAPHIC STUDIES:  Dg Chest 1 View  Result Date: 09/19/2017 CLINICAL DATA:  84 year old male with stage IV pulmonary squamous cell carcinoma. Pleural effusion. EXAM: CHEST  1 VIEW COMPARISON:  08/28/2017 and earlier. FINDINGS: PA view of the chest. Stable right chest power port, no longer accessed. Sequelae of CABG. Stable mediastinal contours. No pneumothorax or pulmonary edema. Small to moderate left and small right pleural effusions have not significantly changed since 08/28/2017. Stable confluent bibasilar opacity. No new pulmonary opacity. Negative visible bowel gas pattern. Stable visualized osseous structures. IMPRESSION: Stable small to moderate left and small right pleural effusions since 08/28/2017. Electronically Signed   By: Genevie Ann M.D.   On: 09/19/2017 14:23     ASSESSMENT/PLAN:  No problem-specific Assessment & Plan notes found for this encounter.   No orders of the defined types were placed in this encounter.    Mikey Bussing, DNP, AGPCNP-BC, AOCNP 10/10/17

## 2017-10-10 NOTE — Telephone Encounter (Signed)
CALLED PATIENT TO RESCHEDULE FU FOR 10-10-17, PATIENT AGREED TO COME ON 10-20-17 @ 2:30 PM

## 2017-10-10 NOTE — Progress Notes (Signed)
Called patient to check on since no show for appt today, spoke with wife.  She advised she had left message to cancel.  Patient was having a hard time breathing this past Friday night and EMS arrived his saturations were 66% and he was taken to [facility].  They removed 2 liters of fluid and he was just discharged yesterday.  He states he didn't feel up to coming in for todays appt.    His current Oxygen saturations today were 74% on 2 liters.  His wife states she called the PCP and they advised he could increase oxygen to 3.5 liters and he has improved to 93%.  They are unclear of goal saturations and are aware of the negative effects of stating on high levels of oxygen.  Patient does not have a pulmonologist established.  Made appt with [facility] [place] for first available 10/26/17 @ 3:15 with Dr Lake Bells.  They need clearly defined oxygen saturation goals and when to seek medical attention if they are unable to obtain sufficient levels at home.  Per Mikey Bussing, NP current goals until new patient appt with Pulmonologist is greater than 90% with no more than 4 liters of oxygen.  If he should feel short of breath, he should seek emergency medical treatment especially since he had to have fluid removed to improve levels this past weekend.    Spoke with wife Tana Conch.  No questions or complaints at this time.

## 2017-10-13 NOTE — Progress Notes (Deleted)
[place] OFFICE PROGRESS NOTE  [name], MD [address] [place] [address]  DIAGNOSIS: Stage IV (T1b, N3, M1c) non-small cell lung cancer, squamous cell carcinoma diagnosed in September 2018 presented with left infrahilar mass in addition to mediastinal and supraclavicular lymphadenopathy as well as metastatic brain lesion.  PDL1 expression 0%.  PRIOR THERAPY:  1) Palliative radiotherapy to the brain metastasis. 2)  Systemic chemotherapy with carboplatin for AUC of 5, paclitaxel 175 MG/M2 and Ketruda (pembrolizumab) 200 MG IV every 3 weeks. First dose 11/21/2016.  Status post 4 cycles.  CURRENT THERAPY: Maintenance treatment with single agent Keytruda 200 mg IV every 3 weeks.  First cycle March 14, 2017.  Status post 11 cycles.  INTERVAL HISTORY: Steve Frank 84 y.o. male returns for *** regular *** visit for followup of ***   MEDICAL HISTORY: Past Medical History:  Diagnosis Date  . Arthritis   . BPH (benign prostatic hyperplasia)   . CAD (coronary artery disease)    s/p 2 cabg's  . Cancer (HCC)    Basal and squamous cell  skin cancer  . Cerebellar mass   . Chest mass   . Diabetes ([place])    Type II  . GERD (gastroesophageal reflux disease)   . Head injury with loss of consciousness [facility])    age 74 or 65-   . Heart attack ([place])   . HLD (hyperlipidemia)   . Hypertension   . Macular degeneration   . Pneumonia     x 2 last 1071  . Stage IV squamous cell carcinoma of left lung ([place]) 10/20/2016    ALLERGIES:  is allergic to ciprofloxacin.  MEDICATIONS:  Current Outpatient Medications  Medication Sig Dispense Refill  . acetaminophen (TYLENOL) 325 MG tablet Take 650 mg by mouth every 6 (six) hours as needed (for pain/headaches.).     Marland Kitchen albuterol (PROVENTIL HFA;VENTOLIN HFA) 108 (90 Base) MCG/ACT inhaler Inhale 2 puffs into the lungs every 6 (six) hours as needed (for wheezing/shortness of breath).     . B-D INS SYR ULTRAFINE 1CC/31G 31G  X 5/16" 1 ML MISC     . bisacodyl (DULCOLAX) 10 MG suppository Place rectally.    . carvedilol (COREG) 25 MG tablet Take by mouth.    . Cholecalciferol (VITAMIN D-1000 MAX ST) 1000 units tablet Take 1,000 Units by mouth daily.     . clotrimazole-betamethasone (LOTRISONE) cream Apply 1 application topically 2 (two) times daily.    Marland Kitchen docusate sodium (COLACE) 100 MG capsule Take 100 mg by mouth 2 (two) times daily as needed (for constipation.).    Marland Kitchen furosemide (LASIX) 20 MG tablet Take by mouth.    . gabapentin (NEURONTIN) 400 MG capsule Take 400 mg by mouth at bedtime.    Marland Kitchen guaiFENesin-dextromethorphan (ROBITUSSIN DM) 100-10 MG/5ML syrup Take 15 mLs every 4 (four) hours as needed by mouth for cough.    Marland Kitchen HUMALOG 100 UNIT/ML injection INJECT 2 TO 0.12MLS (2-8 UNITS) UNDER THE SKIN 3 TIMES A DAY BEFORE MEALS, AS PER SLIDING SCALE  0  . Lancets (FREESTYLE) lancets     . LANTUS 100 UNIT/ML injection INJECT 0.15ML (15 UNITS TOTAL) UNDER THE SKIN DAILY AT BEDTIME.  0  . lidocaine-prilocaine (EMLA) cream Apply 1 application topically as needed. 30 g 0  . magnesium hydroxide (MILK OF MAGNESIA) 400 MG/5ML suspension Take 30 mLs by mouth daily as needed (for constipation.).    Marland Kitchen mirtazapine (REMERON) 15 MG tablet Take 15 mg by mouth  at bedtime.  0  . Multiple Vitamin (MULTIVITAMIN WITH MINERALS) TABS tablet Take 1 tablet by mouth daily.    Marland Kitchen neomycin-bacitracin-polymyxin (NEOSPORIN) [phone] ointment Apply 1 application topically at bedtime.    Marland Kitchen omeprazole (PRILOSEC) 20 MG capsule Take 20 mg by mouth at bedtime.     . polyethylene glycol (MIRALAX / GLYCOLAX) packet Take 17 g by mouth daily as needed (for constipation.).     Marland Kitchen prochlorperazine (COMPAZINE) 10 MG tablet Take 1 tablet (10 mg total) by mouth every 6 (six) hours as needed for nausea or vomiting. (Patient not taking: Reported on 07/18/2017) 30 tablet 0  . rosuvastatin (CRESTOR) 20 MG tablet Take 20 mg by mouth daily.    . Sodium Phosphates  (ENEMA) 7-19 GM/118ML ENEM Place rectally.    . tamsulosin (FLOMAX) 0.4 MG CAPS capsule Take 1 capsule by mouth once.    . triamcinolone (KENALOG) 0.025 % ointment Apply 1 application 2 (two) times daily topically. 30 g 1   No current facility-administered medications for this visit.     SURGICAL HISTORY:  Past Surgical History:  Procedure Laterality Date  . CARDIAC CATHETERIZATION    . COLONOSCOPY W/ POLYPECTOMY    . CORONARY ARTERY BYPASS GRAFT     Dr Prescott Gum [facility] 07/2007  . CORONARY ARTERY BYPASS GRAFT     1st one was @ [facility] 26years ago 12/1991  . CRANIECTOMY FOR EXCISION OF BRAIN TUMOR INFRATENTORIAL / POSTERIOR FOSSA  08/05/2016   Dr Rebecka Apley  @ [facility]/Neurosurgery  . FLEXIBLE BRONCHOSCOPY  08/07/2016   Dr Gwenevere Ghazi  . IR FLUORO GUIDE PORT INSERTION RIGHT  11/18/2016  . IR THORACENTESIS ASP PLEURAL SPACE W/IMG GUIDE  03/09/2017  . IR US GUIDE VASC ACCESS RIGHT  11/18/2016  . LUNG BIOPSY N/A 10/14/2016   Procedure: LUNG BIOPSY;  Surgeon: Grace Isaac, MD;  Location: [place];  Service: Thoracic;  Laterality: N/A;  . VIDEO BRONCHOSCOPY WITH ENDOBRONCHIAL ULTRASOUND N/A 10/14/2016   Procedure: VIDEO BRONCHOSCOPY WITH ENDOBRONCHIAL ULTRASOUND;  Surgeon: Grace Isaac, MD;  Location: [place];  Service: Thoracic;  Laterality: N/A;    REVIEW OF SYSTEMS:   Review of Systems  Constitutional: Negative for appetite change, chills, fatigue, fever and unexpected weight change.  HENT:   Negative for mouth sores, nosebleeds, sore throat and trouble swallowing.   Eyes: Negative for eye problems and icterus.  Respiratory: Negative for cough, hemoptysis, shortness of breath and wheezing.   Cardiovascular: Negative for chest pain and leg swelling.  Gastrointestinal: Negative for abdominal pain, constipation, diarrhea, nausea and vomiting.  Genitourinary: Negative for bladder incontinence, difficulty urinating, dysuria, frequency and hematuria.   Musculoskeletal: Negative for  back pain, gait problem, neck pain and neck stiffness.  Skin: Negative for itching and rash.  Neurological: Negative for dizziness, extremity weakness, gait problem, headaches, light-headedness and seizures.  Hematological: Negative for adenopathy. Does not bruise/bleed easily.  Psychiatric/Behavioral: Negative for confusion, depression and sleep disturbance. The patient is not nervous/anxious.     PHYSICAL EXAMINATION:  There were no vitals taken for this visit.  ECOG PERFORMANCE STATUS: 1 - Symptomatic but completely ambulatory  Physical Exam  Constitutional: Oriented to person, place, and time and well-developed, well-nourished, and in no distress. No distress.  HENT:  Head: Normocephalic and atraumatic.  Mouth/Throat: Oropharynx is clear and moist. No oropharyngeal exudate.  Eyes: Conjunctivae are normal. Right eye exhibits no discharge. Left eye exhibits no discharge. No scleral icterus.  Neck: Normal range of motion. Neck supple.  Cardiovascular: Normal rate, regular rhythm, normal heart sounds and intact distal pulses.   Pulmonary/Chest: Effort normal and breath sounds normal. No respiratory distress. No wheezes. No rales.  Abdominal: Soft. Bowel sounds are normal. Exhibits no distension and no mass. There is no tenderness.  Musculoskeletal: Normal range of motion. Exhibits no edema.  Lymphadenopathy:    No cervical adenopathy.  Neurological: Alert and oriented to person, place, and time. Exhibits normal muscle tone. Gait normal. Coordination normal.  Skin: Skin is warm and dry. No rash noted. Not diaphoretic. No erythema. No pallor.  Psychiatric: Mood, memory and judgment normal.  Vitals reviewed.  LABORATORY DATA: Lab Results  Component Value Date   WBC 9.0 09/19/2017   HGB 12.2 (L) 09/19/2017   HCT 36.7 (L) 09/19/2017   MCV 98.6 (H) 09/19/2017   PLT 129 (L) 09/19/2017      Chemistry      Component Value Date/Time   NA 141 09/19/2017 0956   NA 138 02/08/2017 1118    K 4.0 09/19/2017 0956   K 4.5 02/08/2017 1118   CL 104 09/19/2017 0956   CO2 25 09/19/2017 0956   CO2 24 02/08/2017 1118   BUN 33 (H) 09/19/2017 0956   BUN 16.4 02/08/2017 1118   CREATININE 0.98 09/19/2017 0956   CREATININE 0.9 02/08/2017 1118      Component Value Date/Time   CALCIUM 8.4 (L) 09/19/2017 0956   CALCIUM 9.0 02/08/2017 1118   ALKPHOS 66 09/19/2017 0956   ALKPHOS 72 02/08/2017 1118   AST 22 09/19/2017 0956   AST 19 02/08/2017 1118   ALT 19 09/19/2017 0956   ALT 15 02/08/2017 1118   BILITOT 0.5 09/19/2017 0956   BILITOT 0.53 02/08/2017 1118       RADIOGRAPHIC STUDIES:  Dg Chest 1 View  Result Date: 09/19/2017 CLINICAL DATA:  84 year old male with stage IV pulmonary squamous cell carcinoma. Pleural effusion. EXAM: CHEST  1 VIEW COMPARISON:  08/28/2017 and earlier. FINDINGS: PA view of the chest. Stable right chest power port, no longer accessed. Sequelae of CABG. Stable mediastinal contours. No pneumothorax or pulmonary edema. Small to moderate left and small right pleural effusions have not significantly changed since 08/28/2017. Stable confluent bibasilar opacity. No new pulmonary opacity. Negative visible bowel gas pattern. Stable visualized osseous structures. IMPRESSION: Stable small to moderate left and small right pleural effusions since 08/28/2017. Electronically Signed   By: Genevie Ann M.D.   On: 09/19/2017 14:23     ASSESSMENT/PLAN:  No problem-specific Assessment & Plan notes found for this encounter.   No orders of the defined types were placed in this encounter.    Mikey Bussing, DNP, AGPCNP-BC, AOCNP 10/13/17

## 2017-10-13 NOTE — Assessment & Plan Note (Deleted)
This is a very pleasant 84 years old white male with metastatic non-small cell lung cancer, squamous cell carcinoma with negative PDL 1 expression and enlarging solitary brain metastasis.  The patient completed palliative radiotherapy to the brain metastasis. He completed systemic chemotherapy with carboplatin, paclitaxel and Keytruda status post 4 cycles.  He has no evidence for disease progression with this treatment. The patient is currently undergoing treatment with single agent Keytruda status post 10 cycles.   He has been tolerating this treatment well. He has been off treatment for several weeks now because of recent hospitalization and shortness of breath. He is feeling much better. I recommended for the patient to proceed with cycle #11 today. For the pleural effusion, I will arrange for the patient to have chest x-ray performed today and if he has significant pleural effusion, we will consider him for ultrasound-guided thoracentesis. We will also check the patient oxygen saturation with exercise and if needed will provide him with home oxygen. He will come back for follow-up visit in 3 weeks for evaluation before starting cycle #12. He was advised to call immediately if he has any concerning symptoms in the interval. The patient voices understanding of current disease status and treatment options and is in agreement with the current care plan. All questions were answered. The patient knows to call the clinic with any problems, questions or concerns. We can certainly see the patient much sooner if necessary.

## 2017-10-16 ENCOUNTER — Inpatient Hospital Stay: Payer: Medicare Other

## 2017-10-16 ENCOUNTER — Inpatient Hospital Stay: Payer: Medicare Other | Admitting: Oncology

## 2017-10-16 ENCOUNTER — Inpatient Hospital Stay: Payer: Medicare Other | Attending: Internal Medicine

## 2017-10-16 DIAGNOSIS — Z79899 Other long term (current) drug therapy: Secondary | ICD-10-CM | POA: Insufficient documentation

## 2017-10-16 DIAGNOSIS — Z5112 Encounter for antineoplastic immunotherapy: Secondary | ICD-10-CM | POA: Insufficient documentation

## 2017-10-16 DIAGNOSIS — I10 Essential (primary) hypertension: Secondary | ICD-10-CM | POA: Insufficient documentation

## 2017-10-16 DIAGNOSIS — C7931 Secondary malignant neoplasm of brain: Secondary | ICD-10-CM | POA: Insufficient documentation

## 2017-10-16 DIAGNOSIS — C3412 Malignant neoplasm of upper lobe, left bronchus or lung: Secondary | ICD-10-CM | POA: Insufficient documentation

## 2017-10-16 DIAGNOSIS — J90 Pleural effusion, not elsewhere classified: Secondary | ICD-10-CM | POA: Insufficient documentation

## 2017-10-16 MED ORDER — GENERIC EXTERNAL MEDICATION
600.00 | Status: DC
Start: 2017-10-16 — End: 2017-10-16

## 2017-10-16 MED ORDER — GLUCOSE 40 % PO GEL
15.00 g | ORAL | Status: DC
Start: ? — End: 2017-10-16

## 2017-10-16 MED ORDER — POLYETHYLENE GLYCOL 3350 17 G PO PACK
17.00 g | PACK | ORAL | Status: DC
Start: ? — End: 2017-10-16

## 2017-10-16 MED ORDER — DIPHENHYDRAMINE HCL 50 MG/ML IJ SOLN
25.00 | INTRAMUSCULAR | Status: DC
Start: ? — End: 2017-10-16

## 2017-10-16 MED ORDER — FLUTICASONE FUROATE-VILANTEROL 100-25 MCG/INH IN AEPB
1.00 | INHALATION_SPRAY | RESPIRATORY_TRACT | Status: DC
Start: 2017-10-21 — End: 2017-10-16

## 2017-10-16 MED ORDER — EUCERIN EX CREA
TOPICAL_CREAM | CUTANEOUS | Status: DC
Start: ? — End: 2017-10-16

## 2017-10-16 MED ORDER — ALBUTEROL SULFATE (2.5 MG/3ML) 0.083% IN NEBU
2.50 | INHALATION_SOLUTION | RESPIRATORY_TRACT | Status: DC
Start: ? — End: 2017-10-16

## 2017-10-16 MED ORDER — GENERIC EXTERNAL MEDICATION
0.50 | Status: DC
Start: ? — End: 2017-10-16

## 2017-10-16 MED ORDER — GENERIC EXTERNAL MEDICATION
0.00 | Status: DC
Start: ? — End: 2017-10-16

## 2017-10-16 MED ORDER — PANTOPRAZOLE SODIUM 40 MG PO TBEC
40.00 | DELAYED_RELEASE_TABLET | ORAL | Status: DC
Start: 2017-10-21 — End: 2017-10-16

## 2017-10-16 MED ORDER — ONDANSETRON HCL 4 MG/2ML IJ SOLN
4.00 | INTRAMUSCULAR | Status: DC
Start: ? — End: 2017-10-16

## 2017-10-16 MED ORDER — LORAZEPAM 2 MG/ML IJ SOLN
1.00 | INTRAMUSCULAR | Status: DC
Start: ? — End: 2017-10-16

## 2017-10-16 MED ORDER — GENERIC EXTERNAL MEDICATION
15.00 | Status: DC
Start: 2017-10-20 — End: 2017-10-16

## 2017-10-16 MED ORDER — DEXTROSE 10 % IV SOLN
125.00 | INTRAVENOUS | Status: DC
Start: ? — End: 2017-10-16

## 2017-10-16 MED ORDER — MIRTAZAPINE 15 MG PO TABS
15.00 | ORAL_TABLET | ORAL | Status: DC
Start: 2017-10-20 — End: 2017-10-16

## 2017-10-16 MED ORDER — GUAIFENESIN 100 MG/5ML PO SYRP
200.00 | ORAL_SOLUTION | ORAL | Status: DC
Start: ? — End: 2017-10-16

## 2017-10-16 MED ORDER — ATORVASTATIN CALCIUM 40 MG PO TABS
80.00 | ORAL_TABLET | ORAL | Status: DC
Start: 2017-10-20 — End: 2017-10-16

## 2017-10-16 MED ORDER — TAMSULOSIN HCL 0.4 MG PO CAPS
0.40 | ORAL_CAPSULE | ORAL | Status: DC
Start: 2017-10-20 — End: 2017-10-16

## 2017-10-16 MED ORDER — GABAPENTIN 100 MG PO CAPS
200.00 | ORAL_CAPSULE | ORAL | Status: DC
Start: 2017-10-20 — End: 2017-10-16

## 2017-10-16 MED ORDER — ENOXAPARIN SODIUM 40 MG/0.4ML ~~LOC~~ SOLN
40.00 | SUBCUTANEOUS | Status: DC
Start: 2017-10-20 — End: 2017-10-16

## 2017-10-16 MED ORDER — INSULIN LISPRO 100 UNIT/ML ~~LOC~~ SOLN
1.00 | SUBCUTANEOUS | Status: DC
Start: 2017-10-20 — End: 2017-10-16

## 2017-10-16 MED ORDER — GENERIC EXTERNAL MEDICATION
5.00 | Status: DC
Start: ? — End: 2017-10-16

## 2017-10-16 MED ORDER — GENERIC EXTERNAL MEDICATION
1.00 | Status: DC
Start: ? — End: 2017-10-16

## 2017-10-16 MED ORDER — ACETAMINOPHEN 325 MG PO TABS
650.00 | ORAL_TABLET | ORAL | Status: DC
Start: ? — End: 2017-10-16

## 2017-10-16 MED ORDER — UMECLIDINIUM BROMIDE 62.5 MCG/INH IN AEPB
1.00 | INHALATION_SPRAY | RESPIRATORY_TRACT | Status: DC
Start: 2017-10-21 — End: 2017-10-16

## 2017-10-19 ENCOUNTER — Telehealth: Payer: Self-pay | Admitting: *Deleted

## 2017-10-19 ENCOUNTER — Telehealth: Payer: Self-pay | Admitting: Radiation Oncology

## 2017-10-19 NOTE — Progress Notes (Signed)
Received voicemail message from patient's wife, Tana Conch, that her husband is still hospitalized and will not be able to make 2 pm follow up appointment today. Informed staff and provider of these findings.

## 2017-10-19 NOTE — Telephone Encounter (Signed)
WIfe called with concerns regarding pt status and upcoming appts/treatments. Pt admitted to [facility] 9/5 for shortness of breath. Wife advised " they put a tube in his left lung , drain it everyday and they drained the right lung yesterday. The fluid is very bloody. They ( [facility]) told me he needs Hospcie," Discussed with wife, upon pt's d/c form hospital, MD will f/u with pt, discuss recent changes and plan going further. Informed wife to call office upon pt's discharge. Also discussed with wife, to request a hospice and palliative nurse to come and speak with the family about how they could better assist pt if Hospice is something they would like to pursue. Explained to wife, MD will see pt upon discharge to discuss plan going forward. Encouraged wife to call with any concerns.

## 2017-10-20 ENCOUNTER — Telehealth: Payer: Self-pay | Admitting: *Deleted

## 2017-10-20 ENCOUNTER — Ambulatory Visit: Payer: Medicare Other | Admitting: Urology

## 2017-10-20 MED ORDER — GENERIC EXTERNAL MEDICATION
Status: DC
Start: ? — End: 2017-10-20

## 2017-10-20 MED ORDER — GUAIFENESIN ER 600 MG PO TB12
600.00 | ORAL_TABLET | ORAL | Status: DC
Start: 2017-10-20 — End: 2017-10-20

## 2017-10-20 MED ORDER — POLYETHYLENE GLYCOL 3350 17 G PO PACK
17.00 g | PACK | ORAL | Status: DC
Start: 2017-10-21 — End: 2017-10-20

## 2017-10-20 MED ORDER — LORAZEPAM 2 MG/ML IJ SOLN
0.50 | INTRAMUSCULAR | Status: DC
Start: ? — End: 2017-10-20

## 2017-10-25 ENCOUNTER — Telehealth: Payer: Self-pay | Admitting: *Deleted

## 2017-10-25 NOTE — Telephone Encounter (Signed)
Call transferred from scheduling as pt requesting an appt with MD. Steve Frank pt chart, reviewed with pt appt for 9/24 for lab/flush/MD/infusion. Wife thanked me for the call. No further concerns.

## 2017-10-26 ENCOUNTER — Institutional Professional Consult (permissible substitution): Payer: Medicare Other | Admitting: Pulmonary Disease

## 2017-10-30 NOTE — Assessment & Plan Note (Addendum)
This is a very pleasant 84 year old white male with metastatic non-small cell lung cancer, squamous cell carcinoma with negative PDL 1 expression and enlarging solitary brain metastasis.  The patient completed palliative radiotherapy to the brain metastasis. He completed systemic chemotherapy with carboplatin, paclitaxel and Keytruda status post 4 cycles.  He has no evidence for disease progression with this treatment. The patient is currently undergoing treatment with single agent Keytruda status post 11 cycles.   He has been tolerating this treatment well. He has been off treatment for several weeks now because of recent hospitalization for shortness of breath and recurrent pleural effusions.  His breathing has improved somewhat.  The patient was seen with Dr. Julien Nordmann.  We discussed that he should proceed with cycle #12 of Keytruda today as scheduled.  We will obtain a restaging CT scan of the chest, abdomen, pelvis prior to his next visit. After review of the CT scan, will consider referral to cardiothoracic surgery for consideration of talc pleurodesis and possible Pleurx placement on the right side if he has significant pleural effusion on his CT scan. The patient was advised to call us if his breathing worsens and we can consider seeing him in our symptom management clinic and getting him set up for thoracentesis in the interim. He will follow-up in 3 weeks for evaluation prior to cycle #13 of his treatment and to review his restaging CT scan results.  He was advised to call immediately if he has any concerning symptoms in the interval. The patient voices understanding of current disease status and treatment options and is in agreement with the current care plan. All questions were answered. The patient knows to call the clinic with any problems, questions or concerns. We can certainly see the patient much sooner if necessary.

## 2017-10-30 NOTE — Progress Notes (Signed)
[facility] OFFICE PROGRESS NOTE  [name], MD [address] [place] [address]  DIAGNOSIS: Stage IV (T1b, N3, M1c) non-small cell lung cancer, squamous cell carcinoma diagnosed in September 2018 presented with left infrahilar mass in addition to mediastinal and supraclavicular lymphadenopathy as well as metastatic brain lesion.  PDL1 expression 0%.  PRIOR THERAPY:  1) Palliative radiotherapy to the brain metastasis. 2)  Systemic chemotherapy with carboplatin for AUC of 5, paclitaxel 175 MG/M2 and Ketruda (pembrolizumab) 200 MG IV every 3 weeks. First dose 11/21/2016.  Status post 4 cycles.  CURRENT THERAPY: Maintenance treatment with single agent Keytruda 200 mg IV every 3 weeks.  First cycle March 14, 2017.  Status post 11 cycles.  INTERVAL HISTORY: Steve Frank 84 y.o. male returns for routine follow-up visit accompanied by his wife and daughter.  The patient has been hospitalized several times at [facility] regional for hypoxia.  He also had recurrent pleural effusions.  A Pleurx catheter was placed in the left side.  He also underwent a thoracentesis of the right side.  No Pleurx catheter was placed on the right.  He reports that his somewhat better but he remains short of breath with exertion.  He is on O2 at 3 L/min.  He denies fevers and chills.  Denies chest pain and hemoptysis.  He has an ongoing cough.  Denies nausea, vomiting, constipation, diarrhea.  He reports that he has lost weight and his appetite is fair.  The patient is here for evaluation prior to cycle #12 of Keytruda.  MEDICAL HISTORY: Past Medical History:  Diagnosis Date  . Arthritis   . BPH (benign prostatic hyperplasia)   . CAD (coronary artery disease)    s/p 2 cabg's  . Cancer (HCC)    Basal and squamous cell  skin cancer  . Cerebellar mass   . Chest mass   . Diabetes ([place])    Type II  . GERD (gastroesophageal reflux disease)   . Head injury with loss of consciousness  [facility])    age 72 or 58-   . Heart attack ([place])   . HLD (hyperlipidemia)   . Hypertension   . Macular degeneration   . Pneumonia     x 2 last 1071  . Stage IV squamous cell carcinoma of left lung ([place]) 10/20/2016    ALLERGIES:  is allergic to ciprofloxacin.  MEDICATIONS:  Current Outpatient Medications  Medication Sig Dispense Refill  . acetaminophen (TYLENOL) 325 MG tablet Take 650 mg by mouth every 6 (six) hours as needed (for pain/headaches.).     Marland Kitchen albuterol (PROVENTIL HFA;VENTOLIN HFA) 108 (90 Base) MCG/ACT inhaler Inhale 2 puffs into the lungs every 6 (six) hours as needed (for wheezing/shortness of breath).     . B-D INS SYR ULTRAFINE 1CC/31G 31G X 5/16" 1 ML MISC     . bisacodyl (DULCOLAX) 10 MG suppository Place rectally.    . Cholecalciferol (VITAMIN D-1000 MAX ST) 1000 units tablet Take 1,000 Units by mouth daily.     . clotrimazole-betamethasone (LOTRISONE) cream Apply 1 application topically 2 (two) times daily.    Marland Kitchen docusate sodium (COLACE) 100 MG capsule Take 100 mg by mouth 2 (two) times daily as needed (for constipation.).    Marland Kitchen furosemide (LASIX) 20 MG tablet Take by mouth.    . gabapentin (NEURONTIN) 400 MG capsule Take 400 mg by mouth at bedtime.    Marland Kitchen guaiFENesin-dextromethorphan (ROBITUSSIN DM) 100-10 MG/5ML syrup Take 15 mLs every 4 (four)  hours as needed by mouth for cough.    Marland Kitchen HUMALOG 100 UNIT/ML injection INJECT 2 TO 0.12MLS (2-8 UNITS) UNDER THE SKIN 3 TIMES A DAY BEFORE MEALS, AS PER SLIDING SCALE  0  . Lancets (FREESTYLE) lancets     . LANTUS 100 UNIT/ML injection INJECT 0.15ML (15 UNITS TOTAL) UNDER THE SKIN DAILY AT BEDTIME.  0  . lidocaine-prilocaine (EMLA) cream Apply 1 application topically as needed. 30 g 0  . magnesium hydroxide (MILK OF MAGNESIA) 400 MG/5ML suspension Take 30 mLs by mouth daily as needed (for constipation.).    Marland Kitchen mirtazapine (REMERON) 15 MG tablet Take 15 mg by mouth at bedtime.  0  . Multiple Vitamin (MULTIVITAMIN WITH MINERALS) TABS  tablet Take 1 tablet by mouth daily.    Marland Kitchen neomycin-bacitracin-polymyxin (NEOSPORIN) [phone] ointment Apply 1 application topically at bedtime.    Marland Kitchen omeprazole (PRILOSEC) 20 MG capsule Take 20 mg by mouth at bedtime.     . polyethylene glycol (MIRALAX / GLYCOLAX) packet Take 17 g by mouth daily as needed (for constipation.).     Marland Kitchen prochlorperazine (COMPAZINE) 10 MG tablet Take 1 tablet (10 mg total) by mouth every 6 (six) hours as needed for nausea or vomiting. 30 tablet 0  . rosuvastatin (CRESTOR) 20 MG tablet Take 20 mg by mouth daily.    . Sodium Phosphates (ENEMA) 7-19 GM/118ML ENEM Place rectally.    . tamsulosin (FLOMAX) 0.4 MG CAPS capsule Take 1 capsule by mouth once.    . TRELEGY ELLIPTA 100-62.5-25 MCG/INH AEPB Inhale 1 puff into the lungs daily.    Marland Kitchen triamcinolone (KENALOG) 0.025 % ointment Apply 1 application 2 (two) times daily topically. 30 g 1   No current facility-administered medications for this visit.    Facility-Administered Medications Ordered in Other Visits  Medication Dose Route Frequency Provider Last Rate Last Dose  . sodium chloride flush (NS) 0.9 % injection 10 mL  10 mL Intracatheter PRN Curt Bears, MD   10 mL at 10/31/17 1230    SURGICAL HISTORY:  Past Surgical History:  Procedure Laterality Date  . CARDIAC CATHETERIZATION    . COLONOSCOPY W/ POLYPECTOMY    . CORONARY ARTERY BYPASS GRAFT     Dr [facility] 07/2007  . CORONARY ARTERY BYPASS GRAFT     1st one was @ [facility] 26years ago 12/1991  . CRANIECTOMY FOR EXCISION OF BRAIN TUMOR INFRATENTORIAL / POSTERIOR FOSSA  08/05/2016   Dr Rebecka Apley  @ [facility]/Neurosurgery  . FLEXIBLE BRONCHOSCOPY  08/07/2016   Dr Gwenevere Ghazi  . IR FLUORO GUIDE PORT INSERTION RIGHT  11/18/2016  . IR THORACENTESIS ASP PLEURAL SPACE W/IMG GUIDE  03/09/2017  . IR US GUIDE VASC ACCESS RIGHT  11/18/2016  . LUNG BIOPSY N/A 10/14/2016   Procedure: LUNG BIOPSY;  Surgeon: Grace Isaac, MD;  Location: [facility];   Service: Thoracic;  Laterality: N/A;  . VIDEO BRONCHOSCOPY WITH ENDOBRONCHIAL ULTRASOUND N/A 10/14/2016   Procedure: VIDEO BRONCHOSCOPY WITH ENDOBRONCHIAL ULTRASOUND;  Surgeon: Grace Isaac, MD;  Location: [facility];  Service: Thoracic;  Laterality: N/A;    REVIEW OF SYSTEMS:   Review of Systems  Constitutional: Negative for fevers and chills, positive for fatigue, decreased appetite, and weight loss. HENT:   Negative for mouth sores, nosebleeds, sore throat.   Eyes: Negative for eye problems and icterus.  Respiratory: Negative for hemoptysis and wheezing.  Positive for cough and shortness of breath with exertion.  He wears home oxygen. Cardiovascular: Negative for chest pain.  Gastrointestinal: Negative for abdominal pain, constipation, diarrhea, nausea and vomiting.  Genitourinary: Negative for bladder incontinence, difficulty urinating, dysuria, frequency and hematuria.   Musculoskeletal: Negative for back pain, neck pain and neck stiffness.  Skin: Negative for itching and rash.  Neurological: Negative for dizziness, extremity weakness, headaches, light-headedness and seizures.  Hematological: Negative for adenopathy. Does not bruise/bleed easily.  Psychiatric/Behavioral: Negative for confusion, depression and sleep disturbance. The patient is not nervous/anxious.     PHYSICAL EXAMINATION:  Blood pressure 118/60, pulse 84, temperature 98.7 F (37.1 C), temperature source Oral, resp. rate 14, height _0  (1.803 m), weight 150 lb 3.2 oz (68.1 kg), SpO2 96 %.  ECOG PERFORMANCE STATUS: 1 - Symptomatic but completely ambulatory  Physical Exam  Constitutional: Oriented to person, place, and time. No distress.  HENT:  Head: Normocephalic and atraumatic.  Mouth/Throat: Oropharynx is clear and moist. No oropharyngeal exudate.  Eyes: Conjunctivae are normal. Right eye exhibits no discharge. Left eye exhibits no discharge. No scleral icterus.  Neck: Normal range of motion. Neck supple.   Cardiovascular: Normal rate, regular rhythm, normal heart sounds and intact distal pulses.   Pulmonary/Chest: Effort normal. No respiratory distress. No wheezes. No rales.  Diminished breath sounds left base. Abdominal: Soft. Bowel sounds are normal. Exhibits no distension and no mass. There is no tenderness.  Musculoskeletal: Normal range of motion. Exhibits no edema.  Lymphadenopathy:    No cervical adenopathy.  Neurological: Alert and oriented to person, place, and time. Exhibits normal muscle tone. Coordination normal.  Skin: Skin is warm and dry. No rash noted. Not diaphoretic. No erythema. No pallor.  Psychiatric: Mood, memory and judgment normal.  Vitals reviewed.  LABORATORY DATA: Lab Results  Component Value Date   WBC 5.7 10/31/2017   HGB 10.8 (L) 10/31/2017   HCT 32.5 (L) 10/31/2017   MCV 95.1 10/31/2017   PLT 225 10/31/2017      Chemistry      Component Value Date/Time   NA 134 (L) 10/31/2017 0853   NA 138 02/08/2017 1118   K 4.3 10/31/2017 0853   K 4.5 02/08/2017 1118   CL 97 (L) 10/31/2017 0853   CO2 29 10/31/2017 0853   CO2 24 02/08/2017 1118   BUN 24 (H) 10/31/2017 0853   BUN 16.4 02/08/2017 1118   CREATININE 0.82 10/31/2017 0853   CREATININE 0.9 02/08/2017 1118      Component Value Date/Time   CALCIUM 8.5 (L) 10/31/2017 0853   CALCIUM 9.0 02/08/2017 1118   ALKPHOS 50 10/31/2017 0853   ALKPHOS 72 02/08/2017 1118   AST 61 (H) 10/31/2017 0853   AST 19 02/08/2017 1118   ALT 58 (H) 10/31/2017 0853   ALT 15 02/08/2017 1118   BILITOT 0.3 10/31/2017 0853   BILITOT 0.53 02/08/2017 1118       RADIOGRAPHIC STUDIES:  No results found.   ASSESSMENT/PLAN:  Stage IV squamous cell carcinoma of left lung ([place]) This is a very pleasant 84 year old white male with metastatic non-small cell lung cancer, squamous cell carcinoma with negative PDL 1 expression and enlarging solitary brain metastasis.  The patient completed palliative radiotherapy to the brain  metastasis. He completed systemic chemotherapy with carboplatin, paclitaxel and Keytruda status post 4 cycles.  He has no evidence for disease progression with this treatment. The patient is currently undergoing treatment with single agent Keytruda status post 11 cycles.   He has been tolerating this treatment well. He has been off treatment for several weeks now because of recent  hospitalization for shortness of breath and recurrent pleural effusions.  His breathing has improved somewhat.  The patient was seen with Dr. Julien Nordmann.  We discussed that he should proceed with cycle #12 of Keytruda today as scheduled.  We will obtain a restaging CT scan of the chest, abdomen, pelvis prior to his next visit. After review of the CT scan, will consider referral to cardiothoracic surgery for consideration of talc pleurodesis and possible Pleurx placement on the right side if he has significant pleural effusion on his CT scan. The patient was advised to call us if his breathing worsens and we can consider seeing him in our symptom management clinic and getting him set up for thoracentesis in the interim. He will follow-up in 3 weeks for evaluation prior to cycle #13 of his treatment and to review his restaging CT scan results.  He was advised to call immediately if he has any concerning symptoms in the interval. The patient voices understanding of current disease status and treatment options and is in agreement with the current care plan. All questions were answered. The patient knows to call the clinic with any problems, questions or concerns. We can certainly see the patient much sooner if necessary.   Orders Placed This Encounter  Procedures  . CT ABDOMEN PELVIS W CONTRAST    Standing Status:   Future    Standing Expiration Date:   11/01/2018    Order Specific Question:   If indicated for the ordered procedure, I authorize the administration of contrast media per Radiology protocol    Answer:   Yes     Order Specific Question:   Preferred imaging location?    Answer:   [facility]    Order Specific Question:   Radiology Contrast Protocol - do NOT remove file path    Answer:   \\[email or web address]    Order Specific Question:   ** REASON FOR EXAM (FREE TEXT)    Answer:   Lung cancer. Restaging.  . CT CHEST W CONTRAST    Standing Status:   Future    Standing Expiration Date:   11/01/2018    Order Specific Question:   If indicated for the ordered procedure, I authorize the administration of contrast media per Radiology protocol    Answer:   Yes    Order Specific Question:   Preferred imaging location?    Answer:   [facility]    Order Specific Question:   Radiology Contrast Protocol - do NOT remove file path    Answer:   [email or web address]    Order Specific Question:   ** REASON FOR EXAM (FREE TEXT)    Answer:   Lung cancer. Restaging.     Mikey Bussing, DNP, AGPCNP-BC, AOCNP 10/31/17   ADDENDUM: Hematology/Oncology Attending: I had a face-to-face encounter with the patient today.  I recommended his care plan.  This is a very pleasant 84 years old white male with metastatic non-small cell lung cancer, squamous cell carcinoma status post induction systemic chemotherapy with carboplatin, paclitaxel and Keytruda and he is currently on maintenance treatment with single agent Ketruda (pembrolizumab) status post 11 cycles.  The patient has been tolerating this treatment well.  Unfortunately he was admitted to [facility] hospital few times recently with suspicious pneumonia as well as bilateral pleural effusion.  He had Pleurx catheter placed in the left hemithorax for drainage of the pleural effusion which drains between 300 to 500 mL on daily basis.  He also has moderate pleural  effusion on the right side. I recommended for the patient to continue the drainage of the left pleural effusion as scheduled. He will proceed  with cycle #12 of his immunotherapy today. We will consider The patient for drainage of the right pleural effusion if persisted. I will arrange for the patient to have repeat CT scan of the chest, abdomen and pelvis in 3 weeks before his upcoming visit with me before starting cycle #13. The patient was advised to call immediately if he has any concerning symptoms in the interval.  Disclaimer: This note was dictated with voice recognition software. Similar sounding words can inadvertently be transcribed and may be missed upon review. Eilleen Kempf, MD 10/31/17

## 2017-10-31 ENCOUNTER — Other Ambulatory Visit: Payer: Self-pay

## 2017-10-31 ENCOUNTER — Encounter: Payer: Self-pay | Admitting: Oncology

## 2017-10-31 ENCOUNTER — Telehealth: Payer: Self-pay | Admitting: Oncology

## 2017-10-31 ENCOUNTER — Inpatient Hospital Stay: Payer: Medicare Other

## 2017-10-31 ENCOUNTER — Inpatient Hospital Stay (HOSPITAL_BASED_OUTPATIENT_CLINIC_OR_DEPARTMENT_OTHER): Payer: Medicare Other | Admitting: Oncology

## 2017-10-31 VITALS — BP 118/60 | HR 84 | Temp 98.7°F | Resp 14 | Ht 71.0 in | Wt 150.2 lb

## 2017-10-31 DIAGNOSIS — C3492 Malignant neoplasm of unspecified part of left bronchus or lung: Secondary | ICD-10-CM

## 2017-10-31 DIAGNOSIS — Z95828 Presence of other vascular implants and grafts: Secondary | ICD-10-CM

## 2017-10-31 DIAGNOSIS — Z5112 Encounter for antineoplastic immunotherapy: Secondary | ICD-10-CM

## 2017-10-31 DIAGNOSIS — Z79899 Other long term (current) drug therapy: Secondary | ICD-10-CM | POA: Diagnosis not present

## 2017-10-31 DIAGNOSIS — C7931 Secondary malignant neoplasm of brain: Secondary | ICD-10-CM | POA: Diagnosis not present

## 2017-10-31 DIAGNOSIS — I10 Essential (primary) hypertension: Secondary | ICD-10-CM

## 2017-10-31 DIAGNOSIS — J90 Pleural effusion, not elsewhere classified: Secondary | ICD-10-CM

## 2017-10-31 DIAGNOSIS — C3412 Malignant neoplasm of upper lobe, left bronchus or lung: Secondary | ICD-10-CM | POA: Diagnosis not present

## 2017-10-31 LAB — CMP (CANCER CENTER ONLY)
ALBUMIN: 1.8 g/dL — AB (ref 3.5–5.0)
ALT: 58 U/L — ABNORMAL HIGH (ref 0–44)
AST: 61 U/L — AB (ref 15–41)
Alkaline Phosphatase: 50 U/L (ref 38–126)
Anion gap: 8 (ref 5–15)
BILIRUBIN TOTAL: 0.3 mg/dL (ref 0.3–1.2)
BUN: 24 mg/dL — AB (ref 8–23)
CALCIUM: 8.5 mg/dL — AB (ref 8.9–10.3)
CO2: 29 mmol/L (ref 22–32)
Chloride: 97 mmol/L — ABNORMAL LOW (ref 98–111)
Creatinine: 0.82 mg/dL (ref 0.61–1.24)
GFR, Est AFR Am: >60 mL/min (ref >60)
GLUCOSE: 275 mg/dL — AB (ref 70–99)
Potassium: 4.3 mmol/L (ref 3.5–5.1)
Sodium: 134 mmol/L — ABNORMAL LOW (ref 135–145)
TOTAL PROTEIN: 5.9 g/dL — AB (ref 6.5–8.1)

## 2017-10-31 LAB — CBC WITH DIFFERENTIAL (CANCER CENTER ONLY)
BASOS PCT: 1 %
Basophils Absolute: 0 10*3/uL (ref 0.0–0.1)
Eosinophils Absolute: 0.2 10*3/uL (ref 0.0–0.5)
Eosinophils Relative: 4 %
HCT: 32.5 % — ABNORMAL LOW (ref 38.4–49.9)
HEMOGLOBIN: 10.8 g/dL — AB (ref 13.0–17.1)
LYMPHS PCT: 3 %
Lymphs Abs: 0.2 10*3/uL — ABNORMAL LOW (ref 0.9–3.3)
MCH: 31.6 pg (ref 27.2–33.4)
MCHC: 33.2 g/dL (ref 32.0–36.0)
MCV: 95.1 fL (ref 79.3–98.0)
MONOS PCT: 11 %
Monocytes Absolute: 0.6 10*3/uL (ref 0.1–0.9)
NEUTROS PCT: 81 %
Neutro Abs: 4.6 10*3/uL (ref 1.5–6.5)
Platelet Count: 225 10*3/uL (ref 140–400)
RBC: 3.41 MIL/uL — ABNORMAL LOW (ref 4.20–5.82)
RDW: 14.7 % — ABNORMAL HIGH (ref 11.0–14.6)
WBC Count: 5.7 10*3/uL (ref 4.0–10.3)

## 2017-10-31 MED ORDER — SODIUM CHLORIDE 0.9 % IV SOLN
200.0000 mg | Freq: Once | INTRAVENOUS | Status: AC
  Administered 2017-10-31: 200 mg via INTRAVENOUS
  Filled 2017-10-31: qty 8

## 2017-10-31 MED ORDER — SODIUM CHLORIDE 0.9 % IV SOLN
Freq: Once | INTRAVENOUS | Status: AC
  Administered 2017-10-31: 11:00:00 via INTRAVENOUS
  Filled 2017-10-31: qty 250

## 2017-10-31 MED ORDER — SODIUM CHLORIDE 0.9% FLUSH
10.0000 mL | Freq: Once | INTRAVENOUS | Status: AC
  Administered 2017-10-31: 10 mL
  Filled 2017-10-31: qty 10

## 2017-10-31 MED ORDER — SODIUM CHLORIDE 0.9% FLUSH
10.0000 mL | INTRAVENOUS | Status: DC | PRN
  Administered 2017-10-31: 10 mL
  Filled 2017-10-31: qty 10

## 2017-10-31 MED ORDER — HEPARIN SOD (PORK) LOCK FLUSH 100 UNIT/ML IV SOLN
500.0000 [IU] | Freq: Once | INTRAVENOUS | Status: AC | PRN
  Administered 2017-10-31: 500 [IU]
  Filled 2017-10-31: qty 5

## 2017-10-31 NOTE — Telephone Encounter (Signed)
Scheduled appt per 9/24 sch message - pt to get an updated schedule next visit.

## 2017-10-31 NOTE — Progress Notes (Signed)
Chemo Infusion: ok to treat with liver enzymes per Myrtha Mantis, NP 10/31/2017

## 2017-10-31 NOTE — Patient Instructions (Signed)
[facility] Discharge Instructions for Patients Receiving Chemotherapy  Today you received the following chemotherapy agents:  Keytruda.  To help prevent nausea and vomiting after your treatment, we encourage you to take your nausea medication as directed.   If you develop nausea and vomiting that is not controlled by your nausea medication, call the clinic.   BELOW ARE SYMPTOMS THAT SHOULD BE REPORTED IMMEDIATELY:  *FEVER GREATER THAN 100.5 F  *CHILLS WITH OR WITHOUT FEVER  NAUSEA AND VOMITING THAT IS NOT CONTROLLED WITH YOUR NAUSEA MEDICATION  *UNUSUAL SHORTNESS OF BREATH  *UNUSUAL BRUISING OR BLEEDING  TENDERNESS IN MOUTH AND THROAT WITH OR WITHOUT PRESENCE OF ULCERS  *URINARY PROBLEMS  *BOWEL PROBLEMS  UNUSUAL RASH Items with * indicate a potential emergency and should be followed up as soon as possible.  Feel free to call the clinic should you have any questions or concerns. The clinic phone number is [phone].  Please show the CHEMO ALERT CARD at check-in to the Emergency Department and triage nurse.    

## 2017-11-03 ENCOUNTER — Telehealth: Payer: Self-pay | Admitting: *Deleted

## 2017-11-03 NOTE — Telephone Encounter (Signed)
Reached out to pt to discuss pt concerns. WIfe states "We have decided to stay with Dr. Keturah Shavers and Dr. Julien Nordmann. We did not feel comfortable with Dr. Langston Masker and we have a relationship with Dr.Pullock and he has known my husband for years" Expressed to wife I will left MD know. No further concerns.

## 2017-11-10 ENCOUNTER — Encounter: Payer: Self-pay | Admitting: Urology

## 2017-11-10 ENCOUNTER — Ambulatory Visit
Admission: RE | Admit: 2017-11-10 | Discharge: 2017-11-10 | Disposition: A | Discharge status: Home / Self Care | Payer: Medicare Other | Source: Ambulatory Visit | Attending: Urology | Admitting: Urology

## 2017-11-10 ENCOUNTER — Other Ambulatory Visit: Payer: Self-pay

## 2017-11-10 VITALS — BP 107/59 | HR 93 | Temp 97.5°F | Resp 20 | Ht 71.0 in | Wt 133.2 lb

## 2017-11-10 DIAGNOSIS — Z79899 Other long term (current) drug therapy: Secondary | ICD-10-CM | POA: Diagnosis not present

## 2017-11-10 DIAGNOSIS — Z881 Allergy status to other antibiotic agents status: Secondary | ICD-10-CM | POA: Diagnosis not present

## 2017-11-10 DIAGNOSIS — C7931 Secondary malignant neoplasm of brain: Secondary | ICD-10-CM | POA: Diagnosis not present

## 2017-11-10 DIAGNOSIS — Z794 Long term (current) use of insulin: Secondary | ICD-10-CM | POA: Insufficient documentation

## 2017-11-10 NOTE — Progress Notes (Signed)
Radiation Oncology         [phone] ________________________________  Name: Steve Frank MRN: [id number]  Date: 11/10/2017  DOB: 05/10/1931  Post Treatment Note  CC: [name], MD  Grace Isaac, MD  Diagnosis:   84 y.o. male with 9 new brain metastases   Interval Since Last Radiation:  2 months 09/08/2017:  Brain PTV2-10 // 20 Gy in 1 fraction, max dose= 136.4%  PTV2: Right Parietal 39mm / 20Gy  PTV3: Right Temporal 69mm / 20 Gy  PTV4: Right Temporal 50mm / 20 Gy  PTV5: Left Frontal 61mm / 20 Gy  PTV6: Left Frontal 52mm / 20 Gy  PTV7: Left Occipital 60mm / 20 Gy  PTV8: Right Frontal 61mm / 20 Gy  PTV9: Left Frontal 24mm / 20 Gy  PTV10: Left Frontal 50mm / 20 Gy  11/17/16- Leftcerebellartarget was treated to a prescription dose of 15Gy.  11/21/2016 - 01/10/2017- The primary tumor in the LLL and involved mediastinal adenopathy were treated to 66 Gy in 33 fractions of 2 Gy.  Narrative:  The patient returns today for routine follow-up.  He tolerated radiation treatment well. There were no signs of acute toxicity after treatment.                              On review of systems, the patient states that he continues to make gradual progress in recovery since his recent hospitalization due to recurrent pleural effusions requiring a Pleurx catheter be placed on the left.  He reports that this continues to drain 250 mL/day which is significantly improved from 500 mL/day approximately 1 month ago.  He remains mildly short of breath but reports significantly improved breathing.  He denies cough, hemoptysis, fever, chills or night sweats.  He reports that he tolerated his recent [facility] treatment very well and has had no ill side effects.  He specifically denies any recent visual or auditory changes, dizziness/imbalance, headaches, difficulty with speech, tinnitus, focal weakness or seizure activity.   He has continued on maintenance treatment with single agent Keytruda every 3 weeks  -started March 14, 2017 and currently status post 12cycles.  He will have repeat systemic imaging in approximately 3 weeks prior to his follow-up visit with Dr. Earlie Server.  ALLERGIES:  is allergic to ciprofloxacin.  Meds: Current Outpatient Medications  Medication Sig Dispense Refill  . acetaminophen (TYLENOL) 325 MG tablet Take 650 mg by mouth every 6 (six) hours as needed (for pain/headaches.).     Marland Kitchen albuterol (PROVENTIL HFA;VENTOLIN HFA) 108 (90 Base) MCG/ACT inhaler Inhale 2 puffs into the lungs every 6 (six) hours as needed (for wheezing/shortness of breath).     . B-D INS SYR ULTRAFINE 1CC/31G 31G X 5/16" 1 ML MISC     . Cholecalciferol (VITAMIN D-1000 MAX ST) 1000 units tablet Take 1,000 Units by mouth daily.     Marland Kitchen docusate sodium (COLACE) 100 MG capsule Take 100 mg by mouth 2 (two) times daily as needed (for constipation.).    Marland Kitchen gabapentin (NEURONTIN) 400 MG capsule Take 400 mg by mouth at bedtime.    Marland Kitchen guaiFENesin-dextromethorphan (ROBITUSSIN DM) 100-10 MG/5ML syrup Take 15 mLs every 4 (four) hours as needed by mouth for cough.    Marland Kitchen HUMALOG 100 UNIT/ML injection INJECT 2 TO 0.12MLS (2-8 UNITS) UNDER THE SKIN 3 TIMES A DAY BEFORE MEALS, AS PER SLIDING SCALE  0  . Lancets (FREESTYLE) lancets     .  LANTUS 100 UNIT/ML injection INJECT 0.15ML (15 UNITS TOTAL) UNDER THE SKIN DAILY AT BEDTIME.  0  . lidocaine-prilocaine (EMLA) cream Apply 1 application topically as needed. 30 g 0  . mirtazapine (REMERON) 15 MG tablet Take 15 mg by mouth at bedtime.  0  . Multiple Vitamin (MULTIVITAMIN WITH MINERALS) TABS tablet Take 1 tablet by mouth daily.    Marland Kitchen omeprazole (PRILOSEC) 20 MG capsule Take 20 mg by mouth at bedtime.     . rosuvastatin (CRESTOR) 20 MG tablet Take 20 mg by mouth daily.    . tamsulosin (FLOMAX) 0.4 MG CAPS capsule Take 1 capsule by mouth once.    . bisacodyl (DULCOLAX) 10 MG suppository Place rectally.    . clotrimazole-betamethasone (LOTRISONE) cream Apply 1 application  topically 2 (two) times daily.    . furosemide (LASIX) 20 MG tablet Take by mouth.    . magnesium hydroxide (MILK OF MAGNESIA) 400 MG/5ML suspension Take 30 mLs by mouth daily as needed (for constipation.).    Marland Kitchen neomycin-bacitracin-polymyxin (NEOSPORIN) [phone] ointment Apply 1 application topically at bedtime.    . polyethylene glycol (MIRALAX / GLYCOLAX) packet Take 17 g by mouth daily as needed (for constipation.).     Marland Kitchen prochlorperazine (COMPAZINE) 10 MG tablet Take 1 tablet (10 mg total) by mouth every 6 (six) hours as needed for nausea or vomiting. (Patient not taking: Reported on 11/10/2017) 30 tablet 0  . Sodium Phosphates (ENEMA) 7-19 GM/118ML ENEM Place rectally.    . TRELEGY ELLIPTA 100-62.5-25 MCG/INH AEPB Inhale 1 puff into the lungs daily.    Marland Kitchen triamcinolone (KENALOG) 0.025 % ointment Apply 1 application 2 (two) times daily topically. (Patient not taking: Reported on 11/10/2017) 30 g 1   No current facility-administered medications for this encounter.     Physical Findings:  height is 5\' 11"  (1.803 m) and weight is 133 lb 3.2 oz (60.4 kg). His oral temperature is 97.5 F (36.4 C) (abnormal). His blood pressure is 107/59 (abnormal) and his pulse is 93. His respiration is 20 and oxygen saturation is 97%.  Pain Assessment Pain Score: 0-No pain/10 In general this is a well appearing Caucasian male in no acute distress.  He's alert and oriented x4 and appropriate throughout the examination. Cardiopulmonary assessment is negative for acute distress and he exhibits normal effort.  The lungs are clear to auscultation bilaterally. EOMs intact bilaterally and PERRLA.  No gross neurologic deficits noted.  Lab Findings: Lab Results  Component Value Date   WBC 5.7 10/31/2017   HGB 10.8 (L) 10/31/2017   HCT 32.5 (L) 10/31/2017   MCV 95.1 10/31/2017   PLT 225 10/31/2017     Radiographic Findings: No results found.  Impression/Plan: 1. 84 y.o. male with 9 new brain metastases. He  appears to have recovered well from the effects of his recent [facility] treatment.  We will plan to repeat an MRI brain in approximately 1 month to assess his treatment response and monitor for any disease recurrence and/or progression.  His case and imaging will be reviewed at the upcoming multidisciplinary brain conference prior to a follow-up office visit to review results and any further recommendations.  He appears to have a good understanding of his overall disease and our recommendations and is in agreement with the stated plan.  He knows to call at anytime with any questions or concerns in the interim.    Nicholos Johns, PA-C

## 2017-11-20 ENCOUNTER — Telehealth: Payer: Self-pay | Admitting: *Deleted

## 2017-11-20 NOTE — Telephone Encounter (Signed)
TCT patient this morning after receiving report of his wife calling over the weekend to on call service about supplies for pleuryx drain. Spoke with patient. He states that they have run out of supplies to drain his pleuryx tube.  He states his wife last drained it Saturday for 150cc. Pt states he feels ok, denies change in breathing or overall comfort status. Advised pt that I would his home health agency [facility] [phone]) to inquire about the supplies and when to expect them. TCT [facility], spoke with nurse manager, [place]. Informed her of the situation and of the ptaitent and his wife's concern about getting the pleuryx drain kits ASAP.  [place] states she and her manager discussed the situation this morning and apparently the supplies were ordered over the weekend but she didn't know when they would arrive.  Explained to her the anxiety they ptaient and his wife had about this and that the supplies need to be delivered today.  Pt gets fluid drained every other day. He has an appt here tomorrow starting at 8:30 am, including labs, MD visit and treatment and they would not be home until after 2pm.  Also pleuryx draining procedure is not done in this clinic. [place] states she will facilitate obtaining the needed supplies and also call the wife about the progress being made . Call made to Mr. Shirer and made him aware to expect a call from [facility] today.

## 2017-11-21 ENCOUNTER — Inpatient Hospital Stay (HOSPITAL_BASED_OUTPATIENT_CLINIC_OR_DEPARTMENT_OTHER): Payer: Medicare Other | Admitting: Internal Medicine

## 2017-11-21 ENCOUNTER — Inpatient Hospital Stay: Payer: Medicare Other | Attending: Internal Medicine

## 2017-11-21 ENCOUNTER — Inpatient Hospital Stay: Payer: Medicare Other

## 2017-11-21 ENCOUNTER — Encounter: Payer: Self-pay | Admitting: Internal Medicine

## 2017-11-21 ENCOUNTER — Telehealth: Payer: Self-pay | Admitting: Internal Medicine

## 2017-11-21 VITALS — BP 116/66 | HR 86 | Temp 97.7°F | Resp 20 | Ht 71.0 in | Wt 147.1 lb

## 2017-11-21 DIAGNOSIS — C3492 Malignant neoplasm of unspecified part of left bronchus or lung: Secondary | ICD-10-CM

## 2017-11-21 DIAGNOSIS — C3412 Malignant neoplasm of upper lobe, left bronchus or lung: Secondary | ICD-10-CM

## 2017-11-21 DIAGNOSIS — J90 Pleural effusion, not elsewhere classified: Secondary | ICD-10-CM

## 2017-11-21 DIAGNOSIS — Z5112 Encounter for antineoplastic immunotherapy: Secondary | ICD-10-CM | POA: Insufficient documentation

## 2017-11-21 DIAGNOSIS — I10 Essential (primary) hypertension: Secondary | ICD-10-CM | POA: Insufficient documentation

## 2017-11-21 DIAGNOSIS — E119 Type 2 diabetes mellitus without complications: Secondary | ICD-10-CM | POA: Insufficient documentation

## 2017-11-21 DIAGNOSIS — Z95828 Presence of other vascular implants and grafts: Secondary | ICD-10-CM

## 2017-11-21 DIAGNOSIS — Z79899 Other long term (current) drug therapy: Secondary | ICD-10-CM | POA: Diagnosis not present

## 2017-11-21 DIAGNOSIS — C7931 Secondary malignant neoplasm of brain: Secondary | ICD-10-CM | POA: Insufficient documentation

## 2017-11-21 DIAGNOSIS — I2510 Atherosclerotic heart disease of native coronary artery without angina pectoris: Secondary | ICD-10-CM | POA: Insufficient documentation

## 2017-11-21 DIAGNOSIS — C778 Secondary and unspecified malignant neoplasm of lymph nodes of multiple regions: Secondary | ICD-10-CM

## 2017-11-21 LAB — CBC WITH DIFFERENTIAL (CANCER CENTER ONLY)
ABS IMMATURE GRANULOCYTES: 0.03 10*3/uL (ref 0.00–0.07)
BASOS ABS: 0.1 10*3/uL (ref 0.0–0.1)
BASOS PCT: 1 %
Eosinophils Absolute: 0.5 10*3/uL (ref 0.0–0.5)
Eosinophils Relative: 6 %
HCT: 33.5 % — ABNORMAL LOW (ref 39.0–52.0)
Hemoglobin: 10.6 g/dL — ABNORMAL LOW (ref 13.0–17.0)
Immature Granulocytes: 0 %
Lymphocytes Relative: 5 %
Lymphs Abs: 0.4 10*3/uL — ABNORMAL LOW (ref 0.7–4.0)
MCH: 30.5 pg (ref 26.0–34.0)
MCHC: 31.6 g/dL (ref 30.0–36.0)
MCV: 96.5 fL (ref 80.0–100.0)
MONO ABS: 0.7 10*3/uL (ref 0.1–1.0)
Monocytes Relative: 8 %
NEUTROS ABS: 6.8 10*3/uL (ref 1.7–7.7)
NEUTROS PCT: 80 %
PLATELETS: 193 10*3/uL (ref 150–400)
RBC: 3.47 MIL/uL — AB (ref 4.22–5.81)
RDW: 15.2 % (ref 11.5–15.5)
WBC: 8.5 10*3/uL (ref 4.0–10.5)
nRBC: 0 % (ref 0.0–0.2)

## 2017-11-21 LAB — CMP (CANCER CENTER ONLY)
ALT: 18 U/L (ref 0–44)
ANION GAP: 7 (ref 5–15)
AST: 25 U/L (ref 15–41)
Albumin: 2 g/dL — ABNORMAL LOW (ref 3.5–5.0)
Alkaline Phosphatase: 59 U/L (ref 38–126)
BILIRUBIN TOTAL: 0.3 mg/dL (ref 0.3–1.2)
BUN: 19 mg/dL (ref 8–23)
CHLORIDE: 103 mmol/L (ref 98–111)
CO2: 28 mmol/L (ref 22–32)
Calcium: 8.8 mg/dL — ABNORMAL LOW (ref 8.9–10.3)
Creatinine: 0.72 mg/dL (ref 0.61–1.24)
GFR, Est AFR Am: >60 mL/min (ref >60)
Glucose, Bld: 190 mg/dL — ABNORMAL HIGH (ref 70–99)
POTASSIUM: 4.3 mmol/L (ref 3.5–5.1)
Sodium: 138 mmol/L (ref 135–145)
TOTAL PROTEIN: 6 g/dL — AB (ref 6.5–8.1)

## 2017-11-21 MED ORDER — SODIUM CHLORIDE 0.9 % IV SOLN
Freq: Once | INTRAVENOUS | Status: AC
  Administered 2017-11-21: 11:00:00 via INTRAVENOUS
  Filled 2017-11-21: qty 250

## 2017-11-21 MED ORDER — SODIUM CHLORIDE 0.9% FLUSH
10.0000 mL | Freq: Once | INTRAVENOUS | Status: AC
  Administered 2017-11-21: 10 mL
  Filled 2017-11-21: qty 10

## 2017-11-21 MED ORDER — SODIUM CHLORIDE 0.9 % IV SOLN
200.0000 mg | Freq: Once | INTRAVENOUS | Status: AC
  Administered 2017-11-21: 200 mg via INTRAVENOUS
  Filled 2017-11-21: qty 8

## 2017-11-21 MED ORDER — HEPARIN SOD (PORK) LOCK FLUSH 100 UNIT/ML IV SOLN
500.0000 [IU] | Freq: Once | INTRAVENOUS | Status: AC | PRN
  Administered 2017-11-21: 500 [IU]
  Filled 2017-11-21: qty 5

## 2017-11-21 MED ORDER — SODIUM CHLORIDE 0.9% FLUSH
10.0000 mL | INTRAVENOUS | Status: DC | PRN
  Administered 2017-11-21: 10 mL
  Filled 2017-11-21: qty 10

## 2017-11-21 NOTE — Progress Notes (Signed)
[place] Telephone:([phone]   [phone]) [phone]  OFFICE PROGRESS NOTE  [name], MD [address] [place] [address]  DIAGNOSIS: Stage IV (T1b, N3, M1c) non-small cell lung cancer, squamous cell carcinoma diagnosed in September 2018 presented with left infrahilar mass in addition to mediastinal and supraclavicular lymphadenopathy as well as metastatic brain lesion.  PDL1 expression 0%.  PRIOR THERAPY:  1) Palliative radiotherapy to the brain metastasis. 2)  Systemic chemotherapy with carboplatin for AUC of 5, paclitaxel 175 MG/M2 and Ketruda (pembrolizumab) 200 MG IV every 3 weeks. First dose 11/21/2016.  Status post 4 cycles.  CURRENT THERAPY: Maintenance treatment with single agent Keytruda 200 mg IV every 3 weeks.  First cycle March 14, 2017.  Status post 12 cycles.  INTERVAL HISTORY: Steve Frank 84 y.o. male returns to the clinic today for follow-up visit accompanied by his wife.  The patient is feeling fine today with no specific complaints except for fatigue and shortness of breath at baseline increased with exertion.  He is currently on home oxygen 3 L/minute with oxygen saturation 100%.  He denied having any chest pain but has cough with no hemoptysis.  He has no recent weight loss or night sweats.  He has no nausea, vomiting, diarrhea or constipation.  He continues to tolerate his treatment with Keytruda fairly well.  He was supposed to have repeat CT scan of the chest, abdomen and pelvis before this visit but unfortunately it was not scheduled as ordered.  He is here for evaluation before starting cycle #13.  MEDICAL HISTORY: Past Medical History:  Diagnosis Date  . Arthritis   . BPH (benign prostatic hyperplasia)   . CAD (coronary artery disease)    s/p 2 cabg's  . Cancer (HCC)    Basal and squamous cell  skin cancer  . Cerebellar mass   . Chest mass   . Diabetes ([place])    Type II  . GERD (gastroesophageal reflux disease)     . Head injury with loss of consciousness [facility])    age 73 or 25-   . Heart attack ([place])   . HLD (hyperlipidemia)   . Hypertension   . Macular degeneration   . Pneumonia     x 2 last 1071  . Stage IV squamous cell carcinoma of left lung ([place]) 10/20/2016    ALLERGIES:  is allergic to ciprofloxacin.  MEDICATIONS:  Current Outpatient Medications  Medication Sig Dispense Refill  . acetaminophen (TYLENOL) 325 MG tablet Take 650 mg by mouth every 6 (six) hours as needed (for pain/headaches.).     Marland Kitchen albuterol (PROVENTIL HFA;VENTOLIN HFA) 108 (90 Base) MCG/ACT inhaler Inhale 2 puffs into the lungs every 6 (six) hours as needed (for wheezing/shortness of breath).     . B-D INS SYR ULTRAFINE 1CC/31G 31G X 5/16" 1 ML MISC     . bisacodyl (DULCOLAX) 10 MG suppository Place rectally.    . Cholecalciferol (VITAMIN D-1000 MAX ST) 1000 units tablet Take 1,000 Units by mouth daily.     . clotrimazole-betamethasone (LOTRISONE) cream Apply 1 application topically 2 (two) times daily.    Marland Kitchen docusate sodium (COLACE) 100 MG capsule Take 100 mg by mouth 2 (two) times daily as needed (for constipation.).    Marland Kitchen furosemide (LASIX) 20 MG tablet Take by mouth.    . gabapentin (NEURONTIN) 400 MG capsule Take 400 mg by mouth at bedtime.    Marland Kitchen guaiFENesin-dextromethorphan (ROBITUSSIN DM) 100-10 MG/5ML syrup Take  15 mLs every 4 (four) hours as needed by mouth for cough.    Marland Kitchen HUMALOG 100 UNIT/ML injection INJECT 2 TO 0.12MLS (2-8 UNITS) UNDER THE SKIN 3 TIMES A DAY BEFORE MEALS, AS PER SLIDING SCALE  0  . Lancets (FREESTYLE) lancets     . LANTUS 100 UNIT/ML injection INJECT 0.15ML (15 UNITS TOTAL) UNDER THE SKIN DAILY AT BEDTIME.  0  . lidocaine-prilocaine (EMLA) cream Apply 1 application topically as needed. 30 g 0  . magnesium hydroxide (MILK OF MAGNESIA) 400 MG/5ML suspension Take 30 mLs by mouth daily as needed (for constipation.).    Marland Kitchen mirtazapine (REMERON) 15 MG tablet Take 15 mg by mouth at bedtime.  0  . Multiple  Vitamin (MULTIVITAMIN WITH MINERALS) TABS tablet Take 1 tablet by mouth daily.    Marland Kitchen neomycin-bacitracin-polymyxin (NEOSPORIN) [phone] ointment Apply 1 application topically at bedtime.    Marland Kitchen omeprazole (PRILOSEC) 20 MG capsule Take 20 mg by mouth at bedtime.     . polyethylene glycol (MIRALAX / GLYCOLAX) packet Take 17 g by mouth daily as needed (for constipation.).     Marland Kitchen prochlorperazine (COMPAZINE) 10 MG tablet Take 1 tablet (10 mg total) by mouth every 6 (six) hours as needed for nausea or vomiting. (Patient not taking: Reported on 11/10/2017) 30 tablet 0  . rosuvastatin (CRESTOR) 20 MG tablet Take 20 mg by mouth daily.    . Sodium Phosphates (ENEMA) 7-19 GM/118ML ENEM Place rectally.    . tamsulosin (FLOMAX) 0.4 MG CAPS capsule Take 1 capsule by mouth once.    . TRELEGY ELLIPTA 100-62.5-25 MCG/INH AEPB Inhale 1 puff into the lungs daily.    Marland Kitchen triamcinolone (KENALOG) 0.025 % ointment Apply 1 application 2 (two) times daily topically. (Patient not taking: Reported on 11/10/2017) 30 g 1   No current facility-administered medications for this visit.     SURGICAL HISTORY:  Past Surgical History:  Procedure Laterality Date  . CARDIAC CATHETERIZATION    . COLONOSCOPY W/ POLYPECTOMY    . CORONARY ARTERY BYPASS GRAFT     Dr Prescott Gum [facility] 07/2007  . CORONARY ARTERY BYPASS GRAFT     1st one was @ [facility] 26years ago 12/1991  . CRANIECTOMY FOR EXCISION OF BRAIN TUMOR INFRATENTORIAL / POSTERIOR FOSSA  08/05/2016   Dr Rebecka Apley  @ [facility]/Neurosurgery  . FLEXIBLE BRONCHOSCOPY  08/07/2016   Dr Gwenevere Ghazi  . IR FLUORO GUIDE PORT INSERTION RIGHT  11/18/2016  . IR THORACENTESIS ASP PLEURAL SPACE W/IMG GUIDE  03/09/2017  . IR US GUIDE VASC ACCESS RIGHT  11/18/2016  . LUNG BIOPSY N/A 10/14/2016   Procedure: LUNG BIOPSY;  Surgeon: Grace Isaac, MD;  Location: [place];  Service: Thoracic;  Laterality: N/A;  . VIDEO BRONCHOSCOPY WITH ENDOBRONCHIAL ULTRASOUND N/A 10/14/2016   Procedure: VIDEO  BRONCHOSCOPY WITH ENDOBRONCHIAL ULTRASOUND;  Surgeon: Grace Isaac, MD;  Location: [place];  Service: Thoracic;  Laterality: N/A;    REVIEW OF SYSTEMS:  A comprehensive review of systems was negative except for: Constitutional: positive for fatigue Respiratory: positive for cough and dyspnea on exertion   PHYSICAL EXAMINATION: General appearance: alert, cooperative, fatigued and no distress Head: Normocephalic, without obvious abnormality, atraumatic Neck: no adenopathy, no JVD, supple, symmetrical, trachea midline and thyroid not enlarged, symmetric, no tenderness/mass/nodules Lymph nodes: Cervical, supraclavicular, and axillary nodes normal. Resp: diminished breath sounds bilaterally and dullness to percussion bilaterally Back: symmetric, no curvature. ROM normal. No CVA tenderness. Cardio: regular rate and rhythm, S1, S2 normal, no murmur,  click, rub or gallop GI: soft, non-tender; bowel sounds normal; no masses,  no organomegaly Extremities: extremities normal, atraumatic, no cyanosis or edema  ECOG PERFORMANCE STATUS: 1 - Symptomatic but completely ambulatory  Blood pressure 116/66, pulse 86, temperature 97.7 F (36.5 C), temperature source Oral, resp. rate 20, height _0  (1.803 m), weight 147 lb 1.6 oz (66.7 kg), SpO2 100 %.  LABORATORY DATA: Lab Results  Component Value Date   WBC 8.5 11/21/2017   HGB 10.6 (L) 11/21/2017   HCT 33.5 (L) 11/21/2017   MCV 96.5 11/21/2017   PLT 193 11/21/2017      Chemistry      Component Value Date/Time   NA 138 11/21/2017 0834   NA 138 02/08/2017 1118   K 4.3 11/21/2017 0834   K 4.5 02/08/2017 1118   CL 103 11/21/2017 0834   CO2 28 11/21/2017 0834   CO2 24 02/08/2017 1118   BUN 19 11/21/2017 0834   BUN 16.4 02/08/2017 1118   CREATININE 0.72 11/21/2017 0834   CREATININE 0.9 02/08/2017 1118      Component Value Date/Time   CALCIUM 8.8 (L) 11/21/2017 0834   CALCIUM 9.0 02/08/2017 1118   ALKPHOS 59 11/21/2017 0834   ALKPHOS  72 02/08/2017 1118   AST 25 11/21/2017 0834   AST 19 02/08/2017 1118   ALT 18 11/21/2017 0834   ALT 15 02/08/2017 1118   BILITOT 0.3 11/21/2017 0834   BILITOT 0.53 02/08/2017 1118       RADIOGRAPHIC STUDIES: No results found.  ASSESSMENT AND PLAN: This is a very pleasant 84 years old white male with metastatic non-small cell lung cancer, squamous cell carcinoma with negative PDL 1 expression and enlarging solitary brain metastasis.  The patient completed palliative radiotherapy to the brain metastasis. He completed systemic chemotherapy with carboplatin, paclitaxel and Keytruda status post 4 cycles.  He has no evidence for disease progression with this treatment. The patient is currently undergoing treatment with single agent Keytruda status post 12 cycles.   The patient has been tolerating this treatment well with no concerning complaints. I recommended for him to proceed with cycle #13 today as scheduled. For the recurrent left pleural effusion, he will continue the drainage via the Pleurx catheter. I will see the patient back for follow-up visit in 3 weeks for evaluation after repeating CT scan of the chest, abdomen and pelvis for restaging of his disease. He was advised to call immediately if he has any concerning symptoms in the interval. The patient voices understanding of current disease status and treatment options and is in agreement with the current care plan. All questions were answered. The patient knows to call the clinic with any problems, questions or concerns. We can certainly see the patient much sooner if necessary.  Disclaimer: This note was dictated with voice recognition software. Similar sounding words can inadvertently be transcribed and may not be corrected upon review.

## 2017-11-21 NOTE — Telephone Encounter (Signed)
Scheduled appt per 10/15 los - called radiology and they are aware of order for CT - per Marianna Fuss , she will call patient to set up appt.

## 2017-11-21 NOTE — Patient Instructions (Addendum)
[facility] Discharge Instructions for Patients Receiving Chemotherapy  Today you received the following chemotherapy agents: Pembrolizumab [name]).   To help prevent nausea and vomiting after your treatment, we encourage you to take your nausea medication as directed.   If you develop nausea and vomiting that is not controlled by your nausea medication, call the clinic.   BELOW ARE SYMPTOMS THAT SHOULD BE REPORTED IMMEDIATELY:  *FEVER GREATER THAN 100.5 F  *CHILLS WITH OR WITHOUT FEVER  NAUSEA AND VOMITING THAT IS NOT CONTROLLED WITH YOUR NAUSEA MEDICATION  *UNUSUAL SHORTNESS OF BREATH  *UNUSUAL BRUISING OR BLEEDING  TENDERNESS IN MOUTH AND THROAT WITH OR WITHOUT PRESENCE OF ULCERS  *URINARY PROBLEMS  *BOWEL PROBLEMS  UNUSUAL RASH Items with * indicate a potential emergency and should be followed up as soon as possible.  Feel free to call the clinic should you have any questions or concerns. The clinic phone number is [phone].  Please show the [facility] at check-in to the Emergency Department and triage nurse.

## 2017-12-04 ENCOUNTER — Telehealth: Payer: Self-pay | Admitting: Medical Oncology

## 2017-12-04 NOTE — Telephone Encounter (Addendum)
Home health nurse visited pt today and  said VSS . Oxygen 96% on room air. Blood in drainage in bottle settled and it " looks like apple cider ". He is not in any distress , He is sore over CT area from his fall.  I called Opal and told her to monitor drainage for now and call back tomorrow with update.

## 2017-12-04 NOTE — Telephone Encounter (Addendum)
Pleurix drainage bloody-Friday am - drained of 350 bright red blood. Fell Thursday on side where chest tube and having pain at site.Sat-very  little drainage but bloody. Wife will call [facility] to come see pt today and I asked her to have the nurse call with report , VS, etc.

## 2017-12-05 ENCOUNTER — Telehealth: Payer: Self-pay | Admitting: Medical Oncology

## 2017-12-05 NOTE — Telephone Encounter (Signed)
Drained yesterday of 150 cc of bloody drainage. Per Julien Nordmann I instructed wife to monitor for now and to call for any worsening symptoms and increased drainage of 350 ml. Wife stated pt felt okay. Pleurix catheter inserted by provider at [facility].

## 2017-12-08 ENCOUNTER — Ambulatory Visit (HOSPITAL_COMMUNITY)
Admission: RE | Admit: 2017-12-08 | Discharge: 2017-12-08 | Disposition: A | Discharge status: Home / Self Care | Payer: Medicare Other | Source: Ambulatory Visit | Attending: Oncology | Admitting: Oncology

## 2017-12-08 DIAGNOSIS — J439 Emphysema, unspecified: Secondary | ICD-10-CM | POA: Insufficient documentation

## 2017-12-08 DIAGNOSIS — J90 Pleural effusion, not elsewhere classified: Secondary | ICD-10-CM | POA: Diagnosis not present

## 2017-12-08 DIAGNOSIS — I700 Atherosclerosis of aorta: Secondary | ICD-10-CM | POA: Insufficient documentation

## 2017-12-08 DIAGNOSIS — C3492 Malignant neoplasm of unspecified part of left bronchus or lung: Secondary | ICD-10-CM | POA: Diagnosis not present

## 2017-12-08 DIAGNOSIS — R918 Other nonspecific abnormal finding of lung field: Secondary | ICD-10-CM | POA: Diagnosis not present

## 2017-12-08 DIAGNOSIS — R590 Localized enlarged lymph nodes: Secondary | ICD-10-CM | POA: Insufficient documentation

## 2017-12-08 MED ORDER — IOHEXOL 300 MG/ML  SOLN
30.0000 mL | Freq: Once | INTRAMUSCULAR | Status: AC | PRN
  Administered 2017-12-08: 30 mL via ORAL

## 2017-12-08 MED ORDER — IOHEXOL 300 MG/ML  SOLN
100.0000 mL | Freq: Once | INTRAMUSCULAR | Status: AC | PRN
  Administered 2017-12-08: 100 mL via INTRAVENOUS

## 2017-12-08 MED ORDER — SODIUM CHLORIDE 0.9 % IJ SOLN
INTRAMUSCULAR | Status: AC
  Filled 2017-12-08: qty 50

## 2017-12-12 ENCOUNTER — Inpatient Hospital Stay (HOSPITAL_BASED_OUTPATIENT_CLINIC_OR_DEPARTMENT_OTHER): Payer: Medicare Other | Admitting: Internal Medicine

## 2017-12-12 ENCOUNTER — Inpatient Hospital Stay: Payer: Medicare Other

## 2017-12-12 ENCOUNTER — Telehealth: Payer: Self-pay

## 2017-12-12 ENCOUNTER — Encounter: Payer: Self-pay | Admitting: Internal Medicine

## 2017-12-12 ENCOUNTER — Inpatient Hospital Stay: Payer: Medicare Other | Attending: Internal Medicine

## 2017-12-12 VITALS — BP 123/57 | HR 93 | Temp 98.4°F | Resp 17 | Ht 71.0 in | Wt 147.3 lb

## 2017-12-12 DIAGNOSIS — Z79899 Other long term (current) drug therapy: Secondary | ICD-10-CM | POA: Insufficient documentation

## 2017-12-12 DIAGNOSIS — C7931 Secondary malignant neoplasm of brain: Secondary | ICD-10-CM | POA: Insufficient documentation

## 2017-12-12 DIAGNOSIS — E119 Type 2 diabetes mellitus without complications: Secondary | ICD-10-CM

## 2017-12-12 DIAGNOSIS — I2510 Atherosclerotic heart disease of native coronary artery without angina pectoris: Secondary | ICD-10-CM

## 2017-12-12 DIAGNOSIS — J700 Acute pulmonary manifestations due to radiation: Secondary | ICD-10-CM | POA: Diagnosis not present

## 2017-12-12 DIAGNOSIS — J90 Pleural effusion, not elsewhere classified: Secondary | ICD-10-CM | POA: Insufficient documentation

## 2017-12-12 DIAGNOSIS — R0602 Shortness of breath: Secondary | ICD-10-CM

## 2017-12-12 DIAGNOSIS — I10 Essential (primary) hypertension: Secondary | ICD-10-CM | POA: Diagnosis not present

## 2017-12-12 DIAGNOSIS — I252 Old myocardial infarction: Secondary | ICD-10-CM | POA: Diagnosis not present

## 2017-12-12 DIAGNOSIS — I700 Atherosclerosis of aorta: Secondary | ICD-10-CM | POA: Diagnosis not present

## 2017-12-12 DIAGNOSIS — C3492 Malignant neoplasm of unspecified part of left bronchus or lung: Secondary | ICD-10-CM

## 2017-12-12 DIAGNOSIS — C778 Secondary and unspecified malignant neoplasm of lymph nodes of multiple regions: Secondary | ICD-10-CM | POA: Insufficient documentation

## 2017-12-12 DIAGNOSIS — Z5112 Encounter for antineoplastic immunotherapy: Secondary | ICD-10-CM | POA: Diagnosis present

## 2017-12-12 DIAGNOSIS — C3412 Malignant neoplasm of upper lobe, left bronchus or lung: Secondary | ICD-10-CM | POA: Diagnosis not present

## 2017-12-12 DIAGNOSIS — Z95828 Presence of other vascular implants and grafts: Secondary | ICD-10-CM

## 2017-12-12 LAB — CMP (CANCER CENTER ONLY)
ALBUMIN: 2.2 g/dL — AB (ref 3.5–5.0)
ALT: 12 U/L (ref 0–44)
AST: 21 U/L (ref 15–41)
Alkaline Phosphatase: 75 U/L (ref 38–126)
Anion gap: 6 (ref 5–15)
BILIRUBIN TOTAL: 0.3 mg/dL (ref 0.3–1.2)
BUN: 18 mg/dL (ref 8–23)
CHLORIDE: 102 mmol/L (ref 98–111)
CO2: 29 mmol/L (ref 22–32)
Calcium: 8.9 mg/dL (ref 8.9–10.3)
Creatinine: 0.76 mg/dL (ref 0.61–1.24)
GFR, Est AFR Am: >60 mL/min (ref >60)
GLUCOSE: 205 mg/dL — AB (ref 70–99)
POTASSIUM: 4.5 mmol/L (ref 3.5–5.1)
Sodium: 137 mmol/L (ref 135–145)
TOTAL PROTEIN: 6.1 g/dL — AB (ref 6.5–8.1)

## 2017-12-12 LAB — CBC WITH DIFFERENTIAL (CANCER CENTER ONLY)
ABS IMMATURE GRANULOCYTES: 0.03 10*3/uL (ref 0.00–0.07)
BASOS PCT: 0 %
Basophils Absolute: 0 10*3/uL (ref 0.0–0.1)
Eosinophils Absolute: 0.7 10*3/uL — ABNORMAL HIGH (ref 0.0–0.5)
Eosinophils Relative: 7 %
HCT: 30.3 % — ABNORMAL LOW (ref 39.0–52.0)
HEMOGLOBIN: 9.4 g/dL — AB (ref 13.0–17.0)
Immature Granulocytes: 0 %
LYMPHS PCT: 3 %
Lymphs Abs: 0.3 10*3/uL — ABNORMAL LOW (ref 0.7–4.0)
MCH: 30.6 pg (ref 26.0–34.0)
MCHC: 31 g/dL (ref 30.0–36.0)
MCV: 98.7 fL (ref 80.0–100.0)
MONO ABS: 0.8 10*3/uL (ref 0.1–1.0)
Monocytes Relative: 8 %
NEUTROS ABS: 8.1 10*3/uL — AB (ref 1.7–7.7)
NEUTROS PCT: 82 %
PLATELETS: 200 10*3/uL (ref 150–400)
RBC: 3.07 MIL/uL — AB (ref 4.22–5.81)
RDW: 16.4 % — ABNORMAL HIGH (ref 11.5–15.5)
WBC: 9.9 10*3/uL (ref 4.0–10.5)
nRBC: 0 % (ref 0.0–0.2)

## 2017-12-12 MED ORDER — HEPARIN SOD (PORK) LOCK FLUSH 100 UNIT/ML IV SOLN
500.0000 [IU] | Freq: Once | INTRAVENOUS | Status: AC | PRN
  Administered 2017-12-12: 500 [IU]
  Filled 2017-12-12: qty 5

## 2017-12-12 MED ORDER — SODIUM CHLORIDE 0.9 % IV SOLN
200.0000 mg | Freq: Once | INTRAVENOUS | Status: AC
  Administered 2017-12-12: 200 mg via INTRAVENOUS
  Filled 2017-12-12: qty 8

## 2017-12-12 MED ORDER — SODIUM CHLORIDE 0.9% FLUSH
10.0000 mL | INTRAVENOUS | Status: DC | PRN
  Administered 2017-12-12: 10 mL
  Filled 2017-12-12: qty 10

## 2017-12-12 MED ORDER — SODIUM CHLORIDE 0.9 % IV SOLN
Freq: Once | INTRAVENOUS | Status: AC
  Administered 2017-12-12: 13:00:00 via INTRAVENOUS
  Filled 2017-12-12: qty 250

## 2017-12-12 MED ORDER — SODIUM CHLORIDE 0.9% FLUSH
10.0000 mL | Freq: Once | INTRAVENOUS | Status: AC
  Administered 2017-12-12: 10 mL
  Filled 2017-12-12: qty 10

## 2017-12-12 NOTE — Progress Notes (Signed)
[facility] Telephone:([phone]) [phone]   [phone]  OFFICE PROGRESS NOTE  [name], MD [address] [place] [address]  DIAGNOSIS: Stage IV (T1b, N3, M1c) non-small cell lung cancer, squamous cell carcinoma diagnosed in September 2018 presented with left infrahilar mass in addition to mediastinal and supraclavicular lymphadenopathy as well as metastatic brain lesion.  PDL1 expression 0%.  PRIOR THERAPY:  1) Palliative radiotherapy to the brain metastasis. 2)  Systemic chemotherapy with carboplatin for AUC of 5, paclitaxel 175 MG/M2 and Ketruda (pembrolizumab) 200 MG IV every 3 weeks. First dose 11/21/2016.  Status post 4 cycles.  CURRENT THERAPY: Maintenance treatment with single agent Keytruda 200 mg IV every 3 weeks.  First cycle March 14, 2017.  Status post 13 cycles.  INTERVAL HISTORY: Steve Frank 84 y.o. male returns to the clinic today for follow-up visit accompanied by his wife.  The patient continues to complain of increasing fatigue and weakness as well as shortness of breath at baseline increased with exertion.  He is currently on home oxygen.  He denied having any recent chest pain but continues to have drainage of bloody fluid from the left lung.  The patient denied having any nausea, vomiting, diarrhea or constipation.  He denied having any fever or chills.  He has no headache or visual changes.  He lost few pounds since his last visit.  He continues to tolerate his treatment with Keytruda fairly well.  He had repeat CT scan of the chest, abdomen and pelvis performed recently and he is here for evaluation and discussion of his discuss results.   MEDICAL HISTORY: Past Medical History:  Diagnosis Date  . Arthritis   . BPH (benign prostatic hyperplasia)   . CAD (coronary artery disease)    s/p 2 cabg's  . Cancer (HCC)    Basal and squamous cell  skin cancer  . Cerebellar mass   . Chest mass   . Diabetes ([place])    Type II  .  GERD (gastroesophageal reflux disease)   . Head injury with loss of consciousness [facility])    age 68 or 11-   . Heart attack ([place])   . HLD (hyperlipidemia)   . Hypertension   . Macular degeneration   . Pneumonia     x 2 last 1071  . Stage IV squamous cell carcinoma of left lung ([place]) 10/20/2016    ALLERGIES:  is allergic to ciprofloxacin.  MEDICATIONS:  Current Outpatient Medications  Medication Sig Dispense Refill  . acetaminophen (TYLENOL) 325 MG tablet Take 650 mg by mouth every 6 (six) hours as needed (for pain/headaches.).     Marland Kitchen albuterol (PROVENTIL HFA;VENTOLIN HFA) 108 (90 Base) MCG/ACT inhaler Inhale 2 puffs into the lungs every 6 (six) hours as needed (for wheezing/shortness of breath).     . B-D INS SYR ULTRAFINE 1CC/31G 31G X 5/16" 1 ML MISC     . bisacodyl (DULCOLAX) 10 MG suppository Place rectally.    . Cholecalciferol (VITAMIN D-1000 MAX ST) 1000 units tablet Take 1,000 Units by mouth daily.     . clotrimazole-betamethasone (LOTRISONE) cream Apply 1 application topically 2 (two) times daily.    Marland Kitchen docusate sodium (COLACE) 100 MG capsule Take 100 mg by mouth 2 (two) times daily as needed (for constipation.).    Marland Kitchen gabapentin (NEURONTIN) 400 MG capsule Take 400 mg by mouth at bedtime.    Marland Kitchen guaiFENesin-dextromethorphan (ROBITUSSIN DM) 100-10 MG/5ML syrup Take 15 mLs every 4 (four)  hours as needed by mouth for cough.    Marland Kitchen HUMALOG 100 UNIT/ML injection INJECT 2 TO 0.12MLS (2-8 UNITS) UNDER THE SKIN 3 TIMES A DAY BEFORE MEALS, AS PER SLIDING SCALE  0  . Lancets (FREESTYLE) lancets     . LANTUS 100 UNIT/ML injection INJECT 0.15ML (15 UNITS TOTAL) UNDER THE SKIN DAILY AT BEDTIME.  0  . lidocaine-prilocaine (EMLA) cream Apply 1 application topically as needed. 30 g 0  . magnesium hydroxide (MILK OF MAGNESIA) 400 MG/5ML suspension Take 30 mLs by mouth daily as needed (for constipation.).    Marland Kitchen mirtazapine (REMERON) 15 MG tablet Take 15 mg by mouth at bedtime.  0  . Multiple Vitamin  (MULTIVITAMIN WITH MINERALS) TABS tablet Take 1 tablet by mouth daily.    Marland Kitchen neomycin-bacitracin-polymyxin (NEOSPORIN) [phone] ointment Apply 1 application topically at bedtime.    Marland Kitchen omeprazole (PRILOSEC) 20 MG capsule Take 20 mg by mouth at bedtime.     . polyethylene glycol (MIRALAX / GLYCOLAX) packet Take 17 g by mouth daily as needed (for constipation.).     Marland Kitchen prochlorperazine (COMPAZINE) 10 MG tablet Take 1 tablet (10 mg total) by mouth every 6 (six) hours as needed for nausea or vomiting. (Patient not taking: Reported on 11/10/2017) 30 tablet 0  . rosuvastatin (CRESTOR) 20 MG tablet Take 20 mg by mouth daily.    . Sodium Phosphates (ENEMA) 7-19 GM/118ML ENEM Place rectally.    . tamsulosin (FLOMAX) 0.4 MG CAPS capsule Take 1 capsule by mouth once.    . TRELEGY ELLIPTA 100-62.5-25 MCG/INH AEPB Inhale 1 puff into the lungs daily.    Marland Kitchen triamcinolone (KENALOG) 0.025 % ointment Apply 1 application 2 (two) times daily topically. (Patient not taking: Reported on 11/10/2017) 30 g 1   No current facility-administered medications for this visit.     SURGICAL HISTORY:  Past Surgical History:  Procedure Laterality Date  . CARDIAC CATHETERIZATION    . COLONOSCOPY W/ POLYPECTOMY    . CORONARY ARTERY BYPASS GRAFT     Dr [facility] 07/2007  . CORONARY ARTERY BYPASS GRAFT     1st one was @ [facility] 26years ago 12/1991  . CRANIECTOMY FOR EXCISION OF BRAIN TUMOR INFRATENTORIAL / POSTERIOR FOSSA  08/05/2016   Dr Rebecka Apley  @ [facility]/Neurosurgery  . FLEXIBLE BRONCHOSCOPY  08/07/2016   Dr Gwenevere Ghazi  . IR FLUORO GUIDE PORT INSERTION RIGHT  11/18/2016  . IR THORACENTESIS ASP PLEURAL SPACE W/IMG GUIDE  03/09/2017  . IR US GUIDE VASC ACCESS RIGHT  11/18/2016  . LUNG BIOPSY N/A 10/14/2016   Procedure: LUNG BIOPSY;  Surgeon: Grace Isaac, MD;  Location: [facility];  Service: Thoracic;  Laterality: N/A;  . VIDEO BRONCHOSCOPY WITH ENDOBRONCHIAL ULTRASOUND N/A 10/14/2016   Procedure: VIDEO  BRONCHOSCOPY WITH ENDOBRONCHIAL ULTRASOUND;  Surgeon: Grace Isaac, MD;  Location: [facility];  Service: Thoracic;  Laterality: N/A;    REVIEW OF SYSTEMS:  Constitutional: positive for fatigue and weight loss Eyes: negative Ears, nose, mouth, throat, and face: negative Respiratory: positive for cough and dyspnea on exertion Cardiovascular: negative Gastrointestinal: negative Genitourinary:negative Integument/breast: negative Hematologic/lymphatic: negative Musculoskeletal:positive for muscle weakness Neurological: negative Behavioral/Psych: negative Endocrine: negative Allergic/Immunologic: negative   PHYSICAL EXAMINATION: General appearance: alert, cooperative, fatigued and no distress Head: Normocephalic, without obvious abnormality, atraumatic Neck: no adenopathy, no JVD, supple, symmetrical, trachea midline and thyroid not enlarged, symmetric, no tenderness/mass/nodules Lymph nodes: Cervical, supraclavicular, and axillary nodes normal. Resp: diminished breath sounds bilaterally and dullness to percussion bilaterally  Back: symmetric, no curvature. ROM normal. No CVA tenderness. Cardio: regular rate and rhythm, S1, S2 normal, no murmur, click, rub or gallop GI: soft, non-tender; bowel sounds normal; no masses,  no organomegaly Extremities: extremities normal, atraumatic, no cyanosis or edema Neurologic: Alert and oriented X 3, normal strength and tone. Normal symmetric reflexes. Normal coordination and gait  ECOG PERFORMANCE STATUS: 1 - Symptomatic but completely ambulatory  Blood pressure (!) 123/57, pulse 93, temperature 98.4 F (36.9 C), temperature source Oral, resp. rate 17, height _0  (1.803 m), weight 147 lb 4.8 oz (66.8 kg), SpO2 98 %.  LABORATORY DATA: Lab Results  Component Value Date   WBC 9.9 12/12/2017   HGB 9.4 (L) 12/12/2017   HCT 30.3 (L) 12/12/2017   MCV 98.7 12/12/2017   PLT 200 12/12/2017      Chemistry      Component Value Date/Time   NA 137  12/12/2017 1019   NA 138 02/08/2017 1118   K 4.5 12/12/2017 1019   K 4.5 02/08/2017 1118   CL 102 12/12/2017 1019   CO2 29 12/12/2017 1019   CO2 24 02/08/2017 1118   BUN 18 12/12/2017 1019   BUN 16.4 02/08/2017 1118   CREATININE 0.76 12/12/2017 1019   CREATININE 0.9 02/08/2017 1118      Component Value Date/Time   CALCIUM 8.9 12/12/2017 1019   CALCIUM 9.0 02/08/2017 1118   ALKPHOS 75 12/12/2017 1019   ALKPHOS 72 02/08/2017 1118   AST 21 12/12/2017 1019   AST 19 02/08/2017 1118   ALT 12 12/12/2017 1019   ALT 15 02/08/2017 1118   BILITOT 0.3 12/12/2017 1019   BILITOT 0.53 02/08/2017 1118       RADIOGRAPHIC STUDIES: Ct Chest W Contrast  Result Date: 12/08/2017 CLINICAL DATA:  Lung cancer with known brain metastases. EXAM: CT CHEST, ABDOMEN, AND PELVIS WITH CONTRAST TECHNIQUE: Multidetector CT imaging of the chest, abdomen and pelvis was performed following the standard protocol during bolus administration of intravenous contrast. CONTRAST:  149m OMNIPAQUE IOHEXOL 300 MG/ML SOLN, 325mOMNIPAQUE IOHEXOL 300 MG/ML SOLN COMPARISON:  07/17/2017 FINDINGS: CT CHEST FINDINGS Cardiovascular: The heart size appears within normal limits. No pericardial effusion. Aortic atherosclerosis. Status post CABG. Mediastinum/Nodes: Normal appearance of the thyroid gland. The trachea appears patent and is midline. Normal appearance of the esophagus. The index subcarinal lymph node measures 1.7 cm, image 36/3. Previously 1.5 cm. Index right hilar lymph node measures 1 cm, image 32/3. Unchanged. No supraclavicular or axillary adenopathy. Lungs/Pleura: Left chest tube in place with decreased volume of left pleural effusion. A small amount of residual left pleural fluid is identified. Mild to moderate change of emphysema. There is thickening of the oblique fissure, new from previous exam. There is progressive masslike architectural distortion and fibrosis within the paramediastinal left lung. Which on today's  study measures 6.8 by 4.0 by 5.3 cm. Subsegmental atelectasis within the left lower lobe is identified medially and posteriorly. There is new ground-glass attenuation and interstitial thickening within the left upper lobe, image number 86/5. Moderate right pleural effusion is again noted. Mild paramediastinal fibrosis within the right lung is identified compatible with changes of external beam radiation. No suspicious pulmonary nodule or mass identified within the right lung. Musculoskeletal: No chest wall mass or suspicious bone lesions identified. Stable mild T6 compression deformity. No chest wall mass or suspicious osseous findings. CT ABDOMEN PELVIS FINDINGS Hepatobiliary: No focal liver abnormality is seen. No gallstones, gallbladder wall thickening, or biliary dilatation. Pancreas: Unremarkable. No pancreatic  ductal dilatation or surrounding inflammatory changes. Spleen: Normal in size without focal abnormality. Adrenals/Urinary Tract: Normal adrenal glands. Small right kidney cysts. Mild bilateral kidney parenchyma volume loss. No mass or hydronephrosis noted. Urinary bladder appears normal. Stomach/Bowel: Stomach is within normal limits. Appendix appears normal. No evidence of bowel wall thickening, distention, or inflammatory changes. Vascular/Lymphatic: Aortic atherosclerosis. No aneurysm. No abdominal no pelvic adenopathy. Reproductive: Prostate is unremarkable. Other: Trace perihepatic fluid identified along the inferior right lobe of liver. No focal fluid collections. Musculoskeletal: No acute or significant osseous findings. IMPRESSION: 1. There has been significant interval progression of masslike architectural distortion within the perihilar left lung. There is also new interstitial thickening within the left upper lobe. New thickening along the oblique fissure of the left lung. Although the progressive masslike architectural distortion in the left lung may represent changes secondary to external  beam radiation recurrent tumor cannot be excluded. The interstitial thickening may represent fibrotic changes but lymphangitic spread of disease would have a similar appearance. Consider further evaluation with PET-CT. 2. Previous enlarged subcarinal lymph node demonstrates mild increased in size from previous exam. 3. Decrease in volume of left pleural effusion status post chest tube placement. 4. Similar appearance of moderate right pleural effusion. 5. Aortic Atherosclerosis (ICD10-I70.0) and Emphysema (ICD10-J43.9). Electronically Signed   By: Kerby Moors M.D.   On: 12/08/2017 22:11   Ct Abdomen Pelvis W Contrast  Result Date: 12/08/2017 CLINICAL DATA:  Lung cancer with known brain metastases. EXAM: CT CHEST, ABDOMEN, AND PELVIS WITH CONTRAST TECHNIQUE: Multidetector CT imaging of the chest, abdomen and pelvis was performed following the standard protocol during bolus administration of intravenous contrast. CONTRAST:  184m OMNIPAQUE IOHEXOL 300 MG/ML SOLN, 368mOMNIPAQUE IOHEXOL 300 MG/ML SOLN COMPARISON:  07/17/2017 FINDINGS: CT CHEST FINDINGS Cardiovascular: The heart size appears within normal limits. No pericardial effusion. Aortic atherosclerosis. Status post CABG. Mediastinum/Nodes: Normal appearance of the thyroid gland. The trachea appears patent and is midline. Normal appearance of the esophagus. The index subcarinal lymph node measures 1.7 cm, image 36/3. Previously 1.5 cm. Index right hilar lymph node measures 1 cm, image 32/3. Unchanged. No supraclavicular or axillary adenopathy. Lungs/Pleura: Left chest tube in place with decreased volume of left pleural effusion. A small amount of residual left pleural fluid is identified. Mild to moderate change of emphysema. There is thickening of the oblique fissure, new from previous exam. There is progressive masslike architectural distortion and fibrosis within the paramediastinal left lung. Which on today's study measures 6.8 by 4.0 by 5.3 cm.  Subsegmental atelectasis within the left lower lobe is identified medially and posteriorly. There is new ground-glass attenuation and interstitial thickening within the left upper lobe, image number 86/5. Moderate right pleural effusion is again noted. Mild paramediastinal fibrosis within the right lung is identified compatible with changes of external beam radiation. No suspicious pulmonary nodule or mass identified within the right lung. Musculoskeletal: No chest wall mass or suspicious bone lesions identified. Stable mild T6 compression deformity. No chest wall mass or suspicious osseous findings. CT ABDOMEN PELVIS FINDINGS Hepatobiliary: No focal liver abnormality is seen. No gallstones, gallbladder wall thickening, or biliary dilatation. Pancreas: Unremarkable. No pancreatic ductal dilatation or surrounding inflammatory changes. Spleen: Normal in size without focal abnormality. Adrenals/Urinary Tract: Normal adrenal glands. Small right kidney cysts. Mild bilateral kidney parenchyma volume loss. No mass or hydronephrosis noted. Urinary bladder appears normal. Stomach/Bowel: Stomach is within normal limits. Appendix appears normal. No evidence of bowel wall thickening, distention, or inflammatory changes. Vascular/Lymphatic: Aortic atherosclerosis.  No aneurysm. No abdominal no pelvic adenopathy. Reproductive: Prostate is unremarkable. Other: Trace perihepatic fluid identified along the inferior right lobe of liver. No focal fluid collections. Musculoskeletal: No acute or significant osseous findings. IMPRESSION: 1. There has been significant interval progression of masslike architectural distortion within the perihilar left lung. There is also new interstitial thickening within the left upper lobe. New thickening along the oblique fissure of the left lung. Although the progressive masslike architectural distortion in the left lung may represent changes secondary to external beam radiation recurrent tumor cannot  be excluded. The interstitial thickening may represent fibrotic changes but lymphangitic spread of disease would have a similar appearance. Consider further evaluation with PET-CT. 2. Previous enlarged subcarinal lymph node demonstrates mild increased in size from previous exam. 3. Decrease in volume of left pleural effusion status post chest tube placement. 4. Similar appearance of moderate right pleural effusion. 5. Aortic Atherosclerosis (ICD10-I70.0) and Emphysema (ICD10-J43.9). Electronically Signed   By: Kerby Moors M.D.   On: 12/08/2017 22:11    ASSESSMENT AND PLAN: This is a very pleasant 84 years old white male with metastatic non-small cell lung cancer, squamous cell carcinoma with negative PDL 1 expression and enlarging solitary brain metastasis.  The patient completed palliative radiotherapy to the brain metastasis. He completed systemic chemotherapy with carboplatin, paclitaxel and Keytruda status post 4 cycles.  He has no evidence for disease progression with this treatment. The patient is currently undergoing treatment with single agent Keytruda status post 13 cycles.   He has been tolerating his treatment with immunotherapy fairly well. The patient had repeat CT scan of the chest, abdomen and pelvis performed recently.  I personally and independently reviewed the scan images and discussed the results with the patient and his wife.  His a scan showed progression of masslike architectural distortion within the perihilar left lung with new interstitial thickening within the left upper lobe concerning for radiation changes but recurrent tumor could not be excluded. I discussed with the patient several options for management of his condition including palliative care and hospice referral versus continuation of his current treatment with immunotherapy and close monitoring on the upcoming scans versus switching to a different chemotherapy regimens. After discussion of all the options the  patient and his wife elected to continue on his current treatment with immunotherapy for now. He will proceed with cycle #14 today as scheduled. I will see him back for follow-up visit in 3 weeks for evaluation before the next cycle of his treatment. For the recurrent pleural effusion the patient will continue with the Pleurx catheter drainage for now. He was advised to call immediately if he has any concerning symptoms in the interval. The patient voices understanding of current disease status and treatment options and is in agreement with the current care plan. All questions were answered. The patient knows to call the clinic with any problems, questions or concerns. We can certainly see the patient much sooner if necessary.  Disclaimer: This note was dictated with voice recognition software. Similar sounding words can inadvertently be transcribed and may not be corrected upon review.

## 2017-12-12 NOTE — Telephone Encounter (Signed)
Printed avs and calender of upcoming appointment. Per 11/5 los

## 2017-12-12 NOTE — Patient Instructions (Signed)
[facility] Discharge Instructions for Patients Receiving Chemotherapy  Today you received the following chemotherapy agents: Pembrolizumab [name]).   To help prevent nausea and vomiting after your treatment, we encourage you to take your nausea medication as directed.   If you develop nausea and vomiting that is not controlled by your nausea medication, call the clinic.   BELOW ARE SYMPTOMS THAT SHOULD BE REPORTED IMMEDIATELY:  *FEVER GREATER THAN 100.5 F  *CHILLS WITH OR WITHOUT FEVER  NAUSEA AND VOMITING THAT IS NOT CONTROLLED WITH YOUR NAUSEA MEDICATION  *UNUSUAL SHORTNESS OF BREATH  *UNUSUAL BRUISING OR BLEEDING  TENDERNESS IN MOUTH AND THROAT WITH OR WITHOUT PRESENCE OF ULCERS  *URINARY PROBLEMS  *BOWEL PROBLEMS  UNUSUAL RASH Items with * indicate a potential emergency and should be followed up as soon as possible.  Feel free to call the clinic should you have any questions or concerns. The clinic phone number is [phone].  Please show the [facility] at check-in to the Emergency Department and triage nurse.

## 2017-12-22 ENCOUNTER — Telehealth: Payer: Self-pay | Admitting: *Deleted

## 2017-12-22 NOTE — Telephone Encounter (Signed)
Received call from wife reporting that Pleurx catheter did not have much drainage today.  Stated 3 days ago, she was able to drain only 100 ml.  Spoke with wife, and was informed that pt was asymptomatic - denied shortness of breath, denied chest pain, no pain at catheter site, denied fullness in chest. Instructed wife to continue to monitor for any symptoms above, and to call office back.  Wife understood to take pt to ER if pt develops chest pain and/or develops breathing problems. Pt's   Phone    [phone].

## 2017-12-27 ENCOUNTER — Other Ambulatory Visit: Payer: Self-pay | Admitting: Radiation Therapy

## 2017-12-27 DIAGNOSIS — C7931 Secondary malignant neoplasm of brain: Secondary | ICD-10-CM

## 2017-12-27 DIAGNOSIS — C7949 Secondary malignant neoplasm of other parts of nervous system: Principal | ICD-10-CM

## 2018-01-02 ENCOUNTER — Inpatient Hospital Stay (HOSPITAL_BASED_OUTPATIENT_CLINIC_OR_DEPARTMENT_OTHER): Payer: Medicare Other | Admitting: Internal Medicine

## 2018-01-02 ENCOUNTER — Inpatient Hospital Stay: Payer: Medicare Other

## 2018-01-02 ENCOUNTER — Encounter: Payer: Self-pay | Admitting: Internal Medicine

## 2018-01-02 VITALS — BP 105/60 | HR 53 | Temp 97.8°F | Resp 22 | Ht 71.0 in | Wt 148.1 lb

## 2018-01-02 DIAGNOSIS — Z923 Personal history of irradiation: Secondary | ICD-10-CM

## 2018-01-02 DIAGNOSIS — Z95828 Presence of other vascular implants and grafts: Secondary | ICD-10-CM

## 2018-01-02 DIAGNOSIS — C7931 Secondary malignant neoplasm of brain: Secondary | ICD-10-CM

## 2018-01-02 DIAGNOSIS — Z5112 Encounter for antineoplastic immunotherapy: Secondary | ICD-10-CM | POA: Diagnosis not present

## 2018-01-02 DIAGNOSIS — C3492 Malignant neoplasm of unspecified part of left bronchus or lung: Secondary | ICD-10-CM

## 2018-01-02 DIAGNOSIS — C3412 Malignant neoplasm of upper lobe, left bronchus or lung: Secondary | ICD-10-CM

## 2018-01-02 DIAGNOSIS — J704 Drug-induced interstitial lung disorders, unspecified: Secondary | ICD-10-CM

## 2018-01-02 DIAGNOSIS — J918 Pleural effusion in other conditions classified elsewhere: Secondary | ICD-10-CM

## 2018-01-02 DIAGNOSIS — C778 Secondary and unspecified malignant neoplasm of lymph nodes of multiple regions: Secondary | ICD-10-CM | POA: Diagnosis not present

## 2018-01-02 LAB — CBC WITH DIFFERENTIAL (CANCER CENTER ONLY)
Abs Immature Granulocytes: 0.05 10*3/uL (ref 0.00–0.07)
Basophils Absolute: 0.1 10*3/uL (ref 0.0–0.1)
Basophils Relative: 1 %
EOS ABS: 0.5 10*3/uL (ref 0.0–0.5)
EOS PCT: 5 %
HCT: 29.3 % — ABNORMAL LOW (ref 39.0–52.0)
HEMOGLOBIN: 9.2 g/dL — AB (ref 13.0–17.0)
IMMATURE GRANULOCYTES: 1 %
LYMPHS ABS: 0.4 10*3/uL — AB (ref 0.7–4.0)
LYMPHS PCT: 4 %
MCH: 29.9 pg (ref 26.0–34.0)
MCHC: 31.4 g/dL (ref 30.0–36.0)
MCV: 95.1 fL (ref 80.0–100.0)
Monocytes Absolute: 0.6 10*3/uL (ref 0.1–1.0)
Monocytes Relative: 7 %
Neutro Abs: 7.6 10*3/uL (ref 1.7–7.7)
Neutrophils Relative %: 82 %
Platelet Count: 212 10*3/uL (ref 150–400)
RBC: 3.08 MIL/uL — ABNORMAL LOW (ref 4.22–5.81)
RDW: 15.5 % (ref 11.5–15.5)
WBC Count: 9.2 10*3/uL (ref 4.0–10.5)
nRBC: 0 % (ref 0.0–0.2)

## 2018-01-02 LAB — CMP (CANCER CENTER ONLY)
ALK PHOS: 66 U/L (ref 38–126)
ALT: 19 U/L (ref 0–44)
AST: 21 U/L (ref 15–41)
Albumin: 2.2 g/dL — ABNORMAL LOW (ref 3.5–5.0)
Anion gap: 7 (ref 5–15)
BUN: 27 mg/dL — AB (ref 8–23)
CALCIUM: 8.7 mg/dL — AB (ref 8.9–10.3)
CO2: 29 mmol/L (ref 22–32)
Chloride: 102 mmol/L (ref 98–111)
Creatinine: 0.75 mg/dL (ref 0.61–1.24)
GFR, Est AFR Am: >60 mL/min (ref >60)
GLUCOSE: 211 mg/dL — AB (ref 70–99)
POTASSIUM: 5 mmol/L (ref 3.5–5.1)
Sodium: 138 mmol/L (ref 135–145)
TOTAL PROTEIN: 6.4 g/dL — AB (ref 6.5–8.1)
Total Bilirubin: <0.2 mg/dL — ABNORMAL LOW (ref 0.3–1.2)

## 2018-01-02 MED ORDER — SODIUM CHLORIDE 0.9 % IV SOLN
200.0000 mg | Freq: Once | INTRAVENOUS | Status: AC
  Administered 2018-01-02: 200 mg via INTRAVENOUS
  Filled 2018-01-02: qty 8

## 2018-01-02 MED ORDER — SODIUM CHLORIDE 0.9 % IV SOLN
Freq: Once | INTRAVENOUS | Status: AC
  Administered 2018-01-02: 12:00:00 via INTRAVENOUS
  Filled 2018-01-02: qty 250

## 2018-01-02 MED ORDER — SODIUM CHLORIDE 0.9% FLUSH
10.0000 mL | INTRAVENOUS | Status: DC | PRN
  Administered 2018-01-02: 10 mL
  Filled 2018-01-02: qty 10

## 2018-01-02 MED ORDER — HEPARIN SOD (PORK) LOCK FLUSH 100 UNIT/ML IV SOLN
500.0000 [IU] | Freq: Once | INTRAVENOUS | Status: AC | PRN
  Administered 2018-01-02: 500 [IU]
  Filled 2018-01-02: qty 5

## 2018-01-02 MED ORDER — SODIUM CHLORIDE 0.9% FLUSH
10.0000 mL | Freq: Once | INTRAVENOUS | Status: AC
  Administered 2018-01-02: 10 mL
  Filled 2018-01-02: qty 10

## 2018-01-02 NOTE — Patient Instructions (Signed)
[facility] Discharge Instructions for Patients Receiving Chemotherapy  Today you received the following chemotherapy agents: Pembrolizumab [name]).   To help prevent nausea and vomiting after your treatment, we encourage you to take your nausea medication as directed.   If you develop nausea and vomiting that is not controlled by your nausea medication, call the clinic.   BELOW ARE SYMPTOMS THAT SHOULD BE REPORTED IMMEDIATELY:  *FEVER GREATER THAN 100.5 F  *CHILLS WITH OR WITHOUT FEVER  NAUSEA AND VOMITING THAT IS NOT CONTROLLED WITH YOUR NAUSEA MEDICATION  *UNUSUAL SHORTNESS OF BREATH  *UNUSUAL BRUISING OR BLEEDING  TENDERNESS IN MOUTH AND THROAT WITH OR WITHOUT PRESENCE OF ULCERS  *URINARY PROBLEMS  *BOWEL PROBLEMS  UNUSUAL RASH Items with * indicate a potential emergency and should be followed up as soon as possible.  Feel free to call the clinic should you have any questions or concerns. The clinic phone number is [phone].  Please show the [facility] at check-in to the Emergency Department and triage nurse.

## 2018-01-02 NOTE — Progress Notes (Signed)
[place] [phone]   [phone]  OFFICE PROGRESS NOTE  [name], MD [address] [place] [address]  DIAGNOSIS: Stage IV (T1b, N3, M1c) non-small cell lung cancer, squamous cell carcinoma diagnosed in September 2018 presented with left infrahilar mass in addition to mediastinal and supraclavicular lymphadenopathy as well as metastatic brain lesion.  PDL1 expression 0%.  PRIOR THERAPY:  1) Palliative radiotherapy to the brain metastasis. 2)  Systemic chemotherapy with carboplatin for AUC of 5, paclitaxel 175 MG/M2 and Ketruda (pembrolizumab) 200 MG IV every 3 weeks. First dose 11/21/2016.  Status post 4 cycles.  CURRENT THERAPY: Maintenance treatment with single agent Keytruda 200 mg IV every 3 weeks.  First cycle March 14, 2017.  Status post 14 cycles.  INTERVAL HISTORY: Steve Frank 84 y.o. male returns to the clinic today for follow-up visit accompanied by his wife.  The patient  continues to complain of increasing fatigue and weakness as well as shortness of breath at baseline increased with exertion.  He is currently on home oxygen.  He denied having any chest pain, but has cough with no hemoptysis.  He denied having any recent weight loss or night sweats.  He has no nausea, vomiting, diarrhea or constipation.  He continues to tolerate his treatment with Keytruda fairly well.  He is here for evaluation before starting cycle #15.   MEDICAL HISTORY: Past Medical History:  Diagnosis Date  . Arthritis   . BPH (benign prostatic hyperplasia)   . CAD (coronary artery disease)    s/p 2 cabg's  . Cancer (HCC)    Basal and squamous cell  skin cancer  . Cerebellar mass   . Chest mass   . Diabetes ([place])    Type II  . GERD (gastroesophageal reflux disease)   . Head injury with loss of consciousness [facility])    age 31 or 59-   . Heart attack ([place])   . HLD (hyperlipidemia)   . Hypertension   . Macular degeneration   .  Pneumonia     x 2 last 1071  . Stage IV squamous cell carcinoma of left lung ([place]) 10/20/2016    ALLERGIES:  is allergic to ciprofloxacin.  MEDICATIONS:  Current Outpatient Medications  Medication Sig Dispense Refill  . acetaminophen (TYLENOL) 325 MG tablet Take 650 mg by mouth every 6 (six) hours as needed (for pain/headaches.).     Marland Kitchen albuterol (PROVENTIL HFA;VENTOLIN HFA) 108 (90 Base) MCG/ACT inhaler Inhale 2 puffs into the lungs every 6 (six) hours as needed (for wheezing/shortness of breath).     . B-D INS SYR ULTRAFINE 1CC/31G 31G X 5/16" 1 ML MISC     . bisacodyl (DULCOLAX) 10 MG suppository Place rectally.    . Cholecalciferol (VITAMIN D-1000 MAX ST) 1000 units tablet Take 1,000 Units by mouth daily.     . clotrimazole-betamethasone (LOTRISONE) cream Apply 1 application topically 2 (two) times daily.    Marland Kitchen docusate sodium (COLACE) 100 MG capsule Take 100 mg by mouth 2 (two) times daily as needed (for constipation.).    Marland Kitchen gabapentin (NEURONTIN) 400 MG capsule Take 400 mg by mouth at bedtime.    Marland Kitchen guaiFENesin-dextromethorphan (ROBITUSSIN DM) 100-10 MG/5ML syrup Take 15 mLs every 4 (four) hours as needed by mouth for cough.    Marland Kitchen HUMALOG 100 UNIT/ML injection INJECT 2 TO 0.12MLS (2-8 UNITS) UNDER THE SKIN 3 TIMES A DAY BEFORE MEALS, AS PER SLIDING SCALE  0  .  Lancets (FREESTYLE) lancets     . LANTUS 100 UNIT/ML injection INJECT 0.15ML (15 UNITS TOTAL) UNDER THE SKIN DAILY AT BEDTIME.  0  . lidocaine-prilocaine (EMLA) cream Apply 1 application topically as needed. 30 g 0  . magnesium hydroxide (MILK OF MAGNESIA) 400 MG/5ML suspension Take 30 mLs by mouth daily as needed (for constipation.).    Marland Kitchen mirtazapine (REMERON) 15 MG tablet Take 15 mg by mouth at bedtime.  0  . Multiple Vitamin (MULTIVITAMIN WITH MINERALS) TABS tablet Take 1 tablet by mouth daily.    Marland Kitchen neomycin-bacitracin-polymyxin (NEOSPORIN) [phone] ointment Apply 1 application topically at bedtime.    Marland Kitchen omeprazole (PRILOSEC)  20 MG capsule Take 20 mg by mouth at bedtime.     . polyethylene glycol (MIRALAX / GLYCOLAX) packet Take 17 g by mouth daily as needed (for constipation.).     Marland Kitchen prochlorperazine (COMPAZINE) 10 MG tablet Take 1 tablet (10 mg total) by mouth every 6 (six) hours as needed for nausea or vomiting. (Patient not taking: Reported on 11/10/2017) 30 tablet 0  . rosuvastatin (CRESTOR) 20 MG tablet Take 20 mg by mouth daily.    . Sodium Phosphates (ENEMA) 7-19 GM/118ML ENEM Place rectally.    . tamsulosin (FLOMAX) 0.4 MG CAPS capsule Take 1 capsule by mouth once.    . TRELEGY ELLIPTA 100-62.5-25 MCG/INH AEPB Inhale 1 puff into the lungs daily.    Marland Kitchen triamcinolone (KENALOG) 0.025 % ointment Apply 1 application 2 (two) times daily topically. (Patient not taking: Reported on 11/10/2017) 30 g 1   No current facility-administered medications for this visit.     SURGICAL HISTORY:  Past Surgical History:  Procedure Laterality Date  . CARDIAC CATHETERIZATION    . COLONOSCOPY W/ POLYPECTOMY    . CORONARY ARTERY BYPASS GRAFT     Dr [facility] 07/2007  . CORONARY ARTERY BYPASS GRAFT     1st one was @ [facility] 26years ago 12/1991  . CRANIECTOMY FOR EXCISION OF BRAIN TUMOR INFRATENTORIAL / POSTERIOR FOSSA  08/05/2016   Dr Rebecka Apley  @ [facility]/Neurosurgery  . FLEXIBLE BRONCHOSCOPY  08/07/2016   Dr Gwenevere Ghazi  . IR FLUORO GUIDE PORT INSERTION RIGHT  11/18/2016  . IR THORACENTESIS ASP PLEURAL SPACE W/IMG GUIDE  03/09/2017  . IR US GUIDE VASC ACCESS RIGHT  11/18/2016  . LUNG BIOPSY N/A 10/14/2016   Procedure: LUNG BIOPSY;  Surgeon: Grace Isaac, MD;  Location: [place];  Service: Thoracic;  Laterality: N/A;  . VIDEO BRONCHOSCOPY WITH ENDOBRONCHIAL ULTRASOUND N/A 10/14/2016   Procedure: VIDEO BRONCHOSCOPY WITH ENDOBRONCHIAL ULTRASOUND;  Surgeon: Grace Isaac, MD;  Location: [place];  Service: Thoracic;  Laterality: N/A;    REVIEW OF SYSTEMS:  A comprehensive review of systems was negative except for:  Constitutional: positive for fatigue Respiratory: positive for cough and dyspnea on exertion Musculoskeletal: positive for muscle weakness   PHYSICAL EXAMINATION: General appearance: alert, cooperative, fatigued and no distress Head: Normocephalic, without obvious abnormality, atraumatic Neck: no adenopathy, no JVD, supple, symmetrical, trachea midline and thyroid not enlarged, symmetric, no tenderness/mass/nodules Lymph nodes: Cervical, supraclavicular, and axillary nodes normal. Resp: diminished breath sounds bilaterally and dullness to percussion bilaterally Back: symmetric, no curvature. ROM normal. No CVA tenderness. Cardio: regular rate and rhythm, S1, S2 normal, no murmur, click, rub or gallop GI: soft, non-tender; bowel sounds normal; no masses,  no organomegaly Extremities: extremities normal, atraumatic, no cyanosis or edema  ECOG PERFORMANCE STATUS: 1 - Symptomatic but completely ambulatory  Blood pressure 105/60, pulse Marland Kitchen)  53, temperature 97.8 F (36.6 C), temperature source Oral, resp. rate (!) 22, height _0  (1.803 m), weight 148 lb 1.6 oz (67.2 kg), SpO2 98 %.  LABORATORY DATA: Lab Results  Component Value Date   WBC 9.2 01/02/2018   HGB 9.2 (L) 01/02/2018   HCT 29.3 (L) 01/02/2018   MCV 95.1 01/02/2018   PLT 212 01/02/2018      Chemistry      Component Value Date/Time   NA 138 01/02/2018 1032   NA 138 02/08/2017 1118   K 5.0 01/02/2018 1032   K 4.5 02/08/2017 1118   CL 102 01/02/2018 1032   CO2 29 01/02/2018 1032   CO2 24 02/08/2017 1118   BUN 27 (H) 01/02/2018 1032   BUN 16.4 02/08/2017 1118   CREATININE 0.75 01/02/2018 1032   CREATININE 0.9 02/08/2017 1118      Component Value Date/Time   CALCIUM 8.7 (L) 01/02/2018 1032   CALCIUM 9.0 02/08/2017 1118   ALKPHOS 66 01/02/2018 1032   ALKPHOS 72 02/08/2017 1118   AST 21 01/02/2018 1032   AST 19 02/08/2017 1118   ALT 19 01/02/2018 1032   ALT 15 02/08/2017 1118   BILITOT <0.2 (L) 01/02/2018 1032    BILITOT 0.53 02/08/2017 1118       RADIOGRAPHIC STUDIES: Ct Chest W Contrast  Result Date: 12/08/2017 CLINICAL DATA:  Lung cancer with known brain metastases. EXAM: CT CHEST, ABDOMEN, AND PELVIS WITH CONTRAST TECHNIQUE: Multidetector CT imaging of the chest, abdomen and pelvis was performed following the standard protocol during bolus administration of intravenous contrast. CONTRAST:  139m OMNIPAQUE IOHEXOL 300 MG/ML SOLN, 382mOMNIPAQUE IOHEXOL 300 MG/ML SOLN COMPARISON:  07/17/2017 FINDINGS: CT CHEST FINDINGS Cardiovascular: The heart size appears within normal limits. No pericardial effusion. Aortic atherosclerosis. Status post CABG. Mediastinum/Nodes: Normal appearance of the thyroid gland. The trachea appears patent and is midline. Normal appearance of the esophagus. The index subcarinal lymph node measures 1.7 cm, image 36/3. Previously 1.5 cm. Index right hilar lymph node measures 1 cm, image 32/3. Unchanged. No supraclavicular or axillary adenopathy. Lungs/Pleura: Left chest tube in place with decreased volume of left pleural effusion. A small amount of residual left pleural fluid is identified. Mild to moderate change of emphysema. There is thickening of the oblique fissure, new from previous exam. There is progressive masslike architectural distortion and fibrosis within the paramediastinal left lung. Which on today's study measures 6.8 by 4.0 by 5.3 cm. Subsegmental atelectasis within the left lower lobe is identified medially and posteriorly. There is new ground-glass attenuation and interstitial thickening within the left upper lobe, image number 86/5. Moderate right pleural effusion is again noted. Mild paramediastinal fibrosis within the right lung is identified compatible with changes of external beam radiation. No suspicious pulmonary nodule or mass identified within the right lung. Musculoskeletal: No chest wall mass or suspicious bone lesions identified. Stable mild T6 compression  deformity. No chest wall mass or suspicious osseous findings. CT ABDOMEN PELVIS FINDINGS Hepatobiliary: No focal liver abnormality is seen. No gallstones, gallbladder wall thickening, or biliary dilatation. Pancreas: Unremarkable. No pancreatic ductal dilatation or surrounding inflammatory changes. Spleen: Normal in size without focal abnormality. Adrenals/Urinary Tract: Normal adrenal glands. Small right kidney cysts. Mild bilateral kidney parenchyma volume loss. No mass or hydronephrosis noted. Urinary bladder appears normal. Stomach/Bowel: Stomach is within normal limits. Appendix appears normal. No evidence of bowel wall thickening, distention, or inflammatory changes. Vascular/Lymphatic: Aortic atherosclerosis. No aneurysm. No abdominal no pelvic adenopathy. Reproductive: Prostate is unremarkable. Other:  Trace perihepatic fluid identified along the inferior right lobe of liver. No focal fluid collections. Musculoskeletal: No acute or significant osseous findings. IMPRESSION: 1. There has been significant interval progression of masslike architectural distortion within the perihilar left lung. There is also new interstitial thickening within the left upper lobe. New thickening along the oblique fissure of the left lung. Although the progressive masslike architectural distortion in the left lung may represent changes secondary to external beam radiation recurrent tumor cannot be excluded. The interstitial thickening may represent fibrotic changes but lymphangitic spread of disease would have a similar appearance. Consider further evaluation with PET-CT. 2. Previous enlarged subcarinal lymph node demonstrates mild increased in size from previous exam. 3. Decrease in volume of left pleural effusion status post chest tube placement. 4. Similar appearance of moderate right pleural effusion. 5. Aortic Atherosclerosis (ICD10-I70.0) and Emphysema (ICD10-J43.9). Electronically Signed   By: Kerby Moors M.D.   On:  12/08/2017 22:11   Ct Abdomen Pelvis W Contrast  Result Date: 12/08/2017 CLINICAL DATA:  Lung cancer with known brain metastases. EXAM: CT CHEST, ABDOMEN, AND PELVIS WITH CONTRAST TECHNIQUE: Multidetector CT imaging of the chest, abdomen and pelvis was performed following the standard protocol during bolus administration of intravenous contrast. CONTRAST:  118m OMNIPAQUE IOHEXOL 300 MG/ML SOLN, 358mOMNIPAQUE IOHEXOL 300 MG/ML SOLN COMPARISON:  07/17/2017 FINDINGS: CT CHEST FINDINGS Cardiovascular: The heart size appears within normal limits. No pericardial effusion. Aortic atherosclerosis. Status post CABG. Mediastinum/Nodes: Normal appearance of the thyroid gland. The trachea appears patent and is midline. Normal appearance of the esophagus. The index subcarinal lymph node measures 1.7 cm, image 36/3. Previously 1.5 cm. Index right hilar lymph node measures 1 cm, image 32/3. Unchanged. No supraclavicular or axillary adenopathy. Lungs/Pleura: Left chest tube in place with decreased volume of left pleural effusion. A small amount of residual left pleural fluid is identified. Mild to moderate change of emphysema. There is thickening of the oblique fissure, new from previous exam. There is progressive masslike architectural distortion and fibrosis within the paramediastinal left lung. Which on today's study measures 6.8 by 4.0 by 5.3 cm. Subsegmental atelectasis within the left lower lobe is identified medially and posteriorly. There is new ground-glass attenuation and interstitial thickening within the left upper lobe, image number 86/5. Moderate right pleural effusion is again noted. Mild paramediastinal fibrosis within the right lung is identified compatible with changes of external beam radiation. No suspicious pulmonary nodule or mass identified within the right lung. Musculoskeletal: No chest wall mass or suspicious bone lesions identified. Stable mild T6 compression deformity. No chest wall mass or  suspicious osseous findings. CT ABDOMEN PELVIS FINDINGS Hepatobiliary: No focal liver abnormality is seen. No gallstones, gallbladder wall thickening, or biliary dilatation. Pancreas: Unremarkable. No pancreatic ductal dilatation or surrounding inflammatory changes. Spleen: Normal in size without focal abnormality. Adrenals/Urinary Tract: Normal adrenal glands. Small right kidney cysts. Mild bilateral kidney parenchyma volume loss. No mass or hydronephrosis noted. Urinary bladder appears normal. Stomach/Bowel: Stomach is within normal limits. Appendix appears normal. No evidence of bowel wall thickening, distention, or inflammatory changes. Vascular/Lymphatic: Aortic atherosclerosis. No aneurysm. No abdominal no pelvic adenopathy. Reproductive: Prostate is unremarkable. Other: Trace perihepatic fluid identified along the inferior right lobe of liver. No focal fluid collections. Musculoskeletal: No acute or significant osseous findings. IMPRESSION: 1. There has been significant interval progression of masslike architectural distortion within the perihilar left lung. There is also new interstitial thickening within the left upper lobe. New thickening along the oblique fissure of the left  lung. Although the progressive masslike architectural distortion in the left lung may represent changes secondary to external beam radiation recurrent tumor cannot be excluded. The interstitial thickening may represent fibrotic changes but lymphangitic spread of disease would have a similar appearance. Consider further evaluation with PET-CT. 2. Previous enlarged subcarinal lymph node demonstrates mild increased in size from previous exam. 3. Decrease in volume of left pleural effusion status post chest tube placement. 4. Similar appearance of moderate right pleural effusion. 5. Aortic Atherosclerosis (ICD10-I70.0) and Emphysema (ICD10-J43.9). Electronically Signed   By: Kerby Moors M.D.   On: 12/08/2017 22:11    ASSESSMENT AND  PLAN: This is a very pleasant 84 years old white male with metastatic non-small cell lung cancer, squamous cell carcinoma with negative PDL 1 expression and enlarging solitary brain metastasis.  The patient completed palliative radiotherapy to the brain metastasis. He completed systemic chemotherapy with carboplatin, paclitaxel and Keytruda status post 4 cycles.  He has no evidence for disease progression with this treatment. The patient is currently undergoing treatment with single agent Keytruda status post 14 cycles.   He continues to tolerate this treatment well with no concerning adverse effect.  He has baseline shortness of breath secondary to bilateral pleural effusion as well as radiation-induced pneumonitis. I recommended for the patient to proceed with cycle #15 today as scheduled. I will see the patient back for follow-up visit in 3 weeks for evaluation before starting the next cycle of his treatment. He was advised to call immediately if he has any other concerning symptoms in the interval. The patient voices understanding of current disease status and treatment options and is in agreement with the current care plan. All questions were answered. The patient knows to call the clinic with any problems, questions or concerns. We can certainly see the patient much sooner if necessary.  Disclaimer: This note was dictated with voice recognition software. Similar sounding words can inadvertently be transcribed and may not be corrected upon review.

## 2018-01-03 ENCOUNTER — Telehealth: Payer: Self-pay | Admitting: Internal Medicine

## 2018-01-03 NOTE — Telephone Encounter (Signed)
3 cycles already scheduled per 11/26 los.

## 2018-01-08 ENCOUNTER — Other Ambulatory Visit: Payer: Medicare Other

## 2018-01-09 ENCOUNTER — Ambulatory Visit: Payer: Medicare Other | Admitting: Urology

## 2018-01-09 ENCOUNTER — Other Ambulatory Visit: Payer: Self-pay

## 2018-01-09 ENCOUNTER — Other Ambulatory Visit: Payer: Self-pay | Admitting: Medical Oncology

## 2018-01-09 ENCOUNTER — Encounter (HOSPITAL_COMMUNITY): Payer: Self-pay | Admitting: Emergency Medicine

## 2018-01-09 ENCOUNTER — Emergency Department (HOSPITAL_COMMUNITY): Payer: Medicare Other

## 2018-01-09 ENCOUNTER — Telehealth: Payer: Self-pay | Admitting: *Deleted

## 2018-01-09 ENCOUNTER — Telehealth: Payer: Self-pay | Admitting: Medical Oncology

## 2018-01-09 ENCOUNTER — Inpatient Hospital Stay (HOSPITAL_COMMUNITY)
Admission: EM | Admit: 2018-01-09 | Discharge: 2018-01-17 | DRG: 189 | Disposition: A | Discharge status: Hospice | Payer: Medicare Other | Attending: Internal Medicine | Admitting: Internal Medicine

## 2018-01-09 DIAGNOSIS — Y842 Radiological procedure and radiotherapy as the cause of abnormal reaction of the patient, or of later complication, without mention of misadventure at the time of the procedure: Secondary | ICD-10-CM | POA: Diagnosis present

## 2018-01-09 DIAGNOSIS — Z515 Encounter for palliative care: Secondary | ICD-10-CM

## 2018-01-09 DIAGNOSIS — Z87891 Personal history of nicotine dependence: Secondary | ICD-10-CM

## 2018-01-09 DIAGNOSIS — R0602 Shortness of breath: Secondary | ICD-10-CM | POA: Diagnosis not present

## 2018-01-09 DIAGNOSIS — E1169 Type 2 diabetes mellitus with other specified complication: Secondary | ICD-10-CM

## 2018-01-09 DIAGNOSIS — J8410 Pulmonary fibrosis, unspecified: Secondary | ICD-10-CM | POA: Diagnosis present

## 2018-01-09 DIAGNOSIS — Z806 Family history of leukemia: Secondary | ICD-10-CM

## 2018-01-09 DIAGNOSIS — I252 Old myocardial infarction: Secondary | ICD-10-CM

## 2018-01-09 DIAGNOSIS — J9601 Acute respiratory failure with hypoxia: Secondary | ICD-10-CM | POA: Diagnosis present

## 2018-01-09 DIAGNOSIS — R042 Hemoptysis: Secondary | ICD-10-CM

## 2018-01-09 DIAGNOSIS — J90 Pleural effusion, not elsewhere classified: Secondary | ICD-10-CM | POA: Diagnosis not present

## 2018-01-09 DIAGNOSIS — J9621 Acute and chronic respiratory failure with hypoxia: Secondary | ICD-10-CM | POA: Diagnosis not present

## 2018-01-09 DIAGNOSIS — Z923 Personal history of irradiation: Secondary | ICD-10-CM

## 2018-01-09 DIAGNOSIS — I10 Essential (primary) hypertension: Secondary | ICD-10-CM | POA: Diagnosis present

## 2018-01-09 DIAGNOSIS — J9811 Atelectasis: Secondary | ICD-10-CM | POA: Diagnosis present

## 2018-01-09 DIAGNOSIS — G9341 Metabolic encephalopathy: Secondary | ICD-10-CM | POA: Diagnosis present

## 2018-01-09 DIAGNOSIS — Y95 Nosocomial condition: Secondary | ICD-10-CM | POA: Diagnosis present

## 2018-01-09 DIAGNOSIS — Z85828 Personal history of other malignant neoplasm of skin: Secondary | ICD-10-CM

## 2018-01-09 DIAGNOSIS — K219 Gastro-esophageal reflux disease without esophagitis: Secondary | ICD-10-CM | POA: Diagnosis present

## 2018-01-09 DIAGNOSIS — E785 Hyperlipidemia, unspecified: Secondary | ICD-10-CM | POA: Diagnosis present

## 2018-01-09 DIAGNOSIS — Z9889 Other specified postprocedural states: Secondary | ICD-10-CM

## 2018-01-09 DIAGNOSIS — Z66 Do not resuscitate: Secondary | ICD-10-CM | POA: Diagnosis present

## 2018-01-09 DIAGNOSIS — J440 Chronic obstructive pulmonary disease with acute lower respiratory infection: Secondary | ICD-10-CM | POA: Diagnosis present

## 2018-01-09 DIAGNOSIS — J189 Pneumonia, unspecified organism: Secondary | ICD-10-CM | POA: Diagnosis present

## 2018-01-09 DIAGNOSIS — H3530 Unspecified macular degeneration: Secondary | ICD-10-CM | POA: Diagnosis present

## 2018-01-09 DIAGNOSIS — C3492 Malignant neoplasm of unspecified part of left bronchus or lung: Secondary | ICD-10-CM | POA: Diagnosis present

## 2018-01-09 DIAGNOSIS — Z79899 Other long term (current) drug therapy: Secondary | ICD-10-CM

## 2018-01-09 DIAGNOSIS — T380X5A Adverse effect of glucocorticoids and synthetic analogues, initial encounter: Secondary | ICD-10-CM | POA: Diagnosis present

## 2018-01-09 DIAGNOSIS — D638 Anemia in other chronic diseases classified elsewhere: Secondary | ICD-10-CM | POA: Diagnosis present

## 2018-01-09 DIAGNOSIS — C801 Malignant (primary) neoplasm, unspecified: Secondary | ICD-10-CM

## 2018-01-09 DIAGNOSIS — Z881 Allergy status to other antibiotic agents status: Secondary | ICD-10-CM

## 2018-01-09 DIAGNOSIS — Z9981 Dependence on supplemental oxygen: Secondary | ICD-10-CM

## 2018-01-09 DIAGNOSIS — Z794 Long term (current) use of insulin: Secondary | ICD-10-CM

## 2018-01-09 DIAGNOSIS — E119 Type 2 diabetes mellitus without complications: Secondary | ICD-10-CM

## 2018-01-09 DIAGNOSIS — J9620 Acute and chronic respiratory failure, unspecified whether with hypoxia or hypercapnia: Secondary | ICD-10-CM

## 2018-01-09 DIAGNOSIS — J918 Pleural effusion in other conditions classified elsewhere: Secondary | ICD-10-CM | POA: Diagnosis present

## 2018-01-09 DIAGNOSIS — C7931 Secondary malignant neoplasm of brain: Secondary | ICD-10-CM | POA: Diagnosis present

## 2018-01-09 DIAGNOSIS — I2510 Atherosclerotic heart disease of native coronary artery without angina pectoris: Secondary | ICD-10-CM | POA: Diagnosis present

## 2018-01-09 DIAGNOSIS — N400 Enlarged prostate without lower urinary tract symptoms: Secondary | ICD-10-CM | POA: Diagnosis present

## 2018-01-09 DIAGNOSIS — Z951 Presence of aortocoronary bypass graft: Secondary | ICD-10-CM

## 2018-01-09 LAB — CBC WITH DIFFERENTIAL/PLATELET
Abs Immature Granulocytes: 0.05 10*3/uL (ref 0.00–0.07)
BASOS PCT: 1 %
Basophils Absolute: 0.1 10*3/uL (ref 0.0–0.1)
EOS ABS: 0.1 10*3/uL (ref 0.0–0.5)
EOS PCT: 1 %
HCT: 30.7 % — ABNORMAL LOW (ref 39.0–52.0)
Hemoglobin: 9.4 g/dL — ABNORMAL LOW (ref 13.0–17.0)
IMMATURE GRANULOCYTES: 1 %
Lymphocytes Relative: 4 %
Lymphs Abs: 0.4 10*3/uL — ABNORMAL LOW (ref 0.7–4.0)
MCH: 29.7 pg (ref 26.0–34.0)
MCHC: 30.6 g/dL (ref 30.0–36.0)
MCV: 96.8 fL (ref 80.0–100.0)
MONOS PCT: 8 %
Monocytes Absolute: 0.8 10*3/uL (ref 0.1–1.0)
NEUTROS PCT: 85 %
NRBC: 0 % (ref 0.0–0.2)
Neutro Abs: 9.2 10*3/uL — ABNORMAL HIGH (ref 1.7–7.7)
PLATELETS: 218 10*3/uL (ref 150–400)
RBC: 3.17 MIL/uL — ABNORMAL LOW (ref 4.22–5.81)
RDW: 15.6 % — AB (ref 11.5–15.5)
WBC: 10.7 10*3/uL — ABNORMAL HIGH (ref 4.0–10.5)

## 2018-01-09 LAB — I-STAT TROPONIN, ED: Troponin i, poc: 0.01 ng/mL (ref 0.00–0.08)

## 2018-01-09 LAB — BASIC METABOLIC PANEL
Anion gap: 9 (ref 5–15)
BUN: 23 mg/dL (ref 8–23)
CHLORIDE: 101 mmol/L (ref 98–111)
CO2: 29 mmol/L (ref 22–32)
Calcium: 8.4 mg/dL — ABNORMAL LOW (ref 8.9–10.3)
Creatinine, Ser: 0.65 mg/dL (ref 0.61–1.24)
GFR calc Af Amer: >60 mL/min (ref >60)
GFR calc non Af Amer: >60 mL/min (ref >60)
GLUCOSE: 212 mg/dL — AB (ref 70–99)
POTASSIUM: 4.1 mmol/L (ref 3.5–5.1)
Sodium: 139 mmol/L (ref 135–145)

## 2018-01-09 MED ORDER — IPRATROPIUM-ALBUTEROL 0.5-2.5 (3) MG/3ML IN SOLN
3.0000 mL | Freq: Once | RESPIRATORY_TRACT | Status: AC
  Administered 2018-01-09: 3 mL via RESPIRATORY_TRACT
  Filled 2018-01-09: qty 3

## 2018-01-09 MED ORDER — INSULIN GLARGINE 100 UNIT/ML ~~LOC~~ SOLN
16.0000 [IU] | Freq: Every day | SUBCUTANEOUS | Status: DC
  Administered 2018-01-11 – 2018-01-16 (×7): 16 [IU] via SUBCUTANEOUS
  Filled 2018-01-09 (×9): qty 0.16

## 2018-01-09 MED ORDER — ACETAMINOPHEN 650 MG RE SUPP
650.0000 mg | Freq: Four times a day (QID) | RECTAL | Status: DC | PRN
  Administered 2018-01-10: 650 mg via RECTAL
  Filled 2018-01-09: qty 1

## 2018-01-09 MED ORDER — TAMSULOSIN HCL 0.4 MG PO CAPS
0.4000 mg | ORAL_CAPSULE | Freq: Every day | ORAL | Status: DC
  Administered 2018-01-10 – 2018-01-16 (×6): 0.4 mg via ORAL
  Filled 2018-01-09 (×6): qty 1

## 2018-01-09 MED ORDER — ONDANSETRON HCL 4 MG PO TABS: 4.0000 mg | ORAL_TABLET | Freq: Four times a day (QID) | ORAL | Status: DC | PRN

## 2018-01-09 MED ORDER — POLYETHYLENE GLYCOL 3350 17 G PO PACK
17.0000 g | PACK | Freq: Every day | ORAL | Status: DC | PRN
  Filled 2018-01-09: qty 1

## 2018-01-09 MED ORDER — ACETAMINOPHEN 325 MG PO TABS: 650.0000 mg | ORAL_TABLET | Freq: Four times a day (QID) | ORAL | Status: DC | PRN

## 2018-01-09 MED ORDER — GUAIFENESIN-DM 100-10 MG/5ML PO SYRP: 15.0000 mL | ORAL_SOLUTION | ORAL | Status: DC | PRN

## 2018-01-09 MED ORDER — MIRTAZAPINE 15 MG PO TABS
15.0000 mg | ORAL_TABLET | Freq: Every day | ORAL | Status: DC
  Administered 2018-01-10 – 2018-01-16 (×6): 15 mg via ORAL
  Filled 2018-01-09 (×6): qty 1

## 2018-01-09 MED ORDER — PANTOPRAZOLE SODIUM 40 MG PO TBEC
40.0000 mg | DELAYED_RELEASE_TABLET | Freq: Every day | ORAL | Status: DC
  Administered 2018-01-10 – 2018-01-17 (×7): 40 mg via ORAL
  Filled 2018-01-09 (×7): qty 1

## 2018-01-09 MED ORDER — DOCUSATE SODIUM 100 MG PO CAPS
100.0000 mg | ORAL_CAPSULE | Freq: Two times a day (BID) | ORAL | Status: DC | PRN
  Administered 2018-01-15: 100 mg via ORAL
  Filled 2018-01-09: qty 1

## 2018-01-09 MED ORDER — GABAPENTIN 400 MG PO CAPS
400.0000 mg | ORAL_CAPSULE | Freq: Every day | ORAL | Status: DC
  Administered 2018-01-10 – 2018-01-16 (×6): 400 mg via ORAL
  Filled 2018-01-09 (×6): qty 1

## 2018-01-09 MED ORDER — ONDANSETRON HCL 4 MG/2ML IJ SOLN: 4.0000 mg | Freq: Four times a day (QID) | INTRAMUSCULAR | Status: DC | PRN

## 2018-01-09 MED ORDER — ROSUVASTATIN CALCIUM 20 MG PO TABS
20.0000 mg | ORAL_TABLET | Freq: Every day | ORAL | Status: DC
  Administered 2018-01-10 – 2018-01-17 (×7): 20 mg via ORAL
  Filled 2018-01-09 (×7): qty 1

## 2018-01-09 MED ORDER — SODIUM CHLORIDE 0.9% FLUSH
10.0000 mL | INTRAVENOUS | Status: DC | PRN
  Administered 2018-01-15: 10 mL
  Administered 2018-01-17: 30 mL
  Filled 2018-01-09 (×2): qty 40

## 2018-01-09 MED ORDER — VITAMIN D 25 MCG (1000 UNIT) PO TABS
1000.0000 [IU] | ORAL_TABLET | Freq: Every day | ORAL | Status: DC
  Administered 2018-01-10 – 2018-01-17 (×7): 1000 [IU] via ORAL
  Filled 2018-01-09 (×7): qty 1

## 2018-01-09 MED ORDER — ADULT MULTIVITAMIN W/MINERALS CH
1.0000 | ORAL_TABLET | Freq: Every day | ORAL | Status: DC
  Administered 2018-01-10 – 2018-01-17 (×7): 1 via ORAL
  Filled 2018-01-09 (×8): qty 1

## 2018-01-09 MED ORDER — INSULIN ASPART 100 UNIT/ML ~~LOC~~ SOLN
0.0000 [IU] | Freq: Three times a day (TID) | SUBCUTANEOUS | Status: DC
  Administered 2018-01-10: 2 [IU] via SUBCUTANEOUS
  Administered 2018-01-10: 3 [IU] via SUBCUTANEOUS

## 2018-01-09 NOTE — ED Notes (Signed)
ED TO INPATIENT HANDOFF REPORT  Name/Age/Gender Steve Frank 84 y.o. male  Code Status Code Status History    Date Active Date Inactive Code Status Order ID Comments User Context   12/22/2016 0213 12/26/2016 1754 Full Code [id number]  Phillips Grout, MD ED    Advance Directive Documentation     Most Recent Value  Type of Advance Directive  Healthcare Power of Attorney, Living will  Pre-existing out of facility DNR order (yellow form or pink MOST form)  -  "MOST" Form in Place?  -      Home/SNF/Other Given to floor  Chief Complaint Trouble Breathing  Level of Care/Admitting Diagnosis ED Disposition    ED Disposition Condition [place]: [place] [[id number]]  Level of Care: Telemetry [5]  Admit to tele based on following criteria: Monitor for Ischemic changes  Diagnosis: Acute respiratory failure with hypoxia [facility]) [338250]  Admitting Physician: Rise Patience [phone]  Attending Physician: Rise Patience [email or web address]  PT Class (Do Not Modify): Observation [104]  PT Acc Code (Do Not Modify): Observation [10022]       Medical History Past Medical History:  Diagnosis Date  . Arthritis   . BPH (benign prostatic hyperplasia)   . CAD (coronary artery disease)    s/p 2 cabg's  . Cancer (HCC)    Basal and squamous cell  skin cancer  . Cerebellar mass   . Chest mass   . Diabetes ([place])    Type II  . GERD (gastroesophageal reflux disease)   . Head injury with loss of consciousness [facility])    age 50 or 75-   . Heart attack ([place])   . HLD (hyperlipidemia)   . Hypertension   . Macular degeneration   . Pneumonia     x 2 last 1071  . Stage IV squamous cell carcinoma of left lung ([place]) 10/20/2016    Allergies Allergies  Allergen Reactions  . Ciprofloxacin Nausea And Vomiting    IV Location/Drains/Wounds Patient Lines/Drains/Airways Status   Active Line/Drains/Airways    Name:   Placement date:   Placement time:   Site:    Days:   Implanted Port Right Chest   -    -    Chest             Labs/Imaging Results for orders placed or performed during the hospital encounter of 01/09/18 (from the past 48 hour(s))  CBC with Differential     Status: Abnormal   Collection Time: 01/09/18  4:48 PM  Result Value Ref Range   WBC 10.7 (H) 4.0 - 10.5 K/uL   RBC 3.17 (L) 4.22 - 5.81 MIL/uL   Hemoglobin 9.4 (L) 13.0 - 17.0 g/dL   HCT 30.7 (L) 39.0 - 52.0 %   MCV 96.8 80.0 - 100.0 fL   MCH 29.7 26.0 - 34.0 pg   MCHC 30.6 30.0 - 36.0 g/dL   RDW 15.6 (H) 11.5 - 15.5 %   Platelets 218 150 - 400 K/uL   nRBC 0.0 0.0 - 0.2 %   Neutrophils Relative % 85 %   Neutro Abs 9.2 (H) 1.7 - 7.7 K/uL   Lymphocytes Relative 4 %   Lymphs Abs 0.4 (L) 0.7 - 4.0 K/uL   Monocytes Relative 8 %   Monocytes Absolute 0.8 0.1 - 1.0 K/uL   Eosinophils Relative 1 %   Eosinophils Absolute 0.1 0.0 - 0.5 K/uL   Basophils Relative 1 %  Basophils Absolute 0.1 0.0 - 0.1 K/uL   Immature Granulocytes 1 %   Abs Immature Granulocytes 0.05 0.00 - 0.07 K/uL    Comment: Performed at [facility], [place] [address], [place], [place] [address]  Basic metabolic panel     Status: Abnormal   Collection Time: 01/09/18  4:48 PM  Result Value Ref Range   Sodium 139 135 - 145 mmol/L   Potassium 4.1 3.5 - 5.1 mmol/L   Chloride 101 98 - 111 mmol/L   CO2 29 22 - 32 mmol/L   Glucose, Bld 212 (H) 70 - 99 mg/dL   BUN 23 8 - 23 mg/dL   Creatinine, Ser 0.65 0.61 - 1.24 mg/dL   Calcium 8.4 (L) 8.9 - 10.3 mg/dL   GFR calc non Af Amer >60 >60 mL/min   GFR calc Af Amer >60 >60 mL/min   Anion gap 9 5 - 15    Comment: Performed at [facility], [place] [address], [place], [place] [address]  I-Stat Troponin, ED (not at [facility])     Status: None   Collection Time: 01/09/18  6:14 PM  Result Value Ref Range   Troponin i, poc 0.01 0.00 - 0.08 ng/mL   Comment 3            Comment: Due to the release kinetics of cTnI, a negative result within  the first hours of the onset of symptoms does not rule out myocardial infarction with certainty. If myocardial infarction is still suspected, repeat the test at appropriate intervals.    Dg Chest 2 View  Result Date: 01/09/2018 CLINICAL DATA:  History of left lung cancer.  Cough. EXAM: CHEST - 2 VIEW COMPARISON:  CT scan December 08, 2017 FINDINGS: There is a small right pleural effusion which is similar in the interval. Opacity under the effusion is likely atelectasis. Opacity in the left perihilar region is at the site of the patient's treated malignancy. Hazy opacity is seen in the lower left chest with a left-sided effusion also noted. No pneumothorax. Stable right Port-A-Cath. No other acute interval changes. IMPRESSION: 1. Bilateral pleural effusions, left greater than right. 2. Opacity in left perihilar region is at least partially explained by the patient's known treated malignancy. 3. Hazy opacity more inferiorly in the left lung may represent layering effusion and underlying opacity/atelectasis. Electronically Signed   By: Dorise Bullion III M.D   On: 01/09/2018 17:13    Pending Labs Unresulted Labs (From admission, onward)   None      Vitals/Pain Today's Vitals   01/09/18 1930 01/09/18 2000 01/09/18 2030 01/09/18 2100  BP: (!) 167/75 (!) 158/68 (!) 170/77 (!) 167/79  Pulse: 67 65 70 71  Resp:    (!) 24  Temp:      TempSrc:      SpO2: 94% 97% 96% 94%  Weight:      Height:      PainSc:        Isolation Precautions No active isolations  Medications Medications  ipratropium-albuterol (DUONEB) 0.5-2.5 (3) MG/3ML nebulizer solution 3 mL (3 mLs Nebulization Given 01/09/18 1713)    Mobility walks with person assist

## 2018-01-09 NOTE — Telephone Encounter (Signed)
Respiratory - pt more SOB .She is not getting any drainage out o fpleurix catheter. Per Julien Nordmann I instructed wife to get pt to ED for evaluation.

## 2018-01-09 NOTE — H&P (Signed)
History and Physical    Steve Frank TML:[id number] DOB: 09/25/1931 DOA: 01/09/2018  PCP: [name], MD  Patient coming from: Home.  Chief Complaint: Shortness of breath.  HPI: Steve Frank is a 84 y.o. male with history of metastatic non-small cell lung cancer, CAD status post CABG, COPD presently on Keytruda being followed by Dr. Julien Nordmann, oncologist presents with worsening shortness of breath over the last 2 weeks.  As per the oncology notes patient has chronic shortness of breath secondary to pleural effusion and radiation pneumonitis.  Patient states his shortness of breath is acutely worsened and he has left-sided Pleurx catheter which has stopped draining for last 2 weeks gradually.  Patient also has been having some productive cough but denies any fever or chills.  ED Course: On exam patient is mildly short of breath with no obvious wheezing.  Chest x-ray shows bilateral pleural effusion.  Patient admitted for shortness of breath likely from worsening pleural effusion and may need interventional radiology assessment for replacement of the Pleurx catheter.  Eventually may need also right-sided thoracentesis also.  Review of Systems: As per HPI, rest all negative.   Past Medical History:  Diagnosis Date  . Arthritis   . BPH (benign prostatic hyperplasia)   . CAD (coronary artery disease)    s/p 2 cabg's  . Cancer (HCC)    Basal and squamous cell  skin cancer  . Cerebellar mass   . Chest mass   . Diabetes ([place])    Type II  . GERD (gastroesophageal reflux disease)   . Head injury with loss of consciousness [facility])    age 56 or 77-   . Heart attack ([place])   . HLD (hyperlipidemia)   . Hypertension   . Macular degeneration   . Pneumonia     x 2 last 1071  . Stage IV squamous cell carcinoma of left lung ([place]) 10/20/2016    Past Surgical History:  Procedure Laterality Date  . CARDIAC CATHETERIZATION    . COLONOSCOPY W/ POLYPECTOMY    . CORONARY ARTERY BYPASS GRAFT     Dr Prescott Gum [facility] 07/2007  . CORONARY ARTERY BYPASS GRAFT     1st one was @ [facility] 26years ago 12/1991  . CRANIECTOMY FOR EXCISION OF BRAIN TUMOR INFRATENTORIAL / POSTERIOR FOSSA  08/05/2016   Dr Rebecka Apley  @ [facility]/Neurosurgery  . FLEXIBLE BRONCHOSCOPY  08/07/2016   Dr Gwenevere Ghazi  . IR FLUORO GUIDE PORT INSERTION RIGHT  11/18/2016  . IR THORACENTESIS ASP PLEURAL SPACE W/IMG GUIDE  03/09/2017  . IR US GUIDE VASC ACCESS RIGHT  11/18/2016  . LUNG BIOPSY N/A 10/14/2016   Procedure: LUNG BIOPSY;  Surgeon: Grace Isaac, MD;  Location: [place];  Service: Thoracic;  Laterality: N/A;  . VIDEO BRONCHOSCOPY WITH ENDOBRONCHIAL ULTRASOUND N/A 10/14/2016   Procedure: VIDEO BRONCHOSCOPY WITH ENDOBRONCHIAL ULTRASOUND;  Surgeon: Grace Isaac, MD;  Location: [place];  Service: Thoracic;  Laterality: N/A;     reports that he quit smoking about 32 years ago. His smoking use included cigarettes. He has a 70.00 pack-year smoking history. He has quit using smokeless tobacco. He reports that he does not drink alcohol or use drugs.  Allergies  Allergen Reactions  . Ciprofloxacin Nausea And Vomiting    Family History  Problem Relation Age of Onset  . Cancer Brother        lung to head and neck involving lymph nodes  . Cancer Paternal Aunt  breast  . Cancer Paternal Uncle        leukemia  . Cancer Paternal Aunt        breast  . Cancer Paternal Uncle        lung    Prior to Admission medications   Medication Sig Start Date End Date Taking? Authorizing Provider  acetaminophen (TYLENOL) 325 MG tablet Take 650 mg by mouth every 6 (six) hours as needed for mild pain, fever or headache (for pain/headaches.).  08/11/16  Yes [provider]  albuterol (PROVENTIL HFA;VENTOLIN HFA) 108 (90 Base) MCG/ACT inhaler Inhale 2 puffs into the lungs every 6 (six) hours as needed (for wheezing/shortness of breath).  08/23/16  Yes [provider]  Cholecalciferol (VITAMIN D-1000 MAX ST)  1000 units tablet Take 1,000 Units by mouth daily.    Yes [provider]  docusate sodium (COLACE) 100 MG capsule Take 100 mg by mouth 2 (two) times daily as needed (for constipation.).   Yes [provider]  gabapentin (NEURONTIN) 400 MG capsule Take 400 mg by mouth at bedtime.   Yes [provider]  guaiFENesin-dextromethorphan (ROBITUSSIN DM) 100-10 MG/5ML syrup Take 15 mLs every 4 (four) hours as needed by mouth for cough.   Yes [provider]  HUMALOG 100 UNIT/ML injection Inject 2-8 Units into the skin See admin instructions. Inject 2-8 units under the skin three times daily before meals, per sliding scale. 08/27/16  Yes [provider]  LANTUS 100 UNIT/ML injection Inject 16 Units into the skin at bedtime.  08/23/16  Yes [provider]  magnesium hydroxide (MILK OF MAGNESIA) 400 MG/5ML suspension Take 30 mLs by mouth daily as needed (for constipation.).   Yes [provider]  mirtazapine (REMERON) 15 MG tablet Take 15 mg by mouth at bedtime. 08/23/16  Yes [provider]  Multiple Vitamin (MULTIVITAMIN WITH MINERALS) TABS tablet Take 1 tablet by mouth daily.   Yes [provider]  neomycin-bacitracin-polymyxin (NEOSPORIN) [phone] ointment Apply 1 application topically at bedtime.   Yes [provider]  omeprazole (PRILOSEC) 20 MG capsule Take 20 mg by mouth at bedtime.  07/25/16  Yes [provider]  polyethylene glycol (MIRALAX / GLYCOLAX) packet Take 17 g by mouth daily as needed (for constipation.).    Yes [provider]  rosuvastatin (CRESTOR) 20 MG tablet Take 20 mg by mouth daily.   Yes [provider]  tamsulosin (FLOMAX) 0.4 MG CAPS capsule Take 0.4 mg by mouth at bedtime.  03/25/17  Yes [provider]  B-D INS SYR ULTRAFINE 1CC/31G 31G X 5/16" 1 ML MISC  10/12/16   [provider]  clotrimazole-betamethasone (LOTRISONE) cream Apply 1 application  topically 2 (two) times daily.    [provider]  Lancets (FREESTYLE) lancets  05/01/17   [provider]  lidocaine-prilocaine (EMLA) cream Apply 1 application topically as needed. Patient not taking: Reported on 01/09/2018 11/09/16   Curt Bears, MD  prochlorperazine (COMPAZINE) 10 MG tablet Take 1 tablet (10 mg total) by mouth every 6 (six) hours as needed for nausea or vomiting. Patient not taking: Reported on 11/10/2017 11/09/16   Curt Bears, MD  TRELEGY ELLIPTA 100-62.5-25 MCG/INH AEPB Inhale 1 puff into the lungs daily. 10/05/17   [provider]  triamcinolone (KENALOG) 0.025 % ointment Apply 1 application 2 (two) times daily topically. Patient not taking: Reported on 11/10/2017 12/20/16   Maryanna Shape, NP    Physical Exam: Vitals:   01/09/18 2000 01/09/18 2030  01/09/18 2100 01/09/18 2201  BP: (!) 158/68 (!) 170/77 (!) 167/79 (!) 165/75  Pulse: 65 70 71 71  Resp:   (!) 24 20  Temp:    98.2 F (36.8 C)  TempSrc:    Oral  SpO2: 97% 96% 94% 91%  Weight:      Height:          Constitutional: Moderately built and nourished. Vitals:   01/09/18 2000 01/09/18 2030 01/09/18 2100 01/09/18 2201  BP: (!) 158/68 (!) 170/77 (!) 167/79 (!) 165/75  Pulse: 65 70 71 71  Resp:   (!) 24 20  Temp:    98.2 F (36.8 C)  TempSrc:    Oral  SpO2: 97% 96% 94% 91%  Weight:      Height:       Eyes: Anicteric no pallor. ENMT: No discharge from the ears eyes nose or mouth. Neck: No mass felt.  No neck rigidity.  No JVD appreciated. Respiratory: No rhonchi or crepitations. Cardiovascular: S1-S2 heard. Abdomen: Soft nontender bowel sounds present. Musculoskeletal: No edema. Skin: No rash. Neurologic: Alert awake oriented to time place and person.  Moves all extremities. Psychiatric: Appears normal.   Labs on Admission: I have personally reviewed following labs and imaging studies  CBC: Recent Labs  Lab 01/09/18 1648  WBC 10.7*  NEUTROABS 9.2*    HGB 9.4*  HCT 30.7*  MCV 96.8  PLT 627   Basic Metabolic Panel: Recent Labs  Lab 01/09/18 1648  NA 139  K 4.1  CL 101  CO2 29  GLUCOSE 212*  BUN 23  CREATININE 0.65  CALCIUM 8.4*   GFR: Estimated Creatinine Clearance: 64.8 mL/min (by C-G formula based on SCr of 0.65 mg/dL). Liver Function Tests: No results for input(s): AST, ALT, ALKPHOS, BILITOT, PROT, ALBUMIN in the last 168 hours. No results for input(s): LIPASE, AMYLASE in the last 168 hours. No results for input(s): AMMONIA in the last 168 hours. Coagulation Profile: No results for input(s): INR, PROTIME in the last 168 hours. Cardiac Enzymes: No results for input(s): CKTOTAL, CKMB, CKMBINDEX, TROPONINI in the last 168 hours. BNP (last 3 results) No results for input(s): PROBNP in the last 8760 hours. HbA1C: No results for input(s): HGBA1C in the last 72 hours. CBG: No results for input(s): GLUCAP in the last 168 hours. Lipid Profile: No results for input(s): CHOL, HDL, LDLCALC, TRIG, CHOLHDL, LDLDIRECT in the last 72 hours. Thyroid Function Tests: No results for input(s): TSH, T4TOTAL, FREET4, T3FREE, THYROIDAB in the last 72 hours. Anemia Panel: No results for input(s): VITAMINB12, FOLATE, FERRITIN, TIBC, IRON, RETICCTPCT in the last 72 hours. Urine analysis:    Component Value Date/Time   COLORURINE YELLOW 12/22/2016 0602   APPEARANCEUR CLEAR 12/22/2016 0602   LABSPEC 1.017 12/22/2016 0602   PHURINE 5.0 12/22/2016 0602   GLUCOSEU NEGATIVE 12/22/2016 0602   HGBUR NEGATIVE 12/22/2016 0602   BILIRUBINUR NEGATIVE 12/22/2016 0602   KETONESUR 5 (A) 12/22/2016 0602   PROTEINUR 100 (A) 12/22/2016 0602   NITRITE NEGATIVE 12/22/2016 0602   LEUKOCYTESUR NEGATIVE 12/22/2016 0602   Sepsis Labs: _0 (procalcitonin:4,lacticidven:4) )No results found for this or any previous visit (from the past 240 hour(s)).   Radiological Exams on Admission: Dg Chest 2 View  Result Date: 01/09/2018 CLINICAL DATA:   History of left lung cancer.  Cough. EXAM: CHEST - 2 VIEW COMPARISON:  CT scan December 08, 2017 FINDINGS: There is a small right pleural effusion which is similar in the interval. Opacity under the effusion is  likely atelectasis. Opacity in the left perihilar region is at the site of the patient's treated malignancy. Hazy opacity is seen in the lower left chest with a left-sided effusion also noted. No pneumothorax. Stable right Port-A-Cath. No other acute interval changes. IMPRESSION: 1. Bilateral pleural effusions, left greater than right. 2. Opacity in left perihilar region is at least partially explained by the patient's known treated malignancy. 3. Hazy opacity more inferiorly in the left lung may represent layering effusion and underlying opacity/atelectasis. Electronically Signed   By: Dorise Bullion III M.D   On: 01/09/2018 17:13    EKG: Independently reviewed.  Normal sinus rhythm.  Assessment/Plan Principal Problem:   Acute respiratory failure with hypoxia (HCC) Active Problems:   Solitary left cerebellar brain metastasis (HCC)   Diabetes mellitus (HCC)   Essential hypertension   Stage IV squamous cell carcinoma of left lung (HCC)   Pleural effusion, bilateral    1. Acute respiratory failure with hypoxia likely from worsening pleural effusion with left-sided Pleurx catheter presently not draining.  Will get IR consult for the Pleurx catheter and eventually patient also may need right-sided thoracentesis.  Since patient has benign exam productive cough we will keep patient on Levaquin for now and get sputum cultures.  As per the oncology notes patient also has radiation pneumonitis. 2. COPD on home inhalers.  Not actively wheezing. 3. Diabetes mellitus type 2 on Lantus insulin and sliding scale coverage. 4. Anemia likely from malignancy follow CBC. 5. Metastatic non-small cell lung cancer being followed by Dr. Julien Nordmann.   DVT prophylaxis: SCDs in anticipation of possible  thoracentesis. Code Status: Full code. Family Communication: Discussed with patient. Disposition Plan: Home. Consults called: Interventional radiology. Admission status: Observation.   Rise Patience MD [facility] Pager [phone].  If 7PM-7AM, please contact night-coverage [email or web address] Password [facility]  01/09/2018, 10:43 PM

## 2018-01-09 NOTE — ED Provider Notes (Signed)
[facility] DEPT Provider Note   CSN: [id number] Arrival date & time: 01/09/18  1617     History   Chief Complaint Chief Complaint  Patient presents with  . Shortness of Breath  . Cough    HPI BREYDON SENTERS is a 84 y.o. male.  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  Patient states that he is chronically on 3 L of O2 at home however over the last several days has had a increase his oxygen requirement to 3.5 L at home.  Has been getting more dyspneic with his ADLs.  Reports that he has had an ongoing cough for the last several weeks.  Reports cough is productive of white sputum.  No chest pain.  No pain with inspiration.  Patient notes that he had a chest tube placed after having a left-sided pleural effusion.  He states that he has not had any drainage from the tube in several weeks.  Patient states that he has had sweats and chills at night but no documented fevers.  Reports chronic rhinorrhea from his nasal cannulae but no significantly worse rhinorrhea, nasal congestion sore throat or other symptoms.  No abdominal pain, nausea, vomiting or diarrhea.  No urinary symptoms.  Reports chronic left lower extremity swelling following his CABG that is unchanged today.  Oncologist: Dr. Earlie Server   Past Medical History:  Diagnosis Date  . Arthritis   . BPH (benign prostatic hyperplasia)   . CAD (coronary artery disease)    s/p 2 cabg's  . Cancer (HCC)    Basal and squamous cell  skin cancer  . Cerebellar mass   . Chest mass   . Diabetes ([place])    Type II  . GERD (gastroesophageal reflux disease)   . Head injury with loss of consciousness [facility])    age 39 or 81-   . Heart attack  ([place])   . HLD (hyperlipidemia)   . Hypertension   . Macular degeneration   . Pneumonia     x 2 last 1071  . Stage IV squamous cell carcinoma of left lung ([place]) 10/20/2016    Patient Active Problem List   Diagnosis Date Noted  . Acute respiratory failure with hypoxia ([place]) 01/09/2018  . Brain metastases ([place]) 08/22/2017  . Encounter for antineoplastic immunotherapy 04/04/2017  . Bacteremia 12/23/2016  . S/P thoracentesis   . PNA (pneumonia) 12/22/2016  . Neutropenic fever ([place]) 12/22/2016  . Pleural effusion 12/22/2016  . Pancytopenia ([place]) 12/22/2016  . Lobar pneumonia ([place])   . Neutropenia ([place]) 12/20/2016  . Thrombocytopenia ([place]) 12/20/2016  . Dermatitis 12/20/2016  . Esophagitis 12/20/2016  . Constipation 12/20/2016  . Port-A-Cath in place 12/12/2016  . Goals of care, counseling/discussion 10/22/2016  . Encounter for antineoplastic chemotherapy 10/22/2016  . Stage IV squamous cell carcinoma of left lung ([place]) 10/20/2016  . Generalized weakness 09/09/2016  . Hyponatremia 09/09/2016  . Acute encephalopathy 09/05/2016  . BPH (benign prostatic hyperplasia) 09/04/2016  . Lung mass 08/08/2016  . Oropharyngeal dysphagia 08/08/2016  . Solitary left cerebellar brain metastasis ([place]) 08/04/2016  . Diabetes mellitus ([place]) 08/04/2016  . Essential hypertension 08/04/2016  . HLD (hyperlipidemia) 08/04/2016  . Loss of coordination 08/03/2016  . Atherosclerosis of native  coronary artery of native heart 05/31/2015  . Diabetic polyneuropathy associated with type 2 diabetes mellitus ([place]) 05/31/2015  . Emphysema lung ([place]) 05/31/2015  . GERD (gastroesophageal reflux disease) 05/31/2015    Past Surgical History:  Procedure Laterality Date  . CARDIAC CATHETERIZATION    . COLONOSCOPY W/ POLYPECTOMY    . CORONARY ARTERY BYPASS GRAFT     Dr Prescott Gum [facility] 07/2007  . CORONARY ARTERY BYPASS GRAFT     1st one was @ [facility] 26years ago 12/1991  . CRANIECTOMY FOR EXCISION OF BRAIN TUMOR  INFRATENTORIAL / POSTERIOR FOSSA  08/05/2016   Dr Rebecka Apley  @ [facility]/Neurosurgery  . FLEXIBLE BRONCHOSCOPY  08/07/2016   Dr Gwenevere Ghazi  . IR FLUORO GUIDE PORT INSERTION RIGHT  11/18/2016  . IR THORACENTESIS ASP PLEURAL SPACE W/IMG GUIDE  03/09/2017  . IR US GUIDE VASC ACCESS RIGHT  11/18/2016  . LUNG BIOPSY N/A 10/14/2016   Procedure: LUNG BIOPSY;  Surgeon: Grace Isaac, MD;  Location: [facility];  Service: Thoracic;  Laterality: N/A;  . VIDEO BRONCHOSCOPY WITH ENDOBRONCHIAL ULTRASOUND N/A 10/14/2016   Procedure: VIDEO BRONCHOSCOPY WITH ENDOBRONCHIAL ULTRASOUND;  Surgeon: Grace Isaac, MD;  Location: [facility];  Service: Thoracic;  Laterality: N/A;        Home Medications    Prior to Admission medications   Medication Sig Start Date End Date Taking? Authorizing Provider  acetaminophen (TYLENOL) 325 MG tablet Take 650 mg by mouth every 6 (six) hours as needed for mild pain, fever or headache (for pain/headaches.).  08/11/16  Yes [provider]  albuterol (PROVENTIL HFA;VENTOLIN HFA) 108 (90 Base) MCG/ACT inhaler Inhale 2 puffs into the lungs every 6 (six) hours as needed (for wheezing/shortness of breath).  08/23/16  Yes [provider]  Cholecalciferol (VITAMIN D-1000 MAX ST) 1000 units tablet Take 1,000 Units by mouth daily.    Yes [provider]  docusate sodium (COLACE) 100 MG capsule Take 100 mg by mouth 2 (two) times daily as needed (for constipation.).   Yes [provider]  gabapentin (NEURONTIN) 400 MG capsule Take 400 mg by mouth at bedtime.   Yes [provider]  guaiFENesin-dextromethorphan (ROBITUSSIN DM) 100-10 MG/5ML syrup Take 15 mLs every 4 (four) hours as needed by mouth for cough.   Yes [provider]  HUMALOG 100 UNIT/ML injection Inject 2-8 Units into the skin See admin instructions. Inject 2-8 units under the skin three times daily before meals, per sliding scale. 08/27/16  Yes [provider]    LANTUS 100 UNIT/ML injection Inject 16 Units into the skin at bedtime.  08/23/16  Yes [provider]  magnesium hydroxide (MILK OF MAGNESIA) 400 MG/5ML suspension Take 30 mLs by mouth daily as needed (for constipation.).   Yes [provider]  mirtazapine (REMERON) 15 MG tablet Take 15 mg by mouth at bedtime. 08/23/16  Yes [provider]  Multiple Vitamin (MULTIVITAMIN WITH MINERALS) TABS tablet Take 1 tablet by mouth daily.   Yes [provider]  neomycin-bacitracin-polymyxin (NEOSPORIN) [phone] ointment Apply 1 application topically at bedtime.   Yes [provider]  omeprazole (PRILOSEC) 20 MG capsule Take 20 mg by mouth at bedtime.  07/25/16  Yes [provider]  polyethylene glycol (MIRALAX / GLYCOLAX) packet Take 17 g by mouth daily as needed (for constipation.).    Yes [provider]  rosuvastatin (CRESTOR) 20 MG tablet Take 20 mg by mouth daily.   Yes [provider]  tamsulosin (FLOMAX) 0.4  MG CAPS capsule Take 0.4 mg by mouth at bedtime.  03/25/17  Yes [provider]  B-D INS SYR ULTRAFINE 1CC/31G 31G X 5/16" 1 ML MISC  10/12/16   [provider]  clotrimazole-betamethasone (LOTRISONE) cream Apply 1 application topically 2 (two) times daily.    [provider]  Lancets (FREESTYLE) lancets  05/01/17   [provider]  lidocaine-prilocaine (EMLA) cream Apply 1 application topically as needed. Patient not taking: Reported on 01/09/2018 11/09/16   Curt Bears, MD  prochlorperazine (COMPAZINE) 10 MG tablet Take 1 tablet (10 mg total) by mouth every 6 (six) hours as needed for nausea or vomiting. Patient not taking: Reported on 11/10/2017 11/09/16   Curt Bears, MD  TRELEGY ELLIPTA 100-62.5-25 MCG/INH AEPB Inhale 1 puff into the lungs daily. 10/05/17   [provider]  triamcinolone (KENALOG) 0.025 % ointment Apply 1 application 2 (two) times daily topically. Patient not  taking: Reported on 11/10/2017 12/20/16   Maryanna Shape, NP    Family History Family History  Problem Relation Age of Onset  . Cancer Brother        lung to head and neck involving lymph nodes  . Cancer Paternal Aunt        breast  . Cancer Paternal Uncle        leukemia  . Cancer Paternal Aunt        breast  . Cancer Paternal Uncle        lung    Social History Social History   Tobacco Use  . Smoking status: Former Smoker    Packs/day: 2.00    Years: 35.00    Pack years: 70.00    Types: Cigarettes    Last attempt to quit: 02/07/1985    Years since quitting: 32.9  . Smokeless tobacco: Former Systems developer  . Tobacco comment: 10/13/16- Quit 31 years ago  Substance Use Topics  . Alcohol use: No  . Drug use: No     Allergies   Ciprofloxacin   Review of Systems Review of Systems  Constitutional: Positive for chills and diaphoresis.  HENT: Positive for rhinorrhea (chronic, unchanged). Negative for congestion, ear pain and sore throat.   Eyes: Negative for visual disturbance.  Respiratory: Positive for cough and shortness of breath.   Cardiovascular: Positive for leg swelling (chronic, unchanged). Negative for chest pain and palpitations.  Gastrointestinal: Negative for abdominal pain, constipation, diarrhea, nausea and vomiting.  Genitourinary: Negative for dysuria and hematuria.  Musculoskeletal: Negative for back pain.  Skin: Negative for color change and rash.  Neurological: Negative for headaches.  All other systems reviewed and are negative.    Physical Exam Updated Vital Signs BP (!) 167/74 (BP Location: Left Arm)   Pulse (!) 59   Temp 97.7 F (36.5 C) (Oral)   Resp (!) 27   Ht 6' (1.829 m)   Wt 66.7 kg   SpO2 92%   BMI 19.94 kg/m   Physical Exam  Constitutional: He appears well-developed and well-nourished.  HENT:  Head: Normocephalic and atraumatic.  Mucous membranes dry  Eyes: Conjunctivae are normal.  Neck: Neck supple.  Cardiovascular: Normal  rate, regular rhythm, normal heart sounds and intact distal pulses.  No murmur heard. Port noted to right chest  Pulmonary/Chest: Effort normal.  Mildly tachypneic, satting at 94-95% on monitor on 3L, speaking in full sentences. Coarse lung sounds noted bilaterally. Diminished breath sounds on the left. Left pleurex catheter in place.  Abdominal: Soft. Bowel sounds are normal. He exhibits  no distension. There is no tenderness.  Musculoskeletal: He exhibits no edema.       Right lower leg: Normal. He exhibits no tenderness and no edema.       Left lower leg: Normal. He exhibits no tenderness and no edema.  Left ankle swelling, chronic  Neurological: He is alert.  Skin: Skin is warm and dry.  Psychiatric: He has a normal mood and affect.  Nursing note and vitals reviewed.   ED Treatments / Results  Labs (all labs ordered are listed, but only abnormal results are displayed) Labs Reviewed  CBC WITH DIFFERENTIAL/PLATELET - Abnormal; Notable for the following components:      Result Value   WBC 10.7 (*)    RBC 3.17 (*)    Hemoglobin 9.4 (*)    HCT 30.7 (*)    RDW 15.6 (*)    Neutro Abs 9.2 (*)    Lymphs Abs 0.4 (*)    All other components within normal limits  BASIC METABOLIC PANEL - Abnormal; Notable for the following components:   Glucose, Bld 212 (*)    Calcium 8.4 (*)    All other components within normal limits  I-STAT TROPONIN, ED    EKG EKG Interpretation  Date/Time:  Tuesday January 09 2018 16:31:11 EST Ventricular Rate:  70 PR Interval:    QRS Duration: 87 QT Interval:  394 QTC Calculation: 426 R Axis:   54 Text Interpretation:  Sinus rhythm Nonspecific T abnormalities, lateral leads Baseline wander in lead(s) V3 flipped t wave seen on prior but  more pronounced  in I, aVL Otherwise no significant change Confirmed by Deno Etienne [phone]) on 01/09/2018 4:41:38 PM   Radiology Dg Chest 2 View  Result Date: 01/09/2018 CLINICAL DATA:  History of left lung cancer.   Cough. EXAM: CHEST - 2 VIEW COMPARISON:  CT scan December 08, 2017 FINDINGS: There is a small right pleural effusion which is similar in the interval. Opacity under the effusion is likely atelectasis. Opacity in the left perihilar region is at the site of the patient's treated malignancy. Hazy opacity is seen in the lower left chest with a left-sided effusion also noted. No pneumothorax. Stable right Port-A-Cath. No other acute interval changes. IMPRESSION: 1. Bilateral pleural effusions, left greater than right. 2. Opacity in left perihilar region is at least partially explained by the patient's known treated malignancy. 3. Hazy opacity more inferiorly in the left lung may represent layering effusion and underlying opacity/atelectasis. Electronically Signed   By: Dorise Bullion III M.D   On: 01/09/2018 17:13    Procedures Procedures (including critical care time)  Medications Ordered in ED Medications  ipratropium-albuterol (DUONEB) 0.5-2.5 (3) MG/3ML nebulizer solution 3 mL (3 mLs Nebulization Given 01/09/18 1713)     Initial Impression / Assessment and Plan / ED Course  I have reviewed the triage vital signs and the nursing notes.  Pertinent labs & imaging results that were available during my care of the patient were reviewed by me and considered in my medical decision making (see chart for details).     Final Clinical Impressions(s) / ED Diagnoses   Final diagnoses:  Shortness of breath  Pleural effusion, bilateral  Acute on chronic respiratory failure, unspecified whether with hypoxia or hypercapnia (HCC)   Pt with a history of left-sided squamous cell lung cancer on chronic 3 L of O2 at home presented to the ED today with worsening shortness of breath for the last several days and a productive cough for the  last several weeks.  No rhinorrhea nasal congestion or other URI symptoms.  No chest pain or shortness of breath.  No pain or on inspiration.  No lower extremity swelling.  No  fevers at home.  States his Pleurx catheter has not been draining fluid for the last several weeks.  On exam has coarse breath sounds bilaterally with decreased breath sounds, more so on the left.  No significant lower extremity swelling.  Cardiac exam is benign.  Labs with slight leukocytosis, stable anemia. BMP reassuring. Trop negative.   EKG with normal sinus rhythm, nonspecific T abnormalities, lateral leads Baseline wander in lead(s) V3 flipped t wave seen on prior but  more pronounced  in I, aVL Otherwise no significant change.  CXR  with bilateral pleural effusions, left greater than right. Opacity in left perihilar region is likely secondary to known malignancy. Hazy opacity more inferiorly in the left lung may represent layering effusion and underlying opacity/atelectasis.  With patients worsening bilat pleural effusions, increased oxygen requirement, and pleurivac that is currently not draining, patient will likely require admission for further treatment and possible evaluation of his pleurivac.  CONSULT with Dr. Hal Hope who accepts pt for admission.  ED Discharge Orders    None       Bishop Dublin 01/09/18 2101    Deno Etienne, DO 01/09/18 2103

## 2018-01-09 NOTE — Telephone Encounter (Signed)
Called patient to ask not to come today, due to not having his MRI, MRI is scheduled for 12-12- 19, fu rescheduled for 01-19-18 @ 2:30 pm for results, spoke with patient's wife- Tana Conch and she is aware of these appt. changes and she is good with them

## 2018-01-09 NOTE — ED Triage Notes (Signed)
Pt reports SOB since yesterday. Is on continuous O2 at 3L via nasal canula. Had cough with productive yellow sputum for couple weeks. Hx left lung cancer Reports clammy sweats at night time.

## 2018-01-10 ENCOUNTER — Inpatient Hospital Stay (HOSPITAL_COMMUNITY): Payer: Medicare Other

## 2018-01-10 DIAGNOSIS — J918 Pleural effusion in other conditions classified elsewhere: Secondary | ICD-10-CM | POA: Diagnosis present

## 2018-01-10 DIAGNOSIS — J9601 Acute respiratory failure with hypoxia: Secondary | ICD-10-CM | POA: Diagnosis not present

## 2018-01-10 DIAGNOSIS — Z806 Family history of leukemia: Secondary | ICD-10-CM | POA: Diagnosis not present

## 2018-01-10 DIAGNOSIS — E119 Type 2 diabetes mellitus without complications: Secondary | ICD-10-CM | POA: Diagnosis present

## 2018-01-10 DIAGNOSIS — Z79899 Other long term (current) drug therapy: Secondary | ICD-10-CM | POA: Diagnosis not present

## 2018-01-10 DIAGNOSIS — E785 Hyperlipidemia, unspecified: Secondary | ICD-10-CM | POA: Diagnosis present

## 2018-01-10 DIAGNOSIS — J8410 Pulmonary fibrosis, unspecified: Secondary | ICD-10-CM | POA: Diagnosis present

## 2018-01-10 DIAGNOSIS — Z515 Encounter for palliative care: Secondary | ICD-10-CM | POA: Diagnosis not present

## 2018-01-10 DIAGNOSIS — Y842 Radiological procedure and radiotherapy as the cause of abnormal reaction of the patient, or of later complication, without mention of misadventure at the time of the procedure: Secondary | ICD-10-CM | POA: Diagnosis present

## 2018-01-10 DIAGNOSIS — E1169 Type 2 diabetes mellitus with other specified complication: Secondary | ICD-10-CM | POA: Diagnosis not present

## 2018-01-10 DIAGNOSIS — Z7189 Other specified counseling: Secondary | ICD-10-CM | POA: Diagnosis not present

## 2018-01-10 DIAGNOSIS — K219 Gastro-esophageal reflux disease without esophagitis: Secondary | ICD-10-CM | POA: Diagnosis present

## 2018-01-10 DIAGNOSIS — J189 Pneumonia, unspecified organism: Secondary | ICD-10-CM | POA: Diagnosis present

## 2018-01-10 DIAGNOSIS — J9811 Atelectasis: Secondary | ICD-10-CM | POA: Diagnosis present

## 2018-01-10 DIAGNOSIS — Y95 Nosocomial condition: Secondary | ICD-10-CM | POA: Diagnosis present

## 2018-01-10 DIAGNOSIS — I252 Old myocardial infarction: Secondary | ICD-10-CM | POA: Diagnosis not present

## 2018-01-10 DIAGNOSIS — R0602 Shortness of breath: Secondary | ICD-10-CM | POA: Diagnosis present

## 2018-01-10 DIAGNOSIS — Z87891 Personal history of nicotine dependence: Secondary | ICD-10-CM | POA: Diagnosis not present

## 2018-01-10 DIAGNOSIS — G9341 Metabolic encephalopathy: Secondary | ICD-10-CM | POA: Diagnosis present

## 2018-01-10 DIAGNOSIS — C3492 Malignant neoplasm of unspecified part of left bronchus or lung: Secondary | ICD-10-CM

## 2018-01-10 DIAGNOSIS — Z66 Do not resuscitate: Secondary | ICD-10-CM | POA: Diagnosis present

## 2018-01-10 DIAGNOSIS — I10 Essential (primary) hypertension: Secondary | ICD-10-CM | POA: Diagnosis present

## 2018-01-10 DIAGNOSIS — D638 Anemia in other chronic diseases classified elsewhere: Secondary | ICD-10-CM | POA: Diagnosis present

## 2018-01-10 DIAGNOSIS — C7931 Secondary malignant neoplasm of brain: Secondary | ICD-10-CM | POA: Diagnosis present

## 2018-01-10 DIAGNOSIS — J90 Pleural effusion, not elsewhere classified: Secondary | ICD-10-CM | POA: Diagnosis not present

## 2018-01-10 DIAGNOSIS — J440 Chronic obstructive pulmonary disease with acute lower respiratory infection: Secondary | ICD-10-CM | POA: Diagnosis present

## 2018-01-10 DIAGNOSIS — I2510 Atherosclerotic heart disease of native coronary artery without angina pectoris: Secondary | ICD-10-CM | POA: Diagnosis present

## 2018-01-10 DIAGNOSIS — H3530 Unspecified macular degeneration: Secondary | ICD-10-CM | POA: Diagnosis present

## 2018-01-10 DIAGNOSIS — J9621 Acute and chronic respiratory failure with hypoxia: Secondary | ICD-10-CM | POA: Diagnosis present

## 2018-01-10 DIAGNOSIS — Z881 Allergy status to other antibiotic agents status: Secondary | ICD-10-CM | POA: Diagnosis not present

## 2018-01-10 DIAGNOSIS — N400 Enlarged prostate without lower urinary tract symptoms: Secondary | ICD-10-CM | POA: Diagnosis present

## 2018-01-10 DIAGNOSIS — T380X5A Adverse effect of glucocorticoids and synthetic analogues, initial encounter: Secondary | ICD-10-CM | POA: Diagnosis present

## 2018-01-10 LAB — BLOOD GAS, ARTERIAL
Acid-Base Excess: 5.4 mmol/L — ABNORMAL HIGH (ref 0.0–2.0)
Bicarbonate: 28.5 mmol/L — ABNORMAL HIGH (ref 20.0–28.0)
Drawn by: 331471
FIO2: 100
O2 Saturation: 92.5 %
PH ART: 7.496 — AB (ref 7.350–7.450)
Patient temperature: 98.6
pCO2 arterial: 37.2 mmHg (ref 32.0–48.0)
pO2, Arterial: 63.9 mmHg — ABNORMAL LOW (ref 83.0–108.0)

## 2018-01-10 LAB — BASIC METABOLIC PANEL
Anion gap: 10 (ref 5–15)
Anion gap: 10 (ref 5–15)
BUN: 19 mg/dL (ref 8–23)
BUN: 19 mg/dL (ref 8–23)
CO2: 29 mmol/L (ref 22–32)
CO2: 28 mmol/L (ref 22–32)
CREATININE: 0.79 mg/dL (ref 0.61–1.24)
Calcium: 8.4 mg/dL — ABNORMAL LOW (ref 8.9–10.3)
Calcium: 8.3 mg/dL — ABNORMAL LOW (ref 8.9–10.3)
Chloride: 101 mmol/L (ref 98–111)
Chloride: 98 mmol/L (ref 98–111)
Creatinine, Ser: 0.69 mg/dL (ref 0.61–1.24)
GFR calc Af Amer: >60 mL/min (ref >60)
GFR calc Af Amer: >60 mL/min (ref >60)
GFR calc non Af Amer: >60 mL/min (ref >60)
GFR calc non Af Amer: >60 mL/min (ref >60)
Glucose, Bld: 180 mg/dL — ABNORMAL HIGH (ref 70–99)
Glucose, Bld: 227 mg/dL — ABNORMAL HIGH (ref 70–99)
Potassium: 3.8 mmol/L (ref 3.5–5.1)
Potassium: 4 mmol/L (ref 3.5–5.1)
SODIUM: 136 mmol/L (ref 135–145)
Sodium: 140 mmol/L (ref 135–145)

## 2018-01-10 LAB — CBC
HCT: 31.8 % — ABNORMAL LOW (ref 39.0–52.0)
HCT: 32.6 % — ABNORMAL LOW (ref 39.0–52.0)
Hemoglobin: 9.7 g/dL — ABNORMAL LOW (ref 13.0–17.0)
Hemoglobin: 10.1 g/dL — ABNORMAL LOW (ref 13.0–17.0)
MCH: 29.6 pg (ref 26.0–34.0)
MCH: 29.2 pg (ref 26.0–34.0)
MCHC: 30.5 g/dL (ref 30.0–36.0)
MCHC: 31 g/dL (ref 30.0–36.0)
MCV: 97 fL (ref 80.0–100.0)
MCV: 94.2 fL (ref 80.0–100.0)
NRBC: 0 % (ref 0.0–0.2)
Platelets: 202 10*3/uL (ref 150–400)
Platelets: 221 10*3/uL (ref 150–400)
RBC: 3.28 MIL/uL — ABNORMAL LOW (ref 4.22–5.81)
RBC: 3.46 MIL/uL — AB (ref 4.22–5.81)
RDW: 15.7 % — ABNORMAL HIGH (ref 11.5–15.5)
RDW: 16 % — ABNORMAL HIGH (ref 11.5–15.5)
WBC: 10.9 10*3/uL — ABNORMAL HIGH (ref 4.0–10.5)
WBC: 13.6 10*3/uL — ABNORMAL HIGH (ref 4.0–10.5)
nRBC: 0 % (ref 0.0–0.2)

## 2018-01-10 LAB — GLUCOSE, CAPILLARY
Glucose-Capillary: 117 mg/dL — ABNORMAL HIGH (ref 70–99)
Glucose-Capillary: 167 mg/dL — ABNORMAL HIGH (ref 70–99)
Glucose-Capillary: 223 mg/dL — ABNORMAL HIGH (ref 70–99)
Glucose-Capillary: 231 mg/dL — ABNORMAL HIGH (ref 70–99)
Glucose-Capillary: 258 mg/dL — ABNORMAL HIGH (ref 70–99)

## 2018-01-10 LAB — LACTIC ACID, PLASMA: Lactic Acid, Venous: 1 mmol/L (ref 0.5–1.9)

## 2018-01-10 LAB — STREP PNEUMONIAE URINARY ANTIGEN: Strep Pneumo Urinary Antigen: NEGATIVE

## 2018-01-10 MED ORDER — LORAZEPAM 2 MG/ML IJ SOLN
0.5000 mg | Freq: Once | INTRAMUSCULAR | Status: AC
  Administered 2018-01-10: 0.5 mg via INTRAVENOUS

## 2018-01-10 MED ORDER — AZITHROMYCIN 250 MG PO TABS
500.0000 mg | ORAL_TABLET | ORAL | Status: DC
  Administered 2018-01-12 – 2018-01-16 (×5): 500 mg via ORAL
  Filled 2018-01-10 (×5): qty 2

## 2018-01-10 MED ORDER — LACTATED RINGERS IV BOLUS: 30.0000 mL/kg | Freq: Once | INTRAVENOUS | Status: DC

## 2018-01-10 MED ORDER — VANCOMYCIN HCL 10 G IV SOLR
1250.0000 mg | INTRAVENOUS | Status: DC
  Filled 2018-01-10: qty 1250

## 2018-01-10 MED ORDER — SODIUM CHLORIDE 0.9 % IV SOLN: 1.0000 g | INTRAVENOUS | Status: DC

## 2018-01-10 MED ORDER — LORAZEPAM 2 MG/ML IJ SOLN: 0.2500 mg | Freq: Once | INTRAMUSCULAR | Status: DC | PRN

## 2018-01-10 MED ORDER — ENSURE ENLIVE PO LIQD
237.0000 mL | Freq: Two times a day (BID) | ORAL | Status: DC
  Administered 2018-01-10 – 2018-01-16 (×10): 237 mL via ORAL

## 2018-01-10 MED ORDER — INSULIN ASPART 100 UNIT/ML ~~LOC~~ SOLN
0.0000 [IU] | SUBCUTANEOUS | Status: DC
  Administered 2018-01-10: 5 [IU] via SUBCUTANEOUS
  Administered 2018-01-11 (×3): 2 [IU] via SUBCUTANEOUS
  Administered 2018-01-11: 1 [IU] via SUBCUTANEOUS
  Administered 2018-01-11: 2 [IU] via SUBCUTANEOUS
  Administered 2018-01-11: 1 [IU] via SUBCUTANEOUS
  Administered 2018-01-12: 9 [IU] via SUBCUTANEOUS
  Administered 2018-01-12: 2 [IU] via SUBCUTANEOUS
  Administered 2018-01-12: 7 [IU] via SUBCUTANEOUS
  Administered 2018-01-12: 2 [IU] via SUBCUTANEOUS
  Administered 2018-01-12: 5 [IU] via SUBCUTANEOUS
  Administered 2018-01-12: 2 [IU] via SUBCUTANEOUS
  Administered 2018-01-13: 5 [IU] via SUBCUTANEOUS
  Administered 2018-01-13: 9 [IU] via SUBCUTANEOUS
  Administered 2018-01-13: 5 [IU] via SUBCUTANEOUS
  Administered 2018-01-14: 1 [IU] via SUBCUTANEOUS
  Administered 2018-01-14 (×2): 2 [IU] via SUBCUTANEOUS

## 2018-01-10 MED ORDER — LORAZEPAM 2 MG/ML IJ SOLN
INTRAMUSCULAR | Status: AC
  Administered 2018-01-10: 0.5 mg via INTRAVENOUS
  Filled 2018-01-10: qty 1

## 2018-01-10 MED ORDER — VANCOMYCIN HCL 10 G IV SOLR
1250.0000 mg | Freq: Once | INTRAVENOUS | Status: AC
  Administered 2018-01-10: 1250 mg via INTRAVENOUS
  Filled 2018-01-10: qty 1250

## 2018-01-10 MED ORDER — AZITHROMYCIN 250 MG PO TABS: 500.0000 mg | ORAL_TABLET | ORAL | Status: DC

## 2018-01-10 MED ORDER — SODIUM CHLORIDE 0.9 % IV SOLN
1.0000 g | INTRAVENOUS | Status: DC
  Filled 2018-01-10: qty 10

## 2018-01-10 MED ORDER — IPRATROPIUM-ALBUTEROL 0.5-2.5 (3) MG/3ML IN SOLN
3.0000 mL | Freq: Four times a day (QID) | RESPIRATORY_TRACT | Status: DC
  Administered 2018-01-10 – 2018-01-13 (×13): 3 mL via RESPIRATORY_TRACT
  Filled 2018-01-10 (×14): qty 3

## 2018-01-10 MED ORDER — SODIUM CHLORIDE 0.9 % IV SOLN
1.0000 g | Freq: Three times a day (TID) | INTRAVENOUS | Status: DC
  Administered 2018-01-11 – 2018-01-17 (×20): 1 g via INTRAVENOUS
  Filled 2018-01-10 (×23): qty 1

## 2018-01-10 MED ORDER — METHYLPREDNISOLONE SODIUM SUCC 125 MG IJ SOLR
60.0000 mg | Freq: Four times a day (QID) | INTRAMUSCULAR | Status: DC
  Administered 2018-01-10 – 2018-01-12 (×7): 60 mg via INTRAVENOUS
  Filled 2018-01-10 (×7): qty 2

## 2018-01-10 MED ORDER — SODIUM CHLORIDE 0.9 % IV SOLN
2.0000 g | INTRAVENOUS | Status: AC
  Administered 2018-01-10: 2 g via INTRAVENOUS
  Filled 2018-01-10: qty 2

## 2018-01-10 MED ORDER — LEVOFLOXACIN IN D5W 750 MG/150ML IV SOLN
750.0000 mg | Freq: Every day | INTRAVENOUS | Status: DC
  Administered 2018-01-10: 750 mg via INTRAVENOUS
  Filled 2018-01-10: qty 150

## 2018-01-10 NOTE — Progress Notes (Signed)
Patient ID: REGINOLD BEALE, male   DOB: May 31, 1931, 84 y.o.   MRN: [id number] Asked to assess patient's left Pleurx catheter which was placed on 10/13/2017 at [facility].  There has been minimal output from the Pleurx for the past few weeks per the patient's wife.  On exam Pleurx catheter is intact ; there is a small amount of erythema at insertion site along with small amount of serous fluid on overlying gauze pad.  Catheter can be irrigated but minimal fluid return is encountered with attachment of catheter to suction canister.  Case discussed with Dr. Annamaria Boots and will plan follow-up CT chest to evaluate effusion amount/cath location. Above d/w Dr. Florene Glen.

## 2018-01-10 NOTE — Consult Note (Signed)
PULMONARY / CRITICAL CARE MEDICINE   NAME:  Steve Frank, MRN:  [id number], DOB:  01-11-1932, LOS: 0 ADMISSION DATE:  01/09/2018, CONSULTATION DATE: 01/10/2018 REFERRING MD: Dr. Florene Glen, CHIEF COMPLAINT: Shortness of breath  BRIEF HISTORY:    84 year old male with metastatic non-small cell lung cancer bilateral pleural effusion coming in with healthcare associate pneumonia and worsening fibrosis of his left lung suspecting related to radiation.  HISTORY OF PRESENT ILLNESS   Mr. Calica is an 84 year old male with history of COPD and metastatic non-small cell lung cancer with left-sided Pleurx catheter coming in with progressive shortness of breath the last 2 weeks he was taken for CT scan tonight and on arrival to the floor patient was in much worsening shortness of breath he had to be transferred to the intensive care unit for further management.  Patient was desaturating he was in severe respiratory distress I was called to assess the patient I cannot assess him at bedside he just received Ativan so he was not able to participate in his history but he is on BiPAP 100% FiO2.  CT of the chest showed worsening left-sided consolidation and fibrosis with some minimal left pleural effusion and moderate pleural effusion on the right side.  Patient is having fever also and is being treated for pneumonia.  SIGNIFICANT PAST MEDICAL HISTORY   -COPD -CAD status post CABG -Metastatic non-small cell lung cancer status post radiation -Left Pleurx catheter  SIGNIFICANT EVENTS:   STUDIES:   CT chest 1. Large consolidation within the LEFT lung, involving all lobes but centered at the LEFT lung base, significantly increased in size/extent compared to the previous exam, compatible with worsening pneumonia, alternatively severe post radiation change. As discussed on the previous chest CT report, some component of lymphangitic spread of tumor cannot be excluded. 2. RIGHT pleural effusion, moderate to large in  size, slightly increased in size compared to the previous exam, with associated atelectasis. 3. Small loculated appearing pleural effusion along the LEFT lateral chest wall, new compared to the previous exam. Pleural drain extends to the posterior aspect of the LEFT lung base. 4. Emphysematous changes, mild to moderate in degree, upper lobe predominant. 5. Stable compression fracture deformity of the T6 vertebral body. 6. Healing/healed fracture of the LEFT eleventh posterior rib. CULTURES:    ANTIBIOTICS:  Cefepime Vancomycin Azithromycin  LINES/TUBES:    CONSULTANTS:    SUBJECTIVE:    CONSTITUTIONAL: BP (!) 160/76 (BP Location: Left Arm)   Pulse (!) 101   Temp (!) 103.5 F (39.7 C) (Rectal)   Resp (!) 43   Ht 6' (1.829 m)   Wt 64.4 kg   SpO2 95%   BMI 19.26 kg/m   I/O last 3 completed shifts: In: 700 [P.O.:700] Out: 800 [Urine:800]     FiO2 (%):  [100 %] 100 %  PHYSICAL EXAM: General: Acutely ill on BiPAP in moderate distress lethargic just received some Ativan  neuro: Lethargic moving all extremities not following commands HEENT: Dry mucous membranes Cardiovascular: Normal heart sound no sounds or murmurs Lungs: Using accessory muscles in moderate distress coarse crackles on the left side decreased air sound in the right side Abdomen: Soft no tenderness no organomegaly Musculoskeletal: No edema Skin: No rash  RESOLVED PROBLEM LIST   ASSESSMENT AND PLAN   Assessment -Acute on chronic hypoxemic respiratory failure requiring noninvasive mechanical ventilation -Healthcare associated pneumonia -Radiation pneumonitis -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  Discussed with primary hospitalist and agreed to the plan he will discuss that with family. -Might benefit from right thoracentesis -We appreciate the consult we will continue following  I have spent 40 minutes of critical care time this time was spent bedside or in the unit this time was exclusive any billable procedures patient is needing intensive care due to acute respiratory failure requiring noninvasive mechanical ventilation  SUMMARY OF TODAY'S PLAN:    Best Practice / Goals of Care / Disposition.   DVT PROPHYLAXIS:  SUP: PPI NUTRITION: N.p.o. MOBILITY: Bedrest GOALS OF CARE: Full code FAMILY DISCUSSIONS: Waiting on family to arrive DISPOSITION ICU  LABS  Glucose Recent Labs  Lab 01/10/18 0757 01/10/18 1126 01/10/18 1709  GLUCAP 167* 223* 117*    BMET Recent Labs  Lab 01/09/18 1648 01/10/18 0316  NA 139 140  K 4.1 3.8  CL 101 101  CO2 29 29  BUN 23 19  CREATININE 0.65 0.69  GLUCOSE 212* 180*    Liver Enzymes No results for input(s): AST, ALT, ALKPHOS, BILITOT, ALBUMIN in the last 168 hours.  Electrolytes Recent Labs  Lab 01/09/18 1648 01/10/18 0316  CALCIUM 8.4* 8.4*    CBC Recent Labs  Lab 01/09/18 1648 01/10/18 0316  WBC 10.7* 10.9*  HGB 9.4* 9.7*  HCT 30.7* 31.8*  PLT 218 202    ABG Recent Labs  Lab 01/10/18 1845  PHART 7.496*  PCO2ART 37.2  PO2ART 63.9*    Coag's No results for input(s): APTT, INR in the last 168 hours.  Sepsis Markers No results for input(s): LATICACIDVEN, PROCALCITON, O2SATVEN in the last 168 hours.  Cardiac Enzymes No results for input(s): TROPONINI, PROBNP in the last 168 hours.  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  PAST SURGICAL HISTORY:  He  has a past surgical history that includes Flexible bronchoscopy (08/07/2016); Craniectomy for excision of brain tumor infratentorial / posterior fossa (08/05/2016); Coronary artery bypass graft; Coronary artery bypass graft; Colonoscopy w/ polypectomy; Cardiac catheterization; Video bronchoscopy with endobronchial ultrasound (N/A, 10/14/2016); Lung biopsy (N/A, 10/14/2016); IR FLUORO GUIDE PORT INSERTION RIGHT (11/18/2016); IR US Guide Vasc Access Right (11/18/2016); and IR THORACENTESIS ASP PLEURAL SPACE W/IMG GUIDE (03/09/2017).  Allergies  Allergen Reactions  . Ciprofloxacin Nausea And Vomiting    No current facility-administered medications on file prior to encounter.    Current Outpatient Medications on File Prior to Encounter  Medication Sig  . acetaminophen (TYLENOL) 325 MG tablet Take 650 mg by mouth every 6 (six) hours as needed for mild pain, fever or headache (for pain/headaches.).   Marland Kitchen albuterol (PROVENTIL HFA;VENTOLIN HFA) 108 (90 Base) MCG/ACT inhaler Inhale 2 puffs into the lungs every 6 (six) hours as needed (for wheezing/shortness of breath).   . Cholecalciferol (VITAMIN D-1000 MAX ST) 1000 units tablet Take 1,000 Units by mouth daily.   Marland Kitchen  docusate sodium (COLACE) 100 MG capsule Take 100 mg by mouth 2 (two) times daily as needed (for constipation.).  Marland Kitchen gabapentin (NEURONTIN) 400 MG capsule Take 400 mg by mouth at bedtime.  Marland Kitchen guaiFENesin-dextromethorphan (ROBITUSSIN DM) 100-10 MG/5ML syrup Take 15 mLs every 4 (four) hours as needed by mouth for cough.  Marland Kitchen HUMALOG 100 UNIT/ML injection Inject 2-8 Units into the skin See admin instructions. Inject 2-8 units under the skin three times daily before  meals, per sliding scale.  Marland Kitchen LANTUS 100 UNIT/ML injection Inject 16 Units into the skin at bedtime.   . magnesium hydroxide (MILK OF MAGNESIA) 400 MG/5ML suspension Take 30 mLs by mouth daily as needed (for constipation.).  Marland Kitchen mirtazapine (REMERON) 15 MG tablet Take 15 mg by mouth at bedtime.  . Multiple Vitamin (MULTIVITAMIN WITH MINERALS) TABS tablet Take 1 tablet by mouth daily.  Marland Kitchen neomycin-bacitracin-polymyxin (NEOSPORIN) [phone] ointment Apply 1 application topically at bedtime.  Marland Kitchen omeprazole (PRILOSEC) 20 MG capsule Take 20 mg by mouth at bedtime.   . polyethylene glycol (MIRALAX / GLYCOLAX) packet Take 17 g by mouth daily as needed (for constipation.).   Marland Kitchen rosuvastatin (CRESTOR) 20 MG tablet Take 20 mg by mouth daily.  . tamsulosin (FLOMAX) 0.4 MG CAPS capsule Take 0.4 mg by mouth at bedtime.   . B-D INS SYR ULTRAFINE 1CC/31G 31G X 5/16" 1 ML MISC   . clotrimazole-betamethasone (LOTRISONE) cream Apply 1 application topically 2 (two) times daily.  . Lancets (FREESTYLE) lancets   . lidocaine-prilocaine (EMLA) cream Apply 1 application topically as needed. (Patient not taking: Reported on 01/09/2018)  . prochlorperazine (COMPAZINE) 10 MG tablet Take 1 tablet (10 mg total) by mouth every 6 (six) hours as needed for nausea or vomiting. (Patient not taking: Reported on 11/10/2017)  . TRELEGY ELLIPTA 100-62.5-25 MCG/INH AEPB Inhale 1 puff into the lungs daily.  Marland Kitchen triamcinolone (KENALOG) 0.025 % ointment Apply 1 application 2 (two) times daily topically. (Patient not taking: Reported on 11/10/2017)    FAMILY HISTORY:   His family history includes Cancer in his brother, paternal aunt, paternal aunt, paternal uncle, and paternal uncle. Could not be obtained due to patient altered mental status SOCIAL HISTORY:  He  reports that he quit smoking about 32 years ago. His smoking use included cigarettes. He has a 70.00 pack-year smoking history. He has quit using smokeless tobacco. He reports that he  does not drink alcohol or use drugs. Could not be obtained due to patient altered mental status  REVIEW OF SYSTEMS:    Could not be obtained due to patient altered mental status

## 2018-01-10 NOTE — Progress Notes (Signed)
Pharmacy Antibiotic Note  Steve Frank is a 84 y.o. male admitted on 01/09/2018 with pneumonia.  Pharmacy has been consulted for levofloxacin dosing.  Plan: Levofloxacin 750mg  iv q24hr  Levofloxacin dosed based on patient weight and renal function     Height: 6' (182.9 cm) Weight: 147 lb (66.7 kg) IBW/kg (Calculated) : 77.6  Temp (24hrs), Avg:98 F (36.7 C), Min:97.7 F (36.5 C), Max:98.2 F (36.8 C)  Recent Labs  Lab 01/09/18 1648 01/10/18 0316  WBC 10.7* 10.9*  CREATININE 0.65 0.69    Estimated Creatinine Clearance: 64.8 mL/min (by C-G formula based on SCr of 0.69 mg/dL).    Allergies  Allergen Reactions  . Ciprofloxacin Nausea And Vomiting    Antimicrobials this admission: Levofloxacin 01/10/2018 >>  Dose adjustments this admission: -  Microbiology results: -  Thank you for allowing pharmacy to be a part of this patient's care.  Nani Skillern Crowford 01/10/2018 6:01 AM

## 2018-01-10 NOTE — Progress Notes (Signed)
Initial Nutrition Assessment  DOCUMENTATION CODES:   (Will assess for malnutrition at follow-up)  INTERVENTION:  - Continue Ensure Enlive BID, each supplement provides 350 kcal and 20 grams of protein. - Continue to encourage PO intakes. - Will attempt NFPE and obtain PTA information at follow-up.   NUTRITION DIAGNOSIS:   Inadequate oral intake related to acute illness as evidenced by meal completion < 50%.  GOAL:   Patient will meet greater than or equal to 90% of their needs  MONITOR:   PO intake, Supplement acceptance, Weight trends, Labs, I & O's  REASON FOR ASSESSMENT:   Malnutrition Screening Tool  ASSESSMENT:   84 y.o. male with history of metastatic non-small cell lung cancer, CAD s/p CABG, and COPD. Patient presented with 2 weeks of worsening SOB. Oncology notes indicate patient has chronic SOB 2/2 pleural effusion and radiation pneumonitis. He has L-sided Pleurx which has gradually stopped draining for the last 2 weeks.  Patient also has been having some productive cough but denies any fever or chills.  BMI indicates normal weight. Per chart review, patient consumed 25% of breakfast and reported it was cold. Ensure was ordered per ONS protocol this AM and review of order shows patient accepted the bottle offered to him this AM.   IR in patient's room for evaluation; unable to talk with patient or his wife at this time.  Patient was seen by [facility] RD on 02/20/17 and has not seen a RD since that time. Per chart review, current weight is 144 lb and he weighed 147 lb on 11/5. This indicates 3 lb weight loss (2% body weight) in 1 month; not significant for time frame.   Medications reviewed; 1000 units cholecalciferol/day, sliding scale Novolog, 16 units Lantus/day, daily multivitamin with minerals. Labs reviewed; CBGs: 167 and 223 mg/dL today, Ca: 8.4 mg/dL.      NUTRITION - FOCUSED PHYSICAL EXAM:  Will attempt at follow-up.   Diet Order:   Diet Order           Diet heart healthy/carb modified Room service appropriate? Yes; Fluid consistency: Thin  Diet effective now              EDUCATION NEEDS:   Not appropriate for education at this time  Skin:  Skin Assessment: Reviewed RN Assessment  Last BM:  12/2  Height:   Ht Readings from Last 1 Encounters:  01/09/18 6' (1.829 m)    Weight:   Wt Readings from Last 1 Encounters:  01/10/18 65.5 kg    Ideal Body Weight:  80.91 kg  BMI:  Body mass index is 19.58 kg/m.  Estimated Nutritional Needs:   Kcal:  1830-2030 kcal  Protein:  80-92 grams  Fluid:  >/= 1.5 L/day     Jarome Matin, MS, RD, LDN, [facility] Inpatient Clinical Dietitian Pager # [phone] After hours/weekend pager # [phone]

## 2018-01-10 NOTE — Significant Event (Signed)
Rapid Response Event Note  Overview: Time Called: 6389 Arrival Time: 1855 Event Type: Respiratory Rapid Response called to 1443 by bedside RN. MD Florene Glen at beside. Patient in respiratory distress. Patient would be immediately transferred to ICU.  Initial Focused Assessment: Neuro: Alert, oriented x3-self,place, situation. Able to move all extremities.  Cardiac: HR upper 90s, NSR, s1 and s2 heard upon auscultation. See BP in vitals, HTN Pulmonary: RR 40s-50s, O2 Sats 94% on NRB at 100%, patient utilizing accessory muscles, patient appears to be in distress.  Interventions: Trx to ICU ABG ordered and drawn by Respiratory Therapy  Possible BIPAP   Plan of Care (if not transferred):  Event Summary:   at      at          [facility] C

## 2018-01-10 NOTE — Progress Notes (Signed)
Pharmacy Antibiotic Note  Steve Frank is a 84 y.o. male admitted on 01/09/2018 with pneumonia.  Pharmacy has been consulted for vancomycin and cefepime dosing.  Pt transferred to ICU after rapid response event.  Pt with PMH of COPD and metastatic non-small cell lung cancer with left-sided Pleurx catheter admitted with progressive shortness of breath the last 2 weeks.   Plan:   Vancomycin 1250 mg IV q24h   Cefepime 2 gr IV x1, then 1 gr IV q8h  Azithromycin 500 mg PO q24h  Monitor clinical course, renal function, cultures as available   Height: 6' (182.9 cm) Weight: 141 lb 15.6 oz (64.4 kg) IBW/kg (Calculated) : 77.6  Temp (24hrs), Avg:100 F (37.8 C), Min:98.2 F (36.8 C), Max:103.5 F (39.7 C)  Recent Labs  Lab 01/09/18 1648 01/10/18 0316 01/10/18 1944  WBC 10.7* 10.9* 13.6*  CREATININE 0.65 0.69 0.79  LATICACIDVEN  --   --  1.0    Estimated Creatinine Clearance: 62.6 mL/min (by C-G formula based on SCr of 0.79 mg/dL).    Allergies  Allergen Reactions  . Ciprofloxacin Nausea And Vomiting    Antimicrobials this admission: 12/4 levofloxacin x 1  12/4 vancomycin >>  12/4 cefepime >>  12/5 azithromycin >>    Dose adjustments this admission:   Microbiology results: 12/4 strep pneumo antigen:  12/4  BCx:  12/4 MRSA PCR:   Thank you for allowing pharmacy to be a part of this patient's care.  Royetta Asal, PharmD, BCPS Pager [phone] 01/10/2018 8:49 PM

## 2018-01-10 NOTE — Progress Notes (Addendum)
PROGRESS NOTE    Steve Frank  IEP:[id number] DOB: 1931-10-28 DOA: 01/09/2018 PCP: [name], MD   Brief Narrative:  Steve Frank is a 84 y.o. male with history of metastatic non-small cell lung cancer, CAD status post CABG, COPD presently on Keytruda being followed by Dr. Julien Nordmann, oncologist presents with worsening shortness of breath over the last 2 weeks.  As per the oncology notes patient has chronic shortness of breath secondary to pleural effusion and radiation pneumonitis.  Patient states his shortness of breath is acutely worsened and he has left-sided Pleurx catheter which has stopped draining for last 2 weeks gradually.  Patient also has been having some productive cough but denies any fever or chills.  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  Called by nursing aroun 1830 regarding labored breathing and increased RR for Steve Frank which started after he got back from CT scan.  Vitals notable for fever and hypoxia.  Pt tachypneic, decreased lung sounds at bases.  Uncomfortable on exam.  Mildly tachy.  S/NT/ND.  CT scan with consolidation within L lung as well as R pleural effusion and small loculated effusion along L lateral chest wall. (see report) 1. Rapid response 2. Pt placed on NRB -> Bipap.  ABG. 3. Discussed with pulmonary who will see pt  4. Fever, code sepsis.  Blood cx, lactate, BMP, CBC.  Will hold off on 30 cc/kg bolus for now with hx of diastolic dysfunction and tenuous respiratory status and normal BP's and HR.  5. Updated wife, who is on her way with family.  Pt is currently listed as full code.  She notes they've previously discussed  intubation and ventilator.  She noted that they previously discussed not being placed on ventilator and that he would not want this.  I discussed this with the patient and he stated if it was the only option that he would want this.  I had a long discussion later with the family, at this time, the pt was lethargic and confused.  There was some concern from his wife and daughter that he may have been confused earlier today (his wife notes when they had this discussion before that he was very clear about his desire not to be on a ventilator).  We discussed his CT scan, infection, and hx of metastatic lung cancer and comorbidities.  We discussed that given his comorbidities including his metastatic non small cell, that I thought it was unlikely he would do well with intubation (I'd discussed with pulm who was in agreement).  The family, including Tana Conch (wife - 2 sons and daughter as well as 2 grandchildren present as well), decided to continue bipap and abx, but against intubation.  We discussed if he fails bipap overnight, we will transition to making him comfortable.  Pt is now DNR.     1. Acute on chronic hypoxic respiratory failure with hypoxia likely from worsening pleural effusion on the left (at baseline on 3-3.5 L [place], now on ~6 L [place]): 1. Pleurx catheter not draining 2. CXR shows L>R bilateral effusions  3. Levaquin -> ceftriaxone/azithromycin (no blood cx obtained at admission) 4. IR c/s for pleurx catheter with minimal output -  IR noted minimal fluid return and recommending follow up with CT chest to evaluate effusion amount and cath location 5. R sided effusion as well, described as small and similar in interval.  Continue to monitor, may eventually need drainage 6. Continue to monitor, follow up CT chest  7. Cell count/diff and pleural fluid cx ordered   2. COPD on home inhalers.  Not actively wheezing.  Follow  3. Diabetes mellitus type 2 on Lantus insulin and sliding scale  coverage.  4. Anemia likely from malignancy follow CBC.  5. Metastatic non-small cell lung cancer being followed by Dr. Julien Nordmann.  DVT prophylaxis: SCD Code Status: full  Family Communication: wife at bedside Disposition Plan: pending CT scan as noted above, improvement in respiratory status   Consultants:   IR  Procedures:   none  Antimicrobials:  Anti-infectives (From admission, onward)   Start     Dose/Rate Route Frequency Ordered Stop   01/11/18 1000  azithromycin (ZITHROMAX) tablet 500 mg     500 mg Oral Every 24 hours 01/10/18 1510 01/18/18 0959   01/11/18 1000  cefTRIAXone (ROCEPHIN) 1 g in sodium chloride 0.9 % 100 mL IVPB     1 g 200 mL/hr over 30 Minutes Intravenous Every 24 hours 01/10/18 1516 01/17/18 0959   01/10/18 1600  cefTRIAXone (ROCEPHIN) 1 g in sodium chloride 0.9 % 100 mL IVPB  Status:  Discontinued     1 g 200 mL/hr over 30 Minutes Intravenous Every 24 hours 01/10/18 1510 01/10/18 1516   01/10/18 0615  levofloxacin (LEVAQUIN) IVPB 750 mg  Status:  Discontinued     750 mg 100 mL/hr over 90 Minutes Intravenous Daily 01/10/18 0600 01/10/18 1508     Subjective: Denies fevers.  Notes cough.  He's had some chills at times. Notes impaired drainage from pleurx and increased SOB over this time.  Objective: Vitals:   01/09/18 2100 01/09/18 2201 01/10/18 0625 01/10/18 1418  BP: (!) 167/79 (!) 165/75 (!) 100/55 (!) 144/64  Pulse: 71 71 79 77  Resp: (!) 24 20 20  (!) 44  Temp:  98.2 F (36.8 C) 99.4 F (37.4 C) 98.7 F (37.1 C)  TempSrc:  Oral Oral Oral  SpO2: 94% 91% 92% 90%  Weight:   65.5 kg   Height:        Intake/Output Summary (Last 24 hours) at 01/10/2018 1505 Last data filed at 01/10/2018 1448 Gross per 24 hour  Intake 700 ml  Output 600 ml  Net 100 ml   Filed Weights   01/09/18 1802 01/10/18 0625  Weight: 66.7 kg 65.5 kg    Examination:  General exam: Appears calm and comfortable  Respiratory system: Clear to auscultation. Decreased  breath sounds at bases.  L pleurx catheter.   Cardiovascular system: S1 & S2 heard, RRR.  Gastrointestinal system: Abdomen is nondistended, soft and nontender Central nervous system: Alert and oriented. No focal neurological deficits. Extremities: no lee Skin: No rashes, lesions or ulcers Psychiatry: Judgement and insight appear normal. Mood & affect appropriate.     Data Reviewed: I have personally reviewed following labs and imaging studies  CBC: Recent Labs  Lab 01/09/18 1648 01/10/18 0316  WBC 10.7* 10.9*  NEUTROABS 9.2*  --   HGB 9.4* 9.7*  HCT 30.7* 31.8*  MCV 96.8 97.0  PLT 218 875   Basic Metabolic Panel: Recent Labs  Lab 01/09/18 1648 01/10/18 0316  NA 139 140  K 4.1 3.8  CL 101 101  CO2 29 29  GLUCOSE  212* 180*  BUN 23 19  CREATININE 0.65 0.69  CALCIUM 8.4* 8.4*   GFR: Estimated Creatinine Clearance: 63.7 mL/min (by C-G formula based on SCr of 0.69 mg/dL). Liver Function Tests: No results for input(s): AST, ALT, ALKPHOS, BILITOT, PROT, ALBUMIN in the last 168 hours. No results for input(s): LIPASE, AMYLASE in the last 168 hours. No results for input(s): AMMONIA in the last 168 hours. Coagulation Profile: No results for input(s): INR, PROTIME in the last 168 hours. Cardiac Enzymes: No results for input(s): CKTOTAL, CKMB, CKMBINDEX, TROPONINI in the last 168 hours. BNP (last 3 results) No results for input(s): PROBNP in the last 8760 hours. HbA1C: No results for input(s): HGBA1C in the last 72 hours. CBG: Recent Labs  Lab 01/10/18 0757 01/10/18 1126  GLUCAP 167* 223*   Lipid Profile: No results for input(s): CHOL, HDL, LDLCALC, TRIG, CHOLHDL, LDLDIRECT in the last 72 hours. Thyroid Function Tests: No results for input(s): TSH, T4TOTAL, FREET4, T3FREE, THYROIDAB in the last 72 hours. Anemia Panel: No results for input(s): VITAMINB12, FOLATE, FERRITIN, TIBC, IRON, RETICCTPCT in the last 72 hours. Sepsis Labs: No results for input(s):  PROCALCITON, LATICACIDVEN in the last 168 hours.  No results found for this or any previous visit (from the past 240 hour(s)).       Radiology Studies: Dg Chest 2 View  Result Date: 01/09/2018 CLINICAL DATA:  History of left lung cancer.  Cough. EXAM: CHEST - 2 VIEW COMPARISON:  CT scan December 08, 2017 FINDINGS: There is a small right pleural effusion which is similar in the interval. Opacity under the effusion is likely atelectasis. Opacity in the left perihilar region is at the site of the patient's treated malignancy. Hazy opacity is seen in the lower left chest with a left-sided effusion also noted. No pneumothorax. Stable right Port-A-Cath. No other acute interval changes. IMPRESSION: 1. Bilateral pleural effusions, left greater than right. 2. Opacity in left perihilar region is at least partially explained by the patient's known treated malignancy. 3. Hazy opacity more inferiorly in the left lung may represent layering effusion and underlying opacity/atelectasis. Electronically Signed   By: Dorise Bullion III M.D   On: 01/09/2018 17:13        Scheduled Meds: . cholecalciferol  1,000 Units Oral Daily  . feeding supplement (ENSURE ENLIVE)  237 mL Oral BID BM  . gabapentin  400 mg Oral QHS  . insulin aspart  0-9 Units Subcutaneous TID WC  . insulin glargine  16 Units Subcutaneous QHS  . mirtazapine  15 mg Oral QHS  . multivitamin with minerals  1 tablet Oral Daily  . pantoprazole  40 mg Oral Daily  . rosuvastatin  20 mg Oral Daily  . tamsulosin  0.4 mg Oral QHS   Continuous Infusions: . levofloxacin (LEVAQUIN) IV 750 mg (01/10/18 0653)     LOS: 0 days    Time spent: over 30 min 45 min in critical care time with pt with hypoxia requiring bipap.   Fayrene Helper, MD [facility] Pager [phone]  If 7PM-7AM, please contact night-coverage [email or web address] Password [email or web address] 01/10/2018, 3:05 PM

## 2018-01-10 NOTE — Progress Notes (Signed)
Pt labored breathing. Resp 42. Oxygen saturations 84% on 6 L/min. Dr. Florene Glen notified. Respiratory Therapist called. Pt placed on NRB. MD at bedside. Rapid response called. Pt transferred to ICU.

## 2018-01-11 ENCOUNTER — Inpatient Hospital Stay (HOSPITAL_COMMUNITY): Payer: Medicare Other

## 2018-01-11 DIAGNOSIS — I10 Essential (primary) hypertension: Secondary | ICD-10-CM

## 2018-01-11 LAB — COMPREHENSIVE METABOLIC PANEL
ALT: 16 U/L (ref 0–44)
AST: 16 U/L (ref 15–41)
Albumin: 2.4 g/dL — ABNORMAL LOW (ref 3.5–5.0)
Alkaline Phosphatase: 51 U/L (ref 38–126)
Anion gap: 7 (ref 5–15)
BUN: 26 mg/dL — ABNORMAL HIGH (ref 8–23)
CHLORIDE: 102 mmol/L (ref 98–111)
CO2: 29 mmol/L (ref 22–32)
Calcium: 8.6 mg/dL — ABNORMAL LOW (ref 8.9–10.3)
Creatinine, Ser: 0.81 mg/dL (ref 0.61–1.24)
GFR calc Af Amer: >60 mL/min (ref >60)
GFR calc non Af Amer: >60 mL/min (ref >60)
Glucose, Bld: 215 mg/dL — ABNORMAL HIGH (ref 70–99)
Potassium: 4.2 mmol/L (ref 3.5–5.1)
Sodium: 138 mmol/L (ref 135–145)
Total Bilirubin: 0.6 mg/dL (ref 0.3–1.2)
Total Protein: 6.3 g/dL — ABNORMAL LOW (ref 6.5–8.1)

## 2018-01-11 LAB — GLUCOSE, CAPILLARY
GLUCOSE-CAPILLARY: 167 mg/dL — AB (ref 70–99)
Glucose-Capillary: 150 mg/dL — ABNORMAL HIGH (ref 70–99)
Glucose-Capillary: 154 mg/dL — ABNORMAL HIGH (ref 70–99)
Glucose-Capillary: 157 mg/dL — ABNORMAL HIGH (ref 70–99)
Glucose-Capillary: 191 mg/dL — ABNORMAL HIGH (ref 70–99)
Glucose-Capillary: 192 mg/dL — ABNORMAL HIGH (ref 70–99)
Glucose-Capillary: 234 mg/dL — ABNORMAL HIGH (ref 70–99)

## 2018-01-11 LAB — CBC
HCT: 33 % — ABNORMAL LOW (ref 39.0–52.0)
Hemoglobin: 10 g/dL — ABNORMAL LOW (ref 13.0–17.0)
MCH: 29.7 pg (ref 26.0–34.0)
MCHC: 30.3 g/dL (ref 30.0–36.0)
MCV: 97.9 fL (ref 80.0–100.0)
Platelets: 177 10*3/uL (ref 150–400)
RBC: 3.37 MIL/uL — ABNORMAL LOW (ref 4.22–5.81)
RDW: 16 % — ABNORMAL HIGH (ref 11.5–15.5)
WBC: 18.7 10*3/uL — ABNORMAL HIGH (ref 4.0–10.5)
nRBC: 0 % (ref 0.0–0.2)

## 2018-01-11 LAB — BODY FLUID CELL COUNT WITH DIFFERENTIAL
Eos, Fluid: 0 %
Lymphs, Fluid: 61 %
Monocyte-Macrophage-Serous Fluid: 17 % — ABNORMAL LOW (ref 50–90)
Neutrophil Count, Fluid: 22 % (ref 0–25)
Total Nucleated Cell Count, Fluid: 626 cu mm (ref 0–1000)

## 2018-01-11 LAB — PROTEIN, PLEURAL OR PERITONEAL FLUID

## 2018-01-11 LAB — MRSA PCR SCREENING: MRSA BY PCR: POSITIVE — AB

## 2018-01-11 LAB — PATHOLOGIST SMEAR REVIEW

## 2018-01-11 LAB — MAGNESIUM: Magnesium: 1.8 mg/dL (ref 1.7–2.4)

## 2018-01-11 LAB — LACTATE DEHYDROGENASE, PLEURAL OR PERITONEAL FLUID: LD, Fluid: 82 U/L — ABNORMAL HIGH (ref 3–23)

## 2018-01-11 LAB — LACTIC ACID, PLASMA: Lactic Acid, Venous: 0.7 mmol/L (ref 0.5–1.9)

## 2018-01-11 MED ORDER — CHLORHEXIDINE GLUCONATE CLOTH 2 % EX PADS
6.0000 | MEDICATED_PAD | Freq: Every day | CUTANEOUS | Status: DC
  Administered 2018-01-15 – 2018-01-16 (×2): 6 via TOPICAL

## 2018-01-11 MED ORDER — VANCOMYCIN HCL IN DEXTROSE 1-5 GM/200ML-% IV SOLN
1000.0000 mg | INTRAVENOUS | Status: DC
  Administered 2018-01-11 – 2018-01-13 (×3): 1000 mg via INTRAVENOUS
  Filled 2018-01-11 (×4): qty 200

## 2018-01-11 MED ORDER — ORAL CARE MOUTH RINSE
15.0000 mL | Freq: Two times a day (BID) | OROMUCOSAL | Status: DC
  Administered 2018-01-11 – 2018-01-17 (×6): 15 mL via OROMUCOSAL

## 2018-01-11 MED ORDER — CHLORHEXIDINE GLUCONATE 0.12 % MT SOLN
15.0000 mL | Freq: Two times a day (BID) | OROMUCOSAL | Status: DC
  Administered 2018-01-11 – 2018-01-17 (×13): 15 mL via OROMUCOSAL
  Filled 2018-01-11 (×12): qty 15

## 2018-01-11 MED ORDER — LIP MEDEX EX OINT
1.0000 "application " | TOPICAL_OINTMENT | CUTANEOUS | Status: DC | PRN
  Filled 2018-01-11: qty 7

## 2018-01-11 MED ORDER — MUPIROCIN 2 % EX OINT
TOPICAL_OINTMENT | Freq: Two times a day (BID) | CUTANEOUS | Status: DC
  Administered 2018-01-11: 1 via NASAL
  Administered 2018-01-11 – 2018-01-12 (×2): via NASAL
  Administered 2018-01-12: 1 via NASAL
  Administered 2018-01-13: 21:00:00 via NASAL
  Administered 2018-01-13: 1 via NASAL
  Administered 2018-01-14 – 2018-01-17 (×7): via NASAL
  Filled 2018-01-11 (×4): qty 22

## 2018-01-11 NOTE — Progress Notes (Signed)
Pharmacy Antibiotic Note  Steve Frank is a 84 y.o. male admitted on 01/09/2018 with pneumonia.  Pharmacy has been consulted for vancomycin and cefepime dosing.  Pt transferred to ICU after rapid response event.  Pt with PMH of COPD and metastatic non-small cell lung cancer with left-sided Pleurx catheter admitted with progressive shortness of breath the last 2 weeks.   Day #2 full abx therapy Tmax 103.5 yesterday, currently afebrile WBC up 18.7, steroids started SCr 0.81, CrCl ~60 ml/min  Plan:   Adjust vancomycin to 1g IV q24h for estimated AUC 428 goal 400-500)  Check vancomycin levels as needed  Continue Cefepime 2 gr IV x1, then 1 gr IV q8h  Azithromycin 500 mg PO q24h  Monitor clinical course, renal function, cultures as available   Height: 6' (182.9 cm) Weight: 141 lb 15.6 oz (64.4 kg) IBW/kg (Calculated) : 77.6  Temp (24hrs), Avg:98.9 F (37.2 C), Min:97.3 F (36.3 C), Max:103.5 F (39.7 C)  Recent Labs  Lab 01/09/18 1648 01/10/18 0316 01/10/18 1944 01/11/18 0003 01/11/18 0345  WBC 10.7* 10.9* 13.6*  --  18.7*  CREATININE 0.65 0.69 0.79  --  0.81  LATICACIDVEN  --   --  1.0 0.7  --     Estimated Creatinine Clearance: 61.8 mL/min (by C-G formula based on SCr of 0.81 mg/dL).    Allergies  Allergen Reactions  . Ciprofloxacin Nausea And Vomiting    Antimicrobials this admission:  12/4 levofloxacin x 1  12/4 vancomycin >>  12/4 cefepime >>  12/5 azithromycin >>   Dose adjustments this admission:    Microbiology results:  12/4 strep pneumo antigen: neg 12/4  BCx:  12/4 MRSA PCR:   Thank you for allowing pharmacy to be a part of this patient's care.  Peggyann Juba, PharmD, BCPS Pager: [phone]' 01/11/2018 10:29 AM

## 2018-01-11 NOTE — Progress Notes (Signed)
Nasal swab positive for MRSA. No precautions needed.

## 2018-01-11 NOTE — Progress Notes (Signed)
PROGRESS NOTE    Steve Frank  ZOX:[id number] DOB: 01-14-1932 DOA: 01/09/2018 PCP: [name], MD   Brief Narrative:  Steve Frank is a 84 y.o. male with history of metastatic non-small cell lung cancer, CAD status post CABG, COPD presently on Keytruda being followed by Dr. Julien Nordmann, oncologist presents with worsening shortness of breath over the last 2 weeks.  As per the oncology notes patient has chronic shortness of breath secondary to pleural effusion and radiation pneumonitis.  Patient states his shortness of breath is acutely worsened and he has left-sided Pleurx catheter which has stopped draining for last 2 weeks gradually.  Patient also has been having some productive cough but denies any fever or chills.  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 Acute on chronic hypoxic respiratory failure with hypoxia likely from CAP Vs worsening pleural effusion Required bipap overnight, but transitioned to [place] supplemental O2  Afebrile with worsening leukocytosis Pleurx catheter not draining as L pleural effusion has resolved, currently with R sided effusion as per CT chest with Large L lung consolidation PCCM consulted s/p R thoracenetsis, body fluid culture pending Continue cefepime, azithromycin, vancomycin Monitor in SDU Palliative consulted for [place]  2. COPD Management as above Started on solumedrol, continue duonebs  3. Diabetes mellitus type 2 Continue Lantus insulin and sliding scale coverage.  4. Anemia likely from malignancy  Daily CBC  5. Metastatic non-small cell lung cancer being followed by Dr. Julien Nordmann.   DVT prophylaxis: SCD Code Status: DNR Family Communication: Son at bedside Disposition Plan:  TBD   Consultants:  IR PCCM  Procedures:   R sided thoracentesis   Antimicrobials:  Anti-infectives (From admission, onward)   Start     Dose/Rate Route Frequency Ordered Stop   01/11/18 1600  vancomycin (VANCOCIN) IVPB 1000 mg/200 mL premix     1,000 mg 200 mL/hr over 60 Minutes Intravenous Every 24 hours 01/11/18 1028     01/11/18 1200  vancomycin (VANCOCIN) 1,250 mg in sodium chloride 0.9 % 250 mL IVPB  Status:  Discontinued     1,250 mg 166.7 mL/hr over 90 Minutes Intravenous Every 24 hours 01/10/18 2050 01/11/18 1028   01/11/18 1000  azithromycin (ZITHROMAX) tablet 500 mg  Status:  Discontinued     500 mg Oral Every 24 hours 01/10/18 1510 01/10/18 1921   01/11/18 1000  cefTRIAXone (ROCEPHIN) 1 g in sodium chloride 0.9 % 100 mL IVPB  Status:  Discontinued     1 g 200 mL/hr over 30 Minutes Intravenous Every 24 hours 01/10/18 1516 01/10/18 1921   01/11/18 1000  azithromycin (ZITHROMAX) tablet 500 mg     500 mg Oral Every 24 hours 01/10/18 1924 01/18/18 0959   01/11/18 0400  ceFEPIme (MAXIPIME) 1 g in sodium chloride 0.9 % 100 mL IVPB     1 g 200 mL/hr over 30 Minutes Intravenous Every 8 hours 01/10/18 2050     01/10/18 2000  vancomycin (VANCOCIN) 1,250 mg in sodium chloride 0.9 % 250 mL IVPB     1,250 mg 166.7 mL/hr over 90 Minutes Intravenous  Once 01/10/18 1856 01/10/18 2113   01/10/18 1930  ceFEPIme (MAXIPIME) 2 g in sodium chloride 0.9 % 100 mL IVPB  2 g 200 mL/hr over 30 Minutes Intravenous STAT 01/10/18 1923 01/10/18 2011   01/10/18 1600  cefTRIAXone (ROCEPHIN) 1 g in sodium chloride 0.9 % 100 mL IVPB  Status:  Discontinued     1 g 200 mL/hr over 30 Minutes Intravenous Every 24 hours 01/10/18 1510 01/10/18 1516   01/10/18 0615  levofloxacin (LEVAQUIN) IVPB 750 mg  Status:  Discontinued     750 mg 100 mL/hr over 90 Minutes Intravenous Daily 01/10/18 0600 01/10/18 1508     Subjective: On bipap, refused to answer any questions as he stated he wants to be left  alone  Objective: Vitals:   01/11/18 1300 01/11/18 1400 01/11/18 1500 01/11/18 1600  BP:  (!) 105/51  (!) 98/52  Pulse: 71 63 65 65  Resp: (!) 23 17 20 20   Temp:   97.6 F (36.4 C)   TempSrc:   Axillary   SpO2: 100% 97% 96% 93%  Weight:      Height:        Intake/Output Summary (Last 24 hours) at 01/11/2018 1754 Last data filed at 01/11/2018 1400 Gross per 24 hour  Intake 338 ml  Output 425 ml  Net -87 ml   Filed Weights   01/10/18 0625 01/10/18 1900 01/11/18 0331  Weight: 65.5 kg 64.4 kg 64.4 kg    Examination:  General: On bipap, mild distress   Cardiovascular: S1, S2 present  Respiratory: Diminished breath sounds b/l  Abdomen: Soft, nontender, nondistended, bowel sounds present  Musculoskeletal: No bilateral pedal edema noted  Skin: Normal  Psychiatry: Unable to assess    Data Reviewed: I have personally reviewed following labs and imaging studies  CBC: Recent Labs  Lab 01/09/18 1648 01/10/18 0316 01/10/18 1944 01/11/18 0345  WBC 10.7* 10.9* 13.6* 18.7*  NEUTROABS 9.2*  --   --   --   HGB 9.4* 9.7* 10.1* 10.0*  HCT 30.7* 31.8* 32.6* 33.0*  MCV 96.8 97.0 94.2 97.9  PLT 218 202 221 888   Basic Metabolic Panel: Recent Labs  Lab 01/09/18 1648 01/10/18 0316 01/10/18 1944 01/11/18 0345  NA 139 140 136 138  K 4.1 3.8 4.0 4.2  CL 101 101 98 102  CO2 29 29 28 29   GLUCOSE 212* 180* 227* 215*  BUN 23 19 19  26*  CREATININE 0.65 0.69 0.79 0.81  CALCIUM 8.4* 8.4* 8.3* 8.6*  MG  --   --   --  1.8   GFR: Estimated Creatinine Clearance: 61.8 mL/min (by C-G formula based on SCr of 0.81 mg/dL). Liver Function Tests: Recent Labs  Lab 01/11/18 0345  AST 16  ALT 16  ALKPHOS 51  BILITOT 0.6  PROT 6.3*  ALBUMIN 2.4*   No results for input(s): LIPASE, AMYLASE in the last 168 hours. No results for input(s): AMMONIA in the last 168 hours. Coagulation Profile: No results for input(s): INR, PROTIME in the last 168 hours. Cardiac Enzymes: No results  for input(s): CKTOTAL, CKMB, CKMBINDEX, TROPONINI in the last 168 hours. BNP (last 3 results) No results for input(s): PROBNP in the last 8760 hours. HbA1C: No results for input(s): HGBA1C in the last 72 hours. CBG: Recent Labs  Lab 01/11/18 0026 01/11/18 0329 01/11/18 0838 01/11/18 1126 01/11/18 1558  GLUCAP 234* 191* 154* 157* 150*   Lipid Profile: No results for input(s): CHOL, HDL, LDLCALC, TRIG, CHOLHDL, LDLDIRECT in the last 72 hours. Thyroid Function Tests: No results for input(s): TSH, T4TOTAL, FREET4, T3FREE, THYROIDAB in the last 72 hours. Anemia Panel: No  results for input(s): VITAMINB12, FOLATE, FERRITIN, TIBC, IRON, RETICCTPCT in the last 72 hours. Sepsis Labs: Recent Labs  Lab 01/10/18 1944 01/11/18 0003  LATICACIDVEN 1.0 0.7    Recent Results (from the past 240 hour(s))  Culture, blood (routine x 2)     Status: None (Preliminary result)   Collection Time: 01/10/18  7:44 PM  Result Value Ref Range Status   Specimen Description   Final    BLOOD RIGHT ARM Performed at [facility] [address] [place], [place] [address]    Special Requests   Final    BOTTLES DRAWN AEROBIC AND ANAEROBIC Blood Culture adequate volume Performed at [facility] [address] [place], [place] [address]    Culture   Final    NO GROWTH < 24 HOURS Performed at [facility] [address], [place], [place] [address]    Report Status PENDING  Incomplete  Culture, blood (routine x 2)     Status: None (Preliminary result)   Collection Time: 01/10/18  7:44 PM  Result Value Ref Range Status   Specimen Description   Final    BLOOD LEFT ARM Performed at [facility] [address], [place], [place] [address]    Special Requests   Final    BOTTLES DRAWN AEROBIC AND ANAEROBIC Blood Culture adequate volume Performed at [facility] [address], [place], [place] [address]    Culture   Final     NO GROWTH < 24 HOURS Performed at [facility] [address], [place], [place] [address]    Report Status PENDING  Incomplete  MRSA PCR Screening     Status: Abnormal   Collection Time: 01/11/18  4:24 AM  Result Value Ref Range Status   MRSA by PCR POSITIVE (A) NEGATIVE Final    Comment:        The GeneXpert MRSA Assay (FDA approved for NASAL specimens only), is one component of a comprehensive MRSA colonization surveillance program. It is not intended to diagnose MRSA infection nor to guide or monitor treatment for MRSA infections. RESULT CALLED TO, READ BACK BY AND VERIFIED WITHPennie Rushing [id number] @ 1884 BY J SCOTTON Performed at [facility] [address], [place], [place] [address]   Body fluid culture     Status: None (Preliminary result)   Collection Time: 01/11/18 10:56 AM  Result Value Ref Range Status   Specimen Description   Final    PLEURAL Performed at [facility] [address] [place], [place] [address]    Special Requests   Final    NONE Performed at [facility], [place] [address], [place], [place] [address]    Gram Stain   Final    RARE WBC PRESENT, PREDOMINANTLY MONONUCLEAR NO ORGANISMS SEEN Performed at [facility], [place] [address] [place], [place] [address]    Culture PENDING  Incomplete   Report Status PENDING  Incomplete         Radiology Studies: Ct Chest Wo Contrast  Result Date: 01/10/2018 CLINICAL DATA:  pleural effusion, pleurx drain not functioning EXAM: CT CHEST WITHOUT CONTRAST TECHNIQUE: Multidetector CT imaging of the chest was performed following the standard protocol without IV contrast. COMPARISON:  Chest CT dated 12/08/2017. FINDINGS: Cardiovascular: Extensive aortic atherosclerosis. Heart size is within normal limits. Surgical changes of CABG. Mediastinum/Nodes: Scattered small lymph nodes within the mediastinum. Largest lymph node is again seen  within  the subcarinal space, measuring 1.7 cm short axis dimension, unchanged in the short-term interval. Additional lymph nodes within the mediastinum and perihilar regions were better visualized on the previous study due to IV contrast administration. Esophagus is unremarkable. Trachea appears normal. Lungs/Pleura: RIGHT pleural effusion, moderate to large in size, slightly increased in size compared to the previous exam, with associated atelectasis. Large consolidation within the LEFT lung, involving all lobes but centered at the LEFT lung base, significantly increased in size/extent compared to the previous exam. There is a small loculated appearing pleural effusion along the LEFT lateral chest wall, new compared to the previous exam. Emphysematous changes bilaterally, mild to moderate in degree, upper lobe predominant. Upper Abdomen: Limited images of the upper abdomen are unremarkable. Musculoskeletal: Compression fracture deformity of the T6 vertebral body is stable in the short-term interval. Healing/healed fracture of the LEFT eleventh posterior rib. No new/acute osseous abnormality. IMPRESSION: 1. Large consolidation within the LEFT lung, involving all lobes but centered at the LEFT lung base, significantly increased in size/extent compared to the previous exam, compatible with worsening pneumonia, alternatively severe post radiation change. As discussed on the previous chest CT report, some component of lymphangitic spread of tumor cannot be excluded. 2. RIGHT pleural effusion, moderate to large in size, slightly increased in size compared to the previous exam, with associated atelectasis. 3. Small loculated appearing pleural effusion along the LEFT lateral chest wall, new compared to the previous exam. Pleural drain extends to the posterior aspect of the LEFT lung base. 4. Emphysematous changes, mild to moderate in degree, upper lobe predominant. 5. Stable compression fracture deformity of the T6 vertebral  body. 6. Healing/healed fracture of the LEFT eleventh posterior rib. Aortic Atherosclerosis (ICD10-I70.0) and Emphysema (ICD10-J43.9). Electronically Signed   By: Franki Cabot M.D.   On: 01/10/2018 17:27   Dg Chest Port 1 View  Result Date: 01/11/2018 CLINICAL DATA:  Post right thoracentesis EXAM: PORTABLE CHEST 1 VIEW COMPARISON:  CT chest of 01/10/2018 and chest x-ray of 01/09/2018 FINDINGS: There is a small right apical pneumothorax present after right thoracentesis. Some of the right pleural effusion has been evacuated. Left effusion also remains with bibasilar atelectasis present. Left mid lung opacity is stable. Heart size is stable. Right-sided Port-A-Cath tip overlies the lower SVC. IMPRESSION: 1. Small right apical pneumothorax after right thoracentesis. 2. Some reduction in right pleural effusion after thoracentesis with bilateral pleural effusions and bibasilar atelectasis remaining. Electronically Signed   By: Ivar Drape M.D.   On: 01/11/2018 11:30        Scheduled Meds: . azithromycin  500 mg Oral Q24H  . chlorhexidine  15 mL Mouth Rinse BID  . Chlorhexidine Gluconate Cloth  6 each Topical Q0600  . cholecalciferol  1,000 Units Oral Daily  . feeding supplement (ENSURE ENLIVE)  237 mL Oral BID BM  . gabapentin  400 mg Oral QHS  . insulin aspart  0-9 Units Subcutaneous Q4H  . insulin glargine  16 Units Subcutaneous QHS  . ipratropium-albuterol  3 mL Nebulization Q6H  . mouth rinse  15 mL Mouth Rinse q12n4p  . methylPREDNISolone (SOLU-MEDROL) injection  60 mg Intravenous Q6H  . mirtazapine  15 mg Oral QHS  . multivitamin with minerals  1 tablet Oral Daily  . mupirocin ointment   Nasal BID  . pantoprazole  40 mg Oral Daily  . rosuvastatin  20 mg Oral Daily  . tamsulosin  0.4 mg Oral QHS   Continuous Infusions: . ceFEPime (MAXIPIME) IV Stopped (  01/11/18 1247)  . vancomycin 1,000 mg (01/11/18 1720)     LOS: 1 day    Alma Friendly, MD [facility]   If  7PM-7AM, please contact night-coverage 01/11/2018, 5:54 PM

## 2018-01-11 NOTE — Progress Notes (Signed)
PMT consult received and chart reviewed. PMT provider to meet with wife at bedside 12/6 around 9:15am. Thank you.  NO CHARGE  Ihor Dow, [employer], FNP-C Palliative Medicine Team  Phone: [phone] Fax: [phone]

## 2018-01-11 NOTE — Progress Notes (Addendum)
PULMONARY / CRITICAL CARE MEDICINE   NAME:  Steve Frank, MRN:  [id number], DOB:  1931-05-25, LOS: 1 ADMISSION DATE:  01/09/2018, CONSULTATION DATE: 01/10/2018 REFERRING MD: Dr. Florene Glen, CHIEF COMPLAINT: Shortness of breath  BRIEF HISTORY:    84 year old male with metastatic non-small cell lung cancer bilateral pleural effusion s/p lt pleurx adm with healthcare associate pneumonia and worsening fibrosis of his left lung suspecting related to radiation. Resp failure requiring bipap 12/4   SIGNIFICANT PAST MEDICAL HISTORY   -COPD -CAD status post CABG -Metastatic non-small cell lung cancer status post radiation -Left Pleurx catheter  SIGNIFICANT EVENTS:  Resp failure requiring bipap 12/4  STUDIES:   CT chest 1. Large consolidation within the LEFT lung, involving all lobes but centered at the LEFT lung base, significantly increased in size/extent compared to the previous exam, compatible with worsening pneumonia, alternatively severe post radiation change. As discussed on the previous chest CT report, some component of lymphangitic spread of tumor cannot be excluded. 2. RIGHT pleural effusion, moderate to large in size, slightly increased in size compared to the previous exam, with associated atelectasis. 3. Small loculated appearing pleural effusion along the LEFT lateral chest wall, new compared to the previous exam. Pleural drain extends to the posterior aspect of the LEFT lung base. 4. Emphysematous changes, mild to moderate in degree, upper lobe predominant. 5. Stable compression fracture deformity of the T6 vertebral body. 6. Healing/healed fracture of the LEFT eleventh posterior rib.   CULTURES:    ANTIBIOTICS:  Cefepime Vancomycin Azithromycin  LINES/TUBES:    CONSULTANTS:    SUBJECTIVE:  Denies pain  afebrile On BiPAP  CONSTITUTIONAL: BP (!) 117/49 (BP Location: Left Arm)   Pulse (!) 58   Temp (!) 97.5 F (36.4 C) (Axillary)   Resp 15   Ht 6' (1.829  m)   Wt 64.4 kg   SpO2 100%   BMI 19.26 kg/m   I/O last 3 completed shifts: In: 947.2 [P.O.:700; IV Piggyback:247.2] Out: 800 [Urine:800]     FiO2 (%):  [100 %] 100 %  PHYSICAL EXAM: General: Acutely ill on BiPAP, no distress neuro: Lethargic but follows one-step commands, nonfocal HEENT: Dry mucous membranes Cardiovascular: Normal heart sound no sounds or murmurs Lungs: Decrease breath sounds on right, crackles on left  Abdomen: Soft no tenderness no organomegaly Musculoskeletal: No edema Skin: No rash  RESOLVED PROBLEM LIST   ASSESSMENT AND PLAN   Assessment -Acute on chronic hypoxemic respiratory failure requiring bipap -Healthcare associated pneumonia vs Keytruda induced pneumonitis   -I was able to transition him off BiPAP to nasal cannula -Agree with broad-spectrum cefepime/vancomycin -Empiric Solu-Medrol 60 every 6  -Metastatic non-small cell lung cancer  -Bilateral pleural effusion right more than left  -Consider thoracentesis on the right -Pleurx on the left is not draining much   -History of COPD -Duo nebs every 6 hours     SUMMARY OF TODAY'S PLAN:  Transition of BiPAP, DNR noted Can consider right thoracentesis Updated family at bedside in detail    Kara Mead MD. FCCP. [facility] Pager [phone] If no response call [phone]   01/11/2018    Addendum - D/w family No more procedures Goal would be home hospice if he does well next 24h Will cancel IR thoracentesis  Sharnee Douglass V. [place] Soho MD

## 2018-01-11 NOTE — Progress Notes (Signed)
Palliative Medicine RN Note: Consult order noted that family agrees with home hospice. RNCM can set up home hospice without PMT involvement if decisions are clear.  We are happy to be involved, but would hate for this discharge planning to be delayed if the family is comfortable with hospice.  PMT will continue to follow the chart and will see Steve Frank when we have an available provider.  Marjie Skiff Gaila Engebretsen, RN, BSN, [facility] Palliative Medicine Team 01/11/2018 12:45 PM Office [phone]

## 2018-01-11 NOTE — Progress Notes (Signed)
IR requested for evaluation of patient's left PleurX catheter placed 10/13/2017 at [facility].  CT chest reviewed by Dr. Annamaria Boots. Per Dr. Annamaria Boots, left PleurX in good position with minimal residual left pleural effusion (probable cause of lack of drainage). In addition, noted right pleural effusion that could be contributing to patient's dyspnea.  Informed Dr. Horris Latino of findings. Recommended image-guided right thoracentesis to which she agreed. Plan for right thoracentesis in IR.  Bea Graff Charlynn Salih, PA-C 01/11/2018, 9:59 AM

## 2018-01-11 NOTE — Progress Notes (Signed)
Patient sleepy ,oral meds held. Family at bedside.

## 2018-01-11 NOTE — Procedures (Addendum)
Thoracentesis Procedure Note  Pre-operative Diagnosis: RT pleural effusion  Post-operative Diagnosis: same  Indications:  RT pleural effusion  Procedure Details  Consent: Informed consent was obtained. Risks of the procedure were discussed including: infection, bleeding, pain, pneumothorax.  Under sterile conditions the patient was positioned. Betadine solution and sterile drapes were utilized.  2% buffered lidocaine was used to anesthetize the RT 7th  rib space. Fluid was obtained without any difficulties and minimal blood loss.  A dressing was applied to the wound and wound care instructions were provided.   Findings 10 cc of bloody pleural fluid was obtained. A sample was sent to lab for cell counts, LDH & protein. Not enough to send for cytology. Subsequently bloody fluid obtained & procedure stopped since pt was a full assist.  Complications:  He developed hemoptysis post procedure        Condition: stable  Plan A follow up chest x-ray was ordered - small apical RT pneumothorax Bed Rest for 4 hours. Tylenol 650 mg. for pain.  Attending Attestation: I performed the procedure.  Leanna Sato [place] Soho MD [phone]

## 2018-01-12 ENCOUNTER — Telehealth: Payer: Self-pay | Admitting: *Deleted

## 2018-01-12 DIAGNOSIS — Z7189 Other specified counseling: Secondary | ICD-10-CM

## 2018-01-12 DIAGNOSIS — C7931 Secondary malignant neoplasm of brain: Secondary | ICD-10-CM

## 2018-01-12 DIAGNOSIS — Z515 Encounter for palliative care: Secondary | ICD-10-CM

## 2018-01-12 LAB — CBC WITH DIFFERENTIAL/PLATELET
Abs Immature Granulocytes: 0.17 10*3/uL — ABNORMAL HIGH (ref 0.00–0.07)
Basophils Absolute: 0 10*3/uL (ref 0.0–0.1)
Basophils Relative: 0 %
EOS PCT: 0 %
Eosinophils Absolute: 0 10*3/uL (ref 0.0–0.5)
HCT: 27.6 % — ABNORMAL LOW (ref 39.0–52.0)
HEMOGLOBIN: 8.4 g/dL — AB (ref 13.0–17.0)
Immature Granulocytes: 1 %
LYMPHS PCT: 1 %
Lymphs Abs: 0.2 10*3/uL — ABNORMAL LOW (ref 0.7–4.0)
MCH: 29.7 pg (ref 26.0–34.0)
MCHC: 30.4 g/dL (ref 30.0–36.0)
MCV: 97.5 fL (ref 80.0–100.0)
Monocytes Absolute: 0.3 10*3/uL (ref 0.1–1.0)
Monocytes Relative: 2 %
Neutro Abs: 16.6 10*3/uL — ABNORMAL HIGH (ref 1.7–7.7)
Neutrophils Relative %: 96 %
Platelets: 176 10*3/uL (ref 150–400)
RBC: 2.83 MIL/uL — ABNORMAL LOW (ref 4.22–5.81)
RDW: 15.9 % — ABNORMAL HIGH (ref 11.5–15.5)
WBC: 17.3 10*3/uL — ABNORMAL HIGH (ref 4.0–10.5)
nRBC: 0 % (ref 0.0–0.2)

## 2018-01-12 LAB — GLUCOSE, CAPILLARY
Glucose-Capillary: 164 mg/dL — ABNORMAL HIGH (ref 70–99)
Glucose-Capillary: 193 mg/dL — ABNORMAL HIGH (ref 70–99)
Glucose-Capillary: 195 mg/dL — ABNORMAL HIGH (ref 70–99)
Glucose-Capillary: 254 mg/dL — ABNORMAL HIGH (ref 70–99)
Glucose-Capillary: 308 mg/dL — ABNORMAL HIGH (ref 70–99)
Glucose-Capillary: 370 mg/dL — ABNORMAL HIGH (ref 70–99)

## 2018-01-12 LAB — BASIC METABOLIC PANEL
Anion gap: 10 (ref 5–15)
BUN: 49 mg/dL — ABNORMAL HIGH (ref 8–23)
CO2: 28 mmol/L (ref 22–32)
Calcium: 8.7 mg/dL — ABNORMAL LOW (ref 8.9–10.3)
Chloride: 102 mmol/L (ref 98–111)
Creatinine, Ser: 1.01 mg/dL (ref 0.61–1.24)
GFR calc Af Amer: >60 mL/min (ref >60)
GFR calc non Af Amer: >60 mL/min (ref >60)
Glucose, Bld: 208 mg/dL — ABNORMAL HIGH (ref 70–99)
Potassium: 4.4 mmol/L (ref 3.5–5.1)
Sodium: 140 mmol/L (ref 135–145)

## 2018-01-12 MED ORDER — ENOXAPARIN SODIUM 40 MG/0.4ML ~~LOC~~ SOLN
40.0000 mg | SUBCUTANEOUS | Status: DC
  Administered 2018-01-12 – 2018-01-15 (×4): 40 mg via SUBCUTANEOUS
  Filled 2018-01-12 (×4): qty 0.4

## 2018-01-12 MED ORDER — METHYLPREDNISOLONE SODIUM SUCC 125 MG IJ SOLR
60.0000 mg | Freq: Every day | INTRAMUSCULAR | Status: DC
  Administered 2018-01-13: 60 mg via INTRAVENOUS
  Filled 2018-01-12: qty 2

## 2018-01-12 NOTE — Consult Note (Signed)
Consultation Note Date: 01/12/2018   Patient Name: Steve Frank  DOB: Aug 28, 1931  MRN: [id number]  Age / Sex: 84 y.o., male  PCP: Steve Leach, MD Referring Physician: Alma Friendly, MD  Reason for Consultation: Establishing goals of care  HPI/Patient Profile: 84 y.o. male  with past medical history of metastatic lung cancer to brain, chronic SOB due to pleural effusions and radiation pneumonitis, left pleurx drain, HTN, HLD, GERD, DM, CAD s/p CABG, BPH, arthritis admitted on 01/09/2018 with shortness of breath. Patient with acute on chronic respiratory failure likely from CAP vs worsening bilateral pleural effusions requiring BiPAP. Currently off BiPAP and on [place]. Afebrile with worsening leukocytosis. CT chest with large left lung consolidation receiving antibiotics. Palliative medicine consultation for goals of care/family interest in hospice.   Clinical Assessment and Goals of Care:  I have reviewed medical records, discussed with care team, and met with patient and wife (Steve Frank) at bedside to discuss diagnosis, GOC, EOL wishes, disposition and options. Patient is awake, alert and oriented. He ate majority of his breakfast. He appears comfortable on 4L [place].   Introduced Palliative Medicine as specialized medical care for people living with serious illness. It focuses on providing relief from the symptoms and stress of a serious illness. The goal is to improve quality of life for both the patient and the family.  We discussed a brief life review of the patient. [employer]. Married to Steve Frank for 61 years. They have four children and many grand and great-grandchildren. Prior to admission, ,living home with wife and their dog, Steve Frank. Baseline, "Steve Frank" walks with a walker and requires assist with ADL's. Appetite has been fair. Frequently drinks ensures. Diagnosed with metastatic lung cancer in 2018. He has  been receiving Steve Frank every 3 weeks and tolerating well. Denies symptoms. On home oxygen at 3L.   Discussed events leading up to hospitalization and course of hospital diagnoses and interventions. Steve Frank recalls her conversation with family and Steve Frank a few days ago. She confirms their decision against resuscitation and intubation.    Further discussed advanced directives, concepts specific to code status, artifical feeding and hydration, and rehospitalization. Introduced and completed MOST form with patient and wife. Patient wishes include DNR/DNI, limited interventions including BiPAP/CPAP if necessary, ABX/IVF if indicated, and NO feeding tube. He shares that he would not wish to be kept alive without meaning or quality of life. The natural disease trajectory and expectations at EOL were discussed. Durable DNR completed. Copies of MOST and Durable DNR made for wife/family.   I attempted to elicit values and goals of care important to the patient and wife. They both speak of the importance of keeping him at home with the dog. Wife shares that she prayed for him not to have pain from the very beginning of cancer diagnosis, and she is thankful her prayers have been answered and he does not experience pain.   The difference between aggressive medical intervention and comfort care was considered in light of the patient's goals of care.  Palliative Care and hospice services outpatient were explained and offered. Discussed in detail explaining hospice philosophy including comfort, quality, dignity and emphasis on symptom management. I did explain that he would no longer f/u with oncology or receive Steve Frank if he chose to start hospice services. Patient wishes to further discuss outpatient palliative versus hospice services with his wife and family. He feels it may be too soon to get hospice services started. His next scheduled dose of Steve Frank is December 17th. Education provided on difference between  palliative and hospice options.   Questions and concerns were addressed.  Hard Choices booklet left for review. Therapeutic listening as patient and family share many stories. Emotional/spiritual support provided.    SUMMARY OF RECOMMENDATIONS    Good initial palliative discussion with patient/wife.   Completed MOST form. Patient wishes including DNR/DNI, limited interventions including BiPAP/CPAP if necessary, ABX/IVF if indicated, and NO feeding tube.   Durable DNR completed. Copies made for wife.   Discussed in detail outpatient palliative versus hospice options. It remains important to patient and wife to keep him in his HOME. Discussed how hospice can help facilitate keeping him home, focusing on his comfort, and preventing re-hospitalization. Patient/wife have not yet made decision about hospice and plan to further discuss as a family.   Code Status/Advance Care Planning:  DNR  Symptom Management:   Per attending  Palliative Prophylaxis:   Aspiration, Delirium Protocol, Frequent Pain Assessment, Oral Care and Turn Reposition  Psycho-social/Spiritual:   Desire for further Chaplaincy support: yes  Additional Recommendations: Caregiving  Support/Resources, Compassionate Wean Education and Education on Hospice  Prognosis:   Poor long-term prognosis secondary to metastatic lung cancer, acute on chronic respiratory failure, recurrent pleural effusions, and HCAP.  Discharge Planning: To Be Determined: home with palliative or home with hospice services. Pending patient/family decision.       Primary Diagnoses: Present on Admission: . Acute respiratory failure with hypoxia ([place]) . Stage IV squamous cell carcinoma of left lung ([place]) . Solitary left cerebellar brain metastasis ([place]) . Essential hypertension   I have reviewed the medical record, interviewed the patient and family, and examined the patient. The following aspects are pertinent.  Past Medical History:    Diagnosis Date  . Arthritis   . BPH (benign prostatic hyperplasia)   . CAD (coronary artery disease)    s/p 2 cabg's  . Cancer (HCC)    Basal and squamous cell  skin cancer  . Cerebellar mass   . Chest mass   . Diabetes ([place])    Type II  . GERD (gastroesophageal reflux disease)   . Head injury with loss of consciousness [facility])    age 29 or 32-   . Heart attack ([place])   . HLD (hyperlipidemia)   . Hypertension   . Macular degeneration   . Pneumonia     x 2 last 1071  . Stage IV squamous cell carcinoma of left lung ([place]) 10/20/2016   Social History   Socioeconomic History  . Marital status: Married    Spouse name: Not on file  . Number of children: 4  . Years of education: Not on file  . Highest education level: Not on file  Occupational History  . Occupation: retired  Scientific laboratory technician  . Financial resource strain: Not on file  . Food insecurity:    Worry: Not on file    Inability: Not on file  . Transportation needs:    Medical: Not on file    Non-medical: Not on  file  Tobacco Use  . Smoking status: Former Smoker    Packs/day: 2.00    Years: 35.00    Pack years: 70.00    Types: Cigarettes    Last attempt to quit: 02/07/1985    Years since quitting: 32.9  . Smokeless tobacco: Former Systems developer  . Tobacco comment: 10/13/16- Quit 31 years ago  Substance and Sexual Activity  . Alcohol use: No  . Drug use: No  . Sexual activity: Not Currently  Lifestyle  . Physical activity:    Days per week: Not on file    Minutes per session: Not on file  . Stress: Not on file  Relationships  . Social connections:    Talks on phone: Not on file    Gets together: Not on file    Attends religious service: Not on file    Active member of club or organization: Not on file    Attends meetings of clubs or organizations: Not on file    Relationship status: Not on file  Other Topics Concern  . Not on file  Social History Narrative   11-10-17 Unable to ask abuse questions wife with him today.    Family History  Problem Relation Age of Onset  . Cancer Brother        lung to head and neck involving lymph nodes  . Cancer Paternal Aunt        breast  . Cancer Paternal Uncle        leukemia  . Cancer Paternal Aunt        breast  . Cancer Paternal Uncle        lung   Scheduled Meds: . azithromycin  500 mg Oral Q24H  . chlorhexidine  15 mL Mouth Rinse BID  . Chlorhexidine Gluconate Cloth  6 each Topical Q0600  . cholecalciferol  1,000 Units Oral Daily  . feeding supplement (ENSURE ENLIVE)  237 mL Oral BID BM  . gabapentin  400 mg Oral QHS  . insulin aspart  0-9 Units Subcutaneous Q4H  . insulin glargine  16 Units Subcutaneous QHS  . ipratropium-albuterol  3 mL Nebulization Q6H  . mouth rinse  15 mL Mouth Rinse q12n4p  . [START ON 01/13/2018] methylPREDNISolone (SOLU-MEDROL) injection  60 mg Intravenous Daily  . mirtazapine  15 mg Oral QHS  . multivitamin with minerals  1 tablet Oral Daily  . mupirocin ointment   Nasal BID  . pantoprazole  40 mg Oral Daily  . rosuvastatin  20 mg Oral Daily  . tamsulosin  0.4 mg Oral QHS   Continuous Infusions: . ceFEPime (MAXIPIME) IV 200 mL/hr at 01/12/18 1400  . vancomycin Stopped (01/11/18 1820)   PRN Meds:.acetaminophen **OR** acetaminophen, docusate sodium, guaiFENesin-dextromethorphan, lip balm, ondansetron **OR** ondansetron (ZOFRAN) IV, polyethylene glycol, sodium chloride flush Medications Prior to Admission:  Prior to Admission medications   Medication Sig Start Date End Date Taking? Authorizing Provider  acetaminophen (TYLENOL) 325 MG tablet Take 650 mg by mouth every 6 (six) hours as needed for mild pain, fever or headache (for pain/headaches.).  08/11/16  Yes [provider]  albuterol (PROVENTIL HFA;VENTOLIN HFA) 108 (90 Base) MCG/ACT inhaler Inhale 2 puffs into the lungs every 6 (six) hours as needed (for wheezing/shortness of breath).  08/23/16  Yes [provider]  Cholecalciferol (VITAMIN D-1000 MAX ST)  1000 units tablet Take 1,000 Units by mouth daily.    Yes [provider]  docusate sodium (COLACE) 100 MG capsule Take 100 mg by mouth  2 (two) times daily as needed (for constipation.).   Yes [provider]  gabapentin (NEURONTIN) 400 MG capsule Take 400 mg by mouth at bedtime.   Yes [provider]  guaiFENesin-dextromethorphan (ROBITUSSIN DM) 100-10 MG/5ML syrup Take 15 mLs every 4 (four) hours as needed by mouth for cough.   Yes [provider]  HUMALOG 100 UNIT/ML injection Inject 2-8 Units into the skin See admin instructions. Inject 2-8 units under the skin three times daily before meals, per sliding scale. 08/27/16  Yes [provider]  LANTUS 100 UNIT/ML injection Inject 16 Units into the skin at bedtime.  08/23/16  Yes [provider]  magnesium hydroxide (MILK OF MAGNESIA) 400 MG/5ML suspension Take 30 mLs by mouth daily as needed (for constipation.).   Yes [provider]  mirtazapine (REMERON) 15 MG tablet Take 15 mg by mouth at bedtime. 08/23/16  Yes [provider]  Multiple Vitamin (MULTIVITAMIN WITH MINERALS) TABS tablet Take 1 tablet by mouth daily.   Yes [provider]  neomycin-bacitracin-polymyxin (NEOSPORIN) [phone] ointment Apply 1 application topically at bedtime.   Yes [provider]  omeprazole (PRILOSEC) 20 MG capsule Take 20 mg by mouth at bedtime.  07/25/16  Yes [provider]  polyethylene glycol (MIRALAX / GLYCOLAX) packet Take 17 g by mouth daily as needed (for constipation.).    Yes [provider]  rosuvastatin (CRESTOR) 20 MG tablet Take 20 mg by mouth daily.   Yes [provider]  tamsulosin (FLOMAX) 0.4 MG CAPS capsule Take 0.4 mg by mouth at bedtime.  03/25/17  Yes [provider]  B-D INS SYR ULTRAFINE 1CC/31G 31G X 5/16" 1 ML MISC  10/12/16   [provider]  clotrimazole-betamethasone (LOTRISONE) cream Apply 1 application  topically 2 (two) times daily.    [provider]  Lancets (FREESTYLE) lancets  05/01/17   [provider]  lidocaine-prilocaine (EMLA) cream Apply 1 application topically as needed. Patient not taking: Reported on 01/09/2018 11/09/16   Curt Bears, MD  prochlorperazine (COMPAZINE) 10 MG tablet Take 1 tablet (10 mg total) by mouth every 6 (six) hours as needed for nausea or vomiting. Patient not taking: Reported on 11/10/2017 11/09/16   Curt Bears, MD  TRELEGY ELLIPTA 100-62.5-25 MCG/INH AEPB Inhale 1 puff into the lungs daily. 10/05/17   [provider]  triamcinolone (KENALOG) 0.025 % ointment Apply 1 application 2 (two) times daily topically. Patient not taking: Reported on 11/10/2017 12/20/16   Maryanna Shape, NP   Allergies  Allergen Reactions  . Ciprofloxacin Nausea And Vomiting   Review of Systems  Constitutional: Positive for activity change and appetite change.  Respiratory: Positive for shortness of breath.   Neurological: Positive for weakness.   Physical Exam  Constitutional: He appears ill.  HENT:  Head: Normocephalic and atraumatic.  Cardiovascular: Regular rhythm.  Pulmonary/Chest: No accessory muscle usage. No tachypnea. No respiratory distress.  Abdominal: There is no tenderness.  Neurological: He is alert.  Skin: Skin is warm and dry. There is pallor.  Nursing note and vitals reviewed.  Vital Signs: BP (!) 118/51   Pulse 89   Temp (!) 97.3 F (36.3 C) (Oral)   Resp (!) 32   Ht 6' (1.829 m)   Wt 64.8 kg   SpO2 99%   BMI 19.38 kg/m  Pain Scale: 0-10   Pain Score: 0-No pain  SpO2: SpO2: 99 % O2 Device:SpO2: 99 % O2 Flow Rate: .O2 Flow Rate (L/min): 6 L/min  IO: Intake/output summary:   Intake/Output Summary (Last 24 hours) at 01/12/2018 1426 Last data filed at 01/12/2018 1400 Gross per 24 hour  Intake 588.37 ml  Output 400 ml  Net 188.37 ml    LBM: Last BM Date: 01/08/18 Baseline Weight: Weight: 66.7 kg Most  recent weight: Weight: 64.8 kg     Palliative Assessment/Data: PPS 40%   Flowsheet Rows     Most Recent Value  Intake Tab  Referral Department  Critical care  Palliative Care Primary Diagnosis  Cancer  Palliative Care Type  New Palliative care  Date first seen by Palliative Care  01/12/18  Clinical Assessment  Palliative Performance Scale Score  40%  Psychosocial & Spiritual Assessment  Palliative Care Outcomes  Patient/Family meeting held?  Yes  Who was at the meeting?  patient, wife, son  Palliative Care Outcomes  Clarified goals of care, Counseled regarding hospice, Provided end of life care assistance, Provided advance care planning, Provided psychosocial or spiritual support, Completed durable DNR, ACP counseling assistance      Time In: 0915 Time Out: 1100 Time Total: 146mn Greater than 50%  of this time was spent counseling and coordinating care related to the above assessment and plan.  Signed by:  MIhor Dow FNP-C Palliative Medicine Team  Phone: [phone]: [phone]  Please contact Palliative Medicine Team phone at [phone] questions and concerns.  For individual provider: See AShea Evans

## 2018-01-12 NOTE — Care Management Note (Signed)
Case Management Note  Patient Details  Name: Steve Frank MRN: [id number] Date of Birth: 1931/04/02  Subjective/Objective:                  84 year old male with metastatic non-small cell lung cancer bilateral pleural effusion s/p lt pleurx adm with healthcare associate pneumonia and worsening fibrosis of his left lung suspecting related to radiation. Resp failure requiring bipap 12/4   SIGNIFICANT PAST MEDICAL HISTORY   -COPD -CAD status post CABG -Metastatic non-small cell lung cancer status post radiation -Left Pleurx catheter  SIGNIFICANT EVENTS:  Resp failure requiring bipap 12/4   Action/Plan: Will follow for progression of care and clinical status. Will follow for case management needs none present at this time.  Expected Discharge Date:                  Expected Discharge Plan:     In-House Referral:     Discharge planning Services     Post Acute Care Choice:    Choice offered to:     DME Arranged:    DME Agency:     HH Arranged:    HH Agency:     Status of Service:     If discussed at [employer], dates discussed:    Additional Comments:  Leeroy Cha, RN 01/12/2018, 10:05 AM

## 2018-01-12 NOTE — Progress Notes (Addendum)
PROGRESS NOTE    Steve Frank  DGL:[id number] DOB: Jul 02, 1931 DOA: 01/09/2018 PCP: [name], MD   Brief Narrative:  Steve Frank is a 84 y.o. male with history of metastatic non-small cell lung cancer, CAD status post CABG, COPD presently on Keytruda being followed by Dr. Julien Nordmann, oncologist presents with worsening shortness of breath over the last 2 weeks.  As per the oncology notes patient has chronic shortness of breath secondary to pleural effusion and radiation pneumonitis.  Patient states his shortness of breath is acutely worsened and he has left-sided Pleurx catheter which has stopped draining for last 2 weeks gradually.  Patient also has been having some productive cough but denies any fever or chills.  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 Acute on chronic hypoxic respiratory failure with hypoxia likely from CAP Vs ?malignant pleural effusion Remains on [place] supplemental O2  Afebrile with leukocytosis (steroid induced) Pleurx catheter not draining as L pleural effusion has resolved, currently with R sided effusion as per CT chest with Large L lung consolidation PCCM consulted s/p R thoracenetsis, body fluid culture pending Continue cefepime, azithromycin, vancomycin Monitor in SDU Palliative consulted for [place]- Family yet to make decision for possible home hospice  2. COPD Management as above Started on solumedrol, continue duonebs  3. Diabetes mellitus type 2 Continue Lantus insulin and sliding scale coverage.  4. Anemia likely from malignancy  Daily CBC  5. Metastatic non-small cell lung cancer being followed by Dr. Julien Nordmann.   DVT prophylaxis: Lovenox Code Status:  DNR Family Communication: None at bedside Disposition Plan: TBD   Consultants:  IR PCCM Palliative team  Procedures:   R sided thoracentesis on 01/11/18   Antimicrobials:  Anti-infectives (From admission, onward)   Start     Dose/Rate Route Frequency Ordered Stop   01/11/18 1600  vancomycin (VANCOCIN) IVPB 1000 mg/200 mL premix     1,000 mg 200 mL/hr over 60 Minutes Intravenous Every 24 hours 01/11/18 1028     01/11/18 1200  vancomycin (VANCOCIN) 1,250 mg in sodium chloride 0.9 % 250 mL IVPB  Status:  Discontinued     1,250 mg 166.7 mL/hr over 90 Minutes Intravenous Every 24 hours 01/10/18 2050 01/11/18 1028   01/11/18 1000  azithromycin (ZITHROMAX) tablet 500 mg  Status:  Discontinued     500 mg Oral Every 24 hours 01/10/18 1510 01/10/18 1921   01/11/18 1000  cefTRIAXone (ROCEPHIN) 1 g in sodium chloride 0.9 % 100 mL IVPB  Status:  Discontinued     1 g 200 mL/hr over 30 Minutes Intravenous Every 24 hours 01/10/18 1516 01/10/18 1921   01/11/18 1000  azithromycin (ZITHROMAX) tablet 500 mg     500 mg Oral Every 24 hours 01/10/18 1924 01/18/18 0959   01/11/18 0400  ceFEPIme (MAXIPIME) 1 g in sodium chloride 0.9 % 100 mL IVPB     1 g 200 mL/hr over 30 Minutes Intravenous Every 8 hours 01/10/18 2050     01/10/18 2000  vancomycin (VANCOCIN) 1,250 mg in sodium chloride 0.9 % 250 mL IVPB     1,250 mg 166.7 mL/hr over 90 Minutes Intravenous  Once 01/10/18 1856 01/10/18 2113   01/10/18 1930  ceFEPIme (MAXIPIME) 2 g in sodium chloride 0.9 % 100 mL IVPB     2 g 200 mL/hr over 30 Minutes Intravenous STAT 01/10/18 1923 01/10/18 2011   01/10/18 1600  cefTRIAXone (ROCEPHIN) 1 g in sodium chloride 0.9 % 100 mL IVPB  Status:  Discontinued     1 g 200 mL/hr over 30 Minutes Intravenous Every 24 hours 01/10/18 1510 01/10/18 1516   01/10/18 0615  levofloxacin (LEVAQUIN) IVPB 750 mg  Status:  Discontinued     750 mg 100 mL/hr over 90 Minutes Intravenous Daily 01/10/18 0600 01/10/18 1508      Subjective: Pt reports feeling ok. Breathing improved, not requiring bipap overnight. Denies any new complaints  Objective: Vitals:   01/12/18 1100 01/12/18 1200 01/12/18 1300 01/12/18 1400  BP:  (!) 114/56  (!) 118/51  Pulse: 85 (!) 51 94 89  Resp: (!) 32 19 (!) 21 (!) 32  Temp:  (!) 97.3 F (36.3 C)    TempSrc:  Oral    SpO2: 93% 94% 91% 99%  Weight:      Height:        Intake/Output Summary (Last 24 hours) at 01/12/2018 1544 Last data filed at 01/12/2018 1400 Gross per 24 hour  Intake 588.37 ml  Output 400 ml  Net 188.37 ml   Filed Weights   01/10/18 1900 01/11/18 0331 01/12/18 0500  Weight: 64.4 kg 64.4 kg 64.8 kg    Examination:  General: NAD   Cardiovascular: S1, S2 present  Respiratory: Coarse BS bilaterally  Abdomen: Soft, nontender, nondistended, bowel sounds present  Musculoskeletal: No bilateral pedal edema noted  Skin: Normal  Psychiatry: Normal mood   Data Reviewed: I have personally reviewed following labs and imaging studies  CBC: Recent Labs  Lab 01/09/18 1648 01/10/18 0316 01/10/18 1944 01/11/18 0345 01/12/18 0500  WBC 10.7* 10.9* 13.6* 18.7* 17.3*  NEUTROABS 9.2*  --   --   --  16.6*  HGB 9.4* 9.7* 10.1* 10.0* 8.4*  HCT 30.7* 31.8* 32.6* 33.0* 27.6*  MCV 96.8 97.0 94.2 97.9 97.5  PLT 218 202 221 177 254   Basic Metabolic Panel: Recent Labs  Lab 01/09/18 1648 01/10/18 0316 01/10/18 1944 01/11/18 0345 01/12/18 0500  NA 139 140 136 138 140  K 4.1 3.8 4.0 4.2 4.4  CL 101 101 98 102 102  CO2 29 29 28 29 28   GLUCOSE 212* 180* 227* 215* 208*  BUN 23 19 19  26* 49*  CREATININE 0.65 0.69 0.79 0.81 1.01  CALCIUM 8.4* 8.4* 8.3* 8.6* 8.7*  MG  --   --   --  1.8  --    GFR: Estimated Creatinine Clearance: 49.9 mL/min (by C-G formula based on SCr of 1.01 mg/dL). Liver Function Tests: Recent Labs  Lab 01/11/18 0345  AST 16  ALT 16  ALKPHOS 51  BILITOT 0.6  PROT 6.3*  ALBUMIN 2.4*   No results for input(s): LIPASE,  AMYLASE in the last 168 hours. No results for input(s): AMMONIA in the last 168 hours. Coagulation Profile: No results for input(s): INR, PROTIME in the last 168 hours. Cardiac Enzymes: No results for input(s): CKTOTAL, CKMB, CKMBINDEX, TROPONINI in the last 168 hours. BNP (last 3 results) No results for input(s): PROBNP in the last 8760 hours. HbA1C: No results for input(s): HGBA1C in the last 72 hours. CBG: Recent Labs  Lab 01/11/18 2005 01/11/18 2348 01/12/18 0345 01/12/18 0834 01/12/18 1132  GLUCAP 167* 192* 195* 164* 254*   Lipid Profile: No results  for input(s): CHOL, HDL, LDLCALC, TRIG, CHOLHDL, LDLDIRECT in the last 72 hours. Thyroid Function Tests: No results for input(s): TSH, T4TOTAL, FREET4, T3FREE, THYROIDAB in the last 72 hours. Anemia Panel: No results for input(s): VITAMINB12, FOLATE, FERRITIN, TIBC, IRON, RETICCTPCT in the last 72 hours. Sepsis Labs: Recent Labs  Lab 01/10/18 1944 01/11/18 0003  LATICACIDVEN 1.0 0.7    Recent Results (from the past 240 hour(s))  Culture, blood (routine x 2)     Status: None (Preliminary result)   Collection Time: 01/10/18  7:44 PM  Result Value Ref Range Status   Specimen Description   Final    BLOOD RIGHT ARM Performed at [facility] [address], [place], [place] [address]    Special Requests   Final    BOTTLES DRAWN AEROBIC AND ANAEROBIC Blood Culture adequate volume Performed at [facility] [address] [place], [place] [address]    Culture   Final    NO GROWTH 2 DAYS Performed at [facility] [address] [place], [place] [address]    Report Status PENDING  Incomplete  Culture, blood (routine x 2)     Status: None (Preliminary result)   Collection Time: 01/10/18  7:44 PM  Result Value Ref Range Status   Specimen Description   Final    BLOOD LEFT ARM Performed at [facility] [address] [place], [place] [address]    Special  Requests   Final    BOTTLES DRAWN AEROBIC AND ANAEROBIC Blood Culture adequate volume Performed at [facility] [address], [place], [place] [address]    Culture   Final    NO GROWTH 2 DAYS Performed at [facility] [address], [place], [place] [address]    Report Status PENDING  Incomplete  MRSA PCR Screening     Status: Abnormal   Collection Time: 01/11/18  4:24 AM  Result Value Ref Range Status   MRSA by PCR POSITIVE (A) NEGATIVE Final    Comment:        The GeneXpert MRSA Assay (FDA approved for NASAL specimens only), is one component of a comprehensive MRSA colonization surveillance program. It is not intended to diagnose MRSA infection nor to guide or monitor treatment for MRSA infections. RESULT CALLED TO, READ BACK BY AND VERIFIED WITHPennie Rushing [id number] @ 6270 BY J SCOTTON Performed at [facility] [address], [place], [place] [address]   Body fluid culture     Status: None (Preliminary result)   Collection Time: 01/11/18 10:56 AM  Result Value Ref Range Status   Specimen Description   Final    PLEURAL Performed at [facility] [address], [place], [place] [address]    Special Requests   Final    NONE Performed at [facility], [place] [address], [place], [place] [address]    Gram Stain   Final    RARE WBC PRESENT, PREDOMINANTLY MONONUCLEAR NO ORGANISMS SEEN    Culture   Final    NO GROWTH < 24 HOURS Performed at [facility], [place] [address], [place], [place] [address]    Report Status PENDING  Incomplete         Radiology Studies: Ct Chest Wo Contrast  Result Date: 01/10/2018 CLINICAL DATA:  pleural effusion, pleurx drain not functioning EXAM: CT CHEST WITHOUT CONTRAST TECHNIQUE: Multidetector CT imaging of the chest was performed following the  standard protocol without IV contrast. COMPARISON:  Chest CT dated 12/08/2017. FINDINGS:  Cardiovascular: Extensive aortic atherosclerosis. Heart size is within normal limits. Surgical changes of CABG. Mediastinum/Nodes: Scattered small lymph nodes within the mediastinum. Largest lymph node is again seen within the subcarinal space, measuring 1.7 cm short axis dimension, unchanged in the short-term interval. Additional lymph nodes within the mediastinum and perihilar regions were better visualized on the previous study due to IV contrast administration. Esophagus is unremarkable. Trachea appears normal. Lungs/Pleura: RIGHT pleural effusion, moderate to large in size, slightly increased in size compared to the previous exam, with associated atelectasis. Large consolidation within the LEFT lung, involving all lobes but centered at the LEFT lung base, significantly increased in size/extent compared to the previous exam. There is a small loculated appearing pleural effusion along the LEFT lateral chest wall, new compared to the previous exam. Emphysematous changes bilaterally, mild to moderate in degree, upper lobe predominant. Upper Abdomen: Limited images of the upper abdomen are unremarkable. Musculoskeletal: Compression fracture deformity of the T6 vertebral body is stable in the short-term interval. Healing/healed fracture of the LEFT eleventh posterior rib. No new/acute osseous abnormality. IMPRESSION: 1. Large consolidation within the LEFT lung, involving all lobes but centered at the LEFT lung base, significantly increased in size/extent compared to the previous exam, compatible with worsening pneumonia, alternatively severe post radiation change. As discussed on the previous chest CT report, some component of lymphangitic spread of tumor cannot be excluded. 2. RIGHT pleural effusion, moderate to large in size, slightly increased in size compared to the previous exam, with associated atelectasis. 3. Small loculated appearing pleural effusion along the LEFT lateral chest wall, new compared to the  previous exam. Pleural drain extends to the posterior aspect of the LEFT lung base. 4. Emphysematous changes, mild to moderate in degree, upper lobe predominant. 5. Stable compression fracture deformity of the T6 vertebral body. 6. Healing/healed fracture of the LEFT eleventh posterior rib. Aortic Atherosclerosis (ICD10-I70.0) and Emphysema (ICD10-J43.9). Electronically Signed   By: Franki Cabot M.D.   On: 01/10/2018 17:27   Dg Chest Port 1 View  Result Date: 01/11/2018 CLINICAL DATA:  Post right thoracentesis EXAM: PORTABLE CHEST 1 VIEW COMPARISON:  CT chest of 01/10/2018 and chest x-ray of 01/09/2018 FINDINGS: There is a small right apical pneumothorax present after right thoracentesis. Some of the right pleural effusion has been evacuated. Left effusion also remains with bibasilar atelectasis present. Left mid lung opacity is stable. Heart size is stable. Right-sided Port-A-Cath tip overlies the lower SVC. IMPRESSION: 1. Small right apical pneumothorax after right thoracentesis. 2. Some reduction in right pleural effusion after thoracentesis with bilateral pleural effusions and bibasilar atelectasis remaining. Electronically Signed   By: Ivar Drape M.D.   On: 01/11/2018 11:30        Scheduled Meds: . azithromycin  500 mg Oral Q24H  . chlorhexidine  15 mL Mouth Rinse BID  . Chlorhexidine Gluconate Cloth  6 each Topical Q0600  . cholecalciferol  1,000 Units Oral Daily  . feeding supplement (ENSURE ENLIVE)  237 mL Oral BID BM  . gabapentin  400 mg Oral QHS  . insulin aspart  0-9 Units Subcutaneous Q4H  . insulin glargine  16 Units Subcutaneous QHS  . ipratropium-albuterol  3 mL Nebulization Q6H  . mouth rinse  15 mL Mouth Rinse q12n4p  . [START ON 01/13/2018] methylPREDNISolone (SOLU-MEDROL) injection  60 mg Intravenous Daily  . mirtazapine  15 mg Oral QHS  . multivitamin with minerals  1 tablet  Oral Daily  . mupirocin ointment   Nasal BID  . pantoprazole  40 mg Oral Daily  . rosuvastatin   20 mg Oral Daily  . tamsulosin  0.4 mg Oral QHS   Continuous Infusions: . ceFEPime (MAXIPIME) IV 200 mL/hr at 01/12/18 1400  . vancomycin Stopped (01/11/18 1820)     LOS: 2 days    Alma Friendly, MD [facility]   If 7PM-7AM, please contact night-coverage 01/12/2018, 3:44 PM

## 2018-01-12 NOTE — Progress Notes (Signed)
Bipap in room by not indicated at this time.

## 2018-01-12 NOTE — Progress Notes (Signed)
PULMONARY / CRITICAL CARE MEDICINE   NAME:  Steve Frank, MRN:  [id number], DOB:  [birth year], LOS: 2 ADMISSION DATE:  01/09/2018, CONSULTATION DATE: 01/10/2018 REFERRING MD: Dr. Florene Glen, CHIEF COMPLAINT: Shortness of breath  BRIEF HISTORY:    84 year old male with metastatic non-small cell lung cancer bilateral pleural effusion s/p lt pleurx adm with healthcare associate pneumonia and worsening fibrosis of his left lung suspecting related to radiation. Resp failure requiring bipap 12/4   SIGNIFICANT PAST MEDICAL HISTORY   -COPD -CAD status post CABG -Metastatic non-small cell lung cancer status post radiation -Left Pleurx catheter  SIGNIFICANT EVENTS:  Resp failure requiring bipap 12/4  STUDIES:   CT chest 1. Large consolidation within the LEFT lung, involving all lobes but centered at the LEFT lung base, significantly increased in size/extent compared to the previous exam, compatible with worsening pneumonia, alternatively severe post radiation change. As discussed on the previous chest CT report, some component of lymphangitic spread of tumor cannot be excluded. 2. RIGHT pleural effusion, moderate to large in size, slightly increased in size compared to the previous exam, with associated atelectasis. 3. Small loculated appearing pleural effusion along the LEFT lateral chest wall, new compared to the previous exam. Pleural drain extends to the posterior aspect of the LEFT lung base. 4. Emphysematous changes, mild to moderate in degree, upper lobe predominant. 5. Stable compression fracture deformity of the T6 vertebral body. 6. Healing/healed fracture of the LEFT eleventh posterior rib.   CULTURES:    ANTIBIOTICS:  Cefepime Vancomycin Azithromycin  LINES/TUBES:    CONSULTANTS:    SUBJECTIVE:   He looks better today Remains off bipap, on 2 L [name]  CONSTITUTIONAL: BP (!) 111/45   Pulse 74   Temp 98.1 F (36.7 C) (Oral)   Resp (!) 27   Ht 6' (1.829 m)   Wt  64.8 kg   SpO2 98%   BMI 19.38 kg/m   I/O last 3 completed shifts: In: 568.6 [IV Piggyback:568.6] Out: 725 [Urine:725]     FiO2 (%):  [100 %] 100 %  PHYSICAL EXAM: General: Acutely ill , elderly, no distress neuro: alert, interactive, nonfocal HEENT: Dry mucous membranes Cardiovascular: Normal heart sound no sounds or murmurs Lungs: Decrease breath sounds on right, crackles on left  Abdomen: Soft no tenderness no organomegaly Musculoskeletal: No edema Skin: No rash  RESOLVED PROBLEM LIST   ASSESSMENT AND PLAN   Assessment -Acute on chronic hypoxemic respiratory failure requiring bipap -Healthcare associated pneumonia vs Keytruda induced pneumonitis   -ct nasal cannula -Agree with broad-spectrum cefepime/vancomycin -drop Solu-Medrol 60 every 24 - can transition to oral prednisone 40 mg on discharge with slow taper over 2-4 weeks  -Metastatic non-small cell lung cancer  -Bilateral pleural effusion right more than left  -Pleurx on the left is not draining much No more procedures based on overall goals of care  -History of COPD -Duo nebs every 6 hours  Goals of care seem to be no more procedures, will treat left lower lobe pneumonia/pneumonitis with antibiotics and steroids as outlined above, eventually can change to Augmentin on discharge and continue for 10 days. Would recommend home hospice on discharge, if so Pleurx catheter can be discontinued  PCCM available as needed  Kara Mead MD. FCCP. [facility] Pager [phone] If no response call [phone]   01/12/2018

## 2018-01-12 NOTE — Telephone Encounter (Signed)
Pt wife Tana Conch called pt currently in [facility] at [facility] , pt asked wife to call and get MD's recommendations on if he should go hospice or palliative care. Pt wife advised at last visit MD gave option of continue Keytruda or Hospice.  Discussed with Opal to talk w/patient about MD's recommendation to help assist him with this decision as MD is out of office until next week. Opal thanked me for the call,no further concerns.

## 2018-01-13 LAB — BASIC METABOLIC PANEL
Anion gap: 8 (ref 5–15)
BUN: 60 mg/dL — ABNORMAL HIGH (ref 8–23)
CALCIUM: 8.7 mg/dL — AB (ref 8.9–10.3)
CO2: 29 mmol/L (ref 22–32)
Chloride: 105 mmol/L (ref 98–111)
Creatinine, Ser: 1.06 mg/dL (ref 0.61–1.24)
GFR calc Af Amer: >60 mL/min (ref >60)
Glucose, Bld: 115 mg/dL — ABNORMAL HIGH (ref 70–99)
Potassium: 4.1 mmol/L (ref 3.5–5.1)
SODIUM: 142 mmol/L (ref 135–145)

## 2018-01-13 LAB — CBC WITH DIFFERENTIAL/PLATELET
Abs Immature Granulocytes: 0.1 10*3/uL — ABNORMAL HIGH (ref 0.00–0.07)
BASOS PCT: 0 %
Basophils Absolute: 0 10*3/uL (ref 0.0–0.1)
EOS PCT: 0 %
Eosinophils Absolute: 0 10*3/uL (ref 0.0–0.5)
HCT: 27.6 % — ABNORMAL LOW (ref 39.0–52.0)
Hemoglobin: 8.2 g/dL — ABNORMAL LOW (ref 13.0–17.0)
Immature Granulocytes: 1 %
Lymphocytes Relative: 2 %
Lymphs Abs: 0.2 10*3/uL — ABNORMAL LOW (ref 0.7–4.0)
MCH: 28.6 pg (ref 26.0–34.0)
MCHC: 29.7 g/dL — ABNORMAL LOW (ref 30.0–36.0)
MCV: 96.2 fL (ref 80.0–100.0)
Monocytes Absolute: 0.7 10*3/uL (ref 0.1–1.0)
Monocytes Relative: 4 %
NRBC: 0 % (ref 0.0–0.2)
Neutro Abs: 14.5 10*3/uL — ABNORMAL HIGH (ref 1.7–7.7)
Neutrophils Relative %: 93 %
PLATELETS: 213 10*3/uL (ref 150–400)
RBC: 2.87 MIL/uL — ABNORMAL LOW (ref 4.22–5.81)
RDW: 16.2 % — ABNORMAL HIGH (ref 11.5–15.5)
WBC: 15.5 10*3/uL — ABNORMAL HIGH (ref 4.0–10.5)

## 2018-01-13 LAB — URINALYSIS, ROUTINE W REFLEX MICROSCOPIC
Bilirubin Urine: NEGATIVE
Glucose, UA: >=500 mg/dL — AB
Hgb urine dipstick: NEGATIVE
Ketones, ur: NEGATIVE mg/dL
LEUKOCYTES UA: NEGATIVE
Nitrite: NEGATIVE
Protein, ur: NEGATIVE mg/dL
SPECIFIC GRAVITY, URINE: 1.017 (ref 1.005–1.030)
Waxy Casts, UA: 4
pH: 5 (ref 5.0–8.0)

## 2018-01-13 LAB — GLUCOSE, CAPILLARY
Glucose-Capillary: 103 mg/dL — ABNORMAL HIGH (ref 70–99)
Glucose-Capillary: 118 mg/dL — ABNORMAL HIGH (ref 70–99)
Glucose-Capillary: 305 mg/dL — ABNORMAL HIGH (ref 70–99)
Glucose-Capillary: 293 mg/dL — ABNORMAL HIGH (ref 70–99)
Glucose-Capillary: 361 mg/dL — ABNORMAL HIGH (ref 70–99)

## 2018-01-13 NOTE — Progress Notes (Signed)
Called pt's spouse and updated her on pt's new room number, also called report to RN on [place]

## 2018-01-13 NOTE — Progress Notes (Signed)
Bipap not indicated at this time. 

## 2018-01-13 NOTE — Progress Notes (Signed)
PROGRESS NOTE    Steve Frank  VQM:[id number] DOB: 06-Jul-1931 DOA: 01/09/2018 PCP: [name], MD   Brief Narrative:  Steve Frank is a 84 y.o. male with history of metastatic non-small cell lung cancer, CAD status post CABG, COPD presently on Keytruda being followed by Dr. Julien Nordmann, oncologist presents with worsening shortness of breath over the last 2 weeks.  As per the oncology notes patient has chronic shortness of breath secondary to pleural effusion and radiation pneumonitis.  Patient states his shortness of breath is acutely worsened and he has left-sided Pleurx catheter which has stopped draining for last 2 weeks gradually.  Patient also has been having some productive cough but denies any fever or chills.  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 Acute on chronic hypoxic respiratory failure with hypoxia likely from CAP Vs ?malignant pleural effusion Vs Pneumonitis from immunotherapy  Remains on [place] supplemental O2  Afebrile with leukocytosis (steroid induced) Pleurx catheter not draining as L pleural effusion has resolved, currently with R sided effusion as per CT chest with Large L lung consolidation PCCM consulted s/p R thoracenetsis, body fluid culture NGTD Continue cefepime, azithromycin, vancomycin Palliative consulted for [place]- Family yet to make decision for possible home hospice vs palliative  2. COPD Management as above Continue solumedrol, duonebs  3. Diabetes mellitus type 2 Continue Lantus insulin and sliding scale coverage.  4. Anemia likely from malignancy  Daily CBC  5. Metastatic non-small cell lung cancer being followed by Dr. Julien Nordmann.   DVT prophylaxis:  Lovenox Code Status: DNR Family Communication: Spoke to wife over the phone Disposition Plan: TBD, awaiting PT/OT evalaution   Consultants:  IR PCCM Palliative team  Procedures:   R sided thoracentesis on 01/11/18   Antimicrobials:  Anti-infectives (From admission, onward)   Start     Dose/Rate Route Frequency Ordered Stop   01/11/18 1600  vancomycin (VANCOCIN) IVPB 1000 mg/200 mL premix     1,000 mg 200 mL/hr over 60 Minutes Intravenous Every 24 hours 01/11/18 1028     01/11/18 1200  vancomycin (VANCOCIN) 1,250 mg in sodium chloride 0.9 % 250 mL IVPB  Status:  Discontinued     1,250 mg 166.7 mL/hr over 90 Minutes Intravenous Every 24 hours 01/10/18 2050 01/11/18 1028   01/11/18 1000  azithromycin (ZITHROMAX) tablet 500 mg  Status:  Discontinued     500 mg Oral Every 24 hours 01/10/18 1510 01/10/18 1921   01/11/18 1000  cefTRIAXone (ROCEPHIN) 1 g in sodium chloride 0.9 % 100 mL IVPB  Status:  Discontinued     1 g 200 mL/hr over 30 Minutes Intravenous Every 24 hours 01/10/18 1516 01/10/18 1921   01/11/18 1000  azithromycin (ZITHROMAX) tablet 500 mg     500 mg Oral Every 24 hours 01/10/18 1924 01/18/18 0959   01/11/18 0400  ceFEPIme (MAXIPIME) 1 g in sodium chloride 0.9 % 100 mL IVPB     1 g 200 mL/hr over 30 Minutes Intravenous Every 8 hours 01/10/18 2050     01/10/18 2000  vancomycin (VANCOCIN) 1,250 mg in sodium chloride 0.9 % 250 mL IVPB     1,250 mg 166.7 mL/hr over 90 Minutes Intravenous  Once 01/10/18  1856 01/10/18 2113   01/10/18 1930  ceFEPIme (MAXIPIME) 2 g in sodium chloride 0.9 % 100 mL IVPB     2 g 200 mL/hr over 30 Minutes Intravenous STAT 01/10/18 1923 01/10/18 2011   01/10/18 1600  cefTRIAXone (ROCEPHIN) 1 g in sodium chloride 0.9 % 100 mL IVPB  Status:  Discontinued     1 g 200 mL/hr over 30 Minutes Intravenous Every 24 hours 01/10/18 1510 01/10/18 1516   01/10/18 0615  levofloxacin (LEVAQUIN) IVPB 750 mg  Status:  Discontinued     750 mg 100 mL/hr over 90  Minutes Intravenous Daily 01/10/18 0600 01/10/18 1508     Subjective: Pt denies any new complaints  Objective: Vitals:   01/13/18 1200 01/13/18 1400 01/13/18 1406 01/13/18 1600  BP: (!) 124/47 (!) 127/47  (!) 157/58  Pulse: 78 77  78  Resp: (!) 24 (!) 28  (!) 21  Temp:      TempSrc:      SpO2: 95% 96% 96% 96%  Weight:      Height:        Intake/Output Summary (Last 24 hours) at 01/13/2018 1654 Last data filed at 01/13/2018 1638 Gross per 24 hour  Intake 1094.29 ml  Output 1300 ml  Net -205.71 ml   Filed Weights   01/11/18 0331 01/12/18 0500 01/13/18 0358  Weight: 64.4 kg 64.8 kg 66.2 kg    Examination:  General: NAD   Cardiovascular: S1, S2 present  Respiratory: Coarse BS b/l  Abdomen: Soft, nontender, nondistended, bowel sounds present  Musculoskeletal: No bilateral pedal edema noted  Skin: Normal  Psychiatry: Normal mood   Data Reviewed: I have personally reviewed following labs and imaging studies  CBC: Recent Labs  Lab 01/09/18 1648 01/10/18 0316 01/10/18 1944 01/11/18 0345 01/12/18 0500 01/13/18 0330  WBC 10.7* 10.9* 13.6* 18.7* 17.3* 15.5*  NEUTROABS 9.2*  --   --   --  16.6* 14.5*  HGB 9.4* 9.7* 10.1* 10.0* 8.4* 8.2*  HCT 30.7* 31.8* 32.6* 33.0* 27.6* 27.6*  MCV 96.8 97.0 94.2 97.9 97.5 96.2  PLT 218 202 221 177 176 659   Basic Metabolic Panel: Recent Labs  Lab 01/10/18 0316 01/10/18 1944 01/11/18 0345 01/12/18 0500 01/13/18 0330  NA 140 136 138 140 142  K 3.8 4.0 4.2 4.4 4.1  CL 101 98 102 102 105  CO2 29 28 29 28 29   GLUCOSE 180* 227* 215* 208* 115*  BUN 19 19 26* 49* 60*  CREATININE 0.69 0.79 0.81 1.01 1.06  CALCIUM 8.4* 8.3* 8.6* 8.7* 8.7*  MG  --   --  1.8  --   --    GFR: Estimated Creatinine Clearance: 48.6 mL/min (by C-G formula based on SCr of 1.06 mg/dL). Liver Function Tests: Recent Labs  Lab 01/11/18 0345  AST 16  ALT 16  ALKPHOS 51  BILITOT 0.6  PROT 6.3*  ALBUMIN 2.4*   No results for input(s): LIPASE,  AMYLASE in the last 168 hours. No results for input(s): AMMONIA in the last 168 hours. Coagulation Profile: No results for input(s): INR, PROTIME in the last 168 hours. Cardiac Enzymes: No results for input(s): CKTOTAL, CKMB, CKMBINDEX, TROPONINI in the last 168 hours. BNP (last 3 results) No results for input(s): PROBNP in the last 8760 hours. HbA1C: No results for input(s): HGBA1C in the last 72 hours. CBG: Recent Labs  Lab 01/12/18 2347 01/13/18 0348 01/13/18 0756 01/13/18 1215 01/13/18 1605  GLUCAP 193* 103* 118* 305* 293*   Lipid  Profile: No results for input(s): CHOL, HDL, LDLCALC, TRIG, CHOLHDL, LDLDIRECT in the last 72 hours. Thyroid Function Tests: No results for input(s): TSH, T4TOTAL, FREET4, T3FREE, THYROIDAB in the last 72 hours. Anemia Panel: No results for input(s): VITAMINB12, FOLATE, FERRITIN, TIBC, IRON, RETICCTPCT in the last 72 hours. Sepsis Labs: Recent Labs  Lab 01/10/18 1944 01/11/18 0003  LATICACIDVEN 1.0 0.7    Recent Results (from the past 240 hour(s))  Culture, blood (routine x 2)     Status: None (Preliminary result)   Collection Time: 01/10/18  7:44 PM  Result Value Ref Range Status   Specimen Description   Final    BLOOD RIGHT ARM Performed at [facility] [address], [place], [place] [address]    Special Requests   Final    BOTTLES DRAWN AEROBIC AND ANAEROBIC Blood Culture adequate volume Performed at [facility] [address] [place], [place] [address]    Culture   Final    NO GROWTH 3 DAYS Performed at [facility], [place] [address], [place], [place] [address]    Report Status PENDING  Incomplete  Culture, blood (routine x 2)     Status: None (Preliminary result)   Collection Time: 01/10/18  7:44 PM  Result Value Ref Range Status   Specimen Description   Final    BLOOD LEFT ARM Performed at [facility] [address], [place], [place] [address]    Special  Requests   Final    BOTTLES DRAWN AEROBIC AND ANAEROBIC Blood Culture adequate volume Performed at [facility] [address] [place], [place] [address]    Culture   Final    NO GROWTH 3 DAYS Performed at [facility], [place] [address], [place], [place] [address]    Report Status PENDING  Incomplete  MRSA PCR Screening     Status: Abnormal   Collection Time: 01/11/18  4:24 AM  Result Value Ref Range Status   MRSA by PCR POSITIVE (A) NEGATIVE Final    Comment:        The GeneXpert MRSA Assay (FDA approved for NASAL specimens only), is one component of a comprehensive MRSA colonization surveillance program. It is not intended to diagnose MRSA infection nor to guide or monitor treatment for MRSA infections. RESULT CALLED TO, READ BACK BY AND VERIFIED WITHPennie Rushing [id number] @ 7893 BY J SCOTTON Performed at [facility] [address], [place], [place] [address]   Body fluid culture     Status: None (Preliminary result)   Collection Time: 01/11/18 10:56 AM  Result Value Ref Range Status   Specimen Description   Final    PLEURAL Performed at [facility] [address] [place], [place] [address]    Special Requests   Final    NONE Performed at [facility], [place] [address], [place], [place] [address]    Gram Stain   Final    RARE WBC PRESENT, PREDOMINANTLY MONONUCLEAR NO ORGANISMS SEEN    Culture   Final    NO GROWTH 2 DAYS Performed at [facility], [place] [address], [place], [place] [address]    Report Status PENDING  Incomplete         Radiology Studies: No results found.      Scheduled Meds: . azithromycin  500 mg Oral Q24H  . chlorhexidine  15 mL Mouth Rinse BID  . Chlorhexidine Gluconate Cloth  6 each Topical Q0600  . cholecalciferol  1,000 Units Oral Daily  . enoxaparin (LOVENOX) injection  40 mg Subcutaneous Q24H  . feeding supplement (ENSURE ENLIVE)  237 mL  Oral BID BM  . gabapentin  400 mg Oral QHS  . insulin aspart  0-9 Units Subcutaneous Q4H  . insulin glargine  16 Units Subcutaneous QHS  . ipratropium-albuterol  3 mL Nebulization Q6H  . mouth rinse  15 mL Mouth Rinse q12n4p  . methylPREDNISolone (SOLU-MEDROL) injection  60 mg Intravenous Daily  . mirtazapine  15 mg Oral QHS  . multivitamin with minerals  1 tablet Oral Daily  . mupirocin ointment   Nasal BID  . pantoprazole  40 mg Oral Daily  . rosuvastatin  20 mg Oral Daily  . tamsulosin  0.4 mg Oral QHS   Continuous Infusions: . ceFEPime (MAXIPIME) IV Stopped (01/13/18 1142)  . vancomycin 200 mL/hr at 01/13/18 1600     LOS: 3 days    Steve Friendly, MD [facility]   If 7PM-7AM, please contact night-coverage 01/13/2018, 4:54 PM

## 2018-01-14 LAB — GLUCOSE, CAPILLARY
Glucose-Capillary: 122 mg/dL — ABNORMAL HIGH (ref 70–99)
Glucose-Capillary: 156 mg/dL — ABNORMAL HIGH (ref 70–99)
Glucose-Capillary: 165 mg/dL — ABNORMAL HIGH (ref 70–99)
Glucose-Capillary: 187 mg/dL — ABNORMAL HIGH (ref 70–99)
Glucose-Capillary: 232 mg/dL — ABNORMAL HIGH (ref 70–99)
Glucose-Capillary: 372 mg/dL — ABNORMAL HIGH (ref 70–99)

## 2018-01-14 LAB — CBC WITH DIFFERENTIAL/PLATELET
Abs Immature Granulocytes: 0.1 10*3/uL — ABNORMAL HIGH (ref 0.00–0.07)
Basophils Absolute: 0 10*3/uL (ref 0.0–0.1)
Basophils Relative: 0 %
EOS PCT: 0 %
Eosinophils Absolute: 0 10*3/uL (ref 0.0–0.5)
HEMATOCRIT: 25.4 % — AB (ref 39.0–52.0)
Hemoglobin: 7.7 g/dL — ABNORMAL LOW (ref 13.0–17.0)
Immature Granulocytes: 1 %
Lymphocytes Relative: 3 %
Lymphs Abs: 0.3 10*3/uL — ABNORMAL LOW (ref 0.7–4.0)
MCH: 29.7 pg (ref 26.0–34.0)
MCHC: 30.3 g/dL (ref 30.0–36.0)
MCV: 98.1 fL (ref 80.0–100.0)
Monocytes Absolute: 0.7 10*3/uL (ref 0.1–1.0)
Monocytes Relative: 5 %
Neutro Abs: 10.9 10*3/uL — ABNORMAL HIGH (ref 1.7–7.7)
Neutrophils Relative %: 91 %
Platelets: 180 10*3/uL (ref 150–400)
RBC: 2.59 MIL/uL — ABNORMAL LOW (ref 4.22–5.81)
RDW: 16 % — ABNORMAL HIGH (ref 11.5–15.5)
WBC: 12 10*3/uL — ABNORMAL HIGH (ref 4.0–10.5)
nRBC: 0 % (ref 0.0–0.2)

## 2018-01-14 LAB — BASIC METABOLIC PANEL
Anion gap: 4 — ABNORMAL LOW (ref 5–15)
BUN: 51 mg/dL — ABNORMAL HIGH (ref 8–23)
CO2: 32 mmol/L (ref 22–32)
Calcium: 8.4 mg/dL — ABNORMAL LOW (ref 8.9–10.3)
Chloride: 105 mmol/L (ref 98–111)
Creatinine, Ser: 0.93 mg/dL (ref 0.61–1.24)
GFR calc Af Amer: >60 mL/min (ref >60)
GFR calc non Af Amer: >60 mL/min (ref >60)
Glucose, Bld: 173 mg/dL — ABNORMAL HIGH (ref 70–99)
Potassium: 4.2 mmol/L (ref 3.5–5.1)
Sodium: 141 mmol/L (ref 135–145)

## 2018-01-14 LAB — BLOOD CULTURE ID PANEL (REFLEXED)
Acinetobacter baumannii: NOT DETECTED
Candida albicans: NOT DETECTED
Candida glabrata: NOT DETECTED
Candida krusei: NOT DETECTED
Candida parapsilosis: NOT DETECTED
Candida tropicalis: NOT DETECTED
ENTEROCOCCUS SPECIES: NOT DETECTED
ESCHERICHIA COLI: NOT DETECTED
Enterobacter cloacae complex: NOT DETECTED
Enterobacteriaceae species: NOT DETECTED
Haemophilus influenzae: NOT DETECTED
Klebsiella oxytoca: NOT DETECTED
Klebsiella pneumoniae: NOT DETECTED
LISTERIA MONOCYTOGENES: NOT DETECTED
Neisseria meningitidis: NOT DETECTED
Proteus species: NOT DETECTED
Pseudomonas aeruginosa: NOT DETECTED
SERRATIA MARCESCENS: NOT DETECTED
STREPTOCOCCUS PYOGENES: NOT DETECTED
Staphylococcus aureus (BCID): NOT DETECTED
Staphylococcus species: NOT DETECTED
Streptococcus agalactiae: NOT DETECTED
Streptococcus pneumoniae: NOT DETECTED
Streptococcus species: NOT DETECTED

## 2018-01-14 LAB — BODY FLUID CULTURE: Culture: NO GROWTH

## 2018-01-14 MED ORDER — ALBUTEROL SULFATE (2.5 MG/3ML) 0.083% IN NEBU: 2.5000 mg | INHALATION_SOLUTION | RESPIRATORY_TRACT | Status: DC | PRN

## 2018-01-14 MED ORDER — IPRATROPIUM-ALBUTEROL 0.5-2.5 (3) MG/3ML IN SOLN
3.0000 mL | Freq: Three times a day (TID) | RESPIRATORY_TRACT | Status: DC
  Administered 2018-01-14 – 2018-01-15 (×4): 3 mL via RESPIRATORY_TRACT
  Filled 2018-01-14 (×4): qty 3

## 2018-01-14 MED ORDER — INSULIN ASPART 100 UNIT/ML ~~LOC~~ SOLN
0.0000 [IU] | Freq: Three times a day (TID) | SUBCUTANEOUS | Status: DC
  Administered 2018-01-14 – 2018-01-15 (×2): 9 [IU] via SUBCUTANEOUS
  Administered 2018-01-15 – 2018-01-16 (×2): 2 [IU] via SUBCUTANEOUS
  Administered 2018-01-16: 5 [IU] via SUBCUTANEOUS
  Administered 2018-01-17: 2 [IU] via SUBCUTANEOUS

## 2018-01-14 MED ORDER — INSULIN ASPART 100 UNIT/ML ~~LOC~~ SOLN
5.0000 [IU] | Freq: Three times a day (TID) | SUBCUTANEOUS | Status: DC
  Administered 2018-01-14 – 2018-01-17 (×6): 5 [IU] via SUBCUTANEOUS

## 2018-01-14 MED ORDER — PREDNISONE 20 MG PO TABS
40.0000 mg | ORAL_TABLET | Freq: Every day | ORAL | Status: DC
  Administered 2018-01-14 – 2018-01-17 (×4): 40 mg via ORAL
  Filled 2018-01-14 (×5): qty 2

## 2018-01-14 MED ORDER — INSULIN ASPART 100 UNIT/ML ~~LOC~~ SOLN
0.0000 [IU] | Freq: Every day | SUBCUTANEOUS | Status: DC
  Administered 2018-01-14: 2 [IU] via SUBCUTANEOUS

## 2018-01-14 NOTE — Progress Notes (Signed)
PROGRESS NOTE    Steve Frank  PNT:[id number] DOB: 05/20/1931 DOA: 01/09/2018 PCP: [name], MD   Brief Narrative:  Steve Frank is a 84 y.o. male with history of metastatic non-small cell lung cancer, CAD status post CABG, COPD presently on Keytruda being followed by Dr. Julien Nordmann, oncologist presents with worsening shortness of breath over the last 2 weeks.  As per the oncology notes patient has chronic shortness of breath secondary to pleural effusion and radiation pneumonitis.  Patient states his shortness of breath is acutely worsened and he has left-sided Pleurx catheter which has stopped draining for last 2 weeks gradually.  Patient also has been having some productive cough but denies any fever or chills.  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)   Pleural effusion, bilateral   Palliative care by specialist  Acute on chronic hypoxic respiratory failure with hypoxia likely from CAP Vs ?malignant pleural effusion Vs Pneumonitis from immunotherapy  Remains on [place] supplemental O2  Afebrile with leukocytosis (steroid induced) Pleurx catheter not draining as L pleural effusion has resolved, currently with R sided effusion as per CT chest with Large L lung consolidation PCCM consulted s/p R thoracenetsis, body fluid culture NGTD Continue cefepime, azithromycin, d/c vancomycin on 01/14/18 Palliative consulted for [place]- Family yet to make decision for possible home hospice vs palliative  ?Gram negative bacteremia Afebrile, with leukocytosis (steroid induced) BC X 1, aerobic bottle growing gram -rods Awaiting final culture report. Repeat BC X 2 pending Continue IV AB  ?UTI C/O dysuria on 01/13/18 UA neg for infection (unlikely as pt as been on AB) UC  pending  COPD Management as above Continue solumedrol, duonebs  Diabetes mellitus type 2 Continue Lantus insulin and sliding scale coverage.  Anemia likely from malignancy  Daily CBC  Metastatic non-small cell lung cancer Followed by Dr. Julien Nordmann.   DVT prophylaxis: Lovenox Code Status: DNR Family Communication: None at bedside Disposition Plan: TBD, awaiting PT/OT evalaution   Consultants:  IR PCCM Palliative team  Procedures:   R sided thoracentesis on 01/11/18   Antimicrobials:  Anti-infectives (From admission, onward)   Start     Dose/Rate Route Frequency Ordered Stop   01/11/18 1600  vancomycin (VANCOCIN) IVPB 1000 mg/200 mL premix  Status:  Discontinued     1,000 mg 200 mL/hr over 60 Minutes Intravenous Every 24 hours 01/11/18 1028 01/14/18 1437   01/11/18 1200  vancomycin (VANCOCIN) 1,250 mg in sodium chloride 0.9 % 250 mL IVPB  Status:  Discontinued     1,250 mg 166.7 mL/hr over 90 Minutes Intravenous Every 24 hours 01/10/18 2050 01/11/18 1028   01/11/18 1000  azithromycin (ZITHROMAX) tablet 500 mg  Status:  Discontinued     500 mg Oral Every 24 hours 01/10/18 1510 01/10/18 1921   01/11/18 1000  cefTRIAXone (ROCEPHIN) 1 g in sodium chloride 0.9 % 100 mL IVPB  Status:  Discontinued     1 g 200 mL/hr over 30 Minutes Intravenous Every 24 hours 01/10/18 1516 01/10/18 1921   01/11/18 1000  azithromycin (ZITHROMAX) tablet 500 mg     500 mg Oral Every 24 hours 01/10/18 1924 01/18/18 0959   01/11/18 0400  ceFEPIme (MAXIPIME) 1 g in sodium chloride 0.9 % 100 mL IVPB     1 g  200 mL/hr over 30 Minutes Intravenous Every 8 hours 01/10/18 2050     01/10/18 2000  vancomycin (VANCOCIN) 1,250 mg in sodium chloride 0.9 % 250 mL IVPB     1,250 mg 166.7 mL/hr over 90 Minutes Intravenous  Once 01/10/18 1856 01/10/18 2113   01/10/18 1930  ceFEPIme (MAXIPIME) 2 g in sodium chloride 0.9 % 100 mL IVPB     2 g 200 mL/hr over 30 Minutes Intravenous STAT 01/10/18 1923 01/10/18 2011    01/10/18 1600  cefTRIAXone (ROCEPHIN) 1 g in sodium chloride 0.9 % 100 mL IVPB  Status:  Discontinued     1 g 200 mL/hr over 30 Minutes Intravenous Every 24 hours 01/10/18 1510 01/10/18 1516   01/10/18 0615  levofloxacin (LEVAQUIN) IVPB 750 mg  Status:  Discontinued     750 mg 100 mL/hr over 90 Minutes Intravenous Daily 01/10/18 0600 01/10/18 1508     Subjective: Pt denies any new complaints.  Objective: Vitals:   01/13/18 2311 01/14/18 0633 01/14/18 1018 01/14/18 1409  BP: (!) 159/72 (!) 152/77  (!) 148/80  Pulse: 74 84  79  Resp: 18 18  (!) 22  Temp: 97.7 F (36.5 C) 97.7 F (36.5 C)  98 F (36.7 C)  TempSrc: Oral Oral  Oral  SpO2: 98% 95% 92% 96%  Weight:      Height:        Intake/Output Summary (Last 24 hours) at 01/14/2018 1438 Last data filed at 01/14/2018 1410 Gross per 24 hour  Intake 1019.87 ml  Output 1050 ml  Net -30.13 ml   Filed Weights   01/11/18 0331 01/12/18 0500 01/13/18 0358  Weight: 64.4 kg 64.8 kg 66.2 kg    Examination:  General: NAD   Cardiovascular: S1, S2 present  Respiratory: Coarse BS b/l  Abdomen: Soft, nontender, nondistended, bowel sounds present  Musculoskeletal: No bilateral pedal edema noted  Skin: Normal  Psychiatry: Normal mood   Data Reviewed: I have personally reviewed following labs and imaging studies  CBC: Recent Labs  Lab 01/09/18 1648  01/10/18 1944 01/11/18 0345 01/12/18 0500 01/13/18 0330 01/14/18 0339  WBC 10.7*   < > 13.6* 18.7* 17.3* 15.5* 12.0*  NEUTROABS 9.2*  --   --   --  16.6* 14.5* 10.9*  HGB 9.4*   < > 10.1* 10.0* 8.4* 8.2* 7.7*  HCT 30.7*   < > 32.6* 33.0* 27.6* 27.6* 25.4*  MCV 96.8   < > 94.2 97.9 97.5 96.2 98.1  PLT 218   < > 221 177 176 213 180   < > = values in this interval not displayed.   Basic Metabolic Panel: Recent Labs  Lab 01/10/18 1944 01/11/18 0345 01/12/18 0500 01/13/18 0330 01/14/18 0339  NA 136 138 140 142 141  K 4.0 4.2 4.4 4.1 4.2  CL 98 102 102 105 105   CO2 28 29 28 29  32  GLUCOSE 227* 215* 208* 115* 173*  BUN 19 26* 49* 60* 51*  CREATININE 0.79 0.81 1.01 1.06 0.93  CALCIUM 8.3* 8.6* 8.7* 8.7* 8.4*  MG  --  1.8  --   --   --    GFR: Estimated Creatinine Clearance: 55.4 mL/min (by C-G formula based on SCr of 0.93 mg/dL). Liver Function Tests: Recent Labs  Lab 01/11/18 0345  AST 16  ALT 16  ALKPHOS 51  BILITOT 0.6  PROT 6.3*  ALBUMIN 2.4*   No results for input(s): LIPASE, AMYLASE in the last 168 hours. No results for  input(s): AMMONIA in the last 168 hours. Coagulation Profile: No results for input(s): INR, PROTIME in the last 168 hours. Cardiac Enzymes: No results for input(s): CKTOTAL, CKMB, CKMBINDEX, TROPONINI in the last 168 hours. BNP (last 3 results) No results for input(s): PROBNP in the last 8760 hours. HbA1C: No results for input(s): HGBA1C in the last 72 hours. CBG: Recent Labs  Lab 01/13/18 2019 01/14/18 0056 01/14/18 0539 01/14/18 0737 01/14/18 1138  GLUCAP 361* 187* 156* 122* 165*   Lipid Profile: No results for input(s): CHOL, HDL, LDLCALC, TRIG, CHOLHDL, LDLDIRECT in the last 72 hours. Thyroid Function Tests: No results for input(s): TSH, T4TOTAL, FREET4, T3FREE, THYROIDAB in the last 72 hours. Anemia Panel: No results for input(s): VITAMINB12, FOLATE, FERRITIN, TIBC, IRON, RETICCTPCT in the last 72 hours. Sepsis Labs: Recent Labs  Lab 01/10/18 1944 01/11/18 0003  LATICACIDVEN 1.0 0.7    Recent Results (from the past 240 hour(s))  Culture, blood (routine x 2)     Status: None (Preliminary result)   Collection Time: 01/10/18  7:44 PM  Result Value Ref Range Status   Specimen Description   Final    BLOOD RIGHT ARM Performed at [facility] [address], [place], [place] [address]    Special Requests   Final    BOTTLES DRAWN AEROBIC AND ANAEROBIC Blood Culture adequate volume Performed at [facility] [address] [place], [place] [address]     Culture   Final    NO GROWTH 3 DAYS Performed at [facility], [place] [address] [place], [place] [address]    Report Status PENDING  Incomplete  Culture, blood (routine x 2)     Status: None (Preliminary result)   Collection Time: 01/10/18  7:44 PM  Result Value Ref Range Status   Specimen Description   Final    BLOOD LEFT ARM Performed at [facility] [address], [place], [place] [address]    Special Requests   Final    BOTTLES DRAWN AEROBIC AND ANAEROBIC Blood Culture adequate volume Performed at [facility] [address] [place], [place] [address]    Culture  Setup Time   Final    GRAM NEGATIVE RODS AEROBIC BOTTLE ONLY CRITICAL RESULT CALLED TO, READ BACK BY AND VERIFIED WITH: L. POINDEXTER,PHARMD 0211 01/14/2018 T. TYSOR    Culture   Final    GRAM NEGATIVE RODS CULTURE REINCUBATED FOR BETTER GROWTH Performed at [facility], [place] [address] [place], [place] [address]    Report Status PENDING  Incomplete  Blood Culture ID Panel (Reflexed)     Status: None   Collection Time: 01/10/18  7:44 PM  Result Value Ref Range Status   Enterococcus species NOT DETECTED NOT DETECTED Final   Listeria monocytogenes NOT DETECTED NOT DETECTED Final   Staphylococcus species NOT DETECTED NOT DETECTED Final   Staphylococcus aureus (BCID) NOT DETECTED NOT DETECTED Final   Streptococcus species NOT DETECTED NOT DETECTED Final   Streptococcus agalactiae NOT DETECTED NOT DETECTED Final   Streptococcus pneumoniae NOT DETECTED NOT DETECTED Final   Streptococcus pyogenes NOT DETECTED NOT DETECTED Final   Acinetobacter baumannii NOT DETECTED NOT DETECTED Final   Enterobacteriaceae species NOT DETECTED NOT DETECTED Final   Enterobacter cloacae complex NOT DETECTED NOT DETECTED Final   Escherichia coli NOT DETECTED NOT DETECTED Final   Klebsiella oxytoca NOT DETECTED NOT DETECTED Final   Klebsiella pneumoniae NOT DETECTED NOT DETECTED Final  Proteus species NOT DETECTED NOT DETECTED Final   Serratia marcescens NOT DETECTED NOT DETECTED Final   Haemophilus influenzae NOT DETECTED NOT DETECTED Final   Neisseria meningitidis NOT DETECTED NOT DETECTED Final   Pseudomonas aeruginosa NOT DETECTED NOT DETECTED Final   Candida albicans NOT DETECTED NOT DETECTED Final   Candida glabrata NOT DETECTED NOT DETECTED Final   Candida krusei NOT DETECTED NOT DETECTED Final   Candida parapsilosis NOT DETECTED NOT DETECTED Final   Candida tropicalis NOT DETECTED NOT DETECTED Final    Comment: Performed at [facility], [place] [address] [place], [place] [address]  MRSA PCR Screening     Status: Abnormal   Collection Time: 01/11/18  4:24 AM  Result Value Ref Range Status   MRSA by PCR POSITIVE (A) NEGATIVE Final    Comment:        The GeneXpert MRSA Assay (FDA approved for NASAL specimens only), is one component of a comprehensive MRSA colonization surveillance program. It is not intended to diagnose MRSA infection nor to guide or monitor treatment for MRSA infections. RESULT CALLED TO, READ BACK BY AND VERIFIED WITHPennie Rushing [id number] @ 1779 BY J SCOTTON Performed at [facility] [address], [place], [place] [address]   Body fluid culture     Status: None   Collection Time: 01/11/18 10:56 AM  Result Value Ref Range Status   Specimen Description   Final    PLEURAL Performed at [facility] [address] [place], [place] [address]    Special Requests   Final    NONE Performed at [facility], [place] [address], [place], [place] [address]    Gram Stain   Final    RARE WBC PRESENT, PREDOMINANTLY MONONUCLEAR NO ORGANISMS SEEN    Culture   Final    NO GROWTH 3 DAYS Performed at [facility], [place] [address], [place], [place] [address]    Report Status 01/14/2018 FINAL  Final         Radiology Studies: No results found.      Scheduled  Meds: . azithromycin  500 mg Oral Q24H  . chlorhexidine  15 mL Mouth Rinse BID  . Chlorhexidine Gluconate Cloth  6 each Topical Q0600  . cholecalciferol  1,000 Units Oral Daily  . enoxaparin (LOVENOX) injection  40 mg Subcutaneous Q24H  . feeding supplement (ENSURE ENLIVE)  237 mL Oral BID BM  . gabapentin  400 mg Oral QHS  . insulin aspart  0-9 Units Subcutaneous Q4H  . insulin glargine  16 Units Subcutaneous QHS  . ipratropium-albuterol  3 mL Nebulization TID  . mouth rinse  15 mL Mouth Rinse q12n4p  . mirtazapine  15 mg Oral QHS  . multivitamin with minerals  1 tablet Oral Daily  . mupirocin ointment   Nasal BID  . pantoprazole  40 mg Oral Daily  . predniSONE  40 mg Oral Q breakfast  . rosuvastatin  20 mg Oral Daily  . tamsulosin  0.4 mg Oral QHS   Continuous Infusions: . ceFEPime (MAXIPIME) IV 1 g (01/14/18 1210)     LOS: 4 days    Alma Friendly, MD [facility]   If 7PM-7AM, please contact night-coverage 01/14/2018, 2:38 PM

## 2018-01-14 NOTE — Evaluation (Signed)
Physical Therapy Evaluation Patient Details Name: Steve Frank MRN: [id number] DOB: 17-Feb-1931 Today's Date: 01/14/2018   History of Present Illness  Steve Frank is a 84 y.o. male with history of metastatic non-small cell lung cancer, CAD status post CABG, COPD presently on Keytruda being followed by Dr. Julien Nordmann, oncologist presents with worsening shortness of breath over the last 2 weeks.  As per the oncology notes patient has chronic shortness of breath secondary to pleural effusion and radiation pneumonitis.  Patient states his shortness of breath is acutely worsened and he has left-sided Pleurx catheter which has stopped draining for last 2 weeks gradually  Clinical Impression  Pt did participate with PT today, very labored with any activity even coming to sitting EOB with rest breaks to catch his breath. Pt presetns with weakness and decreased ROM with decreased mobility as well. Unsure of how wife is managing or assisting at home, so at this time recommending ST-SNF to assist pt in strengthening and mobility before returning home.     Follow Up Recommendations SNF(wife was not present so unsure if he has extra help at home to assit with pt's mobility and needs )    Equipment Recommendations  None recommended by PT    Recommendations for Other Services       Precautions / Restrictions Precautions Precautions: None Restrictions Weight Bearing Restrictions: No      Mobility  Bed Mobility Overal bed mobility: Needs Assistance Bed Mobility: Supine to Sit;Sit to Supine     Supine to sit: Mod assist     General bed mobility comments: cues and assist to scoot and sequencing for task   Transfers Overall transfer level: Needs assistance Equipment used: Rolling walker (2 wheeled) Transfers: Sit to/from Stand Sit to Stand: +2 safety/equipment;Mod assist;+2 physical assistance         General transfer comment: to stand and small steps to pivot to recliner    Ambulation/Gait Ambulation/Gait assistance: Mod assist;+2 safety/equipment;+2 physical assistance Gait Distance (Feet): 7 Feet Assistive device: Rolling walker (2 wheeled)       General Gait Details: small festinating steps to move from bed to recliner with a few steps   Stairs            Wheelchair Mobility    Modified Rankin (Stroke Patients Only)       Balance Overall balance assessment: Needs assistance                                           Pertinent Vitals/Pain Pain Assessment: Faces Faces Pain Scale: No hurt    Home Living Family/patient expects to be discharged to:: Unsure Living Arrangements: Spouse/significant other                    Prior Function Level of Independence: Independent with assistive device(s)         Comments: Pt's histaory is unclear, states he doesn't wa;k, but he states he uses a RW in the house a little. Previous charting states he has had multiple falls, and uses RW in home and WC at times as well.      Hand Dominance        Extremity/Trunk Assessment        Lower Extremity Assessment Lower Extremity Assessment: Generalized weakness(BLE very weak and noted B knee flexion contractures grossly 20 degrees from full extension )  Communication   Communication: No difficulties(pt is very witty and humorous , but unclear of accuracy of history and PLOF. )  Cognition Arousal/Alertness: Awake/alert Behavior During Therapy: WFL for tasks assessed/performed Overall Cognitive Status: No family/caregiver present to determine baseline cognitive functioning                                        General Comments      Exercises     Assessment/Plan    PT Assessment Patient needs continued PT services  PT Problem List Decreased strength;Decreased activity tolerance;Decreased range of motion;Decreased balance;Decreased mobility       PT Treatment Interventions Gait  training;Functional mobility training;Therapeutic activities;Therapeutic exercise;Patient/family education    PT Goals (Current goals can be found in the Care Plan section)  Acute Rehab PT Goals Patient Stated Goal: I want to go home tonight and come back , why can't I do that PT Goal Formulation: With patient Time For Goal Achievement: 01/28/18 Potential to Achieve Goals: Good    Frequency Min 3X/week   Barriers to discharge        Co-evaluation               AM-PAC PT "6 Clicks" Mobility  Outcome Measure Help needed turning from your back to your side while in a flat bed without using bedrails?: A Little Help needed moving from lying on your back to sitting on the side of a flat bed without using bedrails?: A Lot Help needed moving to and from a bed to a chair (including a wheelchair)?: A Lot Help needed standing up from a chair using your arms (e.g., wheelchair or bedside chair)?: A Lot Help needed to walk in hospital room?: A Lot Help needed climbing 3-5 steps with a railing? : A Lot 6 Click Score: 13    End of Session Equipment Utilized During Treatment: Gait belt Activity Tolerance: Patient tolerated treatment well Patient left: in chair;with call bell/phone within reach;with chair alarm set Nurse Communication: Mobility status PT Visit Diagnosis: Unsteadiness on feet (R26.81);Other abnormalities of gait and mobility (R26.89);History of falling (Z91.81)    Time: 4401-0272 PT Time Calculation (min) (ACUTE ONLY): 33 min   Charges:   PT Evaluation $PT Eval Low Complexity: 1 Low PT Treatments $Therapeutic Activity: 8-22 mins        Clide Dales, PT Acute Rehabilitation Services Pager: [phone] Office: [phone] 01/14/2018   Clide Dales 01/14/2018, 6:04 PM

## 2018-01-14 NOTE — Progress Notes (Signed)
PHARMACY NOTE -  Cefepime  Pharmacy has been assisting with dosing of cefepime for pneumonia.  Dosage remains stable at 1g IV q8 hr and need for further dosage adjustment appears unlikely at present given improved renal function  Pharmacy will sign off, following peripherally for culture results or dose adjustments. Please reconsult if a change in clinical status warrants re-evaluation of dosage.  Reuel Boom, PharmD, BCPS [phone] 01/14/2018, 2:52 PM

## 2018-01-14 NOTE — Progress Notes (Signed)
PHARMACY - PHYSICIAN COMMUNICATION CRITICAL VALUE ALERT - BLOOD CULTURE IDENTIFICATION (BCID)  Results for orders placed or performed during the hospital encounter of 01/09/18  Blood Culture ID Panel (Reflexed) (Collected: 01/10/2018  7:44 PM)  Result Value Ref Range   Enterococcus species NOT DETECTED NOT DETECTED   Listeria monocytogenes NOT DETECTED NOT DETECTED   Staphylococcus species NOT DETECTED NOT DETECTED   Staphylococcus aureus (BCID) NOT DETECTED NOT DETECTED   Streptococcus species NOT DETECTED NOT DETECTED   Streptococcus agalactiae NOT DETECTED NOT DETECTED   Streptococcus pneumoniae NOT DETECTED NOT DETECTED   Streptococcus pyogenes NOT DETECTED NOT DETECTED   Acinetobacter baumannii NOT DETECTED NOT DETECTED   Enterobacteriaceae species NOT DETECTED NOT DETECTED   Enterobacter cloacae complex NOT DETECTED NOT DETECTED   Escherichia coli NOT DETECTED NOT DETECTED   Klebsiella oxytoca NOT DETECTED NOT DETECTED   Klebsiella pneumoniae NOT DETECTED NOT DETECTED   Proteus species NOT DETECTED NOT DETECTED   Serratia marcescens NOT DETECTED NOT DETECTED   Haemophilus influenzae NOT DETECTED NOT DETECTED   Neisseria meningitidis NOT DETECTED NOT DETECTED   Pseudomonas aeruginosa NOT DETECTED NOT DETECTED   Candida albicans NOT DETECTED NOT DETECTED   Candida glabrata NOT DETECTED NOT DETECTED   Candida krusei NOT DETECTED NOT DETECTED   Candida parapsilosis NOT DETECTED NOT DETECTED   Candida tropicalis NOT DETECTED NOT DETECTED    Name of physician (or Provider) ContactedAlger Memos, NP text messaged results  Current antibiotics: Cefepime, Azithromycin, Vancomycin  Changes to prescribed antibiotics required: No recommendations for change in antibiotics at this time. Await result of final results  Everette Rank, PharmD 01/14/2018  2:46 AM

## 2018-01-15 LAB — CBC WITH DIFFERENTIAL/PLATELET
Abs Immature Granulocytes: 0.07 10*3/uL (ref 0.00–0.07)
BASOS ABS: 0 10*3/uL (ref 0.0–0.1)
Basophils Relative: 0 %
Eosinophils Absolute: 0 10*3/uL (ref 0.0–0.5)
Eosinophils Relative: 0 %
HCT: 25.7 % — ABNORMAL LOW (ref 39.0–52.0)
Hemoglobin: 7.6 g/dL — ABNORMAL LOW (ref 13.0–17.0)
Immature Granulocytes: 1 %
Lymphocytes Relative: 5 %
Lymphs Abs: 0.4 10*3/uL — ABNORMAL LOW (ref 0.7–4.0)
MCH: 28.5 pg (ref 26.0–34.0)
MCHC: 29.6 g/dL — ABNORMAL LOW (ref 30.0–36.0)
MCV: 96.3 fL (ref 80.0–100.0)
Monocytes Absolute: 0.8 10*3/uL (ref 0.1–1.0)
Monocytes Relative: 8 %
NRBC: 0 % (ref 0.0–0.2)
Neutro Abs: 7.6 10*3/uL (ref 1.7–7.7)
Neutrophils Relative %: 86 %
Platelets: 169 10*3/uL (ref 150–400)
RBC: 2.67 MIL/uL — ABNORMAL LOW (ref 4.22–5.81)
RDW: 15.9 % — AB (ref 11.5–15.5)
WBC: 8.9 10*3/uL (ref 4.0–10.5)

## 2018-01-15 LAB — IRON AND TIBC
IRON: 54 ug/dL (ref 45–182)
Saturation Ratios: 27 % (ref 17.9–39.5)
TIBC: 201 ug/dL — ABNORMAL LOW (ref 250–450)
UIBC: 147 ug/dL

## 2018-01-15 LAB — FOLATE: Folate: 15.1 ng/mL (ref >5.9)

## 2018-01-15 LAB — GLUCOSE, CAPILLARY
Glucose-Capillary: 72 mg/dL (ref 70–99)
Glucose-Capillary: 189 mg/dL — ABNORMAL HIGH (ref 70–99)
Glucose-Capillary: 372 mg/dL — ABNORMAL HIGH (ref 70–99)
Glucose-Capillary: 172 mg/dL — ABNORMAL HIGH (ref 70–99)

## 2018-01-15 LAB — URINE CULTURE: Culture: NO GROWTH

## 2018-01-15 LAB — FERRITIN: Ferritin: 178 ng/mL (ref 24–336)

## 2018-01-15 LAB — CULTURE, BLOOD (ROUTINE X 2)
CULTURE: NO GROWTH
Special Requests: ADEQUATE

## 2018-01-15 LAB — PREPARE RBC (CROSSMATCH)

## 2018-01-15 LAB — HEMOGLOBIN AND HEMATOCRIT, BLOOD
HCT: 33.5 % — ABNORMAL LOW (ref 39.0–52.0)
Hemoglobin: 10.3 g/dL — ABNORMAL LOW (ref 13.0–17.0)

## 2018-01-15 LAB — VITAMIN B12: Vitamin B-12: 1353 pg/mL — ABNORMAL HIGH (ref 180–914)

## 2018-01-15 MED ORDER — IPRATROPIUM-ALBUTEROL 0.5-2.5 (3) MG/3ML IN SOLN
3.0000 mL | Freq: Two times a day (BID) | RESPIRATORY_TRACT | Status: DC
  Administered 2018-01-16 – 2018-01-17 (×3): 3 mL via RESPIRATORY_TRACT
  Filled 2018-01-15 (×2): qty 3

## 2018-01-15 MED ORDER — SODIUM CHLORIDE 0.9% IV SOLUTION: Freq: Once | INTRAVENOUS | Status: DC

## 2018-01-15 NOTE — Plan of Care (Signed)
Pt ate breakfast and lunch today. Had BM after dose of miralax. Able to ambulate to bathroom with walker. Wife at bedside. Pt alert and oriented today, cooperative.

## 2018-01-15 NOTE — Care Management Important Message (Signed)
Important Message  Patient Details  Name: MONTRICE GRACEY MRN: [id number] Date of Birth: Dec 27, 1931   Medicare Important Message Given:  Yes    Kerin Salen 01/15/2018, 2:18 [place] Message  Patient Details  Name: ANASTASIOS MELANDER MRN: [id number] Date of Birth: 07/12/1931   Medicare Important Message Given:  Yes    Kerin Salen 01/15/2018, 2:18 PM

## 2018-01-15 NOTE — Progress Notes (Addendum)
PROGRESS NOTE    LYRIK Frank  ZMO:[id number] DOB: Jan 23, 1932 DOA: 01/09/2018 PCP: [name], MD   Brief Narrative:  Steve Frank is a 84 y.o. male with history of metastatic non-small cell lung cancer, CAD status post CABG, COPD presently on Keytruda being followed by Dr. Julien Nordmann, oncologist presents with worsening shortness of breath over the last 2 weeks.  As per the oncology notes patient has chronic shortness of breath secondary to pleural effusion and radiation pneumonitis.  Patient states his shortness of breath is acutely worsened and he has left-sided Pleurx catheter which has stopped draining for last 2 weeks gradually.  Patient also has been having some productive cough but denies any fever or chills.  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)   Pleural effusion, bilateral   Palliative care by specialist  Acute on chronic hypoxic respiratory failure with hypoxia likely from CAP Vs ?malignant pleural effusion Vs Pneumonitis from immunotherapy  Baseline on 3L of home O2  Afebrile with leukocytosis (steroid induced) Pleurx catheter not draining as L pleural effusion has resolved, currently with R sided effusion as per CT chest with Large L lung consolidation PCCM consulted s/p R thoracenetsis, body fluid culture NGTD Continue cefepime, azithromycin, d/c vancomycin on 01/14/18 Pt can be discharged on PO Augmentin for a total of 10 days of AB and a slow taper of prednisone, over 2-4 weeks as per pulmonary Palliative consulted for [place]- Family yet to make decision for possible home hospice vs palliative  ?Gram negative bacteremia Afebrile, with leukocytosis (steroid induced) BC X 1, aerobic bottle growing gram -rods Awaiting final culture report. Repeat BC X  2 NGTD Continue IV AB  ?UTI C/O dysuria on 01/13/18 UA neg for infection (unlikely as pt as been on AB) UC no growth  COPD Management as above Continue solumedrol, duonebs  Diabetes mellitus type 2 Continue Lantus insulin and sliding scale coverage.  Anemia likely from malignancy  Hgb with a gradual drop to 7.6 Anemia panel WNL Transfuse 1U of PRBC on 01/15/18 Daily CBC  Metastatic non-small cell lung cancer Followed by Dr. Julien Nordmann.   DVT prophylaxis: Lovenox Code Status: DNR Family Communication: Wife at bedside Disposition Plan: Awaiting SNF   Consultants:  IR PCCM Palliative team  Procedures:   R sided thoracentesis on 01/11/18   Antimicrobials:  Anti-infectives (From admission, onward)   Start     Dose/Rate Route Frequency Ordered Stop   01/11/18 1600  vancomycin (VANCOCIN) IVPB 1000 mg/200 mL premix  Status:  Discontinued     1,000 mg 200 mL/hr over 60 Minutes Intravenous Every 24 hours 01/11/18 1028 01/14/18 1437   01/11/18 1200  vancomycin (VANCOCIN) 1,250 mg in sodium chloride 0.9 % 250 mL IVPB  Status:  Discontinued     1,250 mg 166.7 mL/hr over 90 Minutes Intravenous Every 24 hours 01/10/18 2050 01/11/18 1028   01/11/18 1000  azithromycin (ZITHROMAX) tablet 500 mg  Status:  Discontinued     500 mg Oral Every 24 hours 01/10/18 1510 01/10/18 1921   01/11/18 1000  cefTRIAXone (ROCEPHIN) 1 g in sodium chloride 0.9 % 100 mL IVPB  Status:  Discontinued     1 g 200 mL/hr over 30 Minutes Intravenous Every 24 hours 01/10/18 1516 01/10/18 1921   01/11/18 1000  azithromycin [facility]) tablet 500 mg     500 mg Oral Every 24 hours 01/10/18 1924 01/18/18 0959   01/11/18 0400  ceFEPIme (MAXIPIME) 1 g in sodium chloride 0.9 % 100 mL IVPB     1 g 200 mL/hr over 30 Minutes Intravenous Every 8 hours 01/10/18 2050     01/10/18 2000  vancomycin (VANCOCIN) 1,250 mg in sodium chloride 0.9 % 250 mL IVPB     1,250 mg 166.7 mL/hr over 90 Minutes Intravenous  Once 01/10/18  1856 01/10/18 2113   01/10/18 1930  ceFEPIme (MAXIPIME) 2 g in sodium chloride 0.9 % 100 mL IVPB     2 g 200 mL/hr over 30 Minutes Intravenous STAT 01/10/18 1923 01/10/18 2011   01/10/18 1600  cefTRIAXone (ROCEPHIN) 1 g in sodium chloride 0.9 % 100 mL IVPB  Status:  Discontinued     1 g 200 mL/hr over 30 Minutes Intravenous Every 24 hours 01/10/18 1510 01/10/18 1516   01/10/18 0615  levofloxacin (LEVAQUIN) IVPB 750 mg  Status:  Discontinued     750 mg 100 mL/hr over 90 Minutes Intravenous Daily 01/10/18 0600 01/10/18 1508     Subjective: Pt denies any new complaints   Objective: Vitals:   01/15/18 1344 01/15/18 1421 01/15/18 1432 01/15/18 1649  BP: 114/60 131/62 131/62 (!) 158/78  Pulse: 81 81 79 88  Resp: 16 18  18   Temp: 98.4 F (36.9 C) 98.2 F (36.8 C) 98.2 F (36.8 C) 98.1 F (36.7 C)  TempSrc:  Oral Oral   SpO2: 97% 97% 97% 90%  Weight:      Height:        Intake/Output Summary (Last 24 hours) at 01/15/2018 1825 Last data filed at 01/15/2018 1700 Gross per 24 hour  Intake 725.79 ml  Output 1150 ml  Net -424.21 ml   Filed Weights   01/12/18 0500 01/13/18 0358 01/15/18 0500  Weight: 64.8 kg 66.2 kg 67.3 kg    Examination:  General: NAD   Cardiovascular: S1, S2 present  Respiratory: Coarse BS b/l  Abdomen: Soft, nontender, nondistended, bowel sounds present  Musculoskeletal: No bilateral pedal edema noted  Skin: Normal  Psychiatry: Normal mood   Data Reviewed: I have personally reviewed following labs and imaging studies  CBC: Recent Labs  Lab 01/09/18 1648  01/11/18 0345 01/12/18 0500 01/13/18 0330 01/14/18 0339 01/15/18 0329  WBC 10.7*   < > 18.7* 17.3* 15.5* 12.0* 8.9  NEUTROABS 9.2*  --   --  16.6* 14.5* 10.9* 7.6  HGB 9.4*   < > 10.0* 8.4* 8.2* 7.7* 7.6*  HCT 30.7*   < > 33.0* 27.6* 27.6* 25.4* 25.7*  MCV 96.8   < > 97.9 97.5 96.2 98.1 96.3  PLT 218   < > 177 176 213 180 169   < > = values in this interval not displayed.   Basic  Metabolic Panel: Recent Labs  Lab 01/10/18 1944 01/11/18 0345 01/12/18 0500 01/13/18 0330 01/14/18 0339  NA 136 138 140 142 141  K 4.0 4.2 4.4 4.1 4.2  CL 98 102 102 105 105  CO2 28 29 28 29  32  GLUCOSE 227* 215* 208* 115* 173*  BUN 19 26* 49* 60* 51*  CREATININE 0.79 0.81 1.01 1.06 0.93  CALCIUM 8.3* 8.6* 8.7* 8.7* 8.4*  MG  --  1.8  --   --   --    GFR: Estimated Creatinine Clearance: 56.3 mL/min (by C-G formula based on SCr of 0.93 mg/dL). Liver Function  Tests: Recent Labs  Lab 01/11/18 0345  AST 16  ALT 16  ALKPHOS 51  BILITOT 0.6  PROT 6.3*  ALBUMIN 2.4*   No results for input(s): LIPASE, AMYLASE in the last 168 hours. No results for input(s): AMMONIA in the last 168 hours. Coagulation Profile: No results for input(s): INR, PROTIME in the last 168 hours. Cardiac Enzymes: No results for input(s): CKTOTAL, CKMB, CKMBINDEX, TROPONINI in the last 168 hours. BNP (last 3 results) No results for input(s): PROBNP in the last 8760 hours. HbA1C: No results for input(s): HGBA1C in the last 72 hours. CBG: Recent Labs  Lab 01/14/18 1620 01/14/18 2044 01/15/18 0735 01/15/18 1214 01/15/18 1652  GLUCAP 372* 232* 72 189* 372*   Lipid Profile: No results for input(s): CHOL, HDL, LDLCALC, TRIG, CHOLHDL, LDLDIRECT in the last 72 hours. Thyroid Function Tests: No results for input(s): TSH, T4TOTAL, FREET4, T3FREE, THYROIDAB in the last 72 hours. Anemia Panel: Recent Labs    01/15/18 0823  VITAMINB12 1,353*  FOLATE 15.1  FERRITIN 178  TIBC 201*  IRON 54   Sepsis Labs: Recent Labs  Lab 01/10/18 1944 01/11/18 0003  LATICACIDVEN 1.0 0.7    Recent Results (from the past 240 hour(s))  Culture, blood (routine x 2)     Status: None   Collection Time: 01/10/18  7:44 PM  Result Value Ref Range Status   Specimen Description   Final    BLOOD RIGHT ARM Performed at [facility] [address], [place], [place] [address]    Special Requests    Final    BOTTLES DRAWN AEROBIC AND ANAEROBIC Blood Culture adequate volume Performed at [facility] [address], [place], [place] [address]    Culture   Final    NO GROWTH 5 DAYS Performed at [facility], [place] [address], [place], [place] [address]    Report Status 01/15/2018 FINAL  Final  Culture, blood (routine x 2)     Status: None (Preliminary result)   Collection Time: 01/10/18  7:44 PM  Result Value Ref Range Status   Specimen Description   Final    BLOOD LEFT ARM Performed at [facility] [address] [place], [place] [address]    Special Requests   Final    BOTTLES DRAWN AEROBIC AND ANAEROBIC Blood Culture adequate volume Performed at [facility] [address] [place], [place] [address]    Culture  Setup Time   Final    GRAM NEGATIVE RODS AEROBIC BOTTLE ONLY CRITICAL RESULT CALLED TO, READ BACK BY AND VERIFIED WITH: L. POINDEXTER,PHARMD 0211 01/14/2018 T. TYSOR    Culture   Final    GRAM NEGATIVE RODS CULTURE REINCUBATED FOR BETTER GROWTH Performed at [facility], [place] [address] [place], [place] [address]    Report Status PENDING  Incomplete  Blood Culture ID Panel (Reflexed)     Status: None   Collection Time: 01/10/18  7:44 PM  Result Value Ref Range Status   Enterococcus species NOT DETECTED NOT DETECTED Final   Listeria monocytogenes NOT DETECTED NOT DETECTED Final   Staphylococcus species NOT DETECTED NOT DETECTED Final   Staphylococcus aureus (BCID) NOT DETECTED NOT DETECTED Final   Streptococcus species NOT DETECTED NOT DETECTED Final   Streptococcus agalactiae NOT DETECTED NOT DETECTED Final   Streptococcus pneumoniae NOT DETECTED NOT DETECTED Final   Streptococcus pyogenes NOT DETECTED NOT DETECTED Final   Acinetobacter baumannii NOT DETECTED NOT DETECTED Final  Enterobacteriaceae species NOT DETECTED NOT DETECTED Final   Enterobacter cloacae complex NOT DETECTED NOT  DETECTED Final   Escherichia coli NOT DETECTED NOT DETECTED Final   Klebsiella oxytoca NOT DETECTED NOT DETECTED Final   Klebsiella pneumoniae NOT DETECTED NOT DETECTED Final   Proteus species NOT DETECTED NOT DETECTED Final   Serratia marcescens NOT DETECTED NOT DETECTED Final   Haemophilus influenzae NOT DETECTED NOT DETECTED Final   Neisseria meningitidis NOT DETECTED NOT DETECTED Final   Pseudomonas aeruginosa NOT DETECTED NOT DETECTED Final   Candida albicans NOT DETECTED NOT DETECTED Final   Candida glabrata NOT DETECTED NOT DETECTED Final   Candida krusei NOT DETECTED NOT DETECTED Final   Candida parapsilosis NOT DETECTED NOT DETECTED Final   Candida tropicalis NOT DETECTED NOT DETECTED Final    Comment: Performed at [facility], [place] [address], [place], [place] [address]  MRSA PCR Screening     Status: Abnormal   Collection Time: 01/11/18  4:24 AM  Result Value Ref Range Status   MRSA by PCR POSITIVE (A) NEGATIVE Final    Comment:        The GeneXpert MRSA Assay (FDA approved for NASAL specimens only), is one component of a comprehensive MRSA colonization surveillance program. It is not intended to diagnose MRSA infection nor to guide or monitor treatment for MRSA infections. RESULT CALLED TO, READ BACK BY AND VERIFIED WITHPennie Rushing [id number] @ 6314 BY J SCOTTON Performed at [facility] [address], [place], [place] [address]   Body fluid culture     Status: None   Collection Time: 01/11/18 10:56 AM  Result Value Ref Range Status   Specimen Description   Final    PLEURAL Performed at [facility] [address], [place], [place] [address]    Special Requests   Final    NONE Performed at [facility], [place], [place] [address], [place], [place] [address]    Gram Stain   Final    RARE WBC PRESENT, PREDOMINANTLY MONONUCLEAR NO ORGANISMS SEEN    Culture   Final    NO GROWTH 3 DAYS Performed at [facility], [place] [address], [place], [place] [address]    Report Status 01/14/2018 FINAL  Final  Urine Culture     Status: None   Collection Time: 01/13/18  6:45 PM  Result Value Ref Range Status   Specimen Description   Final    URINE, CLEAN CATCH Performed at [facility], [place] [address] [place], [place] [address]    Special Requests   Final    NONE Performed at [facility], [place] [address] [place], [place] [address]    Culture   Final    NO GROWTH Performed at [facility], [place] [address], [place], [place] [address]    Report Status 01/15/2018 FINAL  Final  Culture, blood (routine x 2)     Status: None (Preliminary result)   Collection Time: 01/14/18  8:11 AM  Result Value Ref Range Status   Specimen Description   Final    RIGHT ANTECUBITAL Performed at [facility] [address], [place], [place] [address]    Special Requests   Final    BOTTLES DRAWN AEROBIC ONLY Blood Culture adequate volume Performed at [facility] [address] [place], [place] [address]    Culture   Final    NO GROWTH < 24 HOURS  Performed at [facility], [place] [address] [place], [place] [address]    Report Status PENDING  Incomplete  Culture, blood (routine x 2)     Status: None (Preliminary result)   Collection Time: 01/14/18  8:42 AM  Result Value Ref Range Status   Specimen Description   Final    BLOOD RIGHT ARM Performed at [facility] [address] [place], [place] [address]    Special Requests   Final    BOTTLES DRAWN AEROBIC ONLY Blood Culture adequate volume Performed at [facility] [address] [place], [place] [address]    Culture   Final    NO GROWTH < 24 HOURS Performed at [facility] [address], [place], [place] [address]    Report Status PENDING  Incomplete         Radiology Studies: No results  found.      Scheduled Meds: . sodium chloride   Intravenous Once  . azithromycin  500 mg Oral Q24H  . chlorhexidine  15 mL Mouth Rinse BID  . Chlorhexidine Gluconate Cloth  6 each Topical Q0600  . cholecalciferol  1,000 Units Oral Daily  . enoxaparin (LOVENOX) injection  40 mg Subcutaneous Q24H  . feeding supplement (ENSURE ENLIVE)  237 mL Oral BID BM  . gabapentin  400 mg Oral QHS  . insulin aspart  0-5 Units Subcutaneous QHS  . insulin aspart  0-9 Units Subcutaneous TID WC  . insulin aspart  5 Units Subcutaneous TID WC  . insulin glargine  16 Units Subcutaneous QHS  . ipratropium-albuterol  3 mL Nebulization BID  . mouth rinse  15 mL Mouth Rinse q12n4p  . mirtazapine  15 mg Oral QHS  . multivitamin with minerals  1 tablet Oral Daily  . mupirocin ointment   Nasal BID  . pantoprazole  40 mg Oral Daily  . predniSONE  40 mg Oral Q breakfast  . rosuvastatin  20 mg Oral Daily  . tamsulosin  0.4 mg Oral QHS   Continuous Infusions: . ceFEPime (MAXIPIME) IV 1 g (01/15/18 1200)     LOS: 5 days    Alma Friendly, MD [facility]   If 7PM-7AM, please contact night-coverage 01/15/2018, 6:25 PM

## 2018-01-15 NOTE — Clinical Social Work Note (Signed)
Clinical Social Work Assessment  Patient Details  Name: Steve Frank MRN: [id number] Date of Birth: 1931/12/03  Date of referral:  01/15/18               Reason for consult:  Discharge Planning, Facility Placement                Permission sought to share information with:  Facility Sport and exercise psychologist Permission granted to share information::  Yes, Verbal Permission Granted  Name::       [place]::  SNF  Relationship::  Spouse  Contact Information:    [phone], [phone]  Housing/Transportation Living arrangements for the past 2 months:  [place] of Information:  Patient, Spouse Patient Interpreter Needed:  None Criminal Activity/Legal Involvement Pertinent to Current Situation/Hospitalization:  No - Comment as needed Significant Relationships:  Adult Children Lives with:  Self Do you feel safe going back to the place where you live?  Yes Need for family participation in patient care:  Yes (Comment)  Care giving concerns:   As per the oncology notes patient has chronic shortness of breath secondary to pleural effusion and radiation pneumonitis. Patient states his shortness of breath is acutely worsened and he has left-sided Pleurx catheter which has stopped draining for last 2 weeks gradually.   Patient is weak and will need Physical Therapy before returning home.   Social Worker assessment / plan:  CSW discussed discharge planning with patient and spouse at bedside. Patient spouse is concern about the patient returning home before going to rehab. Patient spouse reports the patient has been in rehab at least three times this year. She reports the patient uses a walker at baseline and needs limited assistance with bathing and needs assistance with dressing. Patient spouse prefers a facility close their home in [place]. CSW explained SNF process and provided a list of bed offers. CSW will follow up with the patient and spouse to determine  their SNF bed choice.    FL2 done  Plan:SNF  Employment status:    Insurance information:  Medicare PT Recommendations:  [place] / Referral to community resources:  [place]  Patient/Family's Response to care:  Agreeable and Responding well to care.   Patient/Family's Understanding of and Emotional Response to Diagnosis, Current Treatment, and Prognosis:  Patient deferred to his spouse for understanding.   Emotional Assessment Appearance:  Developmentally appropriate Attitude/Demeanor/Rapport:    Affect (typically observed):  Accepting Orientation:  Oriented to Self, Oriented to Place, Oriented to Situation Alcohol / Substance use:  Not Applicable Psych involvement (Current and /or in the community):  No (Comment)  Discharge Needs  Concerns to be addressed:  Discharge Planning Concerns Readmission within the last 30 days:  No Current discharge risk:  Physical Impairment, Dependent with Mobility Barriers to Discharge:  Continued Medical Work up   [employer], LCSW 01/15/2018, 3:45 PM

## 2018-01-15 NOTE — NC FL2 (Signed)
[place] LEVEL OF CARE SCREENING TOOL     IDENTIFICATION  Patient Name: AAMIR MCLINDEN Birthdate: 06/03/1931 Sex: male Admission Date (Current Location): 01/09/2018  [facility] and [place] Number:  Herbalist and Address:  [facility],  [place] [address] [place]      Provider Number: [id number]  Attending Physician Name and Address:  Alma Friendly, MD  Relative Name and Phone Number:       Current Level of Care: Hospital Recommended Level of Care: [place] Prior Approval Number:    Date Approved/Denied:   PASRR Number:    Discharge Plan: SNF    Current Diagnoses: Patient Active Problem List   Diagnosis Date Noted  . Palliative care by specialist   . Acute respiratory failure with hypoxia ([place]) 01/09/2018  . Brain metastases ([place]) 08/22/2017  . Encounter for antineoplastic immunotherapy 04/04/2017  . Bacteremia 12/23/2016  . S/P thoracentesis   . PNA (pneumonia) 12/22/2016  . Neutropenic fever ([place]) 12/22/2016  . Pleural effusion, bilateral 12/22/2016  . Pancytopenia ([place]) 12/22/2016  . Lobar pneumonia ([place])   . Neutropenia ([place]) 12/20/2016  . Thrombocytopenia ([place]) 12/20/2016  . Dermatitis 12/20/2016  . Esophagitis 12/20/2016  . Constipation 12/20/2016  . Port-A-Cath in place 12/12/2016  . Goals of care, counseling/discussion 10/22/2016  . Encounter for antineoplastic chemotherapy 10/22/2016  . Stage IV squamous cell carcinoma of left lung ([place]) 10/20/2016  . Generalized weakness 09/09/2016  . Hyponatremia 09/09/2016  . Acute encephalopathy 09/05/2016  . BPH (benign prostatic hyperplasia) 09/04/2016  . Lung mass 08/08/2016  . Oropharyngeal dysphagia 08/08/2016  . Solitary left cerebellar brain metastasis ([place]) 08/04/2016  . Diabetes mellitus ([place]) 08/04/2016  . Essential hypertension 08/04/2016  . HLD (hyperlipidemia) 08/04/2016  . Loss of coordination 08/03/2016  . Atherosclerosis of  native coronary artery of native heart 05/31/2015  . Diabetic polyneuropathy associated with type 2 diabetes mellitus ([place]) 05/31/2015  . Emphysema lung ([place]) 05/31/2015  . GERD (gastroesophageal reflux disease) 05/31/2015    Orientation RESPIRATION BLADDER Height & Weight     Self, Place, Situation  O2 Continent Weight: 148 lb 5.9 oz (67.3 kg) Height:  6' (182.9 cm)  BEHAVIORAL SYMPTOMS/MOOD NEUROLOGICAL BOWEL NUTRITION STATUS      Continent Diet(See discharge summary )  AMBULATORY STATUS COMMUNICATION OF NEEDS Skin   Extensive Assist Verbally Normal                       Personal Care Assistance Level of Assistance  Bathing, Feeding, Dressing Bathing Assistance: Maximum assistance Feeding assistance: Independent Dressing Assistance: Maximum assistance     Functional Limitations Info  Sight, Hearing, Speech Sight Info: Impaired Hearing Info: Adequate Speech Info: Adequate    SPECIAL CARE FACTORS FREQUENCY  PT (By licensed PT), OT (By licensed OT)     PT Frequency: 5X/WEEK OT Frequency: 5X/WEK            Contractures Contractures Info: Not present    Additional Factors Info  Code Status, Allergies, Insulin Sliding Scale, Psychotropic Code Status Info: DNR Allergies Info: Allergies: Ciprofloxacin   Insulin Sliding Scale Info: See medication        Current Medications (01/15/2018):  This is the current hospital active medication list Current Facility-Administered Medications  Medication Dose Route Frequency Provider Last Rate Last Dose  . 0.9 %  sodium chloride infusion (Manually program via Guardrails IV Fluids)   Intravenous Once Alma Friendly, MD      .  acetaminophen (TYLENOL) tablet 650 mg  650 mg Oral Q6H PRN Elodia Florence., MD       Or  . acetaminophen (TYLENOL) suppository 650 mg  650 mg Rectal Q6H PRN Elodia Florence., MD   650 mg at 01/10/18 1946  . albuterol (PROVENTIL) (2.5 MG/3ML) 0.083% nebulizer solution 2.5 mg  2.5 mg  Nebulization Q4H PRN Alma Friendly, MD      . azithromycin [facility]) tablet 500 mg  500 mg Oral Q24H Elodia Florence., MD   500 mg at 01/15/18 [phone]  . ceFEPIme (MAXIPIME) 1 g in sodium chloride 0.9 % 100 mL IVPB  1 g Intravenous Q8H Glogovac, Nikola, RPH 200 mL/hr at 01/15/18 1200 1 g at 01/15/18 1200  . chlorhexidine (PERIDEX) 0.12 % solution 15 mL  15 mL Mouth Rinse BID Elodia Florence., MD   15 mL at 01/15/18 [phone]  . Chlorhexidine Gluconate Cloth 2 % PADS 6 each  6 each Topical Q0600 Alma Friendly, MD   6 each at 01/15/18 [phone]  . cholecalciferol (VITAMIN D3) tablet 1,000 Units  1,000 Units Oral Daily Elodia Florence., MD   1,000 Units at 01/15/18 [phone]  . docusate sodium (COLACE) capsule 100 mg  100 mg Oral BID PRN Elodia Florence., MD   100 mg at 01/15/18 0853  . enoxaparin (LOVENOX) injection 40 mg  40 mg Subcutaneous Q24H Alma Friendly, MD   40 mg at 01/14/18 1606  . feeding supplement (ENSURE ENLIVE) (ENSURE ENLIVE) liquid 237 mL  237 mL Oral BID BM Elodia Florence., MD   237 mL at 01/15/18 1513  . gabapentin (NEURONTIN) capsule 400 mg  400 mg Oral QHS Elodia Florence., MD   400 mg at 01/14/18 2151  . guaiFENesin-dextromethorphan (ROBITUSSIN DM) 100-10 MG/5ML syrup 15 mL  15 mL Oral Q4H PRN Elodia Florence., MD      . insulin aspart (novoLOG) injection 0-5 Units  0-5 Units Subcutaneous QHS Alma Friendly, MD   2 Units at 01/14/18 2153  . insulin aspart (novoLOG) injection 0-9 Units  0-9 Units Subcutaneous TID WC Alma Friendly, MD   2 Units at 01/15/18 1228  . insulin aspart (novoLOG) injection 5 Units  5 Units Subcutaneous TID WC Alma Friendly, MD   5 Units at 01/15/18 1228  . insulin glargine (LANTUS) injection 16 Units  16 Units Subcutaneous QHS Elodia Florence., MD   16 Units at 01/14/18 2153  . ipratropium-albuterol (DUONEB) 0.5-2.5 (3) MG/3ML nebulizer solution 3 mL  3 mL Nebulization BID Alma Friendly, MD      . lip balm (CARMEX) ointment 1 application  1 application Topical PRN Alma Friendly, MD      . MEDLINE mouth rinse  15 mL Mouth Rinse q12n4p Elodia Florence., MD   15 mL at 01/14/18 1606  . mirtazapine (REMERON) tablet 15 mg  15 mg Oral QHS Elodia Florence., MD   15 mg at 01/14/18 2151  . multivitamin with minerals tablet 1 tablet  1 tablet Oral Daily Elodia Florence., MD   1 tablet at 01/15/18 [phone]  . mupirocin ointment (BACTROBAN) 2 %   Nasal BID Alma Friendly, MD      . ondansetron [facility]) tablet 4 mg  4 mg Oral Q6H PRN Elodia Florence., MD       Or  . ondansetron [facility])  injection 4 mg  4 mg Intravenous Q6H PRN Elodia Florence., MD      . pantoprazole (PROTONIX) EC tablet 40 mg  40 mg Oral Daily Elodia Florence., MD   40 mg at 01/15/18 [phone]  . polyethylene glycol (MIRALAX / GLYCOLAX) packet 17 g  17 g Oral Daily PRN Elodia Florence., MD      . predniSONE (DELTASONE) tablet 40 mg  40 mg Oral Q breakfast Alma Friendly, MD   40 mg at 01/15/18 0852  . rosuvastatin (CRESTOR) tablet 20 mg  20 mg Oral Daily Elodia Florence., MD   20 mg at 01/15/18 [phone]  . sodium chloride flush (NS) 0.9 % injection 10-40 mL  10-40 mL Intracatheter PRN Elodia Florence., MD   10 mL at 01/15/18 0823  . tamsulosin (FLOMAX) capsule 0.4 mg  0.4 mg Oral QHS Elodia Florence., MD   0.4 mg at 01/14/18 2151     Discharge Medications: Please see discharge summary for a list of discharge medications.  Relevant Imaging Results:  Relevant Lab Results:   Additional Information [id number]  Lia Hopping, LCSW

## 2018-01-16 ENCOUNTER — Inpatient Hospital Stay (HOSPITAL_COMMUNITY): Payer: Medicare Other

## 2018-01-16 LAB — GLUCOSE, CAPILLARY
Glucose-Capillary: 86 mg/dL (ref 70–99)
Glucose-Capillary: 276 mg/dL — ABNORMAL HIGH (ref 70–99)
Glucose-Capillary: 185 mg/dL — ABNORMAL HIGH (ref 70–99)
Glucose-Capillary: 167 mg/dL — ABNORMAL HIGH (ref 70–99)

## 2018-01-16 LAB — BASIC METABOLIC PANEL
ANION GAP: 6 (ref 5–15)
BUN: 32 mg/dL — ABNORMAL HIGH (ref 8–23)
CO2: 34 mmol/L — ABNORMAL HIGH (ref 22–32)
Calcium: 8.2 mg/dL — ABNORMAL LOW (ref 8.9–10.3)
Chloride: 99 mmol/L (ref 98–111)
Creatinine, Ser: 0.69 mg/dL (ref 0.61–1.24)
GFR calc Af Amer: >60 mL/min (ref >60)
GFR calc non Af Amer: >60 mL/min (ref >60)
Glucose, Bld: 131 mg/dL — ABNORMAL HIGH (ref 70–99)
Potassium: 4.1 mmol/L (ref 3.5–5.1)
SODIUM: 139 mmol/L (ref 135–145)

## 2018-01-16 LAB — CBC WITH DIFFERENTIAL/PLATELET
Abs Immature Granulocytes: 0.09 10*3/uL — ABNORMAL HIGH (ref 0.00–0.07)
Basophils Absolute: 0 10*3/uL (ref 0.0–0.1)
Basophils Relative: 0 %
Eosinophils Absolute: 0.1 10*3/uL (ref 0.0–0.5)
Eosinophils Relative: 1 %
HCT: 32.4 % — ABNORMAL LOW (ref 39.0–52.0)
Hemoglobin: 10 g/dL — ABNORMAL LOW (ref 13.0–17.0)
IMMATURE GRANULOCYTES: 1 %
Lymphocytes Relative: 5 %
Lymphs Abs: 0.5 10*3/uL — ABNORMAL LOW (ref 0.7–4.0)
MCH: 28.6 pg (ref 26.0–34.0)
MCHC: 30.9 g/dL (ref 30.0–36.0)
MCV: 92.6 fL (ref 80.0–100.0)
Monocytes Absolute: 0.9 10*3/uL (ref 0.1–1.0)
Monocytes Relative: 8 %
Neutro Abs: 9.3 10*3/uL — ABNORMAL HIGH (ref 1.7–7.7)
Neutrophils Relative %: 85 %
Platelets: 164 10*3/uL (ref 150–400)
RBC: 3.5 MIL/uL — ABNORMAL LOW (ref 4.22–5.81)
RDW: 16.5 % — ABNORMAL HIGH (ref 11.5–15.5)
WBC: 10.9 10*3/uL — ABNORMAL HIGH (ref 4.0–10.5)
nRBC: 0 % (ref 0.0–0.2)

## 2018-01-16 NOTE — Plan of Care (Signed)
Pt alert and oriented, some disorientation at times.  Coughed up some bloody sputum this am. MD aware, RN will monitor.

## 2018-01-16 NOTE — Progress Notes (Signed)
Provided patient and wife with hospice list

## 2018-01-16 NOTE — Care Management Note (Signed)
Case Management Note  Patient Details  Name: Steve Frank MRN: [id number] Date of Birth: 1931-06-12  Subjective/Objective:    Received a call from attending that patient and spouse wish to d/c home with hospice services. Spoke with patient at bedside and spouse on the phone. Provided a list for hospice choice.                 Action/Plan: Spouse has chosen [facility], contacted them for the referral. They will f/u with the patient and spouse.   Expected Discharge Date:                  Expected Discharge Plan:  Home w Hospice Care  In-House Referral:  Clinical Social Work, Hospice / Palliative Care  Discharge planning Services  CM Consult  Post Acute Care Choice:  Hospice Choice offered to:  Patient, Spouse  DME Arranged:  N/A DME Agency:  NA  HH Arranged:  Disease Management [place] Agency:  [place]  Status of Service:  Completed, signed off  If discussed at [employer] of Stay Meetings, dates discussed:    Additional Comments:  Guadalupe Maple, RN 01/16/2018, 2:38 PM

## 2018-01-16 NOTE — Progress Notes (Signed)
Nutrition Follow-up  INTERVENTION:   Continue Ensure Enlive BID, each supplement provides 350 kcal and 20 grams of protein.  NUTRITION DIAGNOSIS:   Inadequate oral intake related to acute illness as evidenced by meal completion < 50%.  Ongoing.  GOAL:   Patient will meet greater than or equal to 90% of their needs  Progressing  MONITOR:   PO intake, Supplement acceptance, Weight trends, Labs, I & O's    ASSESSMENT:   84 y.o. male with history of metastatic non-small cell lung cancer, CAD s/p CABG, and COPD. Patient presented with 2 weeks of worsening SOB. Oncology notes indicate patient has chronic SOB 2/2 pleural effusion and radiation pneumonitis. He has L-sided Pleurx which has gradually stopped draining for the last 2 weeks.  Patient also has been having some productive cough but denies any fever or chills.  Patient currently consuming 50-75% of meals at this time. Pt is drinking Ensure supplements as well.  Weights have remained stable.   Medications: Vitamin D tablet daily, Remeron tablet daily, Multivitamin with minerals daily Labs reviewed: CBGs: 86-276  Diet Order:   Diet Order            Diet heart healthy/carb modified Room service appropriate? Yes; Fluid consistency: Thin  Diet effective now              EDUCATION NEEDS:   Not appropriate for education at this time  Skin:  Skin Assessment: Reviewed RN Assessment  Last BM:  12/9  Height:   Ht Readings from Last 1 Encounters:  01/10/18 6' (1.829 m)    Weight:   Wt Readings from Last 1 Encounters:  01/15/18 67.3 kg    Ideal Body Weight:  80.91 kg  BMI:  Body mass index is 20.12 kg/m.  Estimated Nutritional Needs:   Kcal:  1830-2030 kcal  Protein:  80-92 grams  Fluid:  >/= 1.5 L/day  Clayton Bibles, MS, RD, LDN [place] Dietitian Pager: [phone] After Hours Pager: [phone]

## 2018-01-16 NOTE — Progress Notes (Signed)
**Note De-Identified vi Obfusction** PROGRESS NOTE    Steve Frank  ERD:[id number] DOB: 30-Mar-1931 DOA: 01/09/2018 PCP: Drke Lech, MD   Brief Nrrtive:  Steve Frank  84 y.o.mlewithhistory of metsttic non-smll cell lung cncer, CAD sttus post CABG, COPD presently on Keytrud being followed by Dr. Julien Nordmnn, oncologist presents with worsening shortness of breth over the lst 2 weeks. As per the oncology notes ptient hs chronic shortness of breth secondry to pleurl effusion nd rdition pneumonitis. Ptient sttes his shortness of breth is cutely worsened nd he hs left-sided Pleurx ctheter which hs stopped drining for lst 2 weeks grdully. Ptient lso hs been hving some productive cough but denies ny fever or chills.  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  Cre mngement c/s.   Acute on chronic hypoxic respirtory filure with hypoxi likely from CAP Vs ?mlignnt pleurl effusion Vs Pneumonitis from immunotherpy  Bseline on 3L of home O2  Afebrile (lst fever 12/4) with leukocytosis (steroid induced) Pleurx ctheter not drining  currently with R sided effusion s per CT chest with Lrge L lung consolidtion PCCM consulted s/p R thorcenetsis, body fluid culture NGTD Currently pln is for pt to go home with hospice on dischrge, will remove pleurx ctheter.  IR consulted.  Continue cefepime. d/c zithromycin, d/c vncomycin  Pt cn be dischrged on PO Augmentin for  totl of 10 dys of AB nd  slow tper of prednisone, over 2-4 weeks s per pulmonry Pllitive consulted for Elmir (see note from  12/6).  Hemoptysis: follow CXR.  Smll volume.  Likely relted to mlignncy.  Continue to monitor. D/c lovenox  ?Grm negtive bcteremi Per discussion with phrmcy, our micro unble to ID, hs been sent to [facility] ("not cting like typicl grm negtive rod") Afebrile, with leukocytosis (steroid induced) BC X 1, erobic bottle growing grm -rods [place] finl culture report. Repet BC X 2 NGTD Continue IV AB for now  Dysuri C/O dysuri on 01/13/18 UA neg for infection (unlikely s pt s been on AB) UC no growth  COPD Mngement s bove Continue solumedrol, duonebs  Dibetes mellitus type 2 Continue Lntus insulin nd sliding scle coverge.  Anemi likely from mlignncy  Hgb with  grdul drop to 7.6 Anemi pnel suggestive of AOCD S/p 1 unit pRBC 01/15/18 Dily CBC  Metsttic non-smll cell lung cncer Followed by Dr. Julien Nordmnn.  DVT prophylxis: SCD's Code Sttus: DNR Fmily Communiction: wife t bedside Disposition Pln: home with hospice within next dy or so   Consultnts:  IR PCCM Pllitive Cre  Procedures:  R sided thorcentesis on 01/11/18  Antimicrobils: Anti-infectives (From dmission, onwrd)   Strt     Dose/Rte Route Frequency Ordered Stop   01/11/18 1600  vncomycin (VANCOCIN) IVPB 1000 mg/200 mL premix  Sttus:  Discontinued     1,000 mg 200 mL/hr over 60 Minutes Intrvenous Every 24 hours 01/11/18 1028 01/14/18 1437   01/11/18 1200  vncomycin (VANCOCIN) 1,250 mg in sodium chloride 0.9 % 250 mL IVPB  Sttus:  Discontinued     1,250 mg 166.7 mL/hr over 90 Minutes Intrvenous Every 24 hours 01/10/18 2050 01/11/18  1028   01/11/18 1000  azithromycin (ZITHROMAX) tablet 500 mg  Status:  Discontinued     500 mg Oral Every 24 hours 01/10/18 1510 01/10/18 1921   01/11/18 1000  cefTRIAXone (ROCEPHIN) 1 g in sodium chloride 0.9 % 100 mL IVPB  Status:  Discontinued     1 g 200 mL/hr over 30 Minutes Intravenous Every 24 hours 01/10/18  1516 01/10/18 1921   01/11/18 1000  azithromycin (ZITHROMAX) tablet 500 mg  Status:  Discontinued     500 mg Oral Every 24 hours 01/10/18 1924 01/16/18 1620   01/11/18 0400  ceFEPIme (MAXIPIME) 1 g in sodium chloride 0.9 % 100 mL IVPB     1 g 200 mL/hr over 30 Minutes Intravenous Every 8 hours 01/10/18 2050     01/10/18 2000  vancomycin (VANCOCIN) 1,250 mg in sodium chloride 0.9 % 250 mL IVPB     1,250 mg 166.7 mL/hr over 90 Minutes Intravenous  Once 01/10/18 1856 01/10/18 2113   01/10/18 1930  ceFEPIme (MAXIPIME) 2 g in sodium chloride 0.9 % 100 mL IVPB     2 g 200 mL/hr over 30 Minutes Intravenous STAT 01/10/18 1923 01/10/18 2011   01/10/18 1600  cefTRIAXone (ROCEPHIN) 1 g in sodium chloride 0.9 % 100 mL IVPB  Status:  Discontinued     1 g 200 mL/hr over 30 Minutes Intravenous Every 24 hours 01/10/18 1510 01/10/18 1516   01/10/18 0615  levofloxacin (LEVAQUIN) IVPB 750 mg  Status:  Discontinued     750 mg 100 mL/hr over 90 Minutes Intravenous Daily 01/10/18 0600 01/10/18 1508     Subjective: Some SOB.  Feels about the same. Hemoptysis this morning. Filled bottom of cup x2.    Objective: Vitals:   01/15/18 2135 01/16/18 0617 01/16/18 1019 01/16/18 1415  BP: 136/82 (!) 151/77  (!) 148/80  Pulse: 67 72 77 77  Resp: 17 17 18 18   Temp: 97.8 F (36.6 C) 97.7 F (36.5 C)  97.8 F (36.6 C)  TempSrc: Oral Oral    SpO2: 93% 93% 92% 100%  Weight:      Height:        Intake/Output Summary (Last 24 hours) at 01/16/2018 1530 Last data filed at 01/16/2018 1140 Gross per 24 hour  Intake 1352.85 ml  Output 1700 ml  Net -347.15 ml   Filed Weights   01/12/18 0500 01/13/18 0358 01/15/18 0500  Weight: 64.8 kg 66.2 kg 67.3 kg    Examination:  General exam: Appears calm and comfortable  Respiratory system: Coarse breath sounds, decreased at bases, slightly increased WOB, Benkelman in place Cardiovascular system: S1 & S2 heard, RRR. Gastrointestinal system: Abdomen is nondistended, soft  and nontender Central nervous system: Alert and disoriented. No focal neurological deficits. Extremities: no LEE Skin: No rashes, lesions or ulcers Psychiatry: Judgement and insight appear normal. Mood & affect appropriate.     Data Reviewed: I have personally reviewed following labs and imaging studies  CBC: Recent Labs  Lab 01/12/18 0500 01/13/18 0330 01/14/18 0339 01/15/18 0329 01/15/18 1900 01/16/18 0354  WBC 17.3* 15.5* 12.0* 8.9  --  10.9*  NEUTROABS 16.6* 14.5* 10.9* 7.6  --  9.3*  HGB 8.4* 8.2* 7.7* 7.6* 10.3* 10.0*  HCT 27.6* 27.6* 25.4* 25.7* 33.5* 32.4*  MCV 97.5 96.2 98.1 96.3  --  92.6  PLT 176 213 180 169  --  297   Basic Metabolic Panel: Recent Labs  Lab 01/11/18 0345 01/12/18 0500 01/13/18 0330 01/14/18 0339 01/16/18 0945  NA 138 140 142 141 139  K 4.2 4.4 4.1 4.2 4.1  CL 102 102 105 105 99  CO2 29 28 29  32 34*  GLUCOSE 215* 208* 115* 173* 131*  BUN 26* 49* 60* 51* 32*  CREATININE 0.81 1.01 1.06 0.93 0.69  CALCIUM 8.6* 8.7* 8.7* 8.4* 8.2*  MG 1.8  --   --   --   --    GFR: Estimated Creatinine Clearance: 65.4 mL/min (by C-G formula based on SCr of 0.69 mg/dL). Liver Function Tests: Recent Labs  Lab 01/11/18 0345  AST 16  ALT 16  ALKPHOS 51  BILITOT 0.6  PROT 6.3*  ALBUMIN 2.4*   No results for input(s): LIPASE, AMYLASE in the last 168 hours. No results for input(s): AMMONIA in the last 168 hours. Coagulation Profile: No results for input(s): INR, PROTIME in the last 168 hours. Cardiac Enzymes: No results for input(s): CKTOTAL, CKMB, CKMBINDEX, TROPONINI in the last 168 hours. BNP (last 3 results) No results for input(s): PROBNP in the last 8760 hours. HbA1C: No results for input(s): HGBA1C in the last 72 hours. CBG: Recent Labs  Lab 01/15/18 1214 01/15/18 1652 01/15/18 2241 01/16/18 0735 01/16/18 1255  GLUCAP 189* 372* 172* 86 276*   Lipid Profile: No results for input(s): CHOL, HDL, LDLCALC, TRIG, CHOLHDL, LDLDIRECT in the  last 72 hours. Thyroid Function Tests: No results for input(s): TSH, T4TOTAL, FREET4, T3FREE, THYROIDAB in the last 72 hours. Anemia Panel: Recent Labs    01/15/18 0823  VITAMINB12 1,353*  FOLATE 15.1  FERRITIN 178  TIBC 201*  IRON 54   Sepsis Labs: Recent Labs  Lab 01/10/18 1944 01/11/18 0003  LATICACIDVEN 1.0 0.7    Recent Results (from the past 240 hour(s))  Culture, blood (routine x 2)     Status: None   Collection Time: 01/10/18  7:44 PM  Result Value Ref Range Status   Specimen Description   Final    BLOOD RIGHT ARM Performed at [facility] [address], [place], [place] [address]    Special Requests   Final    BOTTLES DRAWN AEROBIC AND ANAEROBIC Blood Culture adequate volume Performed at [facility] [address], [place], [place] [address]    Culture   Final    NO GROWTH 5 DAYS Performed at [facility], [place] [address], [place], [place] [address]    Report Status 01/15/2018 FINAL  Final  Culture, blood (routine x 2)     Status: None (Preliminary result)   Collection Time: 01/10/18  7:44 PM  Result Value Ref Range Status   Specimen Description   Final    BLOOD LEFT ARM Performed at [facility] [address], [place], [place] [address]    Special Requests   Final    BOTTLES DRAWN AEROBIC AND ANAEROBIC Blood Culture adequate volume Performed at [facility] [address], [place], [place] [address]    Culture  Setup Time   Final    GRAM NEGATIVE RODS AEROBIC BOTTLE ONLY CRITICAL RESULT CALLED TO, READ BACK BY AND VERIFIED WITH: L. POINDEXTER,PHARMD 0211 01/14/2018 T. TYSOR    Culture   Final    Lonell Grandchild NEGATIVE RODS Launa Grill FOR ID AND SUSCEPTIBILITY Performed at [facility], [place] [address] [place] [address], [place], [place] [address]    Report Status PENDING  Incomplete  Blood Culture ID Panel (Reflexed)     Status: None   Collection Time: 01/10/18 **Note De-Identified vi Obfusction** 7:44 PM    Result Vlue Ref Rnge Sttus   Enterococcus species NOT DETECTED NOT DETECTED Finl   Listeri monocytogenes NOT DETECTED NOT DETECTED Finl   Stphylococcus species NOT DETECTED NOT DETECTED Finl   Stphylococcus ureus (BCID) NOT DETECTED NOT DETECTED Finl   Streptococcus species NOT DETECTED NOT DETECTED Finl   Streptococcus glctie NOT DETECTED NOT DETECTED Finl   Streptococcus pneumonie NOT DETECTED NOT DETECTED Finl   Streptococcus pyogenes NOT DETECTED NOT DETECTED Finl   cinetobcter bumnnii NOT DETECTED NOT DETECTED Finl   Enterobctericee species NOT DETECTED NOT DETECTED Finl   Enterobcter cloce complex NOT DETECTED NOT DETECTED Finl   Escherichi coli NOT DETECTED NOT DETECTED Finl   Klebsiell oxytoc NOT DETECTED NOT DETECTED Finl   Klebsiell pneumonie NOT DETECTED NOT DETECTED Finl   Proteus species NOT DETECTED NOT DETECTED Finl   Serrti mrcescens NOT DETECTED NOT DETECTED Finl   Hemophilus influenze NOT DETECTED NOT DETECTED Finl   Neisseri meningitidis NOT DETECTED NOT DETECTED Finl   Pseudomons eruginos NOT DETECTED NOT DETECTED Finl   Cndid lbicns NOT DETECTED NOT DETECTED Finl   Cndid glbrt NOT DETECTED NOT DETECTED Finl   Cndid krusei NOT DETECTED NOT DETECTED Finl   Cndid prpsilosis NOT DETECTED NOT DETECTED Finl   Cndid tropiclis NOT DETECTED NOT DETECTED Finl    Comment: Performed t [facility], [address], [place], [place] [address]  MRS PCR Screening     Sttus: bnorml   Collection Time: 01/11/18  4:24 M  Result Vlue Ref Rnge Sttus   MRS by PCR POSITIVE () NEGTIVE Finl    Comment:        The GeneXpert MRS ssy (FD pproved for NSL specimens only), is one component of  comprehensive MRS coloniztion surveillnce progrm. It is not intended to dignose MRS infection nor to guide or monitor tretment for MRS infections. RESULT CLLED TO, RED BCK BY ND  VERIFIED WITHPennie Rushing [id number] @ 7989 BY J SCOTTON Performed t [facility] [address], [place], [place] [address]   Body fluid culture     Sttus: None   Collection Time: 01/11/18 10:56 M  Result Vlue Ref Rnge Sttus   Specimen Description   Finl    PLEURL Performed t [facility] [address] [place], [place] [address]    Specil Requests   Finl    NONE Performed t [facility], [place] [address], [place], [place] [address]    Grm Stin   Finl    RRE WBC PRESENT, PREDOMINNTLY MONONUCLER NO ORGNISMS SEEN    Culture   Finl    NO GROWTH 3 DYS Performed t [facility], [place] [address], [place], [place] [address]    Report Sttus 01/14/2018 FINL  Finl  Urine Culture     Sttus: None   Collection Time: 01/13/18  6:45 PM  Result Vlue Ref Rnge Sttus   Specimen Description   Finl    URINE, CLEN CTCH Performed t [facility], [place] [address] [place], [place] [address]    Specil Requests   Finl    NONE Performed t [facility], [place] [address], [place], [place] [address]    Culture   Finl    NO GROWTH Performed t [facility], [place] [address], [place], [place] [address]    Report Sttus 01/15/2018 FINL  Finl  Culture, blood (routine x 2)  Status: None (Preliminary result)   Collection Time: 01/14/18  8:11 AM  Result Value Ref Range Status   Specimen Description   Final    RIGHT ANTECUBITAL Performed at [facility] [address], [place], [place] [address]    Special Requests   Final    BOTTLES DRAWN AEROBIC ONLY Blood Culture adequate volume Performed at [facility] [address] [place], [place] [address]    Culture   Final    NO GROWTH 2 DAYS Performed at [facility] [address] [place], [place] [address]    Report Status PENDING  Incomplete  Culture, blood  (routine x 2)     Status: None (Preliminary result)   Collection Time: 01/14/18  8:42 AM  Result Value Ref Range Status   Specimen Description   Final    BLOOD RIGHT ARM Performed at [facility] [address], [place], [place] [address]    Special Requests   Final    BOTTLES DRAWN AEROBIC ONLY Blood Culture adequate volume Performed at [facility] [address] [place], [place] [address]    Culture   Final    NO GROWTH 2 DAYS Performed at [facility] [address], [place], [place] [address]    Report Status PENDING  Incomplete         Radiology Studies: No results found.      Scheduled Meds: . sodium chloride   Intravenous Once  . azithromycin  500 mg Oral Q24H  . chlorhexidine  15 mL Mouth Rinse BID  . Chlorhexidine Gluconate Cloth  6 each Topical Q0600  . cholecalciferol  1,000 Units Oral Daily  . feeding supplement (ENSURE ENLIVE)  237 mL Oral BID BM  . gabapentin  400 mg Oral QHS  . insulin aspart  0-5 Units Subcutaneous QHS  . insulin aspart  0-9 Units Subcutaneous TID WC  . insulin aspart  5 Units Subcutaneous TID WC  . insulin glargine  16 Units Subcutaneous QHS  . ipratropium-albuterol  3 mL Nebulization BID  . mouth rinse  15 mL Mouth Rinse q12n4p  . mirtazapine  15 mg Oral QHS  . multivitamin with minerals  1 tablet Oral Daily  . mupirocin ointment   Nasal BID  . pantoprazole  40 mg Oral Daily  . predniSONE  40 mg Oral Q breakfast  . rosuvastatin  20 mg Oral Daily  . tamsulosin  0.4 mg Oral QHS   Continuous Infusions: . ceFEPime (MAXIPIME) IV Stopped (01/16/18 1140)     LOS: 6 days    Time spent: over 30 min    Fayrene Helper, MD [facility] Pager.[phone]  If 7PM-7AM, please contact night-coverage [email or web address] Password [email or web address] 01/16/2018, 3:30 PM

## 2018-01-16 NOTE — Progress Notes (Signed)
RN called to room by CNA as pt had coughed up a large amount of red, bloody sputum.  Pt stated he hadnt coughed up any blood prior to this episode, and that he was finally able to clear some of the sputum in his throat.  O2 sat 100% on 2L O2, pt not in distress. pt with hx of lung CA and also receiving lovenox.   Md paged and made aware. RN will monitor.

## 2018-01-17 ENCOUNTER — Inpatient Hospital Stay (HOSPITAL_COMMUNITY): Payer: Medicare Other

## 2018-01-17 HISTORY — PX: IR REMOVAL OF PLURAL CATH W/CUFF: IMG5346

## 2018-01-17 LAB — CBC WITH DIFFERENTIAL/PLATELET
Abs Immature Granulocytes: 0.1 10*3/uL — ABNORMAL HIGH (ref 0.00–0.07)
BASOS ABS: 0 10*3/uL (ref 0.0–0.1)
Basophils Relative: 0 %
Eosinophils Absolute: 0.1 10*3/uL (ref 0.0–0.5)
Eosinophils Relative: 1 %
HCT: 33.3 % — ABNORMAL LOW (ref 39.0–52.0)
Hemoglobin: 10.2 g/dL — ABNORMAL LOW (ref 13.0–17.0)
Immature Granulocytes: 1 %
Lymphocytes Relative: 6 %
Lymphs Abs: 0.6 10*3/uL — ABNORMAL LOW (ref 0.7–4.0)
MCH: 29.4 pg (ref 26.0–34.0)
MCHC: 30.6 g/dL (ref 30.0–36.0)
MCV: 96 fL (ref 80.0–100.0)
MONOS PCT: 8 %
Monocytes Absolute: 0.8 10*3/uL (ref 0.1–1.0)
NEUTROS ABS: 8.3 10*3/uL — AB (ref 1.7–7.7)
Neutrophils Relative %: 84 %
Platelets: 152 10*3/uL (ref 150–400)
RBC: 3.47 MIL/uL — ABNORMAL LOW (ref 4.22–5.81)
RDW: 16.4 % — AB (ref 11.5–15.5)
WBC: 9.9 10*3/uL (ref 4.0–10.5)
nRBC: 0 % (ref 0.0–0.2)

## 2018-01-17 LAB — BASIC METABOLIC PANEL
Anion gap: 7 (ref 5–15)
BUN: 30 mg/dL — ABNORMAL HIGH (ref 8–23)
CO2: 34 mmol/L — ABNORMAL HIGH (ref 22–32)
Calcium: 8.4 mg/dL — ABNORMAL LOW (ref 8.9–10.3)
Chloride: 102 mmol/L (ref 98–111)
Creatinine, Ser: 0.65 mg/dL (ref 0.61–1.24)
GFR calc Af Amer: >60 mL/min (ref >60)
GFR calc non Af Amer: >60 mL/min (ref >60)
Glucose, Bld: 98 mg/dL (ref 70–99)
Potassium: 4 mmol/L (ref 3.5–5.1)
Sodium: 143 mmol/L (ref 135–145)

## 2018-01-17 LAB — CULTURE, BLOOD (ROUTINE X 2): Special Requests: ADEQUATE

## 2018-01-17 LAB — GLUCOSE, CAPILLARY
Glucose-Capillary: 75 mg/dL (ref 70–99)
Glucose-Capillary: 168 mg/dL — ABNORMAL HIGH (ref 70–99)
Glucose-Capillary: 112 mg/dL — ABNORMAL HIGH (ref 70–99)

## 2018-01-17 LAB — PROTIME-INR
INR: 1
Prothrombin Time: 13.2 seconds (ref 11.4–15.2)

## 2018-01-17 MED ORDER — AMOXICILLIN-POT CLAVULANATE 875-125 MG PO TABS: 1.0000 | ORAL_TABLET | Freq: Two times a day (BID) | ORAL | 0 refills | Status: AC

## 2018-01-17 MED ORDER — PREDNISONE 20 MG PO TABS: ORAL_TABLET | ORAL | 0 refills | Status: AC

## 2018-01-17 MED ORDER — HEPARIN SOD (PORK) LOCK FLUSH 100 UNIT/ML IV SOLN
500.0000 [IU] | INTRAVENOUS | Status: AC | PRN
  Administered 2018-01-17: 500 [IU]

## 2018-01-17 MED ORDER — LIDOCAINE HCL 1 % IJ SOLN
INTRAMUSCULAR | Status: AC
  Filled 2018-01-17: qty 20

## 2018-01-17 NOTE — Progress Notes (Signed)
Around 0755 this am, RN was called to room. CNA had taken pt to bathroom and pt had a witnessed fall.  Aide and pt stated pt did not hit head, and the fall was assisted.  Pt lost balance, legs buckled and became weak, and aide assisted pt to the floor.  Skin tear noted to r hand, IV tubing was removed during the fall. IV team consulted.    Pt assessed and no neuro s/s.  Pupils reactive and brisk, no LOC.  No signs of distress. No complaints of pain in any extremities. Vitals stable.  MD made aware and at bedside with pt.  No new orders. RN will continue to monitor.

## 2018-01-17 NOTE — Progress Notes (Signed)
PHARMACY - PHYSICIAN COMMUNICATION CRITICAL VALUE ALERT - BLOOD CULTURE IDENTIFICATION (BCID)  Steve Frank is an 84 y.o. male who presented to [facility] on 01/09/2018 with a chief complaint of SOB  Assessment:    Name of physician (or Provider) Contacted: Dr. Herbert Moors  Current antibiotics: Augmentin 875 mg PO BID on discharge   Changes to prescribed antibiotics recommended:  Likely contaminant  Results for orders placed or performed during the hospital encounter of 01/09/18  Blood Culture ID Panel (Reflexed) (Collected: 01/10/2018  7:44 PM)  Result Value Ref Range   Enterococcus species NOT DETECTED NOT DETECTED   Listeria monocytogenes NOT DETECTED NOT DETECTED   Staphylococcus species NOT DETECTED NOT DETECTED   Staphylococcus aureus (BCID) NOT DETECTED NOT DETECTED   Streptococcus species NOT DETECTED NOT DETECTED   Streptococcus agalactiae NOT DETECTED NOT DETECTED   Streptococcus pneumoniae NOT DETECTED NOT DETECTED   Streptococcus pyogenes NOT DETECTED NOT DETECTED   Acinetobacter baumannii NOT DETECTED NOT DETECTED   Enterobacteriaceae species NOT DETECTED NOT DETECTED   Enterobacter cloacae complex NOT DETECTED NOT DETECTED   Escherichia coli NOT DETECTED NOT DETECTED   Klebsiella oxytoca NOT DETECTED NOT DETECTED   Klebsiella pneumoniae NOT DETECTED NOT DETECTED   Proteus species NOT DETECTED NOT DETECTED   Serratia marcescens NOT DETECTED NOT DETECTED   Haemophilus influenzae NOT DETECTED NOT DETECTED   Neisseria meningitidis NOT DETECTED NOT DETECTED   Pseudomonas aeruginosa NOT DETECTED NOT DETECTED   Candida albicans NOT DETECTED NOT DETECTED   Candida glabrata NOT DETECTED NOT DETECTED   Candida krusei NOT DETECTED NOT DETECTED   Candida parapsilosis NOT DETECTED NOT DETECTED   Candida tropicalis NOT DETECTED NOT DETECTED     Royetta Asal, PharmD, BCPS Pager [phone] 01/17/2018 2:55 PM

## 2018-01-17 NOTE — Progress Notes (Signed)
Pt taken to IR for pleurex cath removal.

## 2018-01-17 NOTE — Discharge Instructions (Signed)
Hospice Introduction Hospice is a service that is designed to provide people who are terminally ill and their families with medical, spiritual, and psychological support. Its aim is to improve your quality of life by keeping you as alert and comfortable as possible. Who will be my providers when I begin hospice care? Hospice teams often include:  A nurse.  A doctor. The hospice doctor will be available for your care, but you can bring your regular doctor or nurse practitioner.  Social workers.  Religious leaders (such as a Clinical biochemist).  Trained volunteers.  What roles will providers play in my care? Hospice is performed by a team of health care professionals and volunteers who:  Help keep you comfortable: ? Hospice can be provided in your home or in a homelike setting. ? The hospice staff works with your family and friends to help meet your needs. ? You will enjoy the support of loved ones by receiving much of your basic care from family and friends.  Provide pain relief and manage your symptoms. The staff supply all necessary medicines and equipment.  Provide companionship when you are alone.  Allow you and your family to rest. They may do light housekeeping, prepare meals, and run errands.  Provide counseling. They will make sure your emotional, spiritual, and social needs and those of your family are being met.  Provide spiritual care: ? Spiritual care will be individualized to meet your needs and your family's needs. ? Spiritual care may involve:  Helping you look at what death means to you.  Helping you say goodbye to your family and friends.  Performing a specific religious ceremony or ritual.  When should hospice care begin? Most people who use hospice are believed to have fewer than 6 months to live.  Your family and health care providers can help you decide when hospice services should begin.  If your condition improves, you may discontinue the program.  What  should I consider before selecting a program? Most hospice programs are run by nonprofit, independent organizations. Some are affiliated with hospitals, nursing homes, or home health care agencies. Hospice programs can take place in the home or at a hospice center, hospital, or skilled nursing facility. When choosing a hospice program, ask the following questions:  What services are available to me?  What services will be offered to my loved ones?  How involved will my loved ones be?  How involved will my health care provider be?  Who makes up the hospice care team? How are they trained or screened?  How will my pain and symptoms be managed?  If my circumstances change, can the services be provided in a different setting, such as my home or in the hospital?  Is the program reviewed and licensed by the state or certified in some other way?  Where can I learn more about hospice? You can learn about existing hospice programs in your area from your health care providers. You can also read more about hospice online. The websites of the following organizations contain helpful information:  The [facility] and Palliative Care Organization [facility]).  The Hospice Association of America ([place]).  The [place].  The American Cancer Society (ACS).  Hospice Net.  This information is not intended to replace advice given to you by your health care provider. Make sure you discuss any questions you have with your health care provider. Document Released: 05/13/2003 Document Revised: 09/10/2015 Document Reviewed: 12/04/2012 Elsevier Interactive Patient Education  2017 [employer].

## 2018-01-17 NOTE — Plan of Care (Signed)
Pt is stable. Medications administered as ordered. No acute distress. Pt comfortably sleeping at this time. Problem: Education: Goal: Knowledge of General Education information will improve Description Including pain rating scale, medication(s)/side effects and non-pharmacologic comfort measures Outcome: Progressing   Problem: Health Behavior/Discharge Planning: Goal: Ability to manage health-related needs will improve Outcome: Progressing   Problem: Clinical Measurements: Goal: Ability to maintain clinical measurements within normal limits will improve Outcome: Progressing Goal: Will remain free from infection Outcome: Progressing Goal: Diagnostic test results will improve Outcome: Progressing Goal: Respiratory complications will improve Outcome: Progressing Goal: Cardiovascular complication will be avoided Outcome: Progressing   Problem: Activity: Goal: Risk for activity intolerance will decrease Outcome: Progressing   Problem: Nutrition: Goal: Adequate nutrition will be maintained Outcome: Progressing   Problem: Coping: Goal: Level of anxiety will decrease Outcome: Progressing   Problem: Elimination: Goal: Will not experience complications related to bowel motility Outcome: Progressing Goal: Will not experience complications related to urinary retention Outcome: Progressing   Problem: Pain Managment: Goal: General experience of comfort will improve Outcome: Progressing   Problem: Safety: Goal: Ability to remain free from injury will improve Outcome: Progressing   Problem: Skin Integrity: Goal: Risk for impaired skin integrity will decrease Outcome: Progressing

## 2018-01-17 NOTE — Progress Notes (Signed)
Pt back on unit from IR. Port-a-cath being deaccessed by IV team. Pt to be escorted by wheelchair to main lobby.

## 2018-01-17 NOTE — Progress Notes (Signed)
VAST RN consulted to look at pt's line, as he fell this am. Upon entering room, the tubing on the PHN was clamped with a hemostat (the line had snapped when pt fell). Pt without complaints. PHN deaccessed and reaccessed per protocol. GBR and flushing without difficulty.

## 2018-01-17 NOTE — Discharge Summary (Signed)
Physician Discharge Summary  Steve Frank HBZ:[id number] DOB: 04-26-1931 DOA: 01/09/2018  PCP: [name], MD  Admit date: 01/09/2018 Discharge date: 01/17/2018  Admitted From: Home Disposition:  Home with hospice  Recommendations for Outpatient Follow-up:  1. Please follow-up with hospice  Home Health: None Equipment/Devices: 3 L nasal cannula  Discharge Condition: Hospice CODE STATUS: DNR Diet recommendation: Regular diet  Brief/Interim Summary:  #) Acute on chronic hypoxic respiratory failure: Patient was admitted on 01/09/2018 with fever and hypoxia.  Patient was also noted to have a left-sided Pleurx catheter that was not draining.  Patient was noted to have also now new right-sided pleural effusion.  This was removed via thoracentesis on 01/14/2018.  Patient was initially on IV cefepime, azithromycin, vancomycin.  CT scan shows no new left lung consolidation.  It was thought that there was a combination of pneumonitis from immunotherapy, possible infection, progression of his malignancy.  Patient was seen by palliative care and family decided to take the patient home and see hospice.  Patient was discharged on a slow taper of prednisone over 2 to 4 weeks and oral Augmentin to complete a course of antibiotics and steroids for both possible infection as well as pneumonitis.  Patient was DNR during this hospitalization.  Blood cultures grew 1 out of 2 unusual gram-negative rod for which the bio fire was negative.  Suspect this was most likely a contaminant.  Repeat blood cultures were negative.  Patient per above was discharged on antibiotics.  He was seen by interventional radiology and did have his Pleurx removed on the left side.  #) COPD: Patient was continued on steroids and duo nebs.  #) Type 2 diabetes: Patient was maintained on Lantus as well as sliding scale here.  He may continue this at home.  #) Anemia: Patient did have baseline anemia with a drop gradually.  This was  thought to be anemia of chronic disease.  Patient received 1 unit packed red blood cells on 01/15/2018.  #) Metastatic small cell lung cancer: This was followed by Dr. Earlie Server.  Due to his significant progression of disease and debility he is not considered a candidate for any other treatments.  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   Increase activity slowly   Complete by:  As directed      Allergies as of 01/17/2018      Reactions   Ciprofloxacin Nausea And Vomiting      Medication List    STOP taking these medications   HUMALOG 100 UNIT/ML injection Generic drug:  insulin lispro   lidocaine-prilocaine cream Commonly known as:  EMLA   rosuvastatin 20 MG tablet Commonly known as:  CRESTOR     TAKE these medications   acetaminophen 325 MG tablet Commonly known as:  TYLENOL Take 650 mg by mouth every 6 (six) hours as needed for mild pain, fever or headache (for pain/headaches.).   albuterol 108 (90 Base) MCG/ACT inhaler Commonly known as:  PROVENTIL HFA;VENTOLIN HFA Inhale 2 puffs into the lungs every 6 (six) hours as needed (for wheezing/shortness of breath).   amoxicillin-clavulanate 875-125 MG tablet Commonly known as:  AUGMENTIN Take 1 tablet by mouth 2 (two)  times daily for 5 days.   B-D INS SYR ULTRAFINE 1CC/31G 31G X 5/16" 1 ML Misc Generic drug:  Insulin Syringe-Needle U-100   clotrimazole-betamethasone cream Commonly known as:  LOTRISONE Apply 1 application topically 2 (two) times daily.   docusate sodium 100 MG capsule Commonly known as:  COLACE Take 100 mg by mouth 2 (two) times daily as needed (for constipation.).   freestyle lancets   gabapentin 400 MG capsule Commonly known as:  NEURONTIN Take 400 mg by mouth at bedtime.   guaiFENesin-dextromethorphan 100-10 MG/5ML syrup Commonly known as:  ROBITUSSIN DM Take 15 mLs every 4 (four) hours as needed by mouth for cough.   LANTUS 100 UNIT/ML injection Generic drug:  insulin glargine Inject 16 Units into the skin at bedtime.   magnesium hydroxide 400 MG/5ML suspension Commonly known as:  MILK OF MAGNESIA Take 30 mLs by mouth daily as needed (for constipation.).   mirtazapine 15 MG tablet Commonly known as:  REMERON Take 15 mg by mouth at bedtime.   multivitamin with minerals Tabs tablet Take 1 tablet by mouth daily.   neomycin-bacitracin-polymyxin [phone] ointment Apply 1 application topically at bedtime.   omeprazole 20 MG capsule Commonly known as:  PRILOSEC Take 20 mg by mouth at bedtime.   polyethylene glycol packet Commonly known as:  MIRALAX / GLYCOLAX Take 17 g by mouth daily as needed (for constipation.).   predniSONE 20 MG tablet Commonly known as:  DELTASONE Take 2 tablets (40 mg total) by mouth daily with breakfast for 3 days, THEN 1.5 tablets (30 mg total) daily with breakfast for 3 days, THEN 1 tablet (20 mg total) daily with breakfast for 3 days, THEN 0.5 tablets (10 mg total) daily with breakfast for 5 days. Start taking on:  01/17/2018   prochlorperazine 10 MG tablet Commonly known as:  COMPAZINE Take 1 tablet (10 mg total) by mouth every 6 (six) hours as needed for nausea or vomiting.   tamsulosin 0.4 MG Caps capsule Commonly known as:   FLOMAX Take 0.4 mg by mouth at bedtime.   TRELEGY ELLIPTA 100-62.5-25 MCG/INH Aepb Generic drug:  Fluticasone-Umeclidin-Vilant Inhale 1 puff into the lungs daily.   triamcinolone 0.025 % ointment Commonly known as:  KENALOG Apply 1 application 2 (two) times daily topically.   VITAMIN D-1000 MAX ST 25 MCG (1000 UT) tablet Generic drug:  Cholecalciferol Take 1,000 Units by mouth daily.      Follow-up Information    [facility] Follow up.   Why:  hospice services Contact information: [place] [address] [phone]          Allergies  Allergen Reactions  . Ciprofloxacin Nausea And Vomiting    Consultations: IR PCCM Palliative Care   Procedures/Studies: Dg Chest 2 View  Result Date: 01/16/2018 CLINICAL DATA:  Hemoptysis.  History of lung cancer. EXAM: CHEST - 2 VIEW COMPARISON:  Chest radiograph January 11, 2018 FINDINGS: Cardiac silhouette is mildly enlarged. Status post median sternotomy.  Calcified aortic arch. Redemonstration of LEFT perihilar mass. Small to moderate RIGHT and small LEFT pleural effusions with underlying bibasilar consolidation. LEFT chest tube in situ. RIGHT single-lumen chest Port-A-Cath. Resolution of pneumothorax. Soft tissue planes and included osseous structures are unchanged. IMPRESSION: Small to moderate RIGHT and small LEFT pleural effusions, similar underlying consolidation. LEFT chest tube in place. Stable appearance of LEFT perihilar mass. Aortic Atherosclerosis (ICD10-I70.0). Electronically Signed   By: Elon Alas M.D.   On: 01/16/2018 20:30   Dg Chest 2 View  Result Date: 01/09/2018 CLINICAL DATA:  History of left lung cancer.  Cough. EXAM: CHEST - 2 VIEW COMPARISON:  CT scan December 08, 2017 FINDINGS: There is a small right pleural effusion which is similar in the interval. Opacity under the effusion is likely atelectasis. Opacity in the left perihilar region is at the site of the patient's  treated malignancy. Hazy opacity is seen in the lower left chest with a left-sided effusion also noted. No pneumothorax. Stable right Port-A-Cath. No other acute interval changes. IMPRESSION: 1. Bilateral pleural effusions, left greater than right. 2. Opacity in left perihilar region is at least partially explained by the patient's known treated malignancy. 3. Hazy opacity more inferiorly in the left lung may represent layering effusion and underlying opacity/atelectasis. Electronically Signed   By: Dorise Bullion III M.D   On: 01/09/2018 17:13   Ct Chest Wo Contrast  Result Date: 01/10/2018 CLINICAL DATA:  pleural effusion, pleurx drain not functioning EXAM: CT CHEST WITHOUT CONTRAST TECHNIQUE: Multidetector CT imaging of the chest was performed following the standard protocol without IV contrast. COMPARISON:  Chest CT dated 12/08/2017. FINDINGS: Cardiovascular: Extensive aortic atherosclerosis. Heart size is within normal limits. Surgical changes of CABG. Mediastinum/Nodes: Scattered small lymph nodes within the mediastinum. Largest lymph node is again seen within the subcarinal space, measuring 1.7 cm short axis dimension, unchanged in the short-term interval. Additional lymph nodes within the mediastinum and perihilar regions were better visualized on the previous study due to IV contrast administration. Esophagus is unremarkable. Trachea appears normal. Lungs/Pleura: RIGHT pleural effusion, moderate to large in size, slightly increased in size compared to the previous exam, with associated atelectasis. Large consolidation within the LEFT lung, involving all lobes but centered at the LEFT lung base, significantly increased in size/extent compared to the previous exam. There is a small loculated appearing pleural effusion along the LEFT lateral chest wall, new compared to the previous exam. Emphysematous changes bilaterally, mild to moderate in degree, upper lobe predominant. Upper Abdomen: Limited images of  the upper abdomen are unremarkable. Musculoskeletal: Compression fracture deformity of the T6 vertebral body is stable in the short-term interval. Healing/healed fracture of the LEFT eleventh posterior rib. No new/acute osseous abnormality. IMPRESSION: 1. Large consolidation within the LEFT lung, involving all lobes but centered at the LEFT lung base, significantly increased in size/extent compared to the previous exam, compatible with worsening pneumonia, alternatively severe post radiation change. As discussed on the previous chest CT report, some component of lymphangitic spread of tumor cannot be excluded. 2. RIGHT pleural effusion, moderate to large in size, slightly increased in size compared to the previous exam, with associated atelectasis. 3. Small loculated appearing pleural effusion along the LEFT lateral chest wall, new compared to the previous exam. Pleural drain extends to the posterior aspect of the LEFT lung base. 4. Emphysematous changes, mild to moderate in degree, upper lobe predominant. 5. Stable compression fracture deformity of the T6 vertebral body. 6. Healing/healed fracture of the LEFT eleventh posterior  rib. Aortic Atherosclerosis (ICD10-I70.0) and Emphysema (ICD10-J43.9). Electronically Signed   By: Franki Cabot M.D.   On: 01/10/2018 17:27   Dg Chest Port 1 View  Result Date: 01/11/2018 CLINICAL DATA:  Post right thoracentesis EXAM: PORTABLE CHEST 1 VIEW COMPARISON:  CT chest of 01/10/2018 and chest x-ray of 01/09/2018 FINDINGS: There is a small right apical pneumothorax present after right thoracentesis. Some of the right pleural effusion has been evacuated. Left effusion also remains with bibasilar atelectasis present. Left mid lung opacity is stable. Heart size is stable. Right-sided Port-A-Cath tip overlies the lower SVC. IMPRESSION: 1. Small right apical pneumothorax after right thoracentesis. 2. Some reduction in right pleural effusion after thoracentesis with bilateral pleural  effusions and bibasilar atelectasis remaining. Electronically Signed   By: Ivar Drape M.D.   On: 01/11/2018 11:30   R sided thoracentesis on 01/11/18   Subjective:   Discharge Exam: Vitals:   01/16/18 2220 01/17/18 0755  BP:  (!) 109/58  Pulse:  80  Resp:  14  Temp:    SpO2: 96% 92%   Vitals:   01/16/18 1415 01/16/18 2057 01/16/18 2220 01/17/18 0755  BP: (!) 148/80 (!) 162/95  (!) 109/58  Pulse: 77 (!) 55  80  Resp: 18 18  14   Temp: 97.8 F (36.6 C) 97.7 F (36.5 C)    TempSrc:  Axillary    SpO2: 100% 97% 96% 92%  Weight:      Height:        General: Pt is alert, awake, not in acute distress Cardiovascular: Regular rate and rhythm, no murmurs Respiratory: Minimal lung sounds on left, scattered rhonchi on right Abdominal: Soft, NT, ND, bowel sounds + Extremities: no edema no visible skin    The results of significant diagnostics from this hospitalization (including imaging, microbiology, ancillary and laboratory) are listed below for reference.     Microbiology: Recent Results (from the past 240 hour(s))  Culture, blood (routine x 2)     Status: None   Collection Time: 01/10/18  7:44 PM  Result Value Ref Range Status   Specimen Description   Final    BLOOD RIGHT ARM Performed at [facility], [place] [address], [place], [place] [address]    Special Requests   Final    BOTTLES DRAWN AEROBIC AND ANAEROBIC Blood Culture adequate volume Performed at [facility] [address], [place], [place] [address]    Culture   Final    NO GROWTH 5 DAYS Performed at [facility], [place] [address] [place], [place] [address]    Report Status 01/15/2018 FINAL  Final  Culture, blood (routine x 2)     Status: None (Preliminary result)   Collection Time: 01/10/18  7:44 PM  Result Value Ref Range Status   Specimen Description   Final    BLOOD LEFT ARM Performed at [place] [facility] [address], [place], [place]  [address]    Special Requests   Final    BOTTLES DRAWN AEROBIC AND ANAEROBIC Blood Culture adequate volume Performed at [facility] [address] [place], [place] [address]    Culture  Setup Time   Final    GRAM NEGATIVE RODS AEROBIC BOTTLE ONLY CRITICAL RESULT CALLED TO, READ BACK BY AND VERIFIED WITH: L. POINDEXTER,PHARMD 0211 01/14/2018 T. TYSOR    Culture   Final    Lonell Grandchild NEGATIVE RODS Launa Grill FOR ID AND SUSCEPTIBILITY Performed at [facility], [address] [place], [place] [address]    Report Status PENDING  Incomplete  Blood Culture ID Panel (Reflexed)     Status: None   Collection Time: 01/10/18  7:44 PM  Result Value Ref Range Status   Enterococcus species NOT DETECTED NOT DETECTED Final   Listeria monocytogenes NOT DETECTED NOT DETECTED Final   Staphylococcus species NOT DETECTED NOT DETECTED Final   Staphylococcus aureus (BCID) NOT DETECTED NOT DETECTED Final   Streptococcus species NOT DETECTED NOT DETECTED Final   Streptococcus agalactiae NOT DETECTED NOT DETECTED Final   Streptococcus pneumoniae NOT DETECTED NOT DETECTED Final   Streptococcus pyogenes NOT DETECTED NOT DETECTED Final   Acinetobacter baumannii NOT DETECTED NOT DETECTED Final   Enterobacteriaceae species NOT DETECTED NOT DETECTED Final   Enterobacter cloacae complex NOT DETECTED NOT DETECTED Final   Escherichia coli NOT DETECTED NOT DETECTED Final   Klebsiella oxytoca NOT DETECTED NOT DETECTED Final   Klebsiella pneumoniae NOT DETECTED NOT DETECTED Final   Proteus species NOT DETECTED NOT DETECTED Final   Serratia marcescens NOT DETECTED NOT DETECTED Final   Haemophilus influenzae NOT DETECTED NOT DETECTED Final   Neisseria meningitidis NOT DETECTED NOT DETECTED Final   Pseudomonas aeruginosa NOT DETECTED NOT DETECTED Final   Candida albicans NOT DETECTED NOT DETECTED Final   Candida glabrata NOT DETECTED NOT DETECTED Final   Candida krusei NOT DETECTED NOT  DETECTED Final   Candida parapsilosis NOT DETECTED NOT DETECTED Final   Candida tropicalis NOT DETECTED NOT DETECTED Final    Comment: Performed at [facility], [place] [address], [place], [place] [address]  MRSA PCR Screening     Status: Abnormal   Collection Time: 01/11/18  4:24 AM  Result Value Ref Range Status   MRSA by PCR POSITIVE (A) NEGATIVE Final    Comment:        The GeneXpert MRSA Assay (FDA approved for NASAL specimens only), is one component of a comprehensive MRSA colonization surveillance program. It is not intended to diagnose MRSA infection nor to guide or monitor treatment for MRSA infections. RESULT CALLED TO, READ BACK BY AND VERIFIED WITHPennie Rushing [id number] @ [id number] BY J SCOTTON Performed at [facility] [address], [place], [place] [address]   Body fluid culture     Status: None   Collection Time: 01/11/18 10:56 AM  Result Value Ref Range Status   Specimen Description   Final    PLEURAL Performed at [facility] [address] [place], [place] [address]    Special Requests   Final    NONE Performed at [facility], [place] [address] [place], [place] [address]    Gram Stain   Final    RARE WBC PRESENT, PREDOMINANTLY MONONUCLEAR NO ORGANISMS SEEN    Culture   Final    NO GROWTH 3 DAYS Performed at [facility], [place] [address] [place], [place] [address]    Report Status 01/14/2018 FINAL  Final  Urine Culture     Status: None   Collection Time: 01/13/18  6:45 PM  Result Value Ref Range Status   Specimen Description   Final    URINE, CLEAN CATCH Performed at [facility], [place] [address] [place], [place] [address]    Special Requests   Final    NONE Performed at [facility], [place] [address], [place], [place] [address]    Culture   Final    NO  GROWTH Performed at [facility], [place] [address] [place], [place] [address]     Report Status 01/15/2018 FINAL  Final  Culture, blood (routine x 2)     Status: None (Preliminary result)   Collection Time: 01/14/18  8:11 AM  Result Value Ref Range Status   Specimen Description   Final    RIGHT ANTECUBITAL Performed at [facility] [address], [place], [place] [address]    Special Requests   Final    BOTTLES DRAWN AEROBIC ONLY Blood Culture adequate volume Performed at [facility] [address] [place], [place] [address]    Culture   Final    NO GROWTH 2 DAYS Performed at [facility] [address], [place], [place] [address]    Report Status PENDING  Incomplete  Culture, blood (routine x 2)     Status: None (Preliminary result)   Collection Time: 01/14/18  8:42 AM  Result Value Ref Range Status   Specimen Description   Final    BLOOD RIGHT ARM Performed at [facility] [address] [place], [place] [address]    Special Requests   Final    BOTTLES DRAWN AEROBIC ONLY Blood Culture adequate volume Performed at [facility] [address], [place], [place] [address]    Culture   Final    NO GROWTH 2 DAYS Performed at [facility] [address] [place], [place] [address]    Report Status PENDING  Incomplete     Labs: BNP (last 3 results) No results for input(s): BNP in the last 8760 hours. Basic Metabolic Panel: Recent Labs  Lab 01/11/18 0345 01/12/18 0500 01/13/18 0330 01/14/18 0339 01/16/18 0945 01/17/18 0307  NA 138 140 142 141 139 143  K 4.2 4.4 4.1 4.2 4.1 4.0  CL 102 102 105 105 99 102  CO2 29 28 29  32 34* 34*  GLUCOSE 215* 208* 115* 173* 131* 98  BUN 26* 49* 60* 51* 32* 30*  CREATININE 0.81 1.01 1.06 0.93 0.69 0.65  CALCIUM 8.6* 8.7* 8.7* 8.4* 8.2* 8.4*  MG 1.8  --   --   --   --   --    Liver Function Tests: Recent Labs  Lab 01/11/18 0345  AST 16  ALT 16  ALKPHOS 51  BILITOT 0.6  PROT 6.3*  ALBUMIN 2.4*   No results for  input(s): LIPASE, AMYLASE in the last 168 hours. No results for input(s): AMMONIA in the last 168 hours. CBC: Recent Labs  Lab 01/13/18 0330 01/14/18 0339 01/15/18 0329 01/15/18 1900 01/16/18 0354 01/17/18 0307  WBC 15.5* 12.0* 8.9  --  10.9* 9.9  NEUTROABS 14.5* 10.9* 7.6  --  9.3* 8.3*  HGB 8.2* 7.7* 7.6* 10.3* 10.0* 10.2*  HCT 27.6* 25.4* 25.7* 33.5* 32.4* 33.3*  MCV 96.2 98.1 96.3  --  92.6 96.0  PLT 213 180 169  --  164 152   Cardiac Enzymes: No results for input(s): CKTOTAL, CKMB, CKMBINDEX, TROPONINI in the last 168 hours. BNP: Invalid input(s): POCBNP CBG: Recent Labs  Lab 01/16/18 0735 01/16/18 1255 01/16/18 1637 01/16/18 2054 01/17/18 0733  GLUCAP 86 276* 185* 167* 75   D-Dimer No results for input(s): DDIMER in the last 72 hours. Hgb A1c No results for input(s): HGBA1C in the last 72 hours. Lipid Profile No results for input(s): CHOL, HDL, LDLCALC, TRIG, CHOLHDL, LDLDIRECT in the last 72 hours. Thyroid function studies No  results for input(s): TSH, T4TOTAL, T3FREE, THYROIDAB in the last 72 hours.  Invalid input(s): FREET3 Anemia work up Recent Labs    01/15/18 0823  VITAMINB12 1,353*  FOLATE 15.1  FERRITIN 178  TIBC 201*  IRON 54   Urinalysis    Component Value Date/Time   COLORURINE YELLOW 01/13/2018 1845   APPEARANCEUR CLEAR 01/13/2018 1845   LABSPEC 1.017 01/13/2018 1845   PHURINE 5.0 01/13/2018 1845   GLUCOSEU >=500 (A) 01/13/2018 1845   HGBUR NEGATIVE 01/13/2018 1845   BILIRUBINUR NEGATIVE 01/13/2018 1845   KETONESUR NEGATIVE 01/13/2018 1845   PROTEINUR NEGATIVE 01/13/2018 1845   NITRITE NEGATIVE 01/13/2018 1845   LEUKOCYTESUR NEGATIVE 01/13/2018 1845   Sepsis Labs Invalid input(s): PROCALCITONIN,  WBC,  LACTICIDVEN Microbiology Recent Results (from the past 240 hour(s))  Culture, blood (routine x 2)     Status: None   Collection Time: 01/10/18  7:44 PM  Result Value Ref Range Status   Specimen Description   Final    BLOOD  RIGHT ARM Performed at [facility], [place] [address], [place], [place] [address]    Special Requests   Final    BOTTLES DRAWN AEROBIC AND ANAEROBIC Blood Culture adequate volume Performed at [place] [address], [place], [place] [address]    Culture   Final    NO GROWTH 5 DAYS Performed at [facility], [place] [address] [place], [place] [address]    Report Status 01/15/2018 FINAL  Final  Culture, blood (routine x 2)     Status: None (Preliminary result)   Collection Time: 01/10/18  7:44 PM  Result Value Ref Range Status   Specimen Description   Final    BLOOD LEFT ARM Performed at [facility] [place] [place] [address] [place], [place] [address]    Special Requests   Final    BOTTLES DRAWN AEROBIC AND ANAEROBIC Blood Culture adequate volume Performed at [facility] [address], [place], [place] [address]    Culture  Setup Time   Final    GRAM NEGATIVE RODS AEROBIC BOTTLE ONLY CRITICAL RESULT CALLED TO, READ BACK BY AND VERIFIED WITH: L. POINDEXTER,PHARMD 0211 01/14/2018 T. TYSOR    Culture   Final    Lonell Grandchild NEGATIVE RODS Launa Grill FOR ID AND SUSCEPTIBILITY Performed at [facility], [place] [address], [place], [place] [address]    Report Status PENDING  Incomplete  Blood Culture ID Panel (Reflexed)     Status: None   Collection Time: 01/10/18  7:44 PM  Result Value Ref Range Status   Enterococcus species NOT DETECTED NOT DETECTED Final   Listeria monocytogenes NOT DETECTED NOT DETECTED Final   Staphylococcus species NOT DETECTED NOT DETECTED Final   Staphylococcus aureus (BCID) NOT DETECTED NOT DETECTED Final   Streptococcus species NOT DETECTED NOT DETECTED Final   Streptococcus agalactiae NOT DETECTED NOT DETECTED Final   Streptococcus pneumoniae NOT DETECTED NOT DETECTED Final   Streptococcus pyogenes NOT DETECTED NOT DETECTED Final   Acinetobacter baumannii NOT DETECTED NOT  DETECTED Final   Enterobacteriaceae species NOT DETECTED NOT DETECTED Final   Enterobacter cloacae complex NOT DETECTED NOT DETECTED Final   Escherichia coli NOT DETECTED NOT DETECTED Final   Klebsiella oxytoca NOT DETECTED NOT DETECTED Final   Klebsiella pneumoniae NOT DETECTED NOT DETECTED Final   Proteus species NOT DETECTED NOT DETECTED Final   Serratia marcescens NOT DETECTED NOT DETECTED Final   Haemophilus influenzae NOT DETECTED NOT  DETECTED Final   Neisseria meningitidis NOT DETECTED NOT DETECTED Final   Pseudomonas aeruginosa NOT DETECTED NOT DETECTED Final   Candida albicans NOT DETECTED NOT DETECTED Final   Candida glabrata NOT DETECTED NOT DETECTED Final   Candida krusei NOT DETECTED NOT DETECTED Final   Candida parapsilosis NOT DETECTED NOT DETECTED Final   Candida tropicalis NOT DETECTED NOT DETECTED Final    Comment: Performed at [facility], [place] [address] [place], [place] [address]  MRSA PCR Screening     Status: Abnormal   Collection Time: 01/11/18  4:24 AM  Result Value Ref Range Status   MRSA by PCR POSITIVE (A) NEGATIVE Final    Comment:        The Gene[facility]pert MRSA Assay (FDA approved for NASAL specimens only), is one component of a comprehensive MRSA colonization surveillance program. It is not intended to diagnose MRSA infection nor to guide or monitor treatment for MRSA infections. RESULT CALLED TO, READ BACK BY AND VERIFIED WITHPennie Rushing [id number] @ [id number] BY J SCOTTON Performed at [facility] [address], [place], [place] [address]   Body fluid culture     Status: None   Collection Time: 01/11/18 10:56 AM  Result Value Ref Range Status   Specimen Description   Final    PLEURAL Performed at [name] [address], [place], [place] [address]    Special Requests   Final    NONE Performed at [facility], [place] [address] [place], [place] [address]    Gram Stain   Final     RARE WBC PRESENT, PREDOMINANTLY MONONUCLEAR NO ORGANISMS SEEN    Culture   Final    NO GROWTH 3 DAYS Performed at [facility], [place] [address], [place], [place] [address]    Report Status 01/14/2018 FINAL  Final  Urine Culture     Status: None   Collection Time: 01/13/18  6:45 PM  Result Value Ref Range Status   Specimen Description   Final    URINE, CLEAN CATCH Performed at [facility], [place] [address] [place] [address], [place], [place] [address]    Special Requests   Final    NONE Performed at [facility], [place] [address], [place], [place] [address]    Culture   Final    NO GROWTH Performed at [facility], [place] [address], [place], [place] [address]    Report Status 01/15/2018 FINAL  Final  Culture, blood (routine x 2)     Status: None (Preliminary result)   Collection Time: 01/14/18  8:11 AM  Result Value Ref Range Status   Specimen Description   Final    RIGHT ANTECUBITAL Performed at [place] [address] [place], [place] [address]    Special Requests   Final    BOTTLES DRAWN AEROBIC ONLY Blood Culture adequate volume Performed at [facility] [address], [place], [place] [address]    Culture   Final    NO GROWTH 2 DAYS Performed at [facility] [address], [place], [place] [address]    Report Status PENDING  Incomplete  Culture, blood (routine x 2)     Status: None (Preliminary result)   Collection Time: 01/14/18  8:42 AM  Result Value Ref Range Status   Specimen Description   Final    BLOOD RIGHT ARM Performed at [facility]  [address] [place], [place] [address]    Special Requests   Final    BOTTLES DRAWN AEROBIC ONLY Blood Culture adequate volume Performed at [facility] [address] [place], [place] [address]    Culture   Final    NO GROWTH 2 DAYS Performed at [facility] [address],  [place], [place] [address]    Report Status PENDING  Incomplete     Time coordinating discharge: 29  SIGNED:   Cristy Folks, MD  [facility] 01/17/2018, 11:38 AM  If 7PM-7AM, please contact night-coverage [email or web address] Password [email or web address]

## 2018-01-17 NOTE — Procedures (Signed)
Interventional Radiology Procedure Note  Procedure: Removal of left PleurX catheter  Complications: None  Estimated Blood Loss: < 10 mL  Findings: PleurX catheter removed from left pleural space using sharp and blunt dissection. Exit incision extended due to high cuff positioning in subcutaneous tunnel.  Entire catheter successfully removed.  Steve Frank. Kathlene Cote, M.D Pager:  [phone]

## 2018-01-17 NOTE — Plan of Care (Signed)
Pt alert and oriented, with moments of confusion.  [place] this am in bathroom, doing well. Met with hospice, plan to d/c home this afternoon. Awaiting removal of pleurex catheter.  RN will monitor.

## 2018-01-18 ENCOUNTER — Encounter (HOSPITAL_COMMUNITY): Payer: Self-pay | Admitting: Interventional Radiology

## 2018-01-18 ENCOUNTER — Other Ambulatory Visit: Payer: Medicare Other

## 2018-01-19 ENCOUNTER — Telehealth: Payer: Self-pay | Admitting: Medical Oncology

## 2018-01-19 ENCOUNTER — Ambulatory Visit: Payer: Medicare Other | Attending: Urology | Admitting: Urology

## 2018-01-19 LAB — BPAM RBC
BLOOD PRODUCT EXPIRATION DATE: 201912302359
Blood Product Expiration Date: 201912302359
ISSUE DATE / TIME: 201912091354
Unit Type and Rh: 6200
Unit Type and Rh: 6200

## 2018-01-19 LAB — TYPE AND SCREEN
ABO/RH(D): A POS
Antibody Screen: POSITIVE
Unit division: 0
Unit division: 0

## 2018-01-19 LAB — CULTURE, BLOOD (ROUTINE X 2)
CULTURE: NO GROWTH
Culture: NO GROWTH
Special Requests: ADEQUATE
Special Requests: ADEQUATE

## 2018-01-19 NOTE — Telephone Encounter (Signed)
Steve Frank called and she said Pardeep wants to go on [employer]. She requests to cancel all appointments.  She said Hospice has already come out.

## 2018-01-20 NOTE — Telephone Encounter (Signed)
Thank you for the update!

## 2018-01-23 ENCOUNTER — Other Ambulatory Visit: Payer: Medicare Other

## 2018-01-23 ENCOUNTER — Ambulatory Visit: Payer: Medicare Other | Admitting: Internal Medicine

## 2018-01-23 ENCOUNTER — Ambulatory Visit: Payer: Medicare Other

## 2018-02-13 ENCOUNTER — Inpatient Hospital Stay: Payer: TRICARE For Life (TFL)

## 2018-02-13 ENCOUNTER — Inpatient Hospital Stay: Payer: TRICARE For Life (TFL) | Attending: Internal Medicine | Admitting: Medical

## 2018-03-06 ENCOUNTER — Other Ambulatory Visit: Payer: Medicare Other

## 2018-03-06 ENCOUNTER — Ambulatory Visit: Payer: Medicare Other | Admitting: Internal Medicine

## 2018-03-06 ENCOUNTER — Ambulatory Visit: Payer: Medicare Other

## 2018-03-10 DEATH — deceased

## 2018-08-21 ENCOUNTER — Encounter: Payer: Self-pay | Admitting: Radiation Therapy

## 2019-08-12 IMAGING — CR DG CHEST 2V
2 series · 2 of 2 positions shown · non-contrast
Comparison: Chest 09/13/2016. PET-CT 09/20/2016. CT chest
08/04/2016

CLINICAL DATA: Lung mass. Preoperative for lung biopsy. History of
hypertension.

EXAM:
CHEST  2 VIEW

[w chest lat]
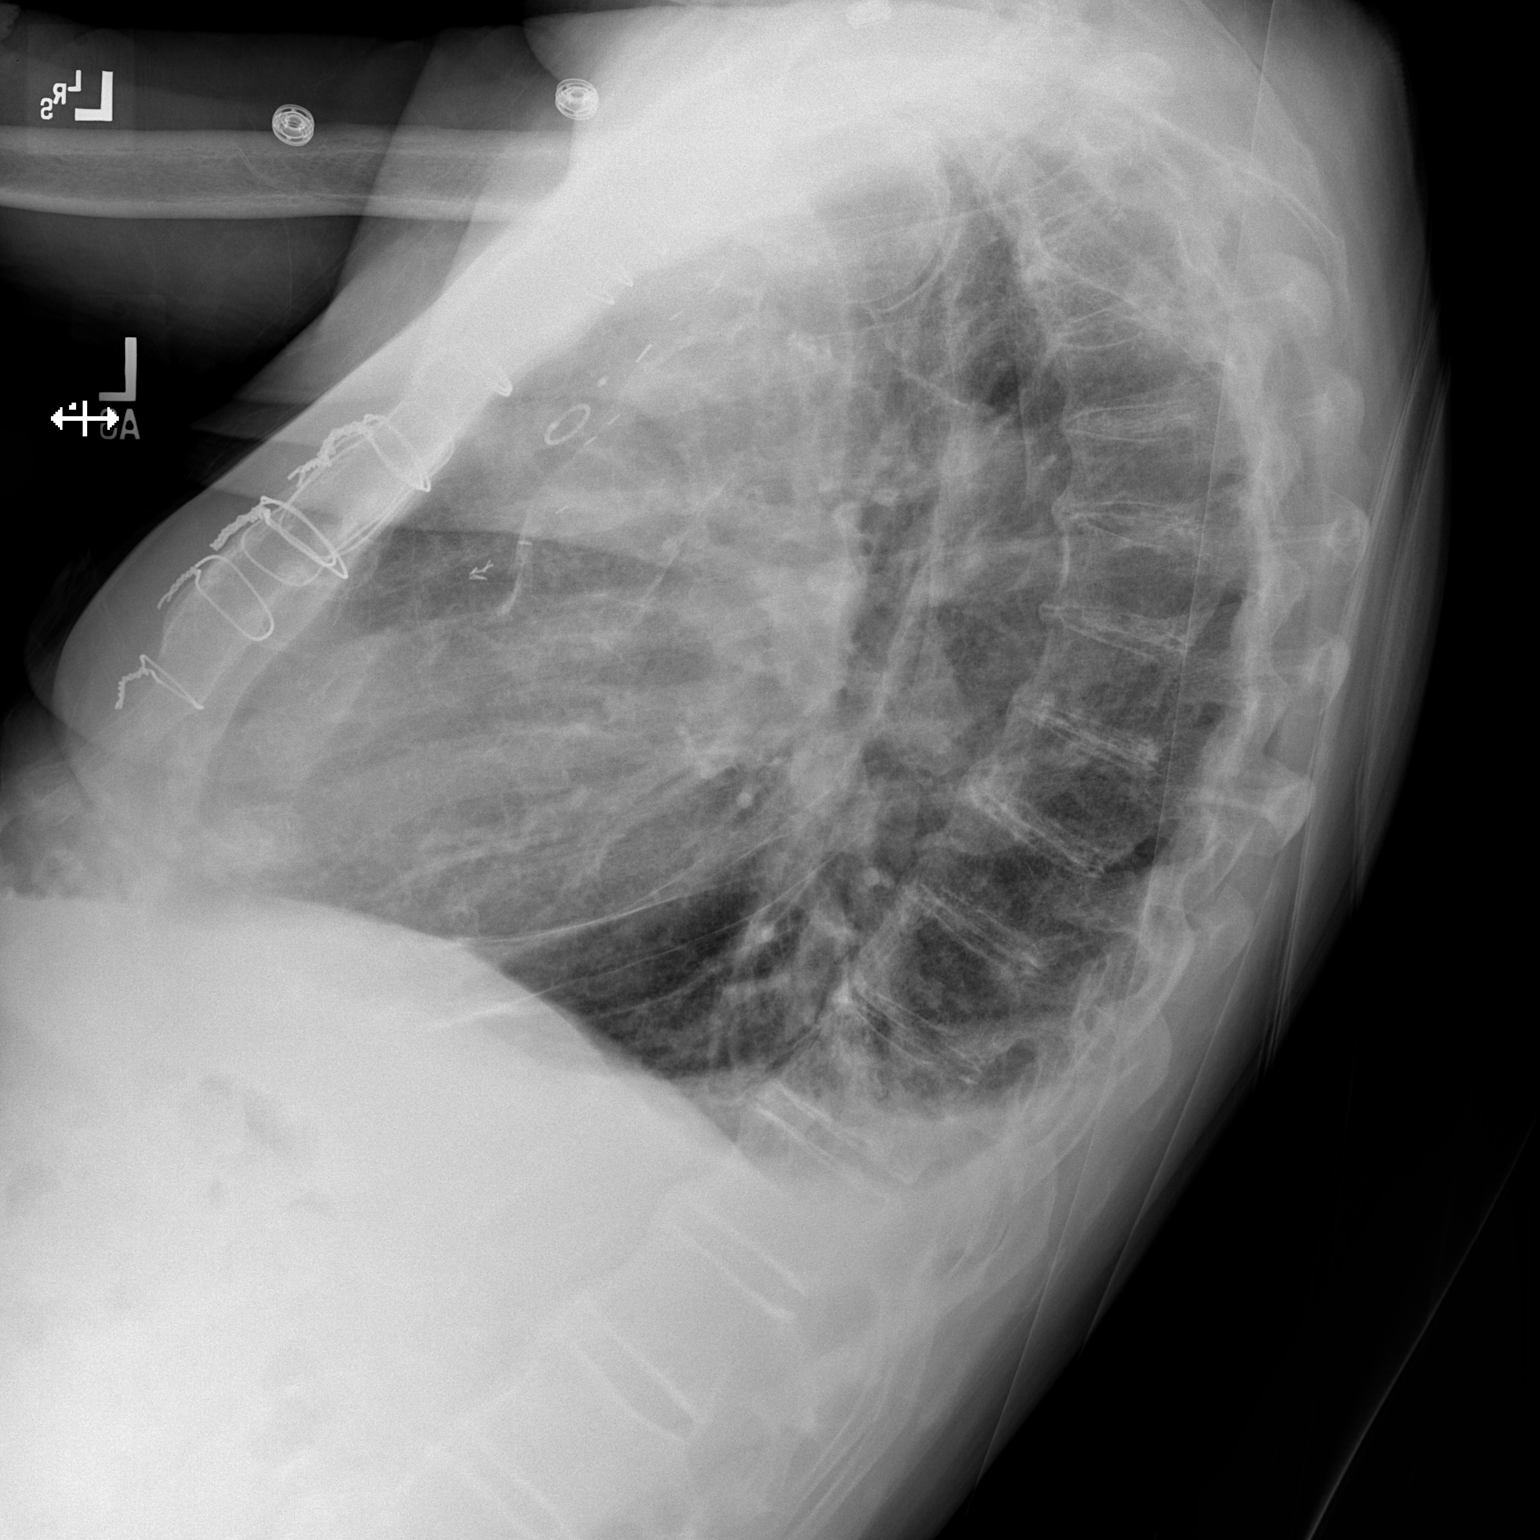

[x chest ap]
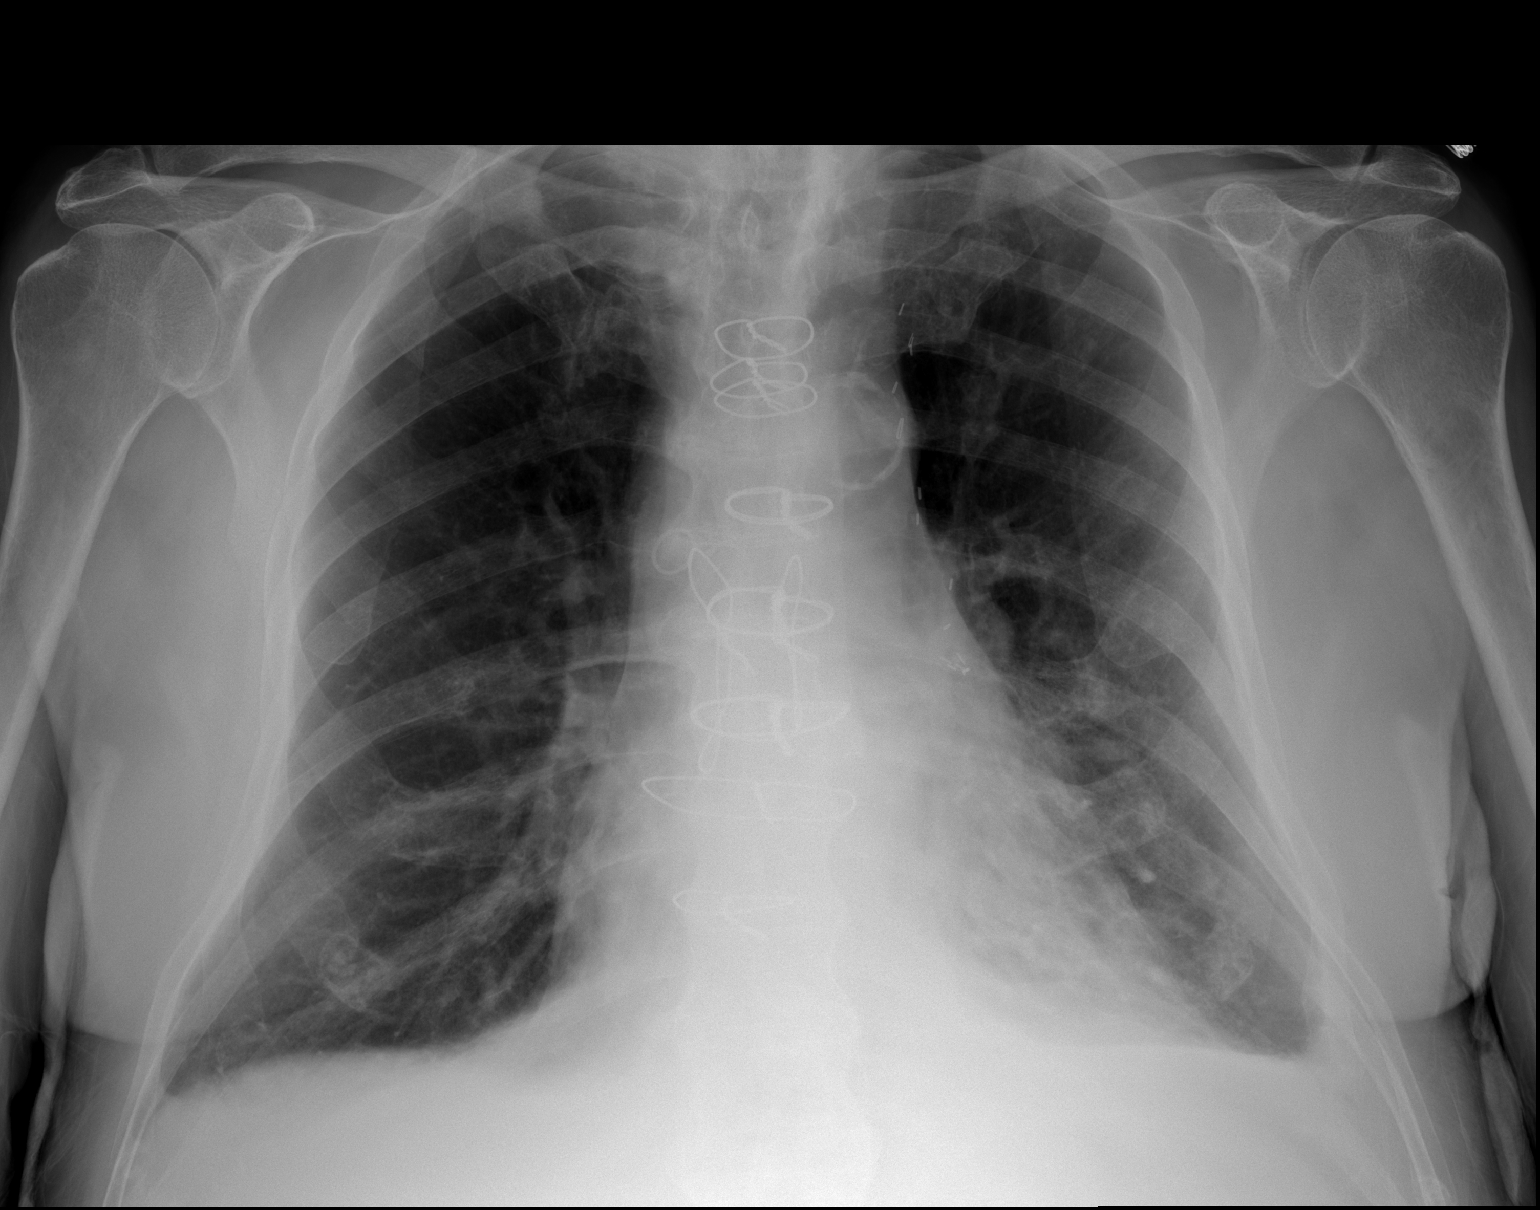

[2 of 2 positions shown; findings below may reference images not displayed]

FINDINGS: Postoperative changes in the mediastinum. Normal heart size and
pulmonary vascularity. Limited visualization of left hilar mass,
better seen on previous CT. Left hilum appears mildly prominent.
Infiltrate or atelectasis in the left lung base behind the heart.
Small bilateral pleural effusions. No pneumothorax. Calcified and
tortuous aorta.
IMPRESSION: Left hilar mass with probable postobstructive change in the left
lung base is better visualized on previous CT scan. Small bilateral
pleural effusions.

## 2019-12-15 IMAGING — MR MR HEAD WO/W CM
11 series · 48 of 48 positions shown · IV contrast (Multihance 15ml)
Comparison: 11/02/2016.  08/06/2016.

CLINICAL DATA: S RS restaging.  Left cerebellar mass.

EXAM:
MRI HEAD WITHOUT AND WITH CONTRAST
TECHNIQUE: Multiplanar, multiecho pulse sequences of the brain and surrounding
structures were obtained without and with intravenous contrast.
CONTRAST:  15mL MULTIHANCE GADOBENATE DIMEGLUMINE 529 MG/ML IV SOLN

[Series 3: FLAIR · sagittal · 3.0mm · 0.75mm/px · 2 of 39 slices shown (1 of 2)]
[im 1/39]
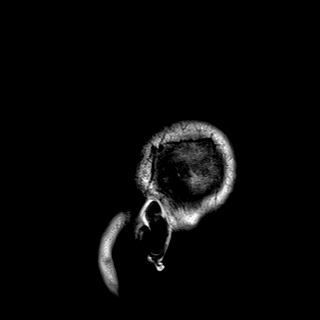
[im 39/39]
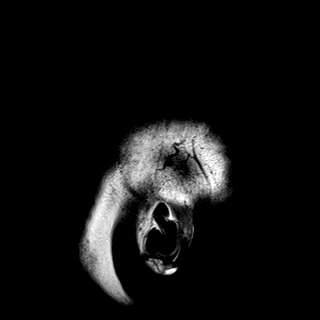

[Series 4: DWI · axial · 3.0mm · 1.50mm/px · z∈[-86,+62]mm · 5 of 78 slices shown (1 of 2)]
[im 1/78]
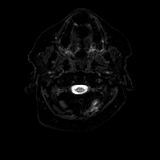
[im 20/78]
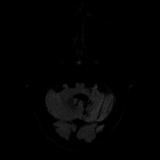
[im 39/78]
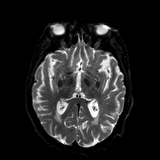
[im 58/78]
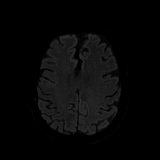
[im 78/78]
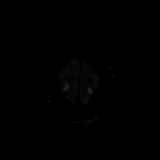

[Series 5: DWI · axial · 3.0mm · 1.50mm/px · z∈[-86,+62]mm · 3 of 39 slices shown (2 of 2)]
[im 1/39]
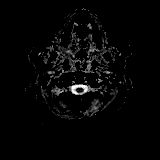
[im 20/39]
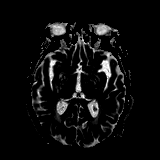
[im 39/39]
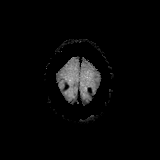

[Series 6: T2 · axial · 5.0mm · 0.57mm/px · z∈[-90,+66]mm · 2 of 27 slices shown]
[im 1/27]
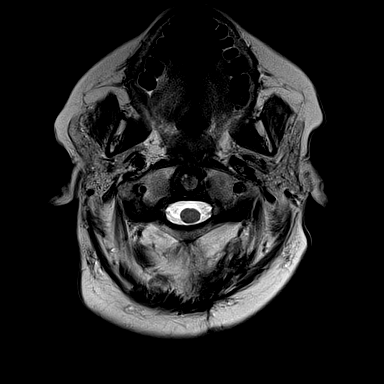
[im 27/27]
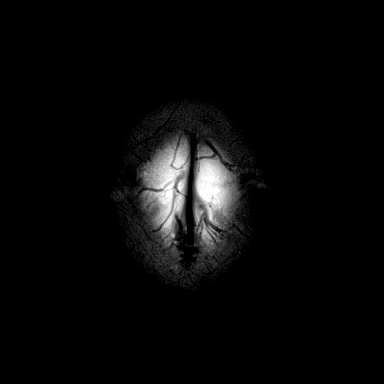

[Series 7: GRE · axial · 5.0mm · 0.57mm/px · z∈[-90,+66]mm · 2 of 27 slices shown]
[im 1/27]
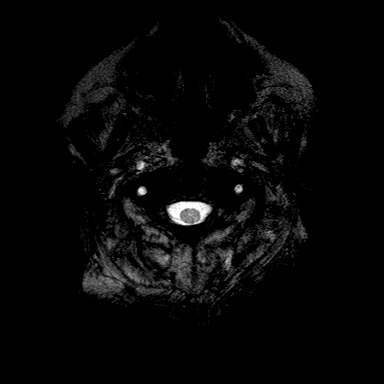
[im 27/27]
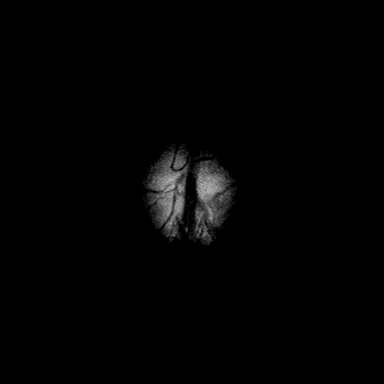

[Series 8: FLAIR · axial · 3.0mm · 0.57mm/px · z∈[-90,+63]mm · 3 of 52 slices shown (2 of 2)]
[im 1/52]
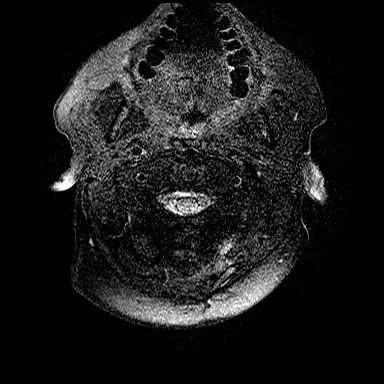
[im 26/52]
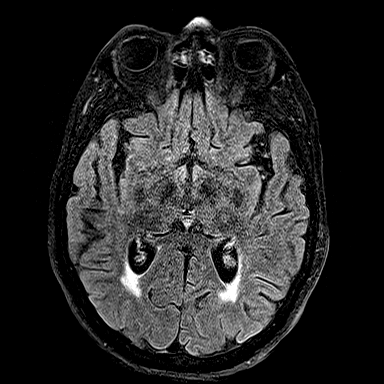
[im 52/52]
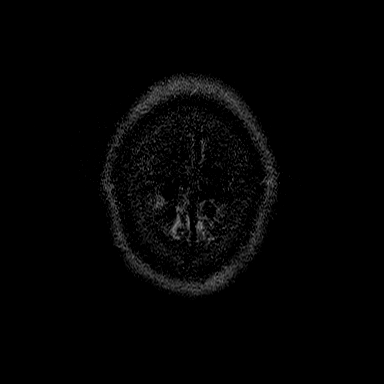

[Series 9: T1 · axial · 1.0mm · 0.75mm/px · z∈[-93,+66]mm · 11 of 160 slices shown]
[im 1/160]
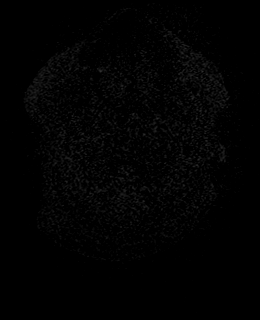
[im 16/160]
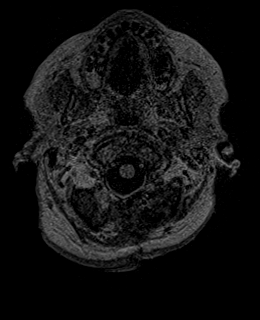
[im 32/160]
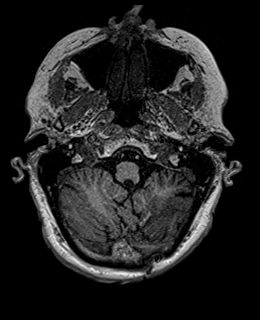
[im 48/160]
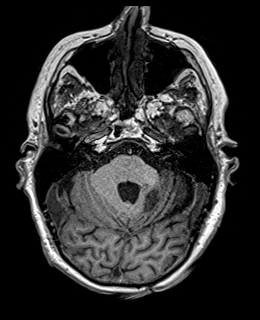
[im 64/160]
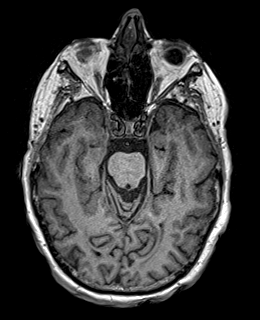
[im 80/160]
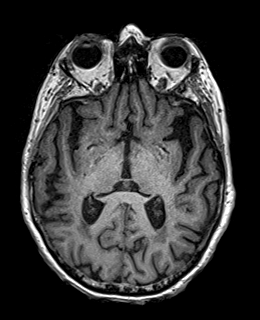
[im 96/160]
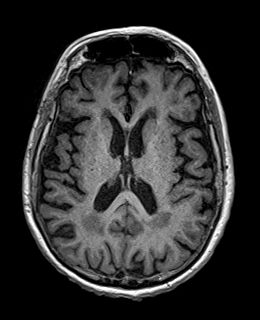
[im 112/160]
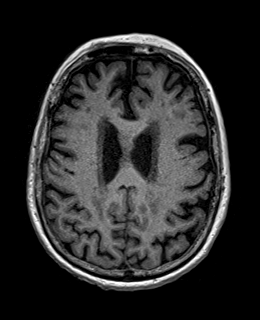
[im 128/160]
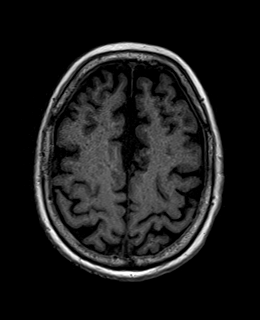
[im 144/160]
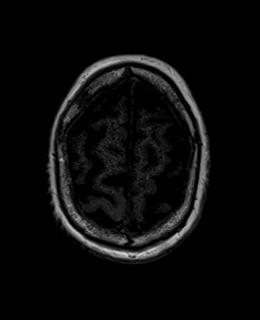
[im 160/160]
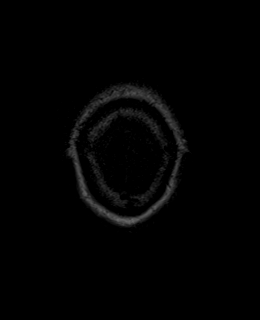

[Series 10: T2 post-contrast · coronal · 3.0mm · 0.57mm/px · 3 of 47 slices shown]
[im 1/47]
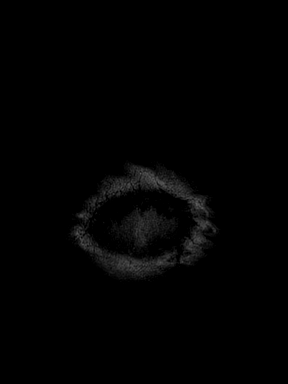
[im 24/47]
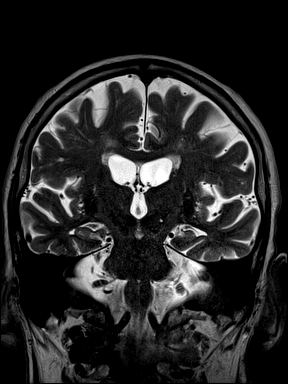
[im 47/47]
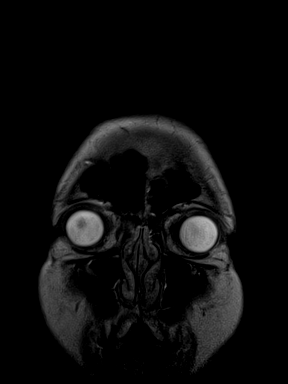

[Series 11: T1 post-contrast · axial · 1.0mm · 0.75mm/px · z∈[-93,+66]mm · 11 of 160 slices shown (1 of 2)]
[im 1/160]
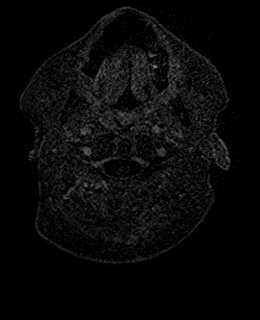
[im 16/160]
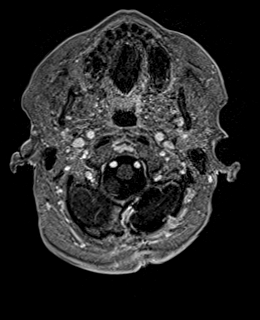
[im 32/160]
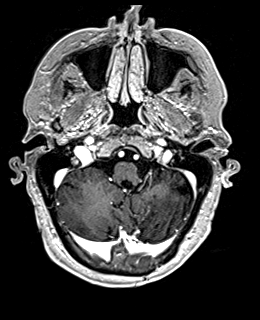
[im 48/160]
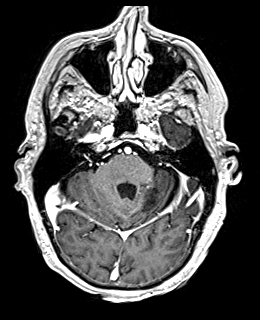
[im 64/160]
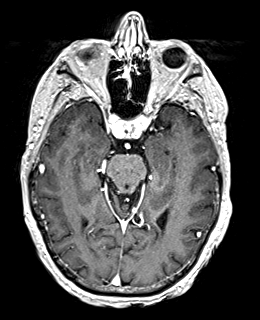
[im 80/160]
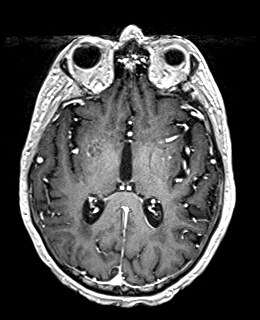
[im 96/160]
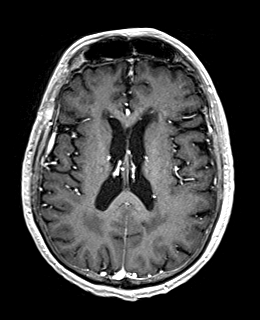
[im 112/160]
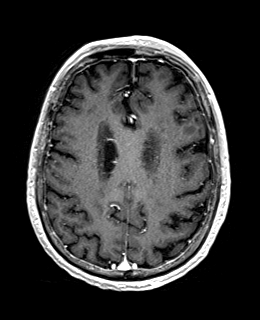
[im 128/160]
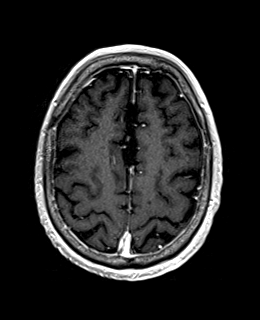
[im 144/160]
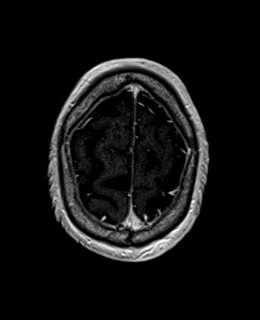
[im 160/160]
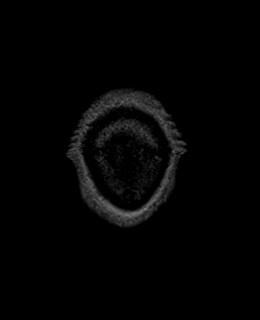

[Series 12: T1 post-contrast · coronal · 3.0mm · 0.57mm/px · 3 of 47 slices shown (2 of 2)]
[im 1/47]
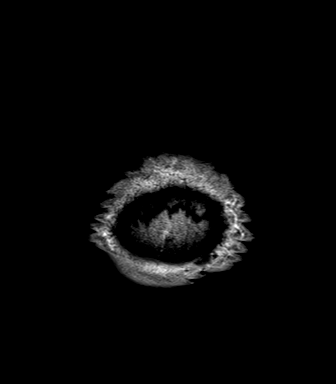
[im 24/47]
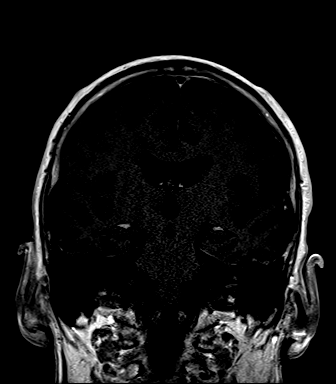
[im 47/47]
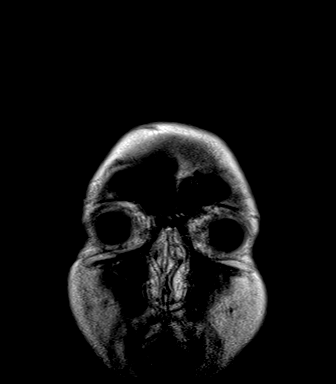

[Series 13: FLAIR post-contrast · sagittal · 3.0mm · 0.75mm/px · 3 of 39 slices shown]
[im 1/39]
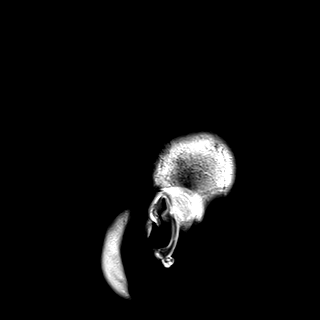
[im 20/39]
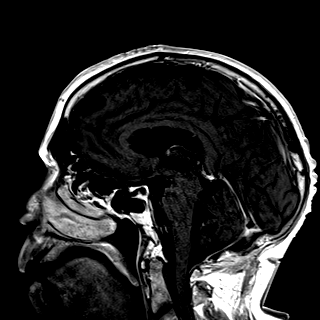
[im 39/39]
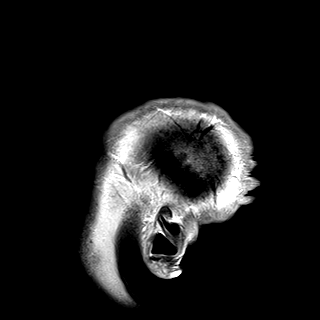

[48 of 48 positions shown; findings below may reference images not displayed]

FINDINGS: Brain: No foci of restricted diffusion. Cerebral hemispheres again
show moderate chronic small-vessel ischemic changes. No large vessel
territory insult. In the posterior fossa, the patient has had
left-sided craniotomy and partial resection of the left cerebellum.
There has been successful interval treatment of the large left
cerebellar lesion. There is atrophy and gliosis in the left
cerebellar hemisphere. Large tumor mass is completely gone. There
are 2 punctate foci of enhancement on the left at the middle
cerebellar peduncle and medial left cerebellar hemisphere. No mass
effect. No sign of vasogenic edema. Residual hemosiderin related to
previous treatment. No new lesion elsewhere.

Vascular: Major vessels at the base of the brain show flow.

Skull and upper cervical spine: Otherwise negative

Sinuses/Orbits: Clear/normal

Other: None
IMPRESSION: Excellent post treatment appearance. Large left cerebellar tumor
mass completely inapparent presently. Atrophy and gliosis of the
left cerebellar hemisphere. Two punctate foci of enhancement, 1 in
the left middle cerebellar peduncle in the other in the medial
inferior cerebellar hemisphere which may relate to previous
treatment and can be followed on subsequent examinations.
